# Patient Record
Sex: Female | Born: 1935 | Race: White | Hispanic: No | Smoking: Never smoker
Health system: Southern US, Community
[De-identification: ages and names within clinical notes are randomized; demographics above are authoritative.]

## PROBLEM LIST (undated history)

## (undated) ENCOUNTER — Emergency Department (HOSPITAL_COMMUNITY): Payer: Medicare Other

## (undated) DIAGNOSIS — E871 Hypo-osmolality and hyponatremia: Secondary | ICD-10-CM

## (undated) DIAGNOSIS — D649 Anemia, unspecified: Secondary | ICD-10-CM

## (undated) DIAGNOSIS — E039 Hypothyroidism, unspecified: Secondary | ICD-10-CM

## (undated) DIAGNOSIS — I10 Essential (primary) hypertension: Secondary | ICD-10-CM

## (undated) DIAGNOSIS — I480 Paroxysmal atrial fibrillation: Secondary | ICD-10-CM

## (undated) DIAGNOSIS — D696 Thrombocytopenia, unspecified: Secondary | ICD-10-CM

## (undated) DIAGNOSIS — I517 Cardiomegaly: Secondary | ICD-10-CM

## (undated) DIAGNOSIS — B029 Zoster without complications: Secondary | ICD-10-CM

## (undated) DIAGNOSIS — C801 Malignant (primary) neoplasm, unspecified: Secondary | ICD-10-CM

## (undated) HISTORY — DX: Zoster without complications: B02.9

## (undated) HISTORY — DX: Hypo-osmolality and hyponatremia: E87.1

## (undated) HISTORY — PX: JOINT REPLACEMENT: SHX530

## (undated) HISTORY — DX: Thrombocytopenia, unspecified: D69.6

## (undated) HISTORY — DX: Paroxysmal atrial fibrillation: I48.0

## (undated) HISTORY — PX: ABDOMINAL HYSTERECTOMY: SHX81

## (undated) HISTORY — DX: Hypothyroidism, unspecified: E03.9

## (undated) HISTORY — DX: Cardiomegaly: I51.7

## (undated) HISTORY — DX: Anemia, unspecified: D64.9

---

## 2000-04-12 ENCOUNTER — Inpatient Hospital Stay (HOSPITAL_COMMUNITY): Admission: RE | Admit: 2000-04-12 | Discharge: 2000-04-16 | Payer: Self-pay | Admitting: Orthopedic Surgery

## 2000-10-08 ENCOUNTER — Other Ambulatory Visit: Admission: RE | Admit: 2000-10-08 | Discharge: 2000-10-08 | Payer: Self-pay | Admitting: *Deleted

## 2004-01-22 ENCOUNTER — Ambulatory Visit (HOSPITAL_COMMUNITY): Admission: RE | Admit: 2004-01-22 | Discharge: 2004-01-22 | Payer: Self-pay | Admitting: Gastroenterology

## 2010-01-20 ENCOUNTER — Inpatient Hospital Stay (HOSPITAL_COMMUNITY): Admission: RE | Admit: 2010-01-20 | Discharge: 2010-01-24 | Payer: Self-pay | Admitting: Orthopedic Surgery

## 2010-12-30 LAB — BASIC METABOLIC PANEL
CO2: 27 mEq/L (ref 19–32)
Chloride: 94 mEq/L — ABNORMAL LOW (ref 96–112)
GFR calc non Af Amer: >60 mL/min (ref >60)
Glucose, Bld: 110 mg/dL — ABNORMAL HIGH (ref 70–99)
Potassium: 3.8 mEq/L (ref 3.5–5.1)
Sodium: 128 mEq/L — ABNORMAL LOW (ref 135–145)

## 2010-12-30 LAB — CBC
HCT: 33.5 % — ABNORMAL LOW (ref 36.0–46.0)
Hemoglobin: 11.6 g/dL — ABNORMAL LOW (ref 12.0–15.0)
MCV: 95.9 fL (ref 78.0–100.0)
RDW: 14.6 % (ref 11.5–15.5)

## 2010-12-31 LAB — BASIC METABOLIC PANEL
BUN: 9 mg/dL (ref 6–23)
CO2: 26 mEq/L (ref 19–32)
Calcium: 8 mg/dL — ABNORMAL LOW (ref 8.4–10.5)
Calcium: 8.5 mg/dL (ref 8.4–10.5)
Chloride: 87 mEq/L — ABNORMAL LOW (ref 96–112)
GFR calc Af Amer: >60 mL/min (ref >60)
GFR calc Af Amer: >60 mL/min (ref >60)
GFR calc non Af Amer: >60 mL/min (ref >60)
GFR calc non Af Amer: >60 mL/min (ref >60)
GFR calc non Af Amer: >60 mL/min (ref >60)
Potassium: 3.1 mEq/L — ABNORMAL LOW (ref 3.5–5.1)
Potassium: 3.8 mEq/L (ref 3.5–5.1)
Sodium: 124 mEq/L — ABNORMAL LOW (ref 135–145)
Sodium: 128 mEq/L — ABNORMAL LOW (ref 135–145)
Sodium: 132 mEq/L — ABNORMAL LOW (ref 135–145)

## 2010-12-31 LAB — URINALYSIS, ROUTINE W REFLEX MICROSCOPIC
Bilirubin Urine: NEGATIVE
Ketones, ur: NEGATIVE mg/dL
Nitrite: NEGATIVE
Protein, ur: NEGATIVE mg/dL
Urobilinogen, UA: 0.2 mg/dL (ref 0.0–1.0)

## 2010-12-31 LAB — COMPREHENSIVE METABOLIC PANEL
Albumin: 4.1 g/dL (ref 3.5–5.2)
BUN: 12 mg/dL (ref 6–23)
CO2: 25 mEq/L (ref 19–32)
Chloride: 98 mEq/L (ref 96–112)
Creatinine, Ser: 0.7 mg/dL (ref 0.4–1.2)
GFR calc non Af Amer: >60 mL/min (ref >60)
Total Bilirubin: 0.3 mg/dL (ref 0.3–1.2)

## 2010-12-31 LAB — CBC
HCT: 29.9 % — ABNORMAL LOW (ref 36.0–46.0)
HCT: 32.3 % — ABNORMAL LOW (ref 36.0–46.0)
HCT: 36.7 % (ref 36.0–46.0)
Hemoglobin: 10.1 g/dL — ABNORMAL LOW (ref 12.0–15.0)
Hemoglobin: 11.2 g/dL — ABNORMAL LOW (ref 12.0–15.0)
Hemoglobin: 8.3 g/dL — ABNORMAL LOW (ref 12.0–15.0)
MCHC: 34.3 g/dL (ref 30.0–36.0)
MCV: 94.7 fL (ref 78.0–100.0)
MCV: 98.6 fL (ref 78.0–100.0)
Platelets: 353 10*3/uL (ref 150–400)
Platelets: 435 10*3/uL — ABNORMAL HIGH (ref 150–400)
RBC: 2.48 MIL/uL — ABNORMAL LOW (ref 3.87–5.11)
RBC: 3.41 MIL/uL — ABNORMAL LOW (ref 3.87–5.11)
WBC: 10.7 10*3/uL — ABNORMAL HIGH (ref 4.0–10.5)
WBC: 13.6 10*3/uL — ABNORMAL HIGH (ref 4.0–10.5)
WBC: 15.2 10*3/uL — ABNORMAL HIGH (ref 4.0–10.5)
WBC: 16.3 10*3/uL — ABNORMAL HIGH (ref 4.0–10.5)

## 2010-12-31 LAB — TYPE AND SCREEN: Antibody Screen: NEGATIVE

## 2010-12-31 LAB — ABO/RH: ABO/RH(D): A POS

## 2010-12-31 LAB — PROTIME-INR
INR: 1.05 (ref 0.00–1.49)
INR: 1.12 (ref 0.00–1.49)
INR: 2.12 — ABNORMAL HIGH (ref 0.00–1.49)

## 2010-12-31 LAB — APTT: aPTT: 30 seconds (ref 24–37)

## 2011-02-12 ENCOUNTER — Inpatient Hospital Stay (HOSPITAL_COMMUNITY)
Admission: EM | Admit: 2011-02-12 | Discharge: 2011-02-17 | DRG: 372 | Disposition: A | Discharge status: Home / Self Care | Payer: Medicare Other | Attending: Internal Medicine | Admitting: Internal Medicine

## 2011-02-12 ENCOUNTER — Encounter (HOSPITAL_COMMUNITY): Payer: Self-pay | Admitting: Radiology

## 2011-02-12 ENCOUNTER — Emergency Department (HOSPITAL_COMMUNITY): Payer: Medicare Other

## 2011-02-12 ENCOUNTER — Inpatient Hospital Stay (HOSPITAL_COMMUNITY): Payer: Medicare Other

## 2011-02-12 DIAGNOSIS — E871 Hypo-osmolality and hyponatremia: Secondary | ICD-10-CM | POA: Diagnosis present

## 2011-02-12 DIAGNOSIS — E785 Hyperlipidemia, unspecified: Secondary | ICD-10-CM | POA: Diagnosis present

## 2011-02-12 DIAGNOSIS — E039 Hypothyroidism, unspecified: Secondary | ICD-10-CM | POA: Diagnosis present

## 2011-02-12 DIAGNOSIS — K651 Peritoneal abscess: Principal | ICD-10-CM | POA: Diagnosis present

## 2011-02-12 DIAGNOSIS — Z7982 Long term (current) use of aspirin: Secondary | ICD-10-CM

## 2011-02-12 DIAGNOSIS — Z79899 Other long term (current) drug therapy: Secondary | ICD-10-CM

## 2011-02-12 DIAGNOSIS — M810 Age-related osteoporosis without current pathological fracture: Secondary | ICD-10-CM | POA: Diagnosis present

## 2011-02-12 DIAGNOSIS — D509 Iron deficiency anemia, unspecified: Secondary | ICD-10-CM | POA: Diagnosis not present

## 2011-02-12 DIAGNOSIS — M1990 Unspecified osteoarthritis, unspecified site: Secondary | ICD-10-CM | POA: Diagnosis present

## 2011-02-12 DIAGNOSIS — Z96659 Presence of unspecified artificial knee joint: Secondary | ICD-10-CM

## 2011-02-12 DIAGNOSIS — Z8542 Personal history of malignant neoplasm of other parts of uterus: Secondary | ICD-10-CM

## 2011-02-12 DIAGNOSIS — I10 Essential (primary) hypertension: Secondary | ICD-10-CM | POA: Diagnosis present

## 2011-02-12 DIAGNOSIS — I4891 Unspecified atrial fibrillation: Secondary | ICD-10-CM | POA: Diagnosis present

## 2011-02-12 DIAGNOSIS — IMO0002 Reserved for concepts with insufficient information to code with codable children: Secondary | ICD-10-CM | POA: Diagnosis present

## 2011-02-12 DIAGNOSIS — E876 Hypokalemia: Secondary | ICD-10-CM | POA: Diagnosis present

## 2011-02-12 DIAGNOSIS — E869 Volume depletion, unspecified: Secondary | ICD-10-CM | POA: Diagnosis present

## 2011-02-12 HISTORY — DX: Malignant (primary) neoplasm, unspecified: C80.1

## 2011-02-12 HISTORY — DX: Essential (primary) hypertension: I10

## 2011-02-12 LAB — BASIC METABOLIC PANEL
BUN: 14 mg/dL (ref 6–23)
Chloride: 94 mEq/L — ABNORMAL LOW (ref 96–112)
GFR calc non Af Amer: >60 mL/min (ref >60)
Potassium: 3.1 mEq/L — ABNORMAL LOW (ref 3.5–5.1)
Sodium: 132 mEq/L — ABNORMAL LOW (ref 135–145)

## 2011-02-12 LAB — DIFFERENTIAL
Lymphocytes Relative: 27 % (ref 12–46)
Monocytes Absolute: 1.3 10*3/uL — ABNORMAL HIGH (ref 0.1–1.0)
Monocytes Relative: 8 % (ref 3–12)
Neutro Abs: 9.3 10*3/uL — ABNORMAL HIGH (ref 1.7–7.7)
Neutrophils Relative %: 62 % (ref 43–77)

## 2011-02-12 LAB — MAGNESIUM: Magnesium: 2.4 mg/dL (ref 1.5–2.5)

## 2011-02-12 LAB — COMPREHENSIVE METABOLIC PANEL
ALT: 32 U/L (ref 0–35)
AST: 33 U/L (ref 0–37)
Albumin: 3.3 g/dL — ABNORMAL LOW (ref 3.5–5.2)
CO2: 26 mEq/L (ref 19–32)
Calcium: 10 mg/dL (ref 8.4–10.5)
Chloride: 82 mEq/L — ABNORMAL LOW (ref 96–112)
Creatinine, Ser: 0.73 mg/dL (ref 0.4–1.2)
GFR calc Af Amer: >60 mL/min (ref >60)
GFR calc non Af Amer: >60 mL/min (ref >60)
Sodium: 129 mEq/L — ABNORMAL LOW (ref 135–145)

## 2011-02-12 LAB — CBC
HCT: 39.7 % (ref 36.0–46.0)
Hemoglobin: 14.1 g/dL (ref 12.0–15.0)
MCH: 32.8 pg (ref 26.0–34.0)
MCHC: 35.5 g/dL (ref 30.0–36.0)
RBC: 4.3 MIL/uL (ref 3.87–5.11)

## 2011-02-12 LAB — POCT CARDIAC MARKERS
Myoglobin, poc: 252 ng/mL (ref 12–200)
Troponin i, poc: <0.05 ng/mL (ref 0.00–0.09)

## 2011-02-12 LAB — TROPONIN I: Troponin I: 0.38 ng/mL (ref <0.30)

## 2011-02-12 LAB — CK TOTAL AND CKMB (NOT AT ARMC): CK, MB: 5.1 ng/mL — ABNORMAL HIGH (ref 0.3–4.0)

## 2011-02-12 LAB — TSH: TSH: 1.006 u[IU]/mL (ref 0.350–4.500)

## 2011-02-12 MED ORDER — IOHEXOL 300 MG/ML  SOLN
100.0000 mL | Freq: Once | INTRAMUSCULAR | Status: AC | PRN
  Administered 2011-02-12: 100 mL via INTRAVENOUS

## 2011-02-13 DIAGNOSIS — I517 Cardiomegaly: Secondary | ICD-10-CM

## 2011-02-13 LAB — CBC
HCT: 29.6 % — ABNORMAL LOW (ref 36.0–46.0)
MCH: 32.5 pg (ref 26.0–34.0)
MCHC: 34.5 g/dL (ref 30.0–36.0)
MCV: 94.3 fL (ref 78.0–100.0)
MCV: 94.5 fL (ref 78.0–100.0)
Platelets: 631 10*3/uL — ABNORMAL HIGH (ref 150–400)
Platelets: 694 10*3/uL — ABNORMAL HIGH (ref 150–400)
RBC: 3.27 MIL/uL — ABNORMAL LOW (ref 3.87–5.11)
RDW: 12.6 % (ref 11.5–15.5)
RDW: 12.4 % (ref 11.5–15.5)
WBC: 12.3 10*3/uL — ABNORMAL HIGH (ref 4.0–10.5)
WBC: 12.2 10*3/uL — ABNORMAL HIGH (ref 4.0–10.5)

## 2011-02-13 LAB — BASIC METABOLIC PANEL
BUN: 5 mg/dL — ABNORMAL LOW (ref 6–23)
Chloride: 97 mEq/L (ref 96–112)
Creatinine, Ser: <0.47 mg/dL (ref 0.4–1.2)
Potassium: 3.9 mEq/L (ref 3.5–5.1)

## 2011-02-13 LAB — COMPREHENSIVE METABOLIC PANEL
ALT: 19 U/L (ref 0–35)
Alkaline Phosphatase: 57 U/L (ref 39–117)
BUN: 7 mg/dL (ref 6–23)
CO2: 26 mEq/L (ref 19–32)
Chloride: 95 mEq/L — ABNORMAL LOW (ref 96–112)
Glucose, Bld: 112 mg/dL — ABNORMAL HIGH (ref 70–99)
Potassium: 2.8 mEq/L — ABNORMAL LOW (ref 3.5–5.1)
Sodium: 132 mEq/L — ABNORMAL LOW (ref 135–145)
Total Bilirubin: 0.2 mg/dL — ABNORMAL LOW (ref 0.3–1.2)
Total Protein: 5.5 g/dL — ABNORMAL LOW (ref 6.0–8.3)

## 2011-02-13 LAB — CARDIAC PANEL(CRET KIN+CKTOT+MB+TROPI)
Relative Index: INVALID (ref 0.0–2.5)
Troponin I: 0.34 ng/mL (ref <0.30)

## 2011-02-14 LAB — CBC
HCT: 34.4 % — ABNORMAL LOW (ref 36.0–46.0)
HCT: 32.5 % — ABNORMAL LOW (ref 36.0–46.0)
Hemoglobin: 11 g/dL — ABNORMAL LOW (ref 12.0–15.0)
MCV: 94.8 fL (ref 78.0–100.0)
Platelets: 717 10*3/uL — ABNORMAL HIGH (ref 150–400)
RBC: 3.65 MIL/uL — ABNORMAL LOW (ref 3.87–5.11)
RDW: 12.5 % (ref 11.5–15.5)
WBC: 18.9 10*3/uL — ABNORMAL HIGH (ref 4.0–10.5)
WBC: 14 10*3/uL — ABNORMAL HIGH (ref 4.0–10.5)

## 2011-02-14 LAB — BASIC METABOLIC PANEL
Chloride: 99 mEq/L (ref 96–112)
Potassium: 4 mEq/L (ref 3.5–5.1)
Sodium: 131 mEq/L — ABNORMAL LOW (ref 135–145)

## 2011-02-15 LAB — CBC
HCT: 33.2 % — ABNORMAL LOW (ref 36.0–46.0)
HCT: 32.3 % — ABNORMAL LOW (ref 36.0–46.0)
Hemoglobin: 11.5 g/dL — ABNORMAL LOW (ref 12.0–15.0)
MCH: 32.8 pg (ref 26.0–34.0)
MCH: 32.6 pg (ref 26.0–34.0)
MCHC: 34.4 g/dL (ref 30.0–36.0)
MCV: 94.6 fL (ref 78.0–100.0)
MCV: 94.7 fL (ref 78.0–100.0)
RBC: 3.51 MIL/uL — ABNORMAL LOW (ref 3.87–5.11)
RDW: 12.5 % (ref 11.5–15.5)
WBC: 13.6 10*3/uL — ABNORMAL HIGH (ref 4.0–10.5)

## 2011-02-16 LAB — HEMOCCULT GUIAC POC 1CARD (OFFICE): Fecal Occult Bld: NEGATIVE

## 2011-02-16 LAB — APTT: aPTT: 36 seconds (ref 24–37)

## 2011-02-17 ENCOUNTER — Inpatient Hospital Stay (HOSPITAL_COMMUNITY): Payer: Medicare Other

## 2011-02-17 LAB — CBC
Hemoglobin: 11.6 g/dL — ABNORMAL LOW (ref 12.0–15.0)
MCH: 32.2 pg (ref 26.0–34.0)
MCHC: 34.2 g/dL (ref 30.0–36.0)
Platelets: 547 10*3/uL — ABNORMAL HIGH (ref 150–400)
RDW: 12.6 % (ref 11.5–15.5)

## 2011-02-17 LAB — BASIC METABOLIC PANEL
Calcium: 8.8 mg/dL (ref 8.4–10.5)
Creatinine, Ser: <0.47 mg/dL (ref 0.4–1.2)
Sodium: 126 mEq/L — ABNORMAL LOW (ref 135–145)

## 2011-02-19 NOTE — Consult Note (Signed)
NAME:  Dominique [name]                 ACCOUNT NO.:  [id number]  MEDICAL RECORD NO.:  [id number]           PATIENT TYPE:  LOCATION:                                 FACILITY:  PHYSICIAN:  Wilmon Arms. Corliss Skains, M.D. DATE OF BIRTH:  September 14, 1933  DATE OF CONSULTATION:  02/12/2011 DATE OF DISCHARGE:                                CONSULTATION   REQUESTING PHYSICIAN:  Larina Earthly, MD  REASON FOR CONSULTATION:  Possible pelvic abscess.  HISTORY OF PRESENT ILLNESS:  [name] is a pleasant 77 year old female has had some lower abdominal discomfort and intermittent bouts of diarrhea and constipation over the past 2-3 weeks.  She has had no fever, no changes in her appetite.  She states there has been no blood in her bowel movements.  She denies any pneumaturia.  She denies any vaginal discharge.  She was set up for a CT scan as an outpatient and this apparently showed suggestion of a pelvic abscess, possibly related to diverticular disease.  She had a previous colonoscopy I believe in 2006 that showed no evidence of diverticular process.  Dr. Felipa Eth apparently discussed this with Dr. Michaell Cowing who helped arrange an outpatient percutaneous drainage.  However, the patient states on the way to the hospital for that procedure she developed acute onset of chest pain, jaw pain, and rapid heart rate with palpitations.  She was sent from radiology department to the emergency department and was found to be in atrial fibrillation with rapid ventricular response which was new onset.  She was seen in the ER and treated for that.  We were asked to consult on the patient based on her previous CT scan findings of a possible pelvic abscess.  However, upon review with the radiology team of the initial CT there was concern that this does not appear to be an inflammatory process or definitive abscess and therefore further imaging would be necessary.  PAST MEDICAL HISTORY: 1. Hyperlipidemia. 2. Osteoarthritis. 3.  Hypertension. 4. History of uterine cancer. 5. History of questionable thyroid disease.  SURGICAL HISTORY:  The patient has had a total abdominal hysterectomy and BSO for her previously mentioned uterine cancer.  She has had bilateral total knee arthroplasty as well in the past.  FAMILY HISTORY:  Noncontributory at present case.  SOCIAL HISTORY:  The patient is married, living at home.  She denies any alcohol, tobacco, or illicit drug use.  As listed, allergies include CODEINE, DARVOCET, PERCOCET, and DEMEROL.  REVIEW OF SYSTEMS:  Please see history of present illness for pertinent findings.  PHYSICAL EXAMINATION:  GENERAL:  A 77 year old female who does not appear in any acute distress at present time. CURRENT VITAL SIGNS:  Temperature of 97.8, heart rate anywhere from 77 and presenting heart rate of 180 and regular.  Blood pressure 118/59, respiratory rate of 18. ENT:  Unremarkable. NECK:  Supple without lymphadenopathy.  Trachea is midline.  No thyromegaly or masses. LUNGS:  Clear. HEART:  Irregular rate and rhythm but no murmurs, gallops, rubs. Carotids 2+ and brisk without bruit.  Peripheral pulses intact and symmetrical. ABDOMEN:  The patient has normal bowel sounds.  She is nondistended. She is tender in the suprapubic region but no mass effect is appreciated. RECTAL:  Deferred at present time.  The patient is alert and oriented.  DIAGNOSTICS:  CBC shows a white blood cell count of 15.1, hemoglobin of 14.1, hematocrit of 39.7, platelet count of 808.  Metabolic panel shows a sodium of 129, potassium of 2.2, chloride of 82, CO2 of 25, BUN of 15, creatinine of 0.7.  Liver enzymes within normal limits.  IMAGING:  Chest x-ray shows no acute disease process.  No obvious pneumoperitoneum is evident.  A CT scan with IV and rectal contrast has been ordered but is yet to be performed.  IMPRESSION: 1. New onset atrial fibrillation. 2. Chest pain. 3. Abdominal pain of  uncertain etiology. 4. Questionable inflammatory process in the pelvis pending CT. 5. History of uterine cancer.  PLAN:  We will agree to follow the CT scan report for the possibility of inflammatory or abscess process in the pelvis.  Agree with current IV antibiotics.  We would recommend bowel rest until definitive diagnosis is achieved.  We will happily follow along with you.     Brayton El, PA-C   ______________________________ Wilmon Arms. Corliss Skains, M.D.    KB/MEDQ  D:  02/13/2011  T:  02/13/2011  Job:  [id number]  Electronically Signed by Brayton El  on 02/16/2011 04:00:15 PM Electronically Signed by Manus Rudd M.D. on 02/19/2011 08:20:03 AM

## 2011-02-27 NOTE — Op Note (Signed)
[facility]  Patient:    Dominique [name], Dominique [name]                        MRN: [id number] Proc. Date: 04/12/00 Adm. Date:  [id number] Attending:  Ollen Gross V                           Operative Report  PREOPERATIVE DIAGNOSIS:  Osteoarthritis, left knee.  POSTOPERATIVE DIAGNOSIS:  Osteoarthritis, left knee.  PROCEDURE:  Left total knee arthroplasty.  SURGEON:  Ollen Gross, M.D.  ASSISTANT:  Druscilla Brownie. Shela Nevin, P.A.  ANESTHESIA:  General.  ESTIMATED BLOOD LOSS:  Minimal.  DRAINS:  Hemovac x 1.  TOURNIQUET TIME:  Fifty-three minutes at 350 mmHg.  COMPLICATIONS:  None.  CONDITION:  Stable, to recovery.  BRIEF CLINICAL NOTE:  Ms. Starkel is a 77 year old female with severe osteoarthritis of her left knee who has had symptoms refractory to nonoperative management.  She presents now for left total knee arthroplasty.  PROCEDURE IN DETAIL:  After the successful administration of general anesthetic, a tourniquet is placed high on the left thigh and left lower extremity prepped and draped in the usual sterile fashion.  Extremity is wrapped with an Esmarch, knee flexed and tourniquet inflated to 350 mmHg.  A standard midline incision is made and skin cut with a 10 blade through subcutaneous tissue to the level of the extensor mechanism.  A fresh blade is used to make a medial parapatellar arthrotomy.  Soft tissue over the proximal medial tibia is subperiosteally elevated to the joint line with a knife and into semimembranosus bursa with curved osteotome.  Soft tissue over the proximal lateral tibia is also elevated with attention being paid to avoiding the patellar tendon on the tibial tubercle.  Lateral patellofemoral ligament is then cut, patella everted and the knee flexed to 90 degrees.  ACL and PCL are then removed.  The drill is then used to create a starting hole in the distal femur for placement of the alignment rod.  A 5 degree left valgus  alignment rod is placed and referencing off the posterior condyle, rotation is marked and the block is pinned so as to remove 9 mm off the distal femur.  Distal femur resection is then made and the sizing block placed.  Size 3 is the most appropriate.  The rotation is marked and it corresponds with the epicondylar axis.  Size 3 cutting block is placed and the anterior and posterior cuts are made.  The tibia is then subluxated forward and the menisci removed.  Extramedullary tibial alignment guide is placed and referencing proximally at the medial third tibial tubercle and then distally along the second metatarsal axis, the block is subsequently pinned so as to remove 10 mm off the nondeficient lateral side.  The intercondylar block is then placed on the femur and intercondylar and chamfer cuts made.  Trial femur and trial tibia, both size 3s, are then placed with a 10-mm posterior-stabilized rotating platform insert.  She had excellent stability in full extension all the way down past 100 degrees of flexion.  There was excellent balance throughout.  At this point, the patella is everted, thickness is measured to be 22 and freehand resection taken down to 12 mm.  Lug holes are then drilled for the 38-mm template, trials placed and she easily tracks well.  The proximal tibia is then prepared with the modular drill and Carolin Guernsey  punch and the osteophytes removed from the posterior femur.  At this point, the cut bone surfaces are prepared with pulsatile lavage and cement mixed.  Once ready for implantation, the tibia, the femur and patellar are cemented into place.  A trial 10-mm insert is placed, the knee held with full extension and all extruded cement removed.  The patella tracks normally once the components are fully cemented into place.  The permanent 10-mm posterior stabilized rotating platform insert is placed into the size 3 tibial tray and once again, there is excellent balance and stability  throughout.  The wound is then copiously irrigated with antibiotic solution and the extensor mechanism closed over one limb of a Hemovac drain.  Tourniquet is released with a total time of 53 minutes.  Subcu is closed in two layers with interrupted 2-0 Vicryl, subcuticular closed with running 4-0 Monocryl.  Incision is clean and dry and Steri-Strips and bulky sterile dressing applied.  Patient is then placed into a knee immobilizer, awakened and transported to recovery in stable condition.DD:  04/12/00 TD:  04/12/00 Job: [id number]

## 2011-02-27 NOTE — Discharge Summary (Signed)
[facility]  Patient:    Dominique [name], Dominique [name]                        MRN: [id number] Adm. Date:  [id number] Disc. Date: [id number] Attending:  Loanne Drilling Dictator:   Marveen Reeks Dasnoit, P.A.                           Discharge Summary  ADMISSION DIAGNOSES: 1.  End-stage osteoarthritic degeneration of the left knee. 2.  Hypertension. 3.  History of uterine cancer. 4.  Postmenopausal.  DISCHARGE DIAGNOSES: 1.  End-stage osteoarthritic degenerative of the left knee 2.  Hypertension. 3.  History of uterine cancer. 4.  Postmenopausal. 5.  Postoperative blood loss anemia, requiring transfusion.  OPERATION PERFORMED:  Left total knee replacement.  CONSULTATIONS:  None.  HISTORY:  The patient is a 77 year old female who has been seen and evaluated by Dr. Ollen Gross for continued left knee pain.  She was referred via the care of Dr. Janey Genta for consideration of left knee arthroplasty.  She has been having approximately a six- to eight-year history of left knee pain.  She has undergone conservative measures with analgesics, medications by mouth, intra-articular injections, including ______ as well as cortisone injections. But the pain has been refractory to these measures.  The pain is increasing to the point where it has started to interfere with her activities of daily living.  X-rays in our office revealed end-stage osteoarthritic degeneration throughout all three compartments.  We discussed her treatment options with her at length.  It was decided that she would be best served undergoing a left total knee replacement.  The procedure risks, benefits, complications and postoperative course were discussed with the patient at length.  She was in agreement with this plan.  LAB DATA:  Preoperative labs revealed a hemoglobin of 13.9 with a hematocrit of 41.8, white blood cell count of 11.5 and 608,000 platelets.  Coag studies within normal limits.   Chemistry profile was entirely normal.  Urinalysis revealed a trace of ketones, but was otherwise normal.  Preoperative EKG revealed a normal sinus rhythm with a normal axis.  HOSPITAL COURSE:  The patient was admitted after undergoing left total knee replacement by Dr. Ollen Gross.  This procedure was done under general anesthesia.  Tourniquet time of 53 minutes, minimal blood loss. Postoperatively, the patient was placed on Ancef for three doses.  Pain was controlled with morphine by PCA.  She was started on two-minute per the pharmacy protocol for DVT prophylaxis, and her physical therapy was started at weight-bearing as tolerated.  First day post-op, hemoglobin was 10.1.  She was afebrile.  She was doing well overall.  Moderate pain which was well-controlled with p.o. and PCA meds.   On exam, her dressing was dry. Hemovac revealed minimal drainage; it was, therefore, removed.  Her calf was soft.  Abdomen was soft with good bowel sounds.  At this time, her ______ revealed a slightly low sodium; and we, therefore, changed her IV fluids to normal saline and added potassium supplement.  Her I&O balance was +2,000; and we, therefore, treated her with one dose of IV Lasix.  The following day, on April 14, 2000, the patient was feeling weak.  Hemoglobin was 8.6, from a pre-op level of 13.9.  Discussed transfusion with the patient.  This was okay.  This was done without complication.  The following day on April 15, 2000, hemoglobin responded nicely to 11.0 with hematocrit of 32.  She remained afebrile.  She was doing much better; less feelings of light-headedness.  Her physical therapy was progressing nicely.  The wound itself looked very good with minimal swelling.  Neurovascular motor functions were intact.  She was given a laxative of choice on that day.  Her IV was DCd.  The following day on April 16, 2000, she was without complaint.  She was afebrile with stable vital signs. The incision was  clean and dry; no evidence of hemarthrosis.  She did great with her physical therapy, ambulating 120 and then 110 feet with a standard walker.  She went up and down five steps with minimal supervision.  It was, therefore, felt that she was stable and ready for discharge to home.  MEDICATIONS:  The patient was provided with prescriptions for: 1.  Percocet 5 mg #40, one or two p.o. q.4-6h. p.r.n. pain. 2.  Robaxin 500 mg #30 one p.o. q.8h. p.r.n. muscle spasms. 3.  Coumadin per the pharmacy protocol. 4.  She will continue her other medications as prior to admission.  WOUND CARE:  She will change her dressing on a daily basis.  The staples remain in place until the patient is approximately two weeks post-op, at which time they can remove with steri-strips applied as needed.  ACTIVITY:  The patient will be seen by home health physical therapy.  She will also been seen by a home health nurse for a continued check of her protimes. INR will be maintained between 2 and 2.5.  ______ She will return to see Dr. Ollen Gross in our office in approximately 10 days time or sooner.  CONDITION ON DISCHARGE:  Stable and improving.  FINAL DISCHARGE DIAGNOSIS:  End-stage osteoarthritic degeneration of the left knee, requiring a left total knee replacement. DD:  04/16/00 TD:  04/16/00 Job: [id number]

## 2011-02-27 NOTE — H&P (Signed)
[facility]  Patient:    Dominique [name], Dominique [name]                          MRN: [id number] Adm. Date:  04/12/00 Attending:  Trudee Grip, M.D. Dictator:   Nettie Elm, M.D. CC:         Daleen Bo R. Felipa Eth, M.D. [facility],             [address] [place], [place] [address]             Trudee Grip, M.D.                         History and Physical  DATE OF OFFICE VISIT AND HISTORY OF PRESENT ILLNESS:  April 05, 2000  CHIEF COMPLAINT:  Left knee pain.  HISTORY OF PRESENT ILLNESS:  The patient is a 77 year old female who has been seen and evaluated by Dr. Trudee Grip for continued left knee pain.  She was kindly referred over via the care of Dr. Wilmer Floor, for consideration of total knee arthroplasty.  She has been having approximately 7-8 years of pain. She has undergone conservative measures with analgesics, medications by mouth, and also interarticular injections including Synvisc and also cortisone as injections, however, the pain has been refractory to conservative measures. The pain has been increasing to the point where it started to interfere with her daily activities.  She wishes to continue her lifestyle.  She presents now for consideration of total knee replacement and arthroplasty.  Risks and benefits have been discussed with the patient and she has elected to proceed with surgical intervention.  The patient has been seen preoperatively by Dr. Felipa Eth and has been cleared to undergo procedure.  ALLERGIES:  CODEINE causes blisters in her mouth.  CURRENT MEDICATIONS: 1. Zestoretic 20/25 mg 1/2 tablet p.o. q. day. 2. Calcium daily. 3. Magnesium daily. 4. Potassium supplements daily. 5. Phosphorous daily. 6. Vitamin B complex 7. Vitamin E.  PAST MEDICAL HISTORY: 1. Hypertension. 2. Uterine cancer. 3. Post menopausal.  PAST SURGICAL HISTORY: 1. Hysterectomy secondary to uterine CA. 2. Status post cardiac catheterization which  was found to be normal and she    has also had a breast ductogram.  SOCIAL HISTORY:  She is married, and has two sons.  Denies the use of tobacco products or alcohol products.  She lives in a 1-story home with 6 steps entering into the front and 4 steps entering into the back.  FAMILY HISTORY:  Her mother is deceased age 39, with a history of heart disease and MI.  Father deceased age 75 secondary to tuberculosis and a history of stone dust.  He was a stone cutter.  REVIEW OF SYSTEMS:  GENERAL:  The patient does have some hot flashes which she attributes to her postmenopausal condition.  No fevers, chills, or night sweats with the exception of some hot flashes. NEUROLOGIC:  No seizures, syncope or paralysis.  RESPIRATORY:  No shortness of breath, productive cough or hemoptysis.  CARDIOVASCULAR:  No chest pain, angina, orthopnea.  GI:  No nausea, vomiting, or diarrhea.  _________ blood or mucus in the stool.  GU: No dysuria, hematuria, discharge.  MUSCULOSKELETAL: Pertinent at the left knee, family history and present illness.  PHYSICAL EXAMINATION:  VITAL SIGNS:  Pulse 76, respirations 16, blood pressure 138/84.  GENERAL:  The patient is a 77 year old white female, well-nourished well-developed, currently in no acute  distress.  She is alert, oriented and cooperative at the time of exam.  HEENT:  Normocephalic and atraumatic.  Pupils round reactive.  EOMs are intact.  Oropharynx is clear.  She does have partial upper and lower dentures noted.  NECK:  Supple.  No carotid bruits are appreciated.  CHEST:  Clear to auscultation and percussion.  HEART:  Regular rate and rhythm, no murmurs.  S1, S2 noted.  ABDOMEN:  Soft, slightly round, bowel sounds present.  No rebound, no guarding.  RECTAL/BREAST/GENITALIA:  Not done, not pertinent to the present illness.  EXTREMITIES:  ______ to the left lower extremity.  She does have crepitus noted on passive range of motion and the knee  does appear stable on exam. Plus 2 dorsalis pedis pulses noted.  Motor function is intact.  She does have a flexion contracture noted with range of motion of about 20 degrees to about 105 degrees.  X-RAYS:  Demonstrate advanced tricompartmental degenerative changes of the left knee with varus deformity approximately 5 degrees.  IMPRESSION: 1. Osteoarthritis, left knee. 2. Hypertension. 3. Postmenopausal. 4. History of uterine cancer. 5. Status post hysterectomy secondary to uterine cancer.  PLAN:  The patient will be admitted to [facility] to undergo a left total knee replacement arthroplasty.  The patients medical physician is Dr. Felipa Eth.  Dr. Felipa Eth who has cleared the patient preoperatively for surgery will be notified of the room number, admission, and will be consulted if needed for any medical assistance with this patient throughout the hospital course. DD:  04/09/00 TD:  04/09/00 Job: [id number]

## 2011-02-27 NOTE — Op Note (Signed)
NAME:  Dominique [name], Dominique [name]                           ACCOUNT NO.:  [id number]   MEDICAL RECORD NO.:  [id number]                   PATIENT TYPE:  AMB   LOCATION:  ENDO                                 FACILITY:  [facility]   PHYSICIAN:  John C. Madilyn Fireman, M.D.                 DATE OF BIRTH:  [birth year]   DATE OF PROCEDURE:  01/22/2004  DATE OF DISCHARGE:                                 OPERATIVE REPORT   PROCEDURE:  Colonoscopy.   INDICATIONS FOR PROCEDURE:  Colon cancer screening.   PROCEDURE:  The patient was placed in the left lateral decubitus position  and placed on the pulse monitor with continuous low flow oxygen delivered by  nasal cannula.  She was sedated with 50 mcg IV fentanyl and 5 mg IV Versed.  An Olympus video colonoscope is inserted into the rectum and advanced to the  cecum, confirmed by transillumination at McBurney's point and visualization  of the ileocecal valve and appendiceal orifice.  Prep is excellent.  The  cecum, ascending, transverse, descending, and sigmoid colon all appeared  normal with no masses, polyps, diverticula, or other mucosal abnormalities.  The rectum likewise appeared normal, and retroflexed view of the anus  revealed no obvious internal hemorrhoids.  The scope was then withdrawn, and  the patient returned to the recovery room in stable condition.  She  tolerated the procedure well, and there were no immediate complications.   IMPRESSION:  Normal screening colonoscopy.   PLAN:  Next colon screening by sigmoidoscopy or Hemoccult in five years.                                               John C. Madilyn Fireman, M.D.    [facility]/MEDQ  D:  01/22/2004  T:  01/22/2004  Job:  [id number]   cc:   Larina Earthly, M.D.  [address]  [place]  [place] [address]  Fax: [phone]

## 2011-03-04 NOTE — H&P (Signed)
NAME:  Dominique [name], Dominique [name]                 ACCOUNT NO.:  [id number]  MEDICAL RECORD NO.:  [id number]           PATIENT TYPE:  I  LOCATION:  2915                         FACILITY:  [facility]  PHYSICIAN:  Larina Earthly, M.D.        DATE OF BIRTH:  1932/10/17  DATE OF ADMISSION:  02/12/2011 DATE OF DISCHARGE:                             HISTORY & PHYSICAL   CHIEF COMPLAINT:  Palpitations and abdominal pain.  HISTORY OF PRESENT ILLNESS:  This is a 77 year old Caucasian female with past medical history significant for hypertension, iron deficiency anemia, hyperlipidemia, and hypothyroidism who presented to our office on Feb 11, 2011, with a 2-week history of abdominal pain with intermittent diarrhea, weight loss with anorexia without fevers or chills and was able to continue to eat and drink by her report.  CT scan was obtained at [facility] which revealed a 4.5 x 3.5 cm right distal sigmoid abscess extending into the rectal junction with mild ileus, question of diverticular origin.  This was discussed with both General Surgery as well as Interventional Radiology who agreed to place a drain this morning with surgical consult pending for Feb 13, 2011, with Dr. Johna Sheriff.  The patient was started on Cipro and Flagyl and tolerating that in conjunction with a white blood cell count of 16.5.  On the way to the interventional radiology appointment this morning, the patient began having diffuse pain in the tissues and jaw along with significant palpitations and was diverted to the emergency room where she was found to be in a new onset atrial fibrillation with rapid ventricular response associated with a potassium of 2.2, sodium 129, and a white blood cell count of 15.1.  The patient was started on IV fluids along with calcium channel blocker drip.  She spontaneously converted to sinus rhythm, however, was seen by [facility] and started her on oral beta-blockers in conjunction with potassium  supplementation, repeat labs, cardiac enzymes, echocardiogram, and chest x-ray.  Interventional Radiology also saw the patient, reviewed the CT scan from [facility] and was unable to discern fully the abscess cavity and recommended a repeat CT scan with rectal contrast and IV contrast and then possible reevaluation for drain placement on Feb 13, 2011.  Currently, the patient is without palpitations or pain in the upper torso or jaw area, but continues to have abdominal discomfort and continues to have some mild diarrhea without blood and overall feels generally weak, but denies any nausea, vomiting, fevers, or chills and is currently hemodynamically stable.  REVIEW OF SYSTEMS:  Negative for fevers or chills.  Positive for anorexia and generalized weakness.  Negative for swallowing difficulties, reflux, shortness of breath, or chest pain except for the chest discomfort earlier this morning with her palpitations.  Positive for abdominal discomfort.  No nauseam but positive for diarrhea that has been intermittent without blood and some intervening constipation.  The patient states that her current symptomatology may have been predated 2 weeks ago by exposure to some suspicious stew that was obtained at an outside restaurant.  The patient's husband also subsequently had some issues with reflux, but  this resolved with conservative management. Negative for new neurological deficits, lower extremity edema, or new musculoskeletal issues.  PAST MEDICAL HISTORY:  Significant for hypertension, hyperlipidemia, hypothyroidism, and history of uterine cancer, is followed by Dr. Earlene Plater of Gynecology.  The patient has a negative Cardiolite dating back to January 2003.  Also has an established diagnosis of degenerative disk disease and degenerative joint disease, followed by Dr. Ranell Patrick and Dr. Lequita Halt.  Also has a history of seeing Neurosurgery with Dr. Newell Coral. Also has been diagnosed with  osteoporosis and her last colonoscopy was in 2005 with Dr. Madilyn Fireman of [facility] which was reportedly normal.  SURGICAL HISTORY:  Left knee replacement in 2001, right knee replacement in 2011, total abdominal hysterectomy and bilateral salpingo- oophorectomy secondary to uterine cancer, history of cardiac cath which was normal, and history of breast surgery.  FAMILY HISTORY:  Significant for father who died at the age of 48 of TB, mother who died at the age of 81 of MI, otherwise no family history for cancer, early coronary artery disease, or diabetes.  The patient does have one brother.  SOCIAL HISTORY:  The patient is married to Roe Coombs, has 2 sons, worked previously as a Interior and spatial designer.  Denies any tobacco or alcohol use.  CURRENT MEDICATIONS: 1. Multivitamin 1 p.o. daily. 2. Aspirin 81 mg daily. 3. Hydrochlorothiazide 25 mg daily. 4. Zocor 20 mg daily. 5. Synthroid 50 mcg daily. 6. Alendronate 70 mg each week. 7. Calcium 600 mg twice daily. 8. Glucosamine over the counter 1 p.o. daily. 9. Metoprolol succinate 50 mg extended release 1 p.o. daily. 10.She was started on Cipro and Flagyl within the last 24 hours.  ALLERGIES OR SENSITIVITIES: 1. CODEINE. 2. DARVOCET. 3. DEMEROL. 4. PHENERGAN. 5. ACE INHIBITORS. 6. EVISTA, questionable. 7. PERCOCET.  CURRENT LABORATORY DATA:  EKG reveals atrial fibrillation with rapid ventricular response and a rate of approximately 168, currently telemetry revealing sinus rhythm.  White blood cell count 15.1, hemoglobin 14.1, hematocrit 39.7%, and platelet count 808.  Cardiac enzymes x1 negative.  Sodium 129, potassium 2.2, chloride 82, carbon dioxide 26, glucose 151, BUN 15, creatinine 0.73, calcium 10.0, total protein 7.8, albumin 3.3, AST 33, ALT 32, alkaline phosphatase 90, and total bilirubin 0.4.  PHYSICAL EXAMINATION:  GENERAL:  We have a pleasant Caucasian female, lying flat in bed, answering all questions appropriately. VITAL SIGNS:   Heart rate 87 and sinus rhythm by telemetry, oxygen saturation 100%, respirations 22 and nonlabored, and blood pressure 111/63. HEENT:  Sclerae are anicteric.  Extraocular movements are intact. NECK:  Supple.  There is no cervical lymphadenopathy.  There are no oropharyngeal lesions. LUNGS:  Clear to auscultation bilaterally. CARDIOVASCULAR:  Regular rate and rhythm with no murmurs, rubs, or gallops appreciated.  There is no cervical or axillary lymphadenopathy. ABDOMEN:  Soft, but diffusely tender abdomen with no discernible rebound.  Bowel sounds appear present. EXTREMITIES:  No edema.  Pedal pulses are intact.  No evidence of cyanosis or clubbing. NEUROLOGICAL:  Grossly nonfocal.  ASSESSMENT/PLAN: 1. Rectal/sigmoid abscess.  We will recheck CT scan with rectal and IV     contrast and keep on clear fluids for now.  We will provide IV     antibiotics consisting of Unasyn, IV fluids and we will pursue     continuation of interventional radiology and surgical consults for     possible drainage placement and further management. 2. New-onset atrial fibrillation in the setting of volume depletion,     hypokalemia, and hyponatremia, now spontaneously resolved.  We will     continue beta-blockers ordered by Cardiology, recheck electrolytes    and echocardiogram, and serial cardiac enzymes are pending.  TSH is     pending.  Currently hemodynamically stable. 3. Hypertension, on beta-blocker, currently stable. 4. Hypokalemia, on repletion.  We will recheck and further repletion     per already ordered by Cardiology. 5. Hypothyroidism, currently on 50 mcg a day.  We will continue and     check TSH.  We will provide DVT prophylaxis consisting of SCDs for     now, however, may change to subcu Lovenox per Surgery's discretion     and plan of action.     Larina Earthly, M.D.     RA/MEDQ  D:  02/12/2011  T:  02/13/2011  Job:  [id number]  cc:   [facility] Interventional Radiology [facility]  Electronically Signed by Larina Earthly M.D. on 03/04/2011 06:01:53 PM

## 2011-03-04 NOTE — Consult Note (Signed)
NAME:  Dominique [name], Dominique [name]                 ACCOUNT NO.:  [id number]  MEDICAL RECORD NO.:  [id number]           PATIENT TYPE:  I  LOCATION:  2915                         FACILITY:  [facility]  PHYSICIAN:  Bevelyn Buckles. Salima Rumer, MDDATE OF BIRTH:  1932-12-25  DATE OF CONSULTATION:  02/12/2011 DATE OF DISCHARGE:                                CONSULTATION   The patient is new to Cardiology.  PRIMARY CARE PHYSICIAN:  Unknown.  ORTHOPEDIST:  Ollen Gross, MD  REASON FOR CONSULTATION:  New onset atrial fibrillation with rapid ventricular response.  HISTORY OF PRESENT ILLNESS:  Dominique [name] is a 78 year old Caucasian female with no cardiac history, but known history of hypertension, dyslipidemia, thyroid disease, and history of osteoarthritis for which she had total right knee arthroplasty in April 2011, noted to have postoperative hyponatremia down to 100.4 and hypokalemia at 3.2, who presents with new-onset atrial fibrillation in the setting of colonic abscesses and scheduled interventional radiology drainage day, subsequently found to have sodium of 129, potassium of 2.2 with constipation and diarrhea also noted over the last 2 weeks.  Dominique [name] was in her usual state of health until 2 weeks ago when she started experiencing bouts of diarrhea and constipation that had been going on and off ever since.  Unfortunately this did not abate and she was evaluated with subsequent imaging revealing a colonic abscess.  The patient was scheduled to have interventional radiology drain abscess this morning.  When she left her house, she was not feeling well and described shortness of breath, palpitations, tingling in her fingers. She continued to feel worse and worse with culmination bilateral jaw pain, and just generally feeling terrible, and when her husband described these symptoms to staff at hospital where she was awaiting her procedure, they instructed him to take her to the ED immediately.  Here in the  emergency department, the patient noted to be in atrial fibrillation with RVR into the 180s and for some reason was given adenosine.  Question perhaps if she was in supraventricular tachycardia at some point.  At any rate eventually given IV diltiazem and rate controlled with spontaneous conversion, now maintaining normal sinus rhythm.  EKG does show 1-3 mm ST depression in lateral leads.  No Q- waves.  No other significant ST changes.  Normal axis, likely left ventricular hypertrophy.  Followup tracing and normal sinus shows no significant ST-T wave changes at a rate of 88 bpm, question LVH.  Other than heart rate, vital signs have been within normal limits and stable except for mild hypertension with peak systolic at 122 over peak diastolic of 109.  Labs remarkable for white count of 15.1, platelets at 808, and electrolytes abnormality described above.  Point-of-care markers negative x1.  Chest x-ray pending.  Currently, the patient feels significantly better, although not completely back to her baseline, mostly just 50.  PAST MEDICAL HISTORY: 1. Hypertension. 2. Osteoarthritis.     a.     S/P total right knee arthroplasty April 2011 (complicated by      postoperative hyponatremia to sodium nadir of 124 and hypokalemia      to  potassium nadir of 3.2). 3. Thyroid disease. 4. History of shingles. 5. Dyslipidemia, question hyperlipidemia (on simvastatin). 6. History of uterine cancer.     a.     S/P TAH and BSO 20 years ago. 7. Impaired vision.  SOCIAL HISTORY:  The patient lives in [place] with her husband.  She is retired Sales executive.  No significant tobacco, EtOH, or illicit drug use.  No herbal meds.  Regular diet.  No regular exercise, but active in daily life and independent.  FAMILY HISTORY:  Noncontributory secondary to the patient's advanced age.  REVIEW OF SYSTEMS:  Please see HPI.  No orthopnea, no PND, no lower extremity edema, no nausea/vomiting,  fevers/chills.  The patient does have some abdominal pain over the last 2 weeks with her change of bowel habits.  All other systems were reviewed and were negative.  CODE STATUS:  Full.  ALLERGIES/INTOLERANCES: 1. CODEINE. 2. DARVOCET. 3. PERCOCET. 4. DEMEROL.  MEDICATIONS: 1. Lopressor 25 mg p.o. daily. 2. Hydrochlorothiazide 25 mg p.o. daily. 3. Simvastatin 20 mg p.o. at bedtime. 4. Levothroid 50 mcg p.o. daily. 5. Alendronate 70 mg weekly. 6. Cardizem drip in the emergency department.  In the emergency department, the patient given adenosine, potassium p.o. and IV, Cardizem drip initially.  PHYSICAL EXAMINATION:  VITAL SIGNS:  Temperature 97.9 degrees Fahrenheit with BP 123-142/82-109, pulse 88 (NSR) to 180s AFib, respiratory rate 16- 20, O2 saturation 100% on room air. GENERAL:  The patient is alert and oriented x3 in no apparent distress, able to speak easily in full sentences without respiratory distress including with head of bed at approximately 10 degrees in one pillow. HEENT:  Head is normocephalic, atraumatic.  Pupils equal, round, and reactive to light.  Extraocular muscles are intact.  Nares patent without discharge.  Oropharynx without erythema or exudate. NECK:  Supple without lymphadenopathy.  No thyromegaly.  Mild JVD with head of bed as above. HEART:  Rate regular with audible S1 and S2, 1/6 systolic murmur. Pulses are 2+ and equal in both upper and lower extremities bilaterally. LUNGS:  With crackles on the right base approximately half the way up. Rest of right side and left clear to auscultation. SKIN:  No rashes, lesions, or petechiae. ABDOMEN:  Soft, nontender, and nondistended.  Normal abdominal bowel sounds.  No rebound or guarding.  No hepatosplenomegaly. EXTREMITIES:  Without clubbing, cyanosis, or edema. MUSCULOSKELETAL:  Without joint deformity or effusions.  No spinal or CVA tenderness. NEURO:  Cranial nerves II through XII grossly intact.   Strength 5/5 in all extremities and axial groups.  Normal sensation throughout.  Normal cerebellar function (as assessed in the hospital bed only).  RADIOLOGY:  Chest x-ray pending.  EKG:  Original tracing shows AFib with RVR at 168 bpm with 1-3 mm ST depression in lateral leads with normal axis.  No evidence of significant Q-waves, likely LVH, QRS 74, and QTC of 458 and followup tracing shows normal sinus rhythm without significant ST-T wave changes and question of LVH.  LABORATORY DATA:  WBC is 15.1, HGB 14.1, HCT 39.7, PLT count is 808. Sodium 129, potassium 2.2, chloride 82, bicarb 26, BUN 15, creatinine 0.73, AST 32, ALT 33.  Point-care-markers negative x1.  ASSESSMENT/PLAN:  The patient initially seen by Lenard Galloway, PA-C and then seen and thoroughly assessed by attending cardiologist/CHF expert, Dr. Arvilla Meres.  Ms. Kiger is a 77 year old Caucasian female with no cardiac history, but known history of hypertension, hyper/dyslipidemia, thyroid disease, and known history of hyponatremia and hypokalemia  who presents with new onset PAF with RVR now spontaneously converted to normal sinus rhythm in the setting of colonic abscess (constipation/diarrhea) and resulting electrolyte imbalance.  The patient is now back in normal sinus rhythm, being admitted for management of colonic abscess.  We would continue beta blockade therapy (increase Lopressor 25 mg p.o. q.8 h.), check a 2-D echocardiogram, TSH, cycle cardiac enzymes, and would suspect elevation secondary to demand ischemia.  Once colonic issues are straightens out, we would initiate anticoagulation on Coumadin given a CHAD-VASC score of 4 prior to knowledge of LVEF and vascular disease.  Unless significant cardiac enzyme elevation we would proceed with drainage/colonic abscess procedures without inpatient workup and would complete an outpatient Myoview.  We will continue to follow closely.     Jarrett Ables,  PAC   ______________________________ Bevelyn Buckles. Gwendolen Hewlett, MD    MS/MEDQ  D:  02/12/2011  T:  02/13/2011  Job:  [id number]  Electronically Signed by Jarrett Ables PAC on 02/18/2011 01:43:35 PM Electronically Signed by Arvilla Meres MD on 03/04/2011 09:37:54 PM

## 2011-03-04 NOTE — Discharge Summary (Signed)
NAME:  Dominique [name], Dominique [name]                 ACCOUNT NO.:  [id number]  MEDICAL RECORD NO.:  [id number]           PATIENT TYPE:  I  LOCATION:  3712                         FACILITY:  [facility]  PHYSICIAN:  Larina Earthly, M.D.        DATE OF BIRTH:  08-06-1933  DATE OF ADMISSION:  02/12/2011 DATE OF DISCHARGE:  02/17/2011                              DISCHARGE SUMMARY   DISCHARGE DIAGNOSES: 1. Pelvic mass thought to be most consistent with abscess and/or     phlegmon, now resolved by radiology evaluation as mentioned below     with empiric antibiotics.  So, no need for biopsy or drainage.     Colonoscopy unremarkable.  The patient will be discharged on oral     antibiotics in afebrile state. 2. Atrial fibrillation with rapid ventricular response, now     spontaneously resolved with a calcium channel blocker.  The patient     is hemodynamically stable. 3. Positive cardiac enzymes thought to be secondary to rate-induced     ischemia with no evidence of acute coronary syndrome.     Echocardiogram unremarkable. 4. Hypokalemia, resolved. 5. Hyponatremia to be induced by significant IV fluids and diarrhea to     be rechecked on an outpatient basis. 6. Hypertension. 7. Hypothyroidism, clinically compensated and clinically euthyroid     with normal TSH. 8. Thrombocytopenia possibly related to pelvic mass with elevated     white blood cell count, to be followed up on an outpatient basis.  Please note that the patient did receive Pneumovax during this hospitalization.  DISCHARGE MEDICATIONS: 1. Augmentin 875 mg one p.o. b.i.d. with meals x7 days. 2. Alendronate 70 mg each week. 3. Hydrochlorothiazide 25 mg each day. 4. Levothyroxine 50 mcg each day. 5. Metoprolol tartrate 25 mg every morning. 6. Simvastatin 20 mg every morning.  The patient is to follow up with myself in my office in approximately 2 weeks, at which time, the patient will need reevaluation of CBC to include white blood cell count and  platelet count, reassessment of electrolytes to include a sodium and potassium, reassessment of blood pressure, reassessment of abdominal exam and consideration of reevaluation from Radiology perspective after discussion with Radiology. The patient has been instructed to consume a bland diet with of plenty of yogurt and/or align supplements if need be.  The patient was also instructed to call our office if the patient incurs any bloody stools, increasing abdominal pain, fevers, chills, nausea or vomiting, or uncontrolled diarrhea.  Radiology results; initial CT scan obtained at [facility] on Feb 11, 2011, revealed a 4.5 x 3.7-cm right distal sigmoid abscess extending into the rectal junction with mild ileus with question of diverticular origin.  Results of CT scan of the abdomen and pelvis with IV contrast as well as rectal contrast revealed large irregular soft tissue mass in the midline pelvis involving the rectum as well as adjacent ileal loop representing either rectal carcinoma or recurrent uterine carcinoma, recommended evaluation with colonoscopy. 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  The patient  then underwent colonoscopy by Dr. Leary Roca on Feb 14, 2011, which revealed internal- external small hemorrhoids, proximal distal sigmoid wall edema, spasm probably extrinsic, compression and narrowing with a few diverticula seen, but no mass to biopsy and/or any significant interval and internal infection where mid and proximal sigmoid diverticula otherwise within normal limits to the terminal ileum without other abnormalities seen except for that mentioned above, rare further recommendations for pelvic exam, possible GYN consult, surgical consult to decide whether she needs a laparotomy, laparoscope or just a needle biopsy CT directed.  The patient then underwent bowel prep and CT of the pelvis limited  without contrast was performed after significant bowel prep today on Feb 17, 2011 and this revealed interval resolution of mass-like phlegmonous pelvic process.  Biopsy was therefore deferred and the results were telephone to the surgeon, Dr. Luisa Hart.  There was still mild perirectal and presacral inflammatory/edematous changes that are slightly improved.  LABORATORY EVALUATION:  At the time of discharge, white blood cell count 14.4, hemoglobin 11.6, hematocrit 33.9%, platelet count 547.  Please note that at the time of admission, white blood cell count was 15.1, hemoglobin was 14.1, platelet count was 808.  At the time of discharge, sodium was 129, potassium 4.3, serum CO2 20, glucose 94, BUN 5, creatinine 0.47.  At the time of admission, sodium was 132, potassium 3.1 decreasing to a potassium of 2.8 in the setting of atrial fibrillation, BUN was 14, creatinine 0.5, serum CO2 30.  At the time of admission, alkaline phosphatase 90, total bilirubin 0.4, AST 33, ALT 32, albumin was 3.3 with a calcium of 8.5.  During hospitalization, PT/INR was 1.29 with a PT of 16.3.  TSH was also noted to be normal.  HISTORY OF PRESENT ILLNESS:  Please see the history and physical dictated by myself on Feb 12, 2011; however, this is a 77 year old Caucasian female with past medical history significant for hypertension, iron deficiency anemia, hyperlipidemia, hypothyroidism who presented to our office on Feb 11, 2011 with a 2-week history of abdominal pain with intermittent diarrhea, weight loss with anorexia without fevers or chills and was able to continue to drink by her report.  CT scan was obtained at [facility] with results as above, the results were discussed with both General Surgery as well as Interventional Radiology who agreed to place a drain the following morning with surgical consult pending the next day with Dr. Johna Sheriff.  The patient was initiated on Cipro and Flagyl in the interim in  conjunction with her white blood cell count 16.5.  on the way to Interventional Radiology the morning of appointment, the patient began having diffuse pain in the tissues and jaw along with significant palpitations, was diverted to the emergency room where she was found to be a new-onset atrial fibrillation with rapid ventricular response associated with a potassium of 2.2, sodium 129, and a white blood cell count of 15.1.  The patient started on IV fluids along with calcium channel blocker drip.  She is spontaneously converted to sinus rhythm; however, was seen by [facility] and started on oral beta-blockers in conjunction with potassium supplementation.  Repeat labs, cardiac enzymes, echocardiogram, and chest x-ray ordered.  The patient was subsequently admitted for further evaluation and management.  HOSPITAL COURSE:  The patient was seen and evaluated by Cardiology, General Surgery and later Interventional Radiology who performed the above-mentioned radiology procedures.  From a Cardiology perspective, the patient did bump her cardiac enzymes; however, this was thought to be  due to her high rate and not in acute coronary syndrome.  She did undergo an echocardiogram here in the hospital, which revealed normal LV function with an EF of 55-60%.  Systolic function was normal.  Left atrium was mildly dilated and there was no significant valve abnormalities.  The patient was maintained on IV fluids and beta-blocker and remained hemodynamically stable with good blood pressure control. The patient's blood pressure remained stable from a thyroid perspective, the patient's TSH was normal.  Her electrolytes were corrected and she underwent repeat CT scan with rectal contrast, which revealed the high possibility of malignancy.  General Surgery concurred with CT-guided biopsy after colonoscopy was unremarkable.  The patient did undergo significant bowel prep for the same and repeat CT  scan was obtained today on Feb 17, 2011.  This revealed resolution of pelvic mass.  The patient was feeling well, eating well, had no more further abdominal pain, was afebrile and had strong desires to go home.  Given the patient's clinical improvement, no further diarrhea, no fevers, and hemodynamic stability, the patient was thought to be appropriate for discharge on Feb 17, 2011, to be discharged on Augmentin with close follow up on an outpatient basis.  Please note that the final disposition with respect to further evaluation of this pelvic mass and/or infection will be determined on an outpatient basis.     Larina Earthly, M.D.     RA/MEDQ  D:  02/17/2011  T:  02/18/2011  Job:  [id number]  cc:   Bevelyn Buckles. Bensimhon, MD Everardo All. Madilyn Fireman, M.D. Thomas A. Cornett, M.D.  Electronically Signed by Larina Earthly M.D. on 03/04/2011 06:01:51 PM

## 2011-03-11 NOTE — Op Note (Signed)
NAME:  Dominique [name], Dominique [name]                 ACCOUNT NO.:  [id number]  MEDICAL RECORD NO.:  [id number]           PATIENT TYPE:  I  LOCATION:  2915                         FACILITY:  [facility]  PHYSICIAN:  Petra Kuba, M.D.    DATE OF BIRTH:  Jul 21, 1933  DATE OF PROCEDURE:  02/14/2011 DATE OF DISCHARGE:                              OPERATIVE REPORT   PROCEDURE:  Colonoscopy.  INDICATION:  Patient with abnormal CAT scan worrisome for either rectal or distal sigmoid mass or possibly even fistula.  Consent was signed after risks, benefits, methods, and options thoroughly discussed multiple times in the past and prior to sedation.  MEDICINES USED:  Fentanyl 2.5 mcg, Versed 6 mg.  PROCEDURE:  Rectal inspection is pertinent for external hemorrhoids small.  Digital exam was negative.  The video pediatric colonoscope was inserted and with some difficulty due to a tortuous, narrowed spastic distal sigmoid with abdominal pressure was able to advance through this area and then once through this area fairly easily advanced around the colon to the cecum.  No mass lesion was seen on insertion or any sign of significant infection or bleeding.  A rare left-sided diverticula was seen on insertion.  The cecum was identified by the appendiceal orifice and ileocecal valve.  To advance into the terminal ileum required rolling her on her back and then on her right side.  We advanced about 10-15 cm of the terminal ileum which was normal.  Photo documentation was obtained.  The scope was slowly withdrawn.  The prep was adequate. There was some liquid stool that required washing and suctioning the cecum, ascending, transverse, and descending.  The majority of the sigmoid was normal except for a rare mid and proximal sigmoid diverticula.  In the distal sigmoid proximal rectum, again was some mild erythema somewhat due to scope trauma, edema, spasm and possible extrinsic compression.  On slow withdrawal, no mass  lesion was seen or any obvious lesion in the biopsy.  Once back into the rectum, anorectal pull-through and retroflexion confirmed some small hemorrhoids although retroflexion was difficult due to her inability to hold air.  The scope was reinserted back through the proximal rectum and distal sigmoid again confirming above findings.  No mass lesion or area that seemed to be worth biopsying was seen.  Air was suctioned, scope removed.  The patient tolerated the procedure adequately.  There was no obvious immediate complication and no air or liquid was seen coming from her vagina or felt by the nurse coming from her vagina during this procedure.  ASSESSMENT: 1. Internal-external small hemorrhoids. 2. Proximal rectal distal sigmoid wall edema spasm probable extrinsic     compression and narrowing with a few diverticula seen but no mass     to biopsy or any significant internal infection. 3. Rare mid and proximal sigmoid diverticula. 4. Otherwise, within normal limits to the terminal ileum without other     abnormalities seen except for above.  PLAN:  She probably needs a pelvic exam, possible GYN consult, surgical consult to decide whether she needs laparotomy, laparoscope or just a needle biopsy CT directed.  Might also consider pelvic ultrasound or MRI to help delineate.  Please let us know if we can help any further.  We will follow at a distance on the computer and check on her to make sure no delayed complications.          ______________________________ Petra Kuba, M.D.     MEM/MEDQ  D:  02/14/2011  T:  02/14/2011  Job:  [id number]  cc:   Larina Earthly, M.D. Wilmon Arms. Tsuei, M.D.  Electronically Signed by Vida Rigger M.D. on 03/11/2011 01:28:54 PM

## 2011-04-27 NOTE — Consult Note (Signed)
NAME:  Dominique [name], Dominique [name]                 ACCOUNT NO.:  [id number]  MEDICAL RECORD NO.:  [id number]           PATIENT TYPE:  I  LOCATION:  2915                         FACILITY:  [facility]  PHYSICIAN:  Willis Modena, MD     DATE OF BIRTH:  Sep 08, 1933  DATE OF CONSULTATION:  02/13/2011 DATE OF DISCHARGE:                                CONSULTATION   REASON FOR CONSULTATION:  Abdominal pain, abnormal CT scan.  CHIEF COMPLAINT:  Abdominal pain.  HISTORY OF PRESENT ILLNESS:  Ms. Mette is a 77 year old female who, a couple of weeks ago, began having a change in her bowel habits (normal bowel habits to alternating diarrhea and constipation), associated with anorexia and about a 10-pound weight loss.  She had a CT scan done as an outpatient at [facility] that showed a sizable sigmoid abscess.  She was in process of having a drain placed by Interventional Radiology.  In the midst of this, she developed spontaneous atrial fibrillation with a troponin leak.  Her drain was subsequently cancelled and she was admitted to the hospital.  The patient's atrial fibrillation spontaneously converted to sinus rhythm.  She had a slight elevation in her troponin and has been seen by Cardiology who feels that it is safe to proceed with any other type of diagnostic evaluation.  A repeat CT scan was done with more contrast (IV contrast in addition to rectal contrast).  On the second scan, it was felt that this area in her rectosigmoid was a mass and not an abscess. Ms. Knupp had colonoscopy in 2005, that was seemingly normal including no evidence of diverticula.  She has no blood in her stool.  There is no known family history of colon cancer or colon polyps.  Past medical history, past surgical history, medications, allergies, family history, social history, review of systems, all from dictated note from Dr. Felipa Eth, dictated on Feb 12, 2011.  I have reviewed and I agreed.  PHYSICAL EXAMINATION:  VITAL SIGNS:   Blood pressure is 131/58, heart rate 59, respiratory rate 13. GENERAL:  Ms. Barba is in no acute distress. HEENT:  Normocephalic, atraumatic.  No oropharyngeal lesions.  Eyes, conjunctivae slightly pale.  Sclerae anicteric. LUNGS:  Clear. HEART:  Regular. ABDOMEN:  Mild distention.  Mild tympany.  Bowel sounds normoactive. She is not appreciably tender. EXTREMITIES:  No peripheral cyanosis, clubbing, or edema. NEUROLOGIC:  Diffusely weak but nonfocal without lateralizing signs. LYMPHATICS:  No palpable axillary, submandibular, or supraclavicular adenopathy.  LABORATORY STUDIES:  Hemoglobin on admission was 14, currently 10.2, white count 12.3, platelet count 631.  Sodium 132, potassium 2.8, chloride 95, bicarb 26, BUN 7, creatinine 0.5.  Total protein 5.5, albumin 2.3, total bilirubin 0.2, alk phos 57, AST 23, ALT 19.  Cardiac enzymes currently normalized.  RADIOLOGY:  As mentioned, CT scan of the abdomen and pelvis with oral and rectal contrast that shows a soft tissue mass involving the rectum.  IMPRESSION:  Ms. Haughey is a 77 year old female, presenting with change in bowel habits, abdominal pain, weight loss.  CT initially as an outpatient was thought to represent an abscess, but  with administration of rectal contrast in addition to IV contrast, it is felt by our radiologist here that she has a mass.  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  Primary colon cancer is favored.  PLAN: 1. We will proceed with colonoscopy tomorrow by my partner, Dr. Ewing Schlein. 2. I have discussed the case with both Dr. Felipa Eth and Dr. Gala Romney in     Cardiology.  It is felt that it is safe for her to tolerate     colonoscopy and that her elevated troponin was more function of     elevated heart rate.  She has currently been in sinus rhythm for     sometime now.  Thanks again for allowing me to participate in Ms. Ashford's care.     Willis Modena,  MD     WO/MEDQ  D:  02/13/2011  T:  02/13/2011  Job:  [id number]  Electronically Signed by Willis Modena  on 04/27/2011 01:14:30 PM

## 2011-07-24 ENCOUNTER — Other Ambulatory Visit: Payer: Self-pay | Admitting: Internal Medicine

## 2011-07-29 ENCOUNTER — Ambulatory Visit
Admission: RE | Admit: 2011-07-29 | Discharge: 2011-07-29 | Disposition: A | Discharge status: Home / Self Care | Payer: Medicare Other | Source: Ambulatory Visit | Attending: Internal Medicine | Admitting: Internal Medicine

## 2011-07-29 MED ORDER — IOHEXOL 300 MG/ML  SOLN
100.0000 mL | Freq: Once | INTRAMUSCULAR | Status: AC | PRN
  Administered 2011-07-29: 100 mL via INTRAVENOUS

## 2014-11-19 ENCOUNTER — Other Ambulatory Visit: Payer: Self-pay | Admitting: Internal Medicine

## 2014-11-19 DIAGNOSIS — N644 Mastodynia: Secondary | ICD-10-CM

## 2014-11-29 ENCOUNTER — Ambulatory Visit: Payer: Self-pay

## 2014-11-29 ENCOUNTER — Ambulatory Visit
Admission: RE | Admit: 2014-11-29 | Discharge: 2014-11-29 | Disposition: A | Discharge status: Home / Self Care | Payer: Medicare Other | Source: Ambulatory Visit | Attending: Internal Medicine | Admitting: Internal Medicine

## 2014-11-29 DIAGNOSIS — N644 Mastodynia: Secondary | ICD-10-CM

## 2015-05-20 ENCOUNTER — Encounter (HOSPITAL_COMMUNITY): Payer: Self-pay | Admitting: Emergency Medicine

## 2015-05-20 ENCOUNTER — Emergency Department (HOSPITAL_COMMUNITY)
Admission: EM | Admit: 2015-05-20 | Discharge: 2015-05-20 | Disposition: A | Discharge status: Home / Self Care | Payer: Medicare Other | Attending: Emergency Medicine | Admitting: Emergency Medicine

## 2015-05-20 DIAGNOSIS — Z859 Personal history of malignant neoplasm, unspecified: Secondary | ICD-10-CM | POA: Insufficient documentation

## 2015-05-20 DIAGNOSIS — M79602 Pain in left arm: Secondary | ICD-10-CM | POA: Insufficient documentation

## 2015-05-20 DIAGNOSIS — M546 Pain in thoracic spine: Secondary | ICD-10-CM | POA: Diagnosis not present

## 2015-05-20 DIAGNOSIS — M25512 Pain in left shoulder: Secondary | ICD-10-CM | POA: Insufficient documentation

## 2015-05-20 DIAGNOSIS — I4891 Unspecified atrial fibrillation: Secondary | ICD-10-CM | POA: Diagnosis not present

## 2015-05-20 DIAGNOSIS — I10 Essential (primary) hypertension: Secondary | ICD-10-CM | POA: Insufficient documentation

## 2015-05-20 DIAGNOSIS — Z79899 Other long term (current) drug therapy: Secondary | ICD-10-CM | POA: Insufficient documentation

## 2015-05-20 MED ORDER — KETOROLAC TROMETHAMINE 60 MG/2ML IM SOLN
30.0000 mg | Freq: Once | INTRAMUSCULAR | Status: AC
  Administered 2015-05-20: 30 mg via INTRAMUSCULAR
  Filled 2015-05-20: qty 2

## 2015-05-20 MED ORDER — HYDROCODONE-ACETAMINOPHEN 5-325 MG PO TABS: 1.0000 | ORAL_TABLET | ORAL | Status: DC | PRN

## 2015-05-20 NOTE — ED Notes (Signed)
Pt. Stated, I was moving and I guess i was moving too many boxes and Im having pain in my left arm, shoulder, and back.

## 2015-05-20 NOTE — ED Notes (Signed)
Pt states some relief of L shoulder/arm pain with medication. 6/10 pain at present.

## 2015-05-20 NOTE — ED Provider Notes (Signed)
CSN: [id number]     Arrival date & time 05/20/15  0700 History   First MD Initiated Contact with Patient 05/20/15 [phone]     Chief Complaint  Patient presents with  . Arm Pain  . Shoulder Pain  . Back Pain     (Consider location/radiation/quality/duration/timing/severity/associated sxs/prior Treatment) Patient is a 81 y.o. female presenting with arm pain, shoulder pain, and back pain. The history is provided by the patient and medical records.  Arm Pain Associated symptoms include arthralgias and myalgias.  Shoulder Pain Associated symptoms: back pain   Back Pain  This is a 81 y.o. F with hx of HTN, cancer, AFIB, presenting to the ED for left arm, shoulder, and upper back pain.  Patient states she is in the process of moving and has been moving boxes for the past 2 days.  She states the boxes were not overly heavy and she was not carrying them, but rather lifting them and placing them on a hand truck.  She denies numbness or weakness of left arm.  No chest pain or SOB.  Patient states she thinks she pulled a muscle.  She has taken aleve PTA with some relief.  VSS.  Past Medical History  Diagnosis Date  . Hypertension   . Cancer   . A-fib    History reviewed. No pertinent past surgical history. No family history on file. History  Substance Use Topics  . Smoking status: Never Smoker   . Smokeless tobacco: Not on file  . Alcohol Use: No   OB History    No data available     Review of Systems  Musculoskeletal: Positive for myalgias, back pain and arthralgias.  All other systems reviewed and are negative.     Allergies  Codeine; Darvocet; Demerol; Percocet; and Prednisone  Home Medications   Prior to Admission medications   Medication Sig Start Date End Date Taking? Authorizing Provider  alendronate (FOSAMAX) 70 MG tablet Take 70 mg by mouth once a week. Take with a full glass of water on an empty stomach.    Historical Provider, MD  hydrochlorothiazide (HYDRODIURIL) 25  MG tablet Take 25 mg by mouth daily.    Historical Provider, MD  levothyroxine (SYNTHROID, LEVOTHROID) 50 MCG tablet Take 50 mcg by mouth daily before breakfast.    Historical Provider, MD  metoprolol succinate (TOPROL-XL) 25 MG 24 hr tablet Take 25 mg by mouth daily.    Historical Provider, MD  simvastatin (ZOCOR) 20 MG tablet Take 20 mg by mouth daily.    Historical Provider, MD   BP 190/87 mmHg  Pulse 72  Temp(Src) 98.1 F (36.7 C) (Oral)  Resp 17  Ht 4\' 11"  (1.499 m)  Wt 132 lb (59.875 kg)  BMI 26.65 kg/m2  SpO2 97%   Physical Exam  Constitutional: She is oriented to person, place, and time. She appears well-developed and well-nourished. No distress.  HENT:  Head: Normocephalic and atraumatic.  Mouth/Throat: Oropharynx is clear and moist.  Eyes: Conjunctivae and EOM are normal. Pupils are equal, round, and reactive to light.  Neck: Normal range of motion. Neck supple.  Cardiovascular: Normal rate, regular rhythm and normal heart sounds.   Pulmonary/Chest: Effort normal and breath sounds normal. No respiratory distress. She has no wheezes.  Abdominal: Soft. Bowel sounds are normal. There is no tenderness. There is no guarding.  Musculoskeletal: Normal range of motion.  Reproducible tenderness of left upper arm, shoulder, and left shoulder blade; no bony deformities; no swelling or varicosities noted;  compartments soft, easily compressible; full ROM of left elbow and shoulder, some pain noted with this; strong radial pulse and cap refill; sensation intact  Neurological: She is alert and oriented to person, place, and time.  Skin: Skin is warm and dry. She is not diaphoretic.  Psychiatric: She has a normal mood and affect.  Nursing note and vitals reviewed.   ED Course  Procedures (including critical care time) Labs Review Labs Reviewed - No data to display  Imaging Review No results found.   EKG Interpretation   Date/Time:  Monday May 20 2015 07:28:58 EDT Ventricular  Rate:  79 PR Interval:  174 QRS Duration: 80 QT Interval:  378 QTC Calculation: 433 R Axis:   33 Text Interpretation:  Normal sinus rhythm Normal ECG Confirmed by Hazle Coca [id number]) on 05/20/2015 8:12:43 AM      MDM   Final diagnoses:  Left arm pain   81 year old female here with left arm shoulder pain after moving boxes for the past 2 days. She denies any chest pain or shortness of breath. No known cardiac history. Pain is reproducible with palpation of left upper arm and shoulder without noted deformity, swelling, or clinical signs of DVT. Her arm is neurovascularly intact. I do not suspect acute fracture or dislocation. Her pain is most likely musculoskeletal from repetitive muscle use. EKG sinus rhythm, doubt ACS, PE, dissection, or other acute cardiac event at this time.  Patient was treated here with IM Toradol with some improvement of her symptoms. She'll be discharged home with short supply pain medication. She was encouraged to follow-up with her PCP.  Discussed plan with patient, he/she acknowledged understanding and agreed with plan of care.  Return precautions given for new or worsening symptoms.  Case discussed with attending physician, Dr. Ralene Bathe, who evaluated patient and agrees with assessment and plan of care.  Larene Pickett, PA-C 05/20/15 [place], MD 05/20/15 [phone]

## 2015-05-20 NOTE — Discharge Instructions (Signed)
Take the prescribed medication as directed. Follow-up with your primary care physician. Return to the ED for new or worsening symptoms-- if you develop numbness or weakness of left arm, chest pain, shortness of breath, high fever, etc.

## 2015-12-03 ENCOUNTER — Other Ambulatory Visit: Payer: Self-pay

## 2015-12-03 DIAGNOSIS — Z1231 Encounter for screening mammogram for malignant neoplasm of breast: Secondary | ICD-10-CM

## 2015-12-24 ENCOUNTER — Ambulatory Visit: Payer: Medicare Other

## 2016-01-21 ENCOUNTER — Ambulatory Visit
Admission: RE | Admit: 2016-01-21 | Discharge: 2016-01-21 | Disposition: A | Discharge status: Home / Self Care | Payer: Medicare Other | Source: Ambulatory Visit

## 2016-01-21 DIAGNOSIS — Z1231 Encounter for screening mammogram for malignant neoplasm of breast: Secondary | ICD-10-CM

## 2016-09-02 ENCOUNTER — Ambulatory Visit (INDEPENDENT_AMBULATORY_CARE_PROVIDER_SITE_OTHER): Payer: Medicare Other | Admitting: Physician Assistant

## 2016-09-02 VITALS — BP 122/72 | HR 72 | Temp 98.3°F | Resp 17 | Ht 58.5 in | Wt 135.0 lb

## 2016-09-02 DIAGNOSIS — R220 Localized swelling, mass and lump, head: Secondary | ICD-10-CM | POA: Diagnosis not present

## 2016-09-02 DIAGNOSIS — Z862 Personal history of diseases of the blood and blood-forming organs and certain disorders involving the immune mechanism: Secondary | ICD-10-CM | POA: Insufficient documentation

## 2016-09-02 DIAGNOSIS — Z8619 Personal history of other infectious and parasitic diseases: Secondary | ICD-10-CM | POA: Insufficient documentation

## 2016-09-02 DIAGNOSIS — E039 Hypothyroidism, unspecified: Secondary | ICD-10-CM | POA: Insufficient documentation

## 2016-09-02 DIAGNOSIS — I10 Essential (primary) hypertension: Secondary | ICD-10-CM | POA: Insufficient documentation

## 2016-09-02 DIAGNOSIS — Z8542 Personal history of malignant neoplasm of other parts of uterus: Secondary | ICD-10-CM | POA: Insufficient documentation

## 2016-09-02 NOTE — Patient Instructions (Addendum)
1. Complete the prednisone prescribed by Dr. Dagmar Hait. 2. Stop the Neosporin. Stick with Vaseline. 3. Drink lots of water. 4. Notify the dentist's office of what's going on, as it may have been an exposure there that triggered the reaction, perhaps worsened by the anti-fungal cream from Dr. Danna Hefty office.    IF you received an x-ray today, you will receive an invoice from [facility]. Please contact [facility] at [phone] with questions or concerns regarding your invoice.   IF you received labwork today, you will receive an invoice from [employer]. Please contact [facility] at [phone] with questions or concerns regarding your invoice.   Our billing staff will not be able to assist you with questions regarding bills from these companies.  You will be contacted with the lab results as soon as they are available. The fastest way to get your results is to activate your My Chart account. Instructions are located on the last page of this paperwork. If you have not heard from [place] regarding the results in 2 weeks, please contact this office.

## 2016-09-02 NOTE — Progress Notes (Signed)
Patient ID: Dominique [name], female     DOB: [birth year], 81 y.o.    MRN: [id number]  PCP: Tivis Ringer, MD  Chief Complaint  Patient presents with  . redness around mouth of unknown origin    Subjective:   This patient is new to this practice and presents for evaluation of redness and swelling of the lips. She is accompanied by her son, who helps to provide historical details.  When she turned the heat on in her home a couple of weeks ago, she noted intermittent mild lip swelling. The vent blows directly on her bed. Her son partly covered it and that seemed to help.  Saw the dentist Monday last week (11/13), for a cleaning. She lost a cap, and so was also evaluated for dental extraction. She was advised to use ACT for dry mouth. The morning of 11/15 she developed significant lip swelling. She had previously used a rinse to build enamel that caused similar symptoms, but hasn't resumed that product.   She saw her PCP that day, who prescribed an antifungal cream. That seemed to cause a worsening of the symptoms and they called Dr. Danna Hefty office on 08/31/2016. He couldn't work her in, so called in Prednisone taper. She has several days of the taper remaining.  Since starting the prednisone taper, her symptoms seem to be improving. This morning he noted blood on her pillow, and notes her lips have been bleeding.  The lips are very dry and cracked. She isn't able to open her mouth wide. Two mornings this week she awoke with a white, mushy film on the lips. She scraped it off easily. That was not present this morning.  Unable to brush her teeth, so she's using Chlorhexidine Gluconate rinse. She is applying Vaseline and Neosporin to the area.  No fever, chills. No nausea/vomiting. No other new foods, medications, etc.  Review of Systems As above.    Prior to Admission medications   Medication Sig Start Date End Date Taking? Authorizing Provider  aspirin EC 81 MG tablet Take 81 mg  by mouth daily.   Yes Historical Provider, MD  hydrochlorothiazide (HYDRODIURIL) 25 MG tablet Take 25 mg by mouth daily.   Yes Historical Provider, MD  HYDROcodone-acetaminophen (NORCO/VICODIN) 5-325 MG per tablet Take 1 tablet by mouth every 4 (four) hours as needed. 05/20/15  Yes Larene Pickett, PA-C  ibuprofen (ADVIL,MOTRIN) 200 MG tablet Take 200 mg by mouth every 6 (six) hours as needed for mild pain.   Yes Historical Provider, MD  levothyroxine (SYNTHROID, LEVOTHROID) 50 MCG tablet Take 50 mcg by mouth daily before breakfast.   Yes Historical Provider, MD  metoprolol succinate (TOPROL-XL) 50 MG 24 hr tablet Take 50 mg by mouth daily. Take with or immediately following a meal.   Yes Historical Provider, MD  simvastatin (ZOCOR) 20 MG tablet Take 20 mg by mouth daily.   Yes Historical Provider, MD  predniSONE (DELTASONE) 20 MG tablet  08/31/16   Historical Provider, MD     Allergies  Allergen Reactions  . Codeine   . Darvocet [Propoxyphene N-Acetaminophen]   . Demerol   . Percocet [Oxycodone-Acetaminophen]   . Prednisone      Patient Active Problem List   Diagnosis Date Noted  . History of uterine cancer 09/02/2016  . Benign essential HTN 09/02/2016  . Hypothyroidism 09/02/2016  . History of shingles 09/02/2016  . History of iron deficiency anemia 09/02/2016  . History of thrombocytopenia 09/02/2016  . History of atrial  fibrillation 09/02/2016     No family history on file.   Social History   Social History  . Marital status: Widowed    Spouse name: 08/11/2014  . Number of children: N/A  . Years of education: N/A   Occupational History  . retired Theme park manager    Social History Main Topics  . Smoking status: Never Smoker  . Smokeless tobacco: Never Used  . Alcohol use No  . Drug use: No  . Sexual activity: No   Other Topics Concern  . Not on file   Social History Narrative   Lives alone         Objective:  Physical Exam  Constitutional: She is oriented  to person, place, and time. She appears well-developed and well-nourished. She is active and cooperative. No distress.  BP 122/72 (BP Location: Right Arm, Patient Position: Sitting, Cuff Size: Normal)   Pulse 72   Temp 98.3 F (36.8 C) (Oral)   Resp 17   Ht 4' 10.5" (1.486 m)   Wt 135 lb (61.2 kg)   SpO2 97%   BMI 27.73 kg/m   HENT:  Head: Normocephalic and atraumatic.  Right Ear: Hearing normal.  Left Ear: Hearing normal.  Mouth/Throat: Oropharynx is clear and moist and mucous membranes are normal.    The perioral skin is erythematous. It is not presently edematous. The lips are not swollen, but are very dry and cracked with evidence of recent bleeding. Within the mouth, the buccal surfaces are normal.  Eyes: Conjunctivae are normal. No scleral icterus.  Neck: Normal range of motion. Neck supple. No thyromegaly present.  Cardiovascular: Normal rate, regular rhythm and normal heart sounds.   Pulses:      Radial pulses are 2+ on the right side, and 2+ on the left side.  Pulmonary/Chest: Effort normal and breath sounds normal.  Lymphadenopathy:       Head (right side): No tonsillar, no preauricular, no posterior auricular and no occipital adenopathy present.       Head (left side): No tonsillar, no preauricular, no posterior auricular and no occipital adenopathy present.    She has no cervical adenopathy.       Right: No supraclavicular adenopathy present.       Left: No supraclavicular adenopathy present.  Neurological: She is alert and oriented to person, place, and time. No sensory deficit.  Skin: Skin is warm, dry and intact. No rash noted. No cyanosis or erythema. Nails show no clubbing.  Psychiatric: She has a normal mood and affect. Her speech is normal and behavior is normal.             Assessment & Plan:  1. Swelling of both lips Unclear etiology (possibly the gloves at the dental office? Possibly exacerbated by topical antifungal?). Appears to be improving.  Advised to stop the Neosporin, as some people develop skin sensitivity to a component in that product. Stick with Vaseline. Also use cool compresses. Increase oral hydration. RTC here or F/U with Dr. Dagmar Hait if symptoms worsen or persist.   Fara Chute, PA-C Physician Assistant-Certified [facility]

## 2017-09-29 ENCOUNTER — Emergency Department (HOSPITAL_COMMUNITY): Payer: Medicare Other

## 2017-09-29 ENCOUNTER — Encounter (HOSPITAL_COMMUNITY): Payer: Self-pay

## 2017-09-29 ENCOUNTER — Observation Stay (HOSPITAL_COMMUNITY)
Admission: EM | Admit: 2017-09-29 | Discharge: 2017-09-30 | Disposition: A | Discharge status: Home / Self Care | Payer: Medicare Other | Attending: Cardiology | Admitting: Cardiology

## 2017-09-29 ENCOUNTER — Other Ambulatory Visit (HOSPITAL_COMMUNITY): Payer: Medicare Other

## 2017-09-29 ENCOUNTER — Other Ambulatory Visit: Payer: Self-pay

## 2017-09-29 DIAGNOSIS — E785 Hyperlipidemia, unspecified: Secondary | ICD-10-CM | POA: Diagnosis not present

## 2017-09-29 DIAGNOSIS — Z79899 Other long term (current) drug therapy: Secondary | ICD-10-CM | POA: Diagnosis not present

## 2017-09-29 DIAGNOSIS — Z90710 Acquired absence of both cervix and uterus: Secondary | ICD-10-CM | POA: Diagnosis not present

## 2017-09-29 DIAGNOSIS — R079 Chest pain, unspecified: Secondary | ICD-10-CM | POA: Insufficient documentation

## 2017-09-29 DIAGNOSIS — Z7982 Long term (current) use of aspirin: Secondary | ICD-10-CM | POA: Insufficient documentation

## 2017-09-29 DIAGNOSIS — J90 Pleural effusion, not elsewhere classified: Secondary | ICD-10-CM | POA: Diagnosis not present

## 2017-09-29 DIAGNOSIS — Z885 Allergy status to narcotic agent status: Secondary | ICD-10-CM | POA: Insufficient documentation

## 2017-09-29 DIAGNOSIS — I480 Paroxysmal atrial fibrillation: Secondary | ICD-10-CM | POA: Diagnosis not present

## 2017-09-29 DIAGNOSIS — R002 Palpitations: Secondary | ICD-10-CM | POA: Insufficient documentation

## 2017-09-29 DIAGNOSIS — I10 Essential (primary) hypertension: Secondary | ICD-10-CM | POA: Diagnosis not present

## 2017-09-29 DIAGNOSIS — E039 Hypothyroidism, unspecified: Secondary | ICD-10-CM | POA: Diagnosis not present

## 2017-09-29 DIAGNOSIS — D72829 Elevated white blood cell count, unspecified: Secondary | ICD-10-CM | POA: Diagnosis not present

## 2017-09-29 DIAGNOSIS — E871 Hypo-osmolality and hyponatremia: Secondary | ICD-10-CM | POA: Diagnosis not present

## 2017-09-29 DIAGNOSIS — Z96653 Presence of artificial knee joint, bilateral: Secondary | ICD-10-CM | POA: Insufficient documentation

## 2017-09-29 DIAGNOSIS — J9811 Atelectasis: Secondary | ICD-10-CM | POA: Diagnosis not present

## 2017-09-29 DIAGNOSIS — D696 Thrombocytopenia, unspecified: Secondary | ICD-10-CM | POA: Diagnosis not present

## 2017-09-29 DIAGNOSIS — R011 Cardiac murmur, unspecified: Secondary | ICD-10-CM | POA: Diagnosis not present

## 2017-09-29 DIAGNOSIS — Z8619 Personal history of other infectious and parasitic diseases: Secondary | ICD-10-CM | POA: Insufficient documentation

## 2017-09-29 DIAGNOSIS — I071 Rheumatic tricuspid insufficiency: Secondary | ICD-10-CM | POA: Diagnosis not present

## 2017-09-29 DIAGNOSIS — Z8542 Personal history of malignant neoplasm of other parts of uterus: Secondary | ICD-10-CM | POA: Insufficient documentation

## 2017-09-29 DIAGNOSIS — R42 Dizziness and giddiness: Secondary | ICD-10-CM | POA: Insufficient documentation

## 2017-09-29 DIAGNOSIS — Z7901 Long term (current) use of anticoagulants: Secondary | ICD-10-CM | POA: Diagnosis not present

## 2017-09-29 DIAGNOSIS — Z888 Allergy status to other drugs, medicaments and biological substances status: Secondary | ICD-10-CM | POA: Insufficient documentation

## 2017-09-29 DIAGNOSIS — I4891 Unspecified atrial fibrillation: Secondary | ICD-10-CM | POA: Diagnosis present

## 2017-09-29 LAB — CBC
HCT: 37.5 % (ref 36.0–46.0)
Hemoglobin: 13 g/dL (ref 12.0–15.0)
MCH: 34.3 pg — ABNORMAL HIGH (ref 26.0–34.0)
MCHC: 34.7 g/dL (ref 30.0–36.0)
MCV: 98.9 fL (ref 78.0–100.0)
PLATELETS: 405 10*3/uL — AB (ref 150–400)
RBC: 3.79 MIL/uL — ABNORMAL LOW (ref 3.87–5.11)
RDW: 14.8 % (ref 11.5–15.5)
WBC: 17.4 10*3/uL — AB (ref 4.0–10.5)

## 2017-09-29 LAB — BASIC METABOLIC PANEL
Anion gap: 10 (ref 5–15)
BUN: 10 mg/dL (ref 6–20)
CALCIUM: 9.5 mg/dL (ref 8.9–10.3)
CO2: 23 mmol/L (ref 22–32)
CREATININE: 0.71 mg/dL (ref 0.44–1.00)
Chloride: 97 mmol/L — ABNORMAL LOW (ref 101–111)
GFR calc non Af Amer: >60 mL/min (ref >60)
Glucose, Bld: 202 mg/dL — ABNORMAL HIGH (ref 65–99)
Potassium: 3.8 mmol/L (ref 3.5–5.1)
Sodium: 130 mmol/L — ABNORMAL LOW (ref 135–145)

## 2017-09-29 LAB — URINALYSIS, ROUTINE W REFLEX MICROSCOPIC
Bilirubin Urine: NEGATIVE
Glucose, UA: NEGATIVE mg/dL
Hgb urine dipstick: NEGATIVE
KETONES UR: NEGATIVE mg/dL
Nitrite: NEGATIVE
PH: 7 (ref 5.0–8.0)
Protein, ur: NEGATIVE mg/dL
SQUAMOUS EPITHELIAL / LPF: NONE SEEN
Specific Gravity, Urine: 1.008 (ref 1.005–1.030)

## 2017-09-29 LAB — I-STAT TROPONIN, ED: TROPONIN I, POC: 0.03 ng/mL (ref 0.00–0.08)

## 2017-09-29 LAB — TSH: TSH: 2.64 u[IU]/mL (ref 0.350–4.500)

## 2017-09-29 LAB — MAGNESIUM: MAGNESIUM: 1.8 mg/dL (ref 1.7–2.4)

## 2017-09-29 MED ORDER — MIDAZOLAM HCL 2 MG/2ML IJ SOLN
2.0000 mg | Freq: Once | INTRAMUSCULAR | Status: AC
  Administered 2017-09-29: 2 mg via INTRAVENOUS
  Filled 2017-09-29: qty 2

## 2017-09-29 MED ORDER — APIXABAN 5 MG PO TABS
5.0000 mg | ORAL_TABLET | Freq: Two times a day (BID) | ORAL | Status: DC
  Administered 2017-09-29 – 2017-09-30 (×2): 5 mg via ORAL
  Filled 2017-09-29 (×2): qty 1

## 2017-09-29 MED ORDER — APIXABAN 5 MG PO TABS
5.0000 mg | ORAL_TABLET | Freq: Two times a day (BID) | ORAL | Status: DC
  Filled 2017-09-29: qty 1

## 2017-09-29 MED ORDER — AMIODARONE HCL IN DEXTROSE 360-4.14 MG/200ML-% IV SOLN
60.0000 mg/h | INTRAVENOUS | Status: AC
  Administered 2017-09-29: 60 mg/h via INTRAVENOUS
  Filled 2017-09-29 (×2): qty 200

## 2017-09-29 MED ORDER — AMIODARONE HCL IN DEXTROSE 360-4.14 MG/200ML-% IV SOLN
30.0000 mg/h | INTRAVENOUS | Status: DC
  Administered 2017-09-29 (×2): 30 mg/h via INTRAVENOUS
  Filled 2017-09-29 (×2): qty 200

## 2017-09-29 MED ORDER — DILTIAZEM HCL-DEXTROSE 100-5 MG/100ML-% IV SOLN (PREMIX)
5.0000 mg/h | INTRAVENOUS | Status: DC
  Administered 2017-09-29: 5 mg/h via INTRAVENOUS
  Filled 2017-09-29: qty 100

## 2017-09-29 MED ORDER — DILTIAZEM LOAD VIA INFUSION
10.0000 mg | Freq: Once | INTRAVENOUS | Status: AC
  Administered 2017-09-29: 10 mg via INTRAVENOUS
  Filled 2017-09-29: qty 10

## 2017-09-29 MED ORDER — AMIODARONE LOAD VIA INFUSION
150.0000 mg | Freq: Once | INTRAVENOUS | Status: AC
  Administered 2017-09-29: 150 mg via INTRAVENOUS
  Filled 2017-09-29: qty 83.34

## 2017-09-29 MED ORDER — SODIUM CHLORIDE 0.9 % IV SOLN
INTRAVENOUS | Status: DC
  Administered 2017-09-29 – 2017-09-30 (×5): via INTRAVENOUS

## 2017-09-29 NOTE — ED Notes (Addendum)
Attempted cardioversion at 120 joules. No change in rhythm.

## 2017-09-29 NOTE — ED Notes (Signed)
X-ray at bedside

## 2017-09-29 NOTE — ED Notes (Addendum)
Attempted cardioversion at 150 joules. No changes in rhythm

## 2017-09-29 NOTE — Consult Note (Signed)
Cardiology Consultation:   Patient ID: MICHA ERCK; [id number]; [birth year]   Admit date: 09/29/2017 Date of Consult: 09/29/2017  Primary Care Provider: [name], MD Primary Cardiologist: New- Dr Radford Pax   Patient Profile:   Dominique [name] is a 83 y.o. female with a hx of PAF in setting acute illness in 2012, HTN, dyslipidemia, hypothyroidism, distant uterine cancer, thrombocytopenia, osteoarthritis s/p RTKA 2011, hx of shingles, hx of hyponatremia and hypokaleima who is being seen today for the evaluation of new onset atrial fibrillation at the request of Dr. Vanita Panda.  History of Present Illness:   [name] presented to the [facility] ED this morning with complaints of lightheadedness and palpitations that began about 1 hour prior to presentation. She denies prior cardiac history or afib, however, upon chart review she was seen by Dr. Haroldine Laws in 02/2011 for episode of atrial fibrillation in the setting of colonic abscesses with constipation and diarrhea and hypokalemia with K+ 2.2. She was treated with cardizem and converted to SR. She was discharged on BB. She was not on a blood thinner.   Upon my exam she notes that she was doing fine this morning. After she ate breakfast and took her morning meds, at about 9:00, she suddenly felt shaky and her heart was pounding. She had mild chest pressure, weakness and lightheadedness. She denies shortness of breath. She went to lay down but the symptoms did not improve. This is the first occurrence of these symptoms. Prior to this she was living alone and caring for herself. She was doing housework and occasionally moving furniture with no exertional chest discomfort or dyspnea. She denies orthopnea, PND or edema. She is feeling better now with her heart rate now in the 130's down from the 160's.   2 attempts at electrical cardioversion were unsuccessful. Cardizem drip has been initiated as well as Eliquis.   She is noted to have leukocytosis  with WBCs 17.4. Potassium is normal. Sodium and chloride are mildly decreased. Kidney function is normal with SCr 0.71. TSH is normal at 2.640. First troponin is negative at 0.03. Hgb is normal at 13.0. CXR shows small area of right lower lobe atelectasis/ infiltrate and small right effusion. Negative for heart failure.  Past Medical History:  Diagnosis Date  . A-fib ([place])   . Anemia   . Cancer ([place])    uterine  . Hypertension   . Shingles   . Thrombocytopenia ([place])   . Thyroid disease     Past Surgical History:  Procedure Laterality Date  . ABDOMINAL HYSTERECTOMY    . JOINT REPLACEMENT Bilateral    knees     Home Medications:  Prior to Admission medications   Medication Sig Start Date End Date Taking? Authorizing Provider  aspirin EC 81 MG tablet Take 81 mg by mouth daily.   Yes [provider]  fexofenadine (ALLEGRA) 180 MG tablet Take 180 mg by mouth daily.   Yes [provider]  levothyroxine (SYNTHROID, LEVOTHROID) 50 MCG tablet Take 50 mcg by mouth daily before breakfast.   Yes [provider]  metoprolol tartrate (LOPRESSOR) 50 MG tablet Take 50 mg by mouth daily. 09/28/17  Yes [provider]  simvastatin (ZOCOR) 20 MG tablet Take 20 mg by mouth daily.   Yes [provider]  hydrochlorothiazide (HYDRODIURIL) 25 MG tablet Take 25 mg by mouth daily.    [provider]  HYDROcodone-acetaminophen (NORCO/VICODIN) 5-325 MG per tablet Take 1 tablet by mouth every 4 (four) hours as  needed. Patient not taking: Reported on 09/29/2017 05/20/15   Dominique Pickett, PA-C  ibuprofen (ADVIL,MOTRIN) 200 MG tablet Take 200 mg by mouth every 6 (six) hours as needed for mild pain.    [provider]    Inpatient Medications: Scheduled Meds: . amiodarone  150 mg Intravenous Once   Continuous Infusions: . sodium chloride 125 mL/hr at 09/29/17 1037  . amiodarone     Followed by  . amiodarone    . diltiazem (CARDIZEM) infusion  12.5 mg/hr (09/29/17 1201)   PRN Meds:   Allergies:    Allergies  Allergen Reactions  . Codeine   . Darvocet [Propoxyphene N-Acetaminophen]   . Demerol   . Percocet [Oxycodone-Acetaminophen]   . Prednisone     Uncertain this is true. Was documented when patient was admitted 02/12/2012, but not recalled by either the patient nor her son. Tolerating it easily 08/2016.     Social History:   Social History   Socioeconomic History  . Marital status: Widowed    Spouse name: 08/11/2014  . Number of children: Not on file  . Years of education: Not on file  . Highest education level: Not on file  Social Needs  . Financial resource strain: Not on file  . Food insecurity - worry: Not on file  . Food insecurity - inability: Not on file  . Transportation needs - medical: Not on file  . Transportation needs - non-medical: Not on file  Occupational History  . Occupation: retired Theme park manager  Tobacco Use  . Smoking status: Never Smoker  . Smokeless tobacco: Never Used  Substance and Sexual Activity  . Alcohol use: No  . Drug use: No  . Sexual activity: No  Other Topics Concern  . Not on file  Social History Narrative   Lives alone    Family History:    Family History  Problem Relation Age of Onset  . Tuberculosis Father   . Alcoholism Brother   . Hypertension Son      ROS:  Please see the history of present illness.  ROS  All other ROS reviewed and negative.     Physical Exam/Data:   Vitals:   09/29/17 1100 09/29/17 1101 09/29/17 1102 09/29/17 1106  BP: 117/81     Pulse:      Resp: 20 19 18  (!) 22  Temp:      TempSrc:      SpO2: 98% 96% 98% 95%  Weight:      Height:       No intake or output data in the 24 hours ending 09/29/17 1229 Filed Weights   09/29/17 0943  Weight: 135 lb (61.2 kg)   Body mass index is 27.27 kg/m.  General:  Well nourished, well developed, elderly female in no acute distress HEENT: normal Lymph: no adenopathy Neck: no  JVD Endocrine:  No thryomegaly Vascular: No carotid bruits; FA pulses 2+ bilaterally without bruits  Cardiac:  normal S1, S2; irregularly irregular rhythm; systolic murmur in left chest Lungs:  clear to auscultation bilaterally, no wheezing, few crackles in right base Abd: soft, nontender, no hepatomegaly  Ext: no edema Musculoskeletal:  No deformities, BUE and BLE strength normal and equal Skin: warm and dry  Neuro:  CNs 2-12 intact, no focal abnormalities noted Psych:  Normal affect   EKG:  The EKG was personally reviewed and demonstrates:  Atrial fibrillation with RVR and probable rate related repolarization abnormality Telemetry:  Telemetry was personally reviewed and demonstrates:  afib in  the 130's-140's  Relevant CV Studies:  Echo ordered  Laboratory Data:  Chemistry Recent Labs  Lab 09/29/17 0948  NA 130*  K 3.8  CL 97*  CO2 23  GLUCOSE 202*  BUN 10  CREATININE 0.71  CALCIUM 9.5  GFRNONAA >60  GFRAA >60  ANIONGAP 10    No results for input(s): PROT, ALBUMIN, AST, ALT, ALKPHOS, BILITOT in the last 168 hours. Hematology Recent Labs  Lab 09/29/17 0948  WBC 17.4*  RBC 3.79*  HGB 13.0  HCT 37.5  MCV 98.9  MCH 34.3*  MCHC 34.7  RDW 14.8  PLT 405*   Cardiac EnzymesNo results for input(s): TROPONINI in the last 168 hours.  Recent Labs  Lab 09/29/17 1003  TROPIPOC 0.03    BNPNo results for input(s): BNP, PROBNP in the last 168 hours.  DDimer No results for input(s): DDIMER in the last 168 hours.  Radiology/Studies:  Dg Chest Port 1 View  Result Date: 09/29/2017 CLINICAL DATA:  New onset atrial fibrillation with chest pain EXAM: PORTABLE CHEST 1 VIEW COMPARISON:  02/12/2011 FINDINGS: Heart size within normal limits. Negative for heart failure. Elevated right hemidiaphragm as noted previously. Mild right lower lobe airspace disease is new since the prior study. Small right pleural effusion also new. Negative for edema. Advanced degenerative change right  shoulder with rotator cuff disease. IMPRESSION: Elevated right hemidiaphragm unchanged. Small area of right lower lobe atelectasis/ infiltrate and small right effusion. Negative for heart failure. Electronically Signed   By: Franchot Gallo M.D.   On: 09/29/2017 10:31    Assessment and Plan:   1. New onset atrial fibrillation with RVR -Pt did have a brief episode of afib with RVR in the setting of acute illness in 2012, apparently none since.  -Symptoms began less than 1 hour prior to arrival at the ED, sudden onset after having breakfast. -Labs are unremarkable except for hyponatremia which is chronic for her. TSH normal. -She has leukocytosis and CXR shows small area of right lower lobe atelectasis/ infiltrate and small right effusion. She has no symptoms of infection.  -The atrial fib may be a response to an infectious process, but not clear yet. -2 attempts at electrical cardioversion unsuccessful. Now on cardizem with rates still in the 130's-140's. BP is stable. Will start amiodarone.  -CHA2DS2/VAS Stroke Risk Score is at least 4 (HTN, Age (2), female). Advise proceeding with DOAC as planned by ED MD. Eliquis 5 mg bid. -Will check echo tomorrow after better rate/rhythm control. I did note a murmur, possible mitral, on exam.   2. Hypertension -BP currently stable. Monitor with new med changes.  -Continue metoprolol, hold HCTZ for now.     For questions or updates, please contact [place] Please consult [email or web address] for contact info under Cardiology/STEMI.   Signed, [name], NP  09/29/2017 12:29 PM

## 2017-09-29 NOTE — ED Notes (Signed)
Bed: WA04 Expected date:  Expected time:  Means of arrival:  Comments: 

## 2017-09-29 NOTE — ED Notes (Signed)
ED TO INPATIENT HANDOFF REPORT  Name/Age/Gender Dominique [name] 83 y.o. female  Code Status Code Status History    This patient does not have a recorded code status. Please follow your organizational policy for patients in this situation.    Advance Directive Documentation     Most Recent Value  Type of Advance Directive  Healthcare Power of Attorney, Living will  Pre-existing out of facility DNR order (yellow form or pink MOST form)  No data  "MOST" Form in Place?  No data      Home/SNF/Other Home  Chief Complaint shakey,chest pain, heart racing  Level of Care/Admitting Diagnosis ED Disposition    ED Disposition Condition [facility] Area: [place] [100102]  Level of Care: Telemetry [5]  Admit to tele based on following criteria: Complex arrhythmia (Bradycardia/Tachycardia)  Diagnosis: A-fib [facility]) [[id number]]  Admitting Physician: Sueanne Margarita [phone]  Attending Physician: Sueanne Margarita [phone]  PT Class (Do Not Modify): Observation [104]  PT Acc Code (Do Not Modify): Observation [10022]       Medical History Past Medical History:  Diagnosis Date  . A-fib ([place])   . Anemia   . Cancer ([place])    uterine  . Hypertension   . Shingles   . Thrombocytopenia ([place])   . Thyroid disease     Allergies Allergies  Allergen Reactions  . Codeine   . Darvocet [Propoxyphene N-Acetaminophen]   . Demerol   . Percocet [Oxycodone-Acetaminophen]   . Prednisone     Uncertain this is true. Was documented when patient was admitted 02/12/2012, but not recalled by either the patient nor her son. Tolerating it easily 08/2016.     IV Location/Drains/Wounds Patient Lines/Drains/Airways Status   Active Line/Drains/Airways    Name:   Placement date:   Placement time:   Site:   Days:   Peripheral IV 09/29/17 Left Antecubital   09/29/17    0951    Antecubital   less than 1          Labs/Imaging Results for orders placed or performed during the  hospital encounter of 09/29/17 (from the past 48 hour(s))  Basic metabolic panel     Status: Abnormal   Collection Time: 09/29/17  9:48 AM  Result Value Ref Range   Sodium 130 (L) 135 - 145 mmol/L   Potassium 3.8 3.5 - 5.1 mmol/L   Chloride 97 (L) 101 - 111 mmol/L   CO2 23 22 - 32 mmol/L   Glucose, Bld 202 (H) 65 - 99 mg/dL   BUN 10 6 - 20 mg/dL   Creatinine, Ser 0.71 0.44 - 1.00 mg/dL   Calcium 9.5 8.9 - 10.3 mg/dL   GFR calc non Af Amer >60 >60 mL/min   GFR calc Af Amer >60 >60 mL/min    Comment: (NOTE) The eGFR has been calculated using the CKD EPI equation. This calculation has not been validated in all clinical situations. eGFR's persistently <60 mL/min signify possible Chronic Kidney Disease.    Anion gap 10 5 - 15  Magnesium     Status: None   Collection Time: 09/29/17  9:48 AM  Result Value Ref Range   Magnesium 1.8 1.7 - 2.4 mg/dL  CBC     Status: Abnormal   Collection Time: 09/29/17  9:48 AM  Result Value Ref Range   WBC 17.4 (H) 4.0 - 10.5 K/uL   RBC 3.79 (L) 3.87 - 5.11 MIL/uL   Hemoglobin 13.0 12.0 -  15.0 g/dL   HCT 37.5 36.0 - 46.0 %   MCV 98.9 78.0 - 100.0 fL   MCH 34.3 (H) 26.0 - 34.0 pg   MCHC 34.7 30.0 - 36.0 g/dL   RDW 14.8 11.5 - 15.5 %   Platelets 405 (H) 150 - 400 K/uL  TSH     Status: None   Collection Time: 09/29/17  9:48 AM  Result Value Ref Range   TSH 2.640 0.350 - 4.500 uIU/mL    Comment: Performed by a 3rd Generation assay with a functional sensitivity of <=0.01 uIU/mL.  I-stat troponin, ED     Status: None   Collection Time: 09/29/17 10:03 AM  Result Value Ref Range   Troponin i, poc 0.03 0.00 - 0.08 ng/mL   Comment 3            Comment: Due to the release kinetics of cTnI, a negative result within the first hours of the onset of symptoms does not rule out myocardial infarction with certainty. If myocardial infarction is still suspected, repeat the test at appropriate intervals.   Urinalysis, Routine w reflex microscopic     Status:  Abnormal   Collection Time: 09/29/17 12:52 PM  Result Value Ref Range   Color, Urine YELLOW YELLOW   APPearance CLEAR CLEAR   Specific Gravity, Urine 1.008 1.005 - 1.030   pH 7.0 5.0 - 8.0   Glucose, UA NEGATIVE NEGATIVE mg/dL   Hgb urine dipstick NEGATIVE NEGATIVE   Bilirubin Urine NEGATIVE NEGATIVE   Ketones, ur NEGATIVE NEGATIVE mg/dL   Protein, ur NEGATIVE NEGATIVE mg/dL   Nitrite NEGATIVE NEGATIVE   Leukocytes, UA TRACE (A) NEGATIVE   RBC / HPF 0-5 0 - 5 RBC/hpf   WBC, UA 6-30 0 - 5 WBC/hpf   Bacteria, UA RARE (A) NONE SEEN   Squamous Epithelial / LPF NONE SEEN NONE SEEN   Mucus PRESENT    Dg Chest Port 1 View  Result Date: 09/29/2017 CLINICAL DATA:  New onset atrial fibrillation with chest pain EXAM: PORTABLE CHEST 1 VIEW COMPARISON:  02/12/2011 FINDINGS: Heart size within normal limits. Negative for heart failure. Elevated right hemidiaphragm as noted previously. Mild right lower lobe airspace disease is new since the prior study. Small right pleural effusion also new. Negative for edema. Advanced degenerative change right shoulder with rotator cuff disease. IMPRESSION: Elevated right hemidiaphragm unchanged. Small area of right lower lobe atelectasis/ infiltrate and small right effusion. Negative for heart failure. Electronically Signed   By: Franchot Gallo M.D.   On: 09/29/2017 10:31    Pending Labs Unresulted Labs (From admission, onward)   None      Vitals/Pain Today's Vitals   09/29/17 1615 09/29/17 1630 09/29/17 1646 09/29/17 1940  BP:   (!) 145/75 (!) 149/63  Pulse:   72 65  Resp: _0 Temp:    98.3 F (36.8 C)  TempSrc:    Oral  SpO2: 95% 97% 100% 96%  Weight:      Height:      PainSc:        Isolation Precautions No active isolations  Medications Medications  0.9 %  sodium chloride infusion ( Intravenous New Bag/Given 09/29/17 1744)  diltiazem (CARDIZEM) 1 mg/mL load via infusion 10 mg (10 mg Intravenous Bolus from Bag 09/29/17 1100)     And  diltiazem (CARDIZEM) 100 mg in dextrose 5% 176m (1 mg/mL) infusion (0 mg/hr Intravenous Stopped 09/29/17 1737)  amiodarone (NEXTERONE) 1.8 mg/mL load via infusion 150  mg (150 mg Intravenous Bolus from Bag 09/29/17 1319)    Followed by  amiodarone (NEXTERONE PREMIX) 360-4.14 MG/200ML-% (1.8 mg/mL) IV infusion (0 mg/hr Intravenous Stopped 09/29/17 1915)    Followed by  amiodarone (NEXTERONE PREMIX) 360-4.14 MG/200ML-% (1.8 mg/mL) IV infusion (30 mg/hr Intravenous New Bag/Given 09/29/17 1918)  apixaban (ELIQUIS) tablet 5 mg (5 mg Oral Given 09/29/17 1645)  midazolam (VERSED) injection 2 mg (2 mg Intravenous Given 09/29/17 1036)    Mobility walks

## 2017-09-29 NOTE — ED Provider Notes (Signed)
[facility] DEPT Provider Note   CSN: [id number] Arrival date & time: 09/29/17  8101     History   Chief Complaint Chief Complaint  Patient presents with  . Chest Pain    HPI Dominique [name] is a 83 y.o. female.  HPI  Patient presents with lightheadedness, palpitations. Symptoms began less than 1 hour prior to ED arrival. Patient was eating breakfast, in her usual state of health, when she began feeling poorly. No pain, no syncope, no nausea, vomiting. Patient denies substantial medical problems, states that she is a healthy lady.  She denies a history of atrial fibrillation, or any other cardiac disease.  Chart review after initial evaluation, intervention notable for previously documented history of atrial fibrillation with rapid ventricular response.  She is here with her son who assists with the HPI. He is unaware of cardiac history as well. She does not take blood thinning medication.  She does take aspirin daily Since onset no clear alleviating or exacerbating factors, though she is marginally better at rest.   Past Medical History:  Diagnosis Date  . A-fib ([place])   . Anemia   . Cancer ([place])    uterine  . Hypertension   . Shingles   . Thrombocytopenia ([place])   . Thyroid disease     Patient Active Problem List   Diagnosis Date Noted  . History of uterine cancer 09/02/2016  . Benign essential HTN 09/02/2016  . Hypothyroidism 09/02/2016  . History of shingles 09/02/2016  . History of iron deficiency anemia 09/02/2016  . History of thrombocytopenia 09/02/2016  . History of atrial fibrillation 09/02/2016    Past Surgical History:  Procedure Laterality Date  . ABDOMINAL HYSTERECTOMY    . JOINT REPLACEMENT Bilateral    knees    OB History    No data available       Home Medications    Prior to Admission medications   Medication Sig Start Date End Date Taking? Authorizing Provider  aspirin EC 81 MG tablet Take 81 mg by  mouth daily.    [provider]  hydrochlorothiazide (HYDRODIURIL) 25 MG tablet Take 25 mg by mouth daily.    [provider]  HYDROcodone-acetaminophen (NORCO/VICODIN) 5-325 MG per tablet Take 1 tablet by mouth every 4 (four) hours as needed. 05/20/15   Larene Pickett, PA-C  ibuprofen (ADVIL,MOTRIN) 200 MG tablet Take 200 mg by mouth every 6 (six) hours as needed for mild pain.    [provider]  levothyroxine (SYNTHROID, LEVOTHROID) 50 MCG tablet Take 50 mcg by mouth daily before breakfast.    [provider]  metoprolol succinate (TOPROL-XL) 50 MG 24 hr tablet Take 50 mg by mouth daily. Take with or immediately following a meal.    [provider]  predniSONE (DELTASONE) 20 MG tablet  08/31/16   [provider]  simvastatin (ZOCOR) 20 MG tablet Take 20 mg by mouth daily.    [provider]    Family History History reviewed. No pertinent family history.  Social History Social History   Tobacco Use  . Smoking status: Never Smoker  . Smokeless tobacco: Never Used  Substance Use Topics  . Alcohol use: No  . Drug use: No     Allergies   Codeine; Darvocet [propoxyphene n-acetaminophen]; Demerol; Percocet [oxycodone-acetaminophen]; and Prednisone   Review of Systems Review of Systems  Constitutional:       Per HPI, otherwise negative  HENT:  Per HPI, otherwise negative  Respiratory:       Per HPI, otherwise negative  Cardiovascular:       Per HPI, otherwise negative  Gastrointestinal: Negative for vomiting.  Endocrine:       Negative aside from HPI  Genitourinary:       Neg aside from HPI   Musculoskeletal:       Per HPI, otherwise negative  Skin: Negative.   Neurological: Positive for weakness. Negative for syncope.     Physical Exam Updated Vital Signs BP (!) 140/97   Pulse (!) 185   Temp 98 F (36.7 C) (Oral)   Resp (!) 24   Ht 4\' 11"  (1.499 m)   Wt 61.2 kg (135 lb)   SpO2 97%   BMI 27.27  kg/m   Physical Exam  Constitutional: She is oriented to person, place, and time. She has a sickly appearance. No distress.  HENT:  Head: Normocephalic and atraumatic.  Eyes: Conjunctivae and EOM are normal.  Cardiovascular: An irregularly irregular rhythm present. Tachycardia present.  Pulmonary/Chest: Effort normal and breath sounds normal. No stridor. No respiratory distress.  Abdominal: She exhibits no distension.  Musculoskeletal: She exhibits no edema.  Neurological: She is alert and oriented to person, place, and time. She displays atrophy. She displays no tremor. No cranial nerve deficit. She exhibits normal muscle tone. She displays no seizure activity.  Skin: Skin is warm and dry.  Psychiatric: She is withdrawn. Cognition and memory are impaired. She exhibits abnormal remote memory.  Nursing note and vitals reviewed.    ED Treatments / Results  Labs (all labs ordered are listed, but only abnormal results are displayed) Labs Reviewed  BASIC METABOLIC PANEL - Abnormal; Notable for the following components:      Result Value   Sodium 130 (*)    Chloride 97 (*)    Glucose, Bld 202 (*)    All other components within normal limits  CBC - Abnormal; Notable for the following components:   WBC 17.4 (*)    RBC 3.79 (*)    MCH 34.3 (*)    Platelets 405 (*)    All other components within normal limits  MAGNESIUM  TSH  I-STAT TROPONIN, ED    EKG  EKG Interpretation  Date/Time:  Wednesday September 29 2017 09:33:31 EST Ventricular Rate:  169 PR Interval:    QRS Duration: 69 QT Interval:  262 QTC Calculation: 440 R Axis:   34 Text Interpretation:  Atrial fibrillation with rapid V-rate Repolarization abnormality, prob rate related Abnormal ekg Confirmed by Carmin Muskrat [phone]) on 09/29/2017 9:51:39 AM       Radiology Dg Chest Port 1 View  Result Date: 09/29/2017 CLINICAL DATA:  New onset atrial fibrillation with chest pain EXAM: PORTABLE CHEST 1 VIEW COMPARISON:   02/12/2011 FINDINGS: Heart size within normal limits. Negative for heart failure. Elevated right hemidiaphragm as noted previously. Mild right lower lobe airspace disease is new since the prior study. Small right pleural effusion also new. Negative for edema. Advanced degenerative change right shoulder with rotator cuff disease. IMPRESSION: Elevated right hemidiaphragm unchanged. Small area of right lower lobe atelectasis/ infiltrate and small right effusion. Negative for heart failure. Electronically Signed   By: Franchot Gallo M.D.   On: 09/29/2017 10:31    Procedures .Cardioversion Date/Time: 09/29/2017 10:50 AM Performed by: Carmin Muskrat, MD Authorized by: Carmin Muskrat, MD   Consent:    Consent obtained:  Verbal and emergent situation   Consent given by:  Patient   Risks discussed:  Induced arrhythmia, death and pain   Alternatives discussed:  Alternative treatment, rate-control medication, no treatment and delayed treatment Pre-procedure details:    Cardioversion basis:  Emergent   Rhythm:  Atrial fibrillation   Electrode placement:  Anterior-posterior Patient sedated: Yes. Refer to sedation procedure documentation for details of sedation.  Attempt one:    Cardioversion mode:  Synchronous   Waveform:  Biphasic   Shock (Joules):  120   Shock outcome:  No change in rhythm Attempt two:    Cardioversion mode:  Synchronous   Waveform:  Biphasic   Shock (Joules):  150   Shock outcome:  No change in rhythm Post-procedure details:    Patient status:  Awake   Patient tolerance of procedure:  Tolerated well, no immediate complications   (including critical care time)  Medications Ordered in ED Medications  0.9 %  sodium chloride infusion ( Intravenous New Bag/Given 09/29/17 1037)  apixaban (ELIQUIS) tablet 5 mg (not administered)  diltiazem (CARDIZEM) 1 mg/mL load via infusion 10 mg (not administered)    And  diltiazem (CARDIZEM) 100 mg in dextrose 5% 193mL (1 mg/mL)  infusion (not administered)  midazolam (VERSED) injection 2 mg (2 mg Intravenous Given 09/29/17 1036)     Initial Impression / Assessment and Plan / ED Course  I have reviewed the triage vital signs and the nursing notes.  Pertinent labs & imaging results that were available during my care of the patient were reviewed by me and considered in my medical decision making (see chart for details).   Update: Initial results notable for leukocytosis, but no substantial electrolyte abnormalities. I discussed risks and benefits of electrical cardioversion with the patient and her son. Both acknowledge these, and elected to proceed with the procedure.  10:51 AM Following 2 attempts at electrical cardioversion, with no change in rhythm, though with no adverse consequences, the patient will receive diltiazem, bolus, infusion.  Elderly female presents with weakness, palpitations in spite of atrial fibrillation with rapid ventricular response per Though she does not acknowledge a history of cardiac disease, on chart review does appear that she has had this previously. She takes beta-blocker at home, aspirin, but no other anticoagulant, no other rate control medication. Initial evaluation here reassuring, but the patient did not have successful reversion with shock. Patient required initiation of diltiazem bolus, drip for rate control.  Patient does have mild leukocytosis, has had this multiple prior times. Here she is afebrile, with no cough, no hypoxia, no tachypnea, there is low suspicion for pneumonia. X-ray does demonstrate possible effusion, this will require additional evaluation.  1:48 PM Following minimal improvement with Cardizem, patient had amiodarone, after evaluation by cardiology colleagues as well. Patient has had conversion to sinus rhythm, after addition of amiodarone to Cardizem. She states that she feels better.  On heart rate in the 90s. Patient still requires admission for  further evaluation, management of her refractory atrial fibrillation with rapid ventricular response.  Final Clinical Impressions(s) / ED Diagnoses  Atrial fibrillation with rapid ventricular response  CRITICAL CARE Performed by: Carmin Muskrat Total critical care time: 35 minutes Critical care time was exclusive of separately billable procedures and treating other patients. Critical care was necessary to treat or prevent imminent or life-threatening deterioration. Critical care was time spent personally by me on the following activities: development of treatment plan with patient and/or surrogate as well as nursing, discussions with consultants, evaluation of patient's response to treatment, examination of patient, obtaining history  from patient or surrogate, ordering and performing treatments and interventions, ordering and review of laboratory studies, ordering and review of radiographic studies, pulse oximetry and re-evaluation of patient's condition.    Carmin Muskrat, MD 09/29/17 [phone]

## 2017-09-29 NOTE — Progress Notes (Addendum)
ANTICOAGULATION CONSULT NOTE - Initial Consult  Pharmacy Consult for apixaban Indication: atrial fibrillation  Allergies  Allergen Reactions  . Codeine   . Darvocet [Propoxyphene N-Acetaminophen]   . Demerol   . Percocet [Oxycodone-Acetaminophen]   . Prednisone     Uncertain this is true. Was documented when patient was admitted 02/12/2012, but not recalled by either the patient nor her son. Tolerating it easily 08/2016.     Patient Measurements: Height: 4\' 11"  (149.9 cm) Weight: 135 lb (61.2 kg) IBW/kg (Calculated) : 43.2 Heparin Dosing Weight:   Vital Signs: Temp: 98 F (36.7 C) (12/19 0939) Temp Source: Oral (12/19 0939) BP: 149/107 (12/19 0939) Pulse Rate: 185 (12/19 0939)  Labs: No results for input(s): HGB, HCT, PLT, APTT, LABPROT, INR, HEPARINUNFRC, HEPRLOWMOCWT, CREATININE, CKTOTAL, CKMB, TROPONINI in the last 72 hours.  CrCl cannot be calculated (Patient's most recent lab result is older than the maximum 21 days allowed.).   Medical History: Past Medical History:  Diagnosis Date  . A-fib ([place])   . Anemia   . Cancer ([place])    uterine  . Hypertension   . Shingles   . Thrombocytopenia ([place])   . Thyroid disease     Assessment: 31 YOF present with chest pain and SOB with afib/RVR.  Patient cardioverted in ED.  Consult placed for pharmacy to dose apixaban.     CBC: Hgb and pltc WNL  Renal: Scr = 0.71  Goal of Therapy:  Dose per patient-specific parameters.  Meets age criteria (>80y) for dose reduction with borderline weight = 61.2 kg.  Since weight > 60kg, only meets 1 of the 3 criteria to reduce dose to 2.5mg  PO BID  Plan:   apixaban 5mg  PO BID - 1st dose now  This will be same dose for discharge  recommend hold ASA while on apixaban  Pharmacist to provide education and coupon for 30-day free supply of apixaban  Doreene Eland, PharmD, BCPS.   Pager: [phone] 09/29/2017 10:03 AM   Addendum:  Discussed with Dr. Vanita Panda, cardioversion  unsuccessful in ED so will not be discharged from ED on apixaban. CHASD2VASC score is 4  Plan:  apixaban 5mg  PO BID as only meets 1 of 3 criteria for lower dose.    Monitor for bleeding  Pharmacist education prior to discharge  Doreene Eland, PharmD, BCPS.   Pager: [phone] 09/29/2017 3:17 PM

## 2017-09-29 NOTE — ED Triage Notes (Addendum)
Patient c/o sudden onset of left chest pain and SOB since 0900 today. Patient denies diaphoresis or N/V.

## 2017-09-30 ENCOUNTER — Observation Stay (HOSPITAL_BASED_OUTPATIENT_CLINIC_OR_DEPARTMENT_OTHER): Payer: Medicare Other

## 2017-09-30 ENCOUNTER — Other Ambulatory Visit (HOSPITAL_COMMUNITY): Payer: Medicare Other

## 2017-09-30 DIAGNOSIS — I10 Essential (primary) hypertension: Secondary | ICD-10-CM | POA: Diagnosis not present

## 2017-09-30 DIAGNOSIS — I4891 Unspecified atrial fibrillation: Secondary | ICD-10-CM | POA: Diagnosis not present

## 2017-09-30 LAB — CBC
HEMATOCRIT: 35.3 % — AB (ref 36.0–46.0)
Hemoglobin: 11.9 g/dL — ABNORMAL LOW (ref 12.0–15.0)
MCH: 33.3 pg (ref 26.0–34.0)
MCHC: 33.7 g/dL (ref 30.0–36.0)
MCV: 98.9 fL (ref 78.0–100.0)
Platelets: 368 10*3/uL (ref 150–400)
RBC: 3.57 MIL/uL — ABNORMAL LOW (ref 3.87–5.11)
RDW: 14.8 % (ref 11.5–15.5)
WBC: 17 10*3/uL — ABNORMAL HIGH (ref 4.0–10.5)

## 2017-09-30 LAB — ECHOCARDIOGRAM COMPLETE
Height: 59 in
Weight: 2160 oz

## 2017-09-30 MED ORDER — APIXABAN 5 MG PO TABS: 5.0000 mg | ORAL_TABLET | Freq: Two times a day (BID) | ORAL | 0 refills | Status: DC

## 2017-09-30 MED ORDER — METOPROLOL TARTRATE 50 MG PO TABS: 50.0000 mg | ORAL_TABLET | Freq: Two times a day (BID) | ORAL | 6 refills | Status: DC

## 2017-09-30 MED ORDER — METOPROLOL TARTRATE 50 MG PO TABS
50.0000 mg | ORAL_TABLET | Freq: Two times a day (BID) | ORAL | Status: DC
  Administered 2017-09-30: 50 mg via ORAL
  Filled 2017-09-30: qty 1

## 2017-09-30 MED ORDER — APIXABAN 5 MG PO TABS: 5.0000 mg | ORAL_TABLET | Freq: Two times a day (BID) | ORAL | 6 refills | Status: DC

## 2017-09-30 NOTE — Discharge Instructions (Addendum)
*Make appointment with PCP tomorrow due to WBC count being elevated. May need repeat CXR.  Take the Eliquis twice a day, do not miss any.  This is to help prevent stroke.  Call the office or go to ER if you have recurrent problems.  Heart Healthy Diet   We stopped your asprin since we started the Eliquis.   Information on my medicine - ELIQUIS (apixaban)   Atrial Fibrillation Atrial fibrillation is a type of irregular or rapid heartbeat (arrhythmia). In atrial fibrillation, the heart quivers continuously in a chaotic pattern. This occurs when parts of the heart receive disorganized signals that make the heart unable to pump blood normally. This can increase the risk for stroke, heart failure, and other heart-related conditions. There are different types of atrial fibrillation, including:  Paroxysmal atrial fibrillation. This type starts suddenly, and it usually stops on its own shortly after it starts.  Persistent atrial fibrillation. This type often lasts longer than a week. It may stop on its own or with treatment.  Long-lasting persistent atrial fibrillation. This type lasts longer than 12 months.  Permanent atrial fibrillation. This type does not go away.  Talk with your health care provider to learn about the type of atrial fibrillation that you have. What are the causes? This condition is caused by some heart-related conditions or procedures, including:  A heart attack.  Coronary artery disease.  Heart failure.  Heart valve conditions.  High blood pressure.  Inflammation of the sac that surrounds the heart (pericarditis).  Heart surgery.  Certain heart rhythm disorders, such as Wolf-Parkinson-White syndrome.  Other causes include:  Pneumonia.  Obstructive sleep apnea.  Blockage of an artery in the lungs (pulmonary embolism, or PE).  Lung cancer.  Chronic lung disease.  Thyroid problems, especially if the thyroid is overactive  (hyperthyroidism).  Caffeine.  Excessive alcohol use or illegal drug use.  Use of some medicines, including certain decongestants and diet pills.  Sometimes, the cause cannot be found. What increases the risk? This condition is more likely to develop in:  People who are older in age.  People who smoke.  People who have diabetes mellitus.  People who are overweight (obese).  Athletes who exercise vigorously.  What are the signs or symptoms? Symptoms of this condition include:  A feeling that your heart is beating rapidly or irregularly.  A feeling of discomfort or pain in your chest.  Shortness of breath.  Sudden light-headedness or weakness.  Getting tired easily during exercise.  In some cases, there are no symptoms. How is this diagnosed? Your health care provider may be able to detect atrial fibrillation when taking your pulse. If detected, this condition may be diagnosed with:  An electrocardiogram (ECG).  A Holter monitor test that records your heartbeat patterns over a 24-hour period.  Transthoracic echocardiogram (TTE) to evaluate how blood flows through your heart.  Transesophageal echocardiogram (TEE) to view more detailed images of your heart.  A stress test.  Imaging tests, such as a CT scan or chest X-ray.  Blood tests.  How is this treated? The main goals of treatment are to prevent blood clots from forming and to keep your heart beating at a normal rate and rhythm. The type of treatment that you receive depends on many factors, such as your underlying medical conditions and how you feel when you are experiencing atrial fibrillation. This condition may be treated with:  Medicine to slow down the heart rate, bring the hearts rhythm back to normal,  or prevent clots from forming.  Electrical cardioversion. This is a procedure that resets your hearts rhythm by delivering a controlled, low-energy shock to the heart through your skin.  Different  types of ablation, such as catheter ablation, catheter ablation with pacemaker, or surgical ablation. These procedures destroy the heart tissues that send abnormal signals. When the pacemaker is used, it is placed under your skin to help your heart beat in a regular rhythm.  Follow these instructions at home:  Take over-the counter and prescription medicines only as told by your health care provider.  If your health care provider prescribed a blood-thinning medicine (anticoagulant), take it exactly as told. Taking too much blood-thinning medicine can cause bleeding. If you do not take enough blood-thinning medicine, you will not have the protection that you need against stroke and other problems.  Do not use tobacco products, including cigarettes, chewing tobacco, and e-cigarettes. If you need help quitting, ask your health care provider.  If you have obstructive sleep apnea, manage your condition as told by your health care provider.  Do not drink alcohol.  Do not drink beverages that contain caffeine, such as coffee, soda, and tea.  Maintain a healthy weight. Do not use diet pills unless your health care provider approves. Diet pills may make heart problems worse.  Follow diet instructions as told by your health care provider.  Exercise regularly as told by your health care provider.  Keep all follow-up visits as told by your health care provider. This is important. How is this prevented?  Avoid drinking beverages that contain caffeine or alcohol.  Avoid certain medicines, especially medicines that are used for breathing problems.  Avoid certain herbs and herbal medicines, such as those that contain ephedra or ginseng.  Do not use illegal drugs, such as cocaine and amphetamines.  Do not smoke.  Manage your high blood pressure. Contact a health care provider if:  You notice a change in the rate, rhythm, or strength of your heartbeat.  You are taking an anticoagulant and you  notice increased bruising.  You tire more easily when you exercise or exert yourself. Get help right away if:  You have chest pain, abdominal pain, sweating, or weakness.  You feel nauseous.  You notice blood in your vomit, bowel movement, or urine.  You have shortness of breath.  You suddenly have swollen feet and ankles.  You feel dizzy.  You have sudden weakness or numbness of the face, arm, or leg, especially on one side of the body.  You have trouble speaking, trouble understanding, or both (aphasia).  Your face or your eyelid droops on one side. These symptoms may represent a serious problem that is an emergency. Do not wait to see if the symptoms will go away. Get medical help right away. Call your local emergency services (911 in the [place] Do not drive yourself to the hospital. This information is not intended to replace advice given to you by your health care provider. Make sure you discuss any questions you have with your health care provider. Document Released: 09/28/2005 Document Revised: 02/05/2016 Document Reviewed: 01/23/2015 Elsevier Interactive Patient Education  [employer].   This medication education was reviewed with me or my healthcare representative as part of my discharge preparation.  The pharmacist that spoke with me during my hospital stay was:  WOFFORD, DREW A, RPH     Why was Eliquis prescribed for you? Eliquis was prescribed for you to reduce the risk of a blood clot  forming that can cause a stroke if you have a medical condition called atrial fibrillation (a type of irregular heartbeat).  What do You need to know about Eliquis ? Take your Eliquis TWICE DAILY - one tablet in the morning and one tablet in the evening with or without food. If you have difficulty swallowing the tablet whole please discuss with your pharmacist how to take the medication safely.  Take Eliquis exactly as prescribed by your doctor and DO NOT stop taking  Eliquis without talking to the doctor who prescribed the medication.  Stopping may increase your risk of developing a stroke.  Refill your prescription before you run out.  After discharge, you should have regular check-up appointments with your healthcare provider that is prescribing your Eliquis.  In the future your dose may need to be changed if your kidney function or weight changes by a significant amount or as you get older.  What do you do if you miss a dose? If you miss a dose, take it as soon as you remember on the same day and resume taking twice daily.  Do not take more than one dose of ELIQUIS at the same time to make up a missed dose.  Important Safety Information A possible side effect of Eliquis is bleeding. You should call your healthcare provider right away if you experience any of the following: ? Bleeding from an injury or your nose that does not stop. ? Unusual colored urine (red or dark brown) or unusual colored stools (red or black). ? Unusual bruising for unknown reasons. ? A serious fall or if you hit your head (even if there is no bleeding).  Some medicines may interact with Eliquis and might increase your risk of bleeding or clotting while on Eliquis. To help avoid this, consult your healthcare provider or pharmacist prior to using any new prescription or non-prescription medications, including herbals, vitamins, non-steroidal anti-inflammatory drugs (NSAIDs) and supplements.  This website has more information on Eliquis (apixaban): http://www.eliquis.com/eliquis/home

## 2017-09-30 NOTE — Discharge Summary (Signed)
Discharge Summary    Patient ID: Dominique [name],  MRN: [id number], DOB/AGE: 04/09/1933 83 y.o.  Admit date: 09/29/2017 Discharge date: 09/30/2017  Primary Care Provider: [name] Primary Cardiologist: Dr. Radford Pax   Discharge Diagnoses    Principal Problem:   Atrial fibrillation with RVR [facility]) Active Problems:   Benign essential HTN   A-fib (HCC)   Allergies Allergies  Allergen Reactions  . Codeine   . Darvocet [Propoxyphene N-Acetaminophen]   . Demerol   . Percocet [Oxycodone-Acetaminophen]   . Prednisone     Uncertain this is true. Was documented when patient was admitted 02/12/2012, but not recalled by either the patient nor her son. Tolerating it easily 08/2016.     Diagnostic Studies/Procedures    ECHO was stable and read and reviewed by Dr. Radford Pax.   _____________   History of Present Illness     83 y.o. female with a hx of PAF in setting acute illness in 2012, HTN, dyslipidemia, hypothyroidism, distant uterine cancer, thrombocytopenia, osteoarthritis s/p RTKA 2011, hx of shingles, hx of hyponatremia and hypokaliemia to [facility] 09/29/17 with lightheadedness and palpitations that began an hour prior to admit.  Her symptoms began suddenly after taking her meds.  She felt shaky and and felt her heart pounding.  She did have mild chest pressure.  In ER 2 attempts of DCCV were attempted without success.  She was then placed on diltiazem drip and Eliquis.  Her WBC was 17.4, other labs normal.  CHA2DS2/VAS is 4.  She was admitted for further eval.   Hospital Course     Consultants: none   Pt did convert to SR.  Today 09/30/17 pt was feeling much better in SR.  The dilt drip was stopped and metoprolol was resumed.  She will continue eliquis.  She will follow up in a fib clinic.  Echo stable and pt is stable for discharge. Dr. Radford Pax has reviewed.   _____________  Discharge Vitals Blood pressure (!) 153/63, pulse 71, temperature 98.2 F (36.8 C),  temperature source Oral, resp. rate 16, height 4\' 11"  (1.499 m), weight 135 lb (61.2 kg), SpO2 100 %.  Filed Weights   09/29/17 0943  Weight: 135 lb (61.2 kg)    Labs & Radiologic Studies    CBC Recent Labs    09/29/17 0948  WBC 17.4*  HGB 13.0  HCT 37.5  MCV 98.9  PLT 245*   Basic Metabolic Panel Recent Labs    09/29/17 0948  NA 130*  K 3.8  CL 97*  CO2 23  GLUCOSE 202*  BUN 10  CREATININE 0.71  CALCIUM 9.5  MG 1.8   Liver Function Tests No results for input(s): AST, ALT, ALKPHOS, BILITOT, PROT, ALBUMIN in the last 72 hours. No results for input(s): LIPASE, AMYLASE in the last 72 hours. Cardiac Enzymes No results for input(s): CKTOTAL, CKMB, CKMBINDEX, TROPONINI in the last 72 hours. BNP Invalid input(s): POCBNP D-Dimer No results for input(s): DDIMER in the last 72 hours. Hemoglobin A1C No results for input(s): HGBA1C in the last 72 hours. Fasting Lipid Panel No results for input(s): CHOL, HDL, LDLCALC, TRIG, CHOLHDL, LDLDIRECT in the last 72 hours. Thyroid Function Tests Recent Labs    09/29/17 0948  TSH 2.640   _____________  Dg Chest Port 1 View  Result Date: 09/29/2017 CLINICAL DATA:  New onset atrial fibrillation with chest pain EXAM: PORTABLE CHEST 1 VIEW COMPARISON:  02/12/2011 FINDINGS: Heart size within normal limits. Negative for heart failure. Elevated  right hemidiaphragm as noted previously. Mild right lower lobe airspace disease is new since the prior study. Small right pleural effusion also new. Negative for edema. Advanced degenerative change right shoulder with rotator cuff disease. IMPRESSION: Elevated right hemidiaphragm unchanged. Small area of right lower lobe atelectasis/ infiltrate and small right effusion. Negative for heart failure. Electronically Signed   By: Franchot Gallo M.D.   On: 09/29/2017 10:31   Disposition   Pt is being discharged home today in good condition.  Follow-up Plans & Appointments    Follow-up Information      Sherran Needs, NP Follow up on 10/15/2017.   Specialties:  Nurse Practitioner, Cardiology Why:  at 11:30 AM   this is at [facility],  call the phone number above for directions.   Contact information: [place] [address] [phone]          Discharge Instructions    Amb referral to AFIB Clinic   Complete by:  As directed     Take the Eliquis twice a day, do not miss any.  This is to help prevent stroke.  Call the office or go to ER if you have recurrent problems.  Heart Healthy Diet   We stopped your asprin since we started the Eliquis.    Discharge Medications   Allergies as of 09/30/2017      Reactions   Codeine    Darvocet [propoxyphene N-acetaminophen]    Demerol    Percocet [oxycodone-acetaminophen]    Prednisone    Uncertain this is true. Was documented when patient was admitted 02/12/2012, but not recalled by either the patient nor her son. Tolerating it easily 08/2016.       Medication List    STOP taking these medications   aspirin EC 81 MG tablet   hydrochlorothiazide 25 MG tablet Commonly known as:  HYDRODIURIL   HYDROcodone-acetaminophen 5-325 MG tablet Commonly known as:  NORCO/VICODIN   ibuprofen 200 MG tablet Commonly known as:  ADVIL,MOTRIN     TAKE these medications   apixaban 5 MG Tabs tablet Commonly known as:  ELIQUIS Take 1 tablet (5 mg total) by mouth 2 (two) times daily.   fexofenadine 180 MG tablet Commonly known as:  ALLEGRA Take 180 mg by mouth daily.   levothyroxine 50 MCG tablet Commonly known as:  SYNTHROID, LEVOTHROID Take 50 mcg by mouth daily before breakfast.   metoprolol tartrate 50 MG tablet Commonly known as:  LOPRESSOR Take 1 tablet (50 mg total) by mouth 2 (two) times daily. What changed:  when to take this   simvastatin 20 MG tablet Commonly known as:  ZOCOR Take 20 mg by mouth daily.          Outstanding Labs/Studies     Duration of Discharge Encounter   Greater than 30  minutes including physician time.  Signed, Cecilie Kicks NP 09/30/2017, 2:33 PM

## 2017-09-30 NOTE — Progress Notes (Signed)
2D echo showed normal LVF with moderately dilated LA.  Patient stable for discharge and followup in afib clinic.

## 2017-09-30 NOTE — H&P (Signed)
Attestation signed by Dominique Margarita, MD at 09/29/2017 2:57 PM (Updated)  Patient seen and independently examined with Dominique Perch, NP. We discussed all aspects of the encounter. I agree with the assessment and plan as stated above. Patient with remote history of PAF in the setting of an acute illness and also with a history of HTN and dyslipidemia who presented with acute onset of dizziness and palpitations as well as chest discomfort this am after getting up feeling fine.  She does occasionally drink ETOH and had some wine last night.  She was found to be in afib with RVR and DCCV was attempted x 2 in the ER without success.  She was started on IV Cardizem gtt but HR still in the 140's with SBP 100-149mmHg.  She denies any h/o CP prior to this or SOB, DOE, PND or orthopnea.    GEN: Well nourished, well developed in no acute distress HEENT: Normal NECK: No JVD; No carotid bruits LYMPHATICS: No lymphadenopathy CARDIAC:irregularly irregular and tachy, no murmurs, rubs, gallops RESPIRATORY:  Clear to auscultation without rales, wheezing or rhonchi  ABDOMEN: Soft, non-tender, non-distended MUSCULOSKELETAL:  No edema; No deformity  SKIN: Warm and dry NEUROLOGIC:  Alert and oriented x 3 PSYCHIATRIC:  Normal affect   Troponin 0.03.  EKG with afib with RVR and nonspecific ST abnormality likely rate dependent.  Cxray with no edema but did show smallarea of right lower lobe atelectasis/ infiltrate and small right effusion and WBC elevated to 17.4 so ? PNA which may have triggered her afib.  TSH is normal.  She has no history of bleeding but has a history of thrombocytopenia but Plt ct 405K.  Her CHASD2VASC score is 37 (age >89(2), female (1) and HTN (1)).  Will start Eliquis per pharmacy.   Continue Cardizem gtt but cannot titrate further due to soft BP so will start Amio gtt with 150mg  IV bolus.  Check 2D echo tomorrow once HR slows.  CP likely related to afib with RVR but will check trop.  If negative  trop and echo normal then consider outpt nuclear stress test.          Expand All Collapse All       [] Hide copied text  [] Hover for details    Cardiology Consultation:   Patient ID: Dominique [name]; [id number]; 1932-11-18   Admit date: 09/29/2017 Date of Consult: 09/29/2017  Primary Care Provider: [name], MD Primary Cardiologist: New- Dr Radford Pax   Patient Profile:   Dominique [name] is a 83 y.o. female with a hx of PAF in setting acute illness in 2012, HTN, dyslipidemia, hypothyroidism, distant uterine cancer, thrombocytopenia, osteoarthritis s/p RTKA 2011, hx of shingles, hx of hyponatremia and hypokaleima who is being seen today for the evaluation of new onset atrial fibrillation at the request of Dr. Vanita Panda.  History of Present Illness:   Dominique [name] presented to the [facility] ED this morning with complaints of lightheadedness and palpitations that began about 1 hour prior to presentation. She denies prior cardiac history or afib, however, upon chart review she was seen by Dr. Haroldine Laws in 02/2011 for episode of atrial fibrillation in the setting of colonic abscesses with constipation and diarrhea and hypokalemia with K+ 2.2. She was treated with cardizem and converted to SR. She was discharged on BB. She was not on a blood thinner.   Upon my exam she notes that she was doing fine this morning. After she ate breakfast and took her morning meds, at  about 9:00, she suddenly felt shaky and her heart was pounding. She had mild chest pressure, weakness and lightheadedness. She denies shortness of breath. She went to lay down but the symptoms did not improve. This is the first occurrence of these symptoms. Prior to this she was living alone and caring for herself. She was doing housework and occasionally moving furniture with no exertional chest discomfort or dyspnea. She denies orthopnea, PND or edema. She is feeling better now with her heart rate now in the 130's  down from the 160's.   2 attempts at electrical cardioversion were unsuccessful. Cardizem drip has been initiated as well as Eliquis.   She is noted to have leukocytosis with WBCs 17.4. Potassium is normal. Sodium and chloride are mildly decreased. Kidney function is normal with SCr 0.71. TSH is normal at 2.640. First troponin is negative at 0.03. Hgb is normal at 13.0. CXR shows small area of right lower lobe atelectasis/ infiltrate and small right effusion. Negative for heart failure.      Past Medical History:  Diagnosis Date  . A-fib ([place])   . Anemia   . Cancer ([place])    uterine  . Hypertension   . Shingles   . Thrombocytopenia ([place])   . Thyroid disease          Past Surgical History:  Procedure Laterality Date  . ABDOMINAL HYSTERECTOMY    . JOINT REPLACEMENT Bilateral    knees     Home Medications:         Prior to Admission medications   Medication Sig Start Date End Date Taking? Authorizing Provider  aspirin EC 81 MG tablet Take 81 mg by mouth daily.   Yes [provider]  fexofenadine (ALLEGRA) 180 MG tablet Take 180 mg by mouth daily.   Yes [provider]  levothyroxine (SYNTHROID, LEVOTHROID) 50 MCG tablet Take 50 mcg by mouth daily before breakfast.   Yes [provider]  metoprolol tartrate (LOPRESSOR) 50 MG tablet Take 50 mg by mouth daily. 09/28/17  Yes [provider]  simvastatin (ZOCOR) 20 MG tablet Take 20 mg by mouth daily.   Yes [provider]  hydrochlorothiazide (HYDRODIURIL) 25 MG tablet Take 25 mg by mouth daily.    [provider]  HYDROcodone-acetaminophen (NORCO/VICODIN) 5-325 MG per tablet Take 1 tablet by mouth every 4 (four) hours as needed. Patient not taking: Reported on 09/29/2017 05/20/15   Larene Pickett, PA-C  ibuprofen (ADVIL,MOTRIN) 200 MG tablet Take 200 mg by mouth every 6 (six) hours as needed for mild pain.    [provider]     Inpatient Medications: Scheduled Meds: . amiodarone  150 mg Intravenous Once   Continuous Infusions: . sodium chloride 125 mL/hr at 09/29/17 1037  . amiodarone     Followed by  . amiodarone    . diltiazem (CARDIZEM) infusion 12.5 mg/hr (09/29/17 1201)   PRN Meds:   Allergies:    Allergies  Allergen Reactions  . Codeine   . Darvocet [Propoxyphene N-Acetaminophen]   . Demerol   . Percocet [Oxycodone-Acetaminophen]   . Prednisone     Uncertain this is true. Was documented when patient was admitted 02/12/2012, but not recalled by either the patient nor her son. Tolerating it easily 08/2016.     Social History:   Social History        Socioeconomic History  . Marital status: Widowed    Spouse name: 08/11/2014  . Number of children: Not on file  .  Years of education: Not on file  . Highest education level: Not on file  Social Needs  . Financial resource strain: Not on file  . Food insecurity - worry: Not on file  . Food insecurity - inability: Not on file  . Transportation needs - medical: Not on file  . Transportation needs - non-medical: Not on file  Occupational History  . Occupation: retired [employer] manager  Tobacco Use  . Smoking status: Never Smoker  . Smokeless tobacco: Never Used  Substance and Sexual Activity  . Alcohol use: No  . Drug use: No  . Sexual activity: No  Other Topics Concern  . Not on file  Social History Narrative   Lives alone    Family History:         Family History  Problem Relation Age of Onset  . Tuberculosis Father   . Alcoholism Brother   . Hypertension Son      ROS:  Please see the history of present illness.  ROS  All other ROS reviewed and negative.     Physical Exam/Data:         Vitals:   09/29/17 1100 09/29/17 1101 09/29/17 1102 09/29/17 1106  BP: 117/81     Pulse:      Resp: 20 19 18  (!) 22  Temp:      TempSrc:      SpO2: 98% 96% 98% 95%  Weight:       Height:       No intake or output data in the 24 hours ending 09/29/17 1229    Filed Weights   09/29/17 0943  Weight: 135 lb (61.2 kg)   Body mass index is 27.27 kg/m.  General:  Well nourished, well developed, elderly female in no acute distress HEENT: normal Lymph: no adenopathy Neck: no JVD Endocrine:  No thryomegaly Vascular: No carotid bruits; FA pulses 2+ bilaterally without bruits  Cardiac:  normal S1, S2; irregularly irregular rhythm; systolic murmur in left chest Lungs:  clear to auscultation bilaterally, no wheezing, few crackles in right base Abd: soft, nontender, no hepatomegaly  Ext: no edema Musculoskeletal:  No deformities, BUE and BLE strength normal and equal Skin: warm and dry  Neuro:  CNs 2-12 intact, no focal abnormalities noted Psych:  Normal affect   EKG:  The EKG was personally reviewed and demonstrates:  Atrial fibrillation with RVR and probable rate related repolarization abnormality Telemetry:  Telemetry was personally reviewed and demonstrates:  afib in the 130's-140's  Relevant CV Studies:  Echo ordered  Laboratory Data:  Chemistry LastLabs     Recent Labs  Lab 09/29/17 0948  NA 130*  K 3.8  CL 97*  CO2 23  GLUCOSE 202*  BUN 10  CREATININE 0.71  CALCIUM 9.5  GFRNONAA >60  GFRAA >60  ANIONGAP 10      LastLabs  No results for input(s): PROT, ALBUMIN, AST, ALT, ALKPHOS, BILITOT in the last 168 hours.   Hematology LastLabs     Recent Labs  Lab 09/29/17 0948  WBC 17.4*  RBC 3.79*  HGB 13.0  HCT 37.5  MCV 98.9  MCH 34.3*  MCHC 34.7  RDW 14.8  PLT 405*     Cardiac Enzymes LastLabs  No results for input(s): TROPONINI in the last 168 hours.    LastLabs     Recent Labs  Lab 09/29/17 1003  TROPIPOC 0.03      BNP LastLabs  No results for input(s): BNP, PROBNP in the last 168 hours.  DDimer  Elie Confer  No results for input(s): DDIMER in the last 168 hours.    Radiology/Studies:   Dg Chest Port 1 View  Result Date: 09/29/2017 CLINICAL DATA:  New onset atrial fibrillation with chest pain EXAM: PORTABLE CHEST 1 VIEW COMPARISON:  02/12/2011 FINDINGS: Heart size within normal limits. Negative for heart failure. Elevated right hemidiaphragm as noted previously. Mild right lower lobe airspace disease is new since the prior study. Small right pleural effusion also new. Negative for edema. Advanced degenerative change right shoulder with rotator cuff disease. IMPRESSION: Elevated right hemidiaphragm unchanged. Small area of right lower lobe atelectasis/ infiltrate and small right effusion. Negative for heart failure. Electronically Signed   By: Franchot Gallo M.D.   On: 09/29/2017 10:31    Assessment and Plan:   1. New onset atrial fibrillation with RVR -Pt did have a brief episode of afib with RVR in the setting of acute illness in 2012, apparently none since.  -Symptoms began less than 1 hour prior to arrival at the ED, sudden onset after having breakfast. -Labs are unremarkable except for hyponatremia which is chronic for her. TSH normal. -She has leukocytosis and CXR shows small area of right lower lobe atelectasis/ infiltrate and small right effusion. She has no symptoms of infection.  -The atrial fib may be a response to an infectious process, but not clear yet. -2 attempts at electrical cardioversion unsuccessful. Now on cardizem with rates still in the 130's-140's. BP is stable. Will start amiodarone.  -CHA2DS2/VAS Stroke Risk Score is at least 4 (HTN, Age (2), female). Advise proceeding with DOAC as planned by ED MD. Eliquis 5 mg bid. -Will check echo tomorrow after better rate/rhythm control. I did note a murmur, possible mitral, on exam.   2. Hypertension -BP currently stable. Monitor with new med changes.       For questions or updates, please contact [place] Please consult [email or web address] for contact info under Cardiology/STEMI.   Signed, Dominique Perch, NP  09/29/2017 12:29 PM

## 2017-09-30 NOTE — Progress Notes (Signed)
  Echocardiogram 2D Echocardiogram has been performed.  Dominique [name] M 09/30/2017, 11:54 AM

## 2017-09-30 NOTE — Progress Notes (Signed)
Pt potentially to DC home on Eliquis. 30 day free trial card left on chart for pt and RN made aware. Marney Doctor RN,BSN,NCM [phone]

## 2017-09-30 NOTE — Progress Notes (Signed)
Progress Note  Patient Name: Dominique [name] Date of Encounter: 09/30/2017  Primary Cardiologist: Dr. Radford Pax  Subjective   Denies any further CP or SOB.  No palpitations.  Converted to NSR.  Trop neg at 0.03.  Inpatient Medications    Scheduled Meds: . apixaban  5 mg Oral BID   Continuous Infusions: . sodium chloride 125 mL/hr at 09/30/17 0535  . amiodarone 30 mg/hr (09/29/17 2343)  . diltiazem (CARDIZEM) infusion Stopped (09/29/17 1737)   PRN Meds:    Vital Signs    Vitals:   09/29/17 1940 09/29/17 2000 09/29/17 2052 09/30/17 0533  BP: (!) 149/63 (!) 150/62 (!) 143/84 (!) 153/63  Pulse: 65  67 71  Resp: 18 14 16 16   Temp: 98.3 F (36.8 C)  97.8 F (36.6 C) 98.2 F (36.8 C)  TempSrc: Oral  Oral Oral  SpO2: 96% 94% 99% 100%  Weight:      Height:        Intake/Output Summary (Last 24 hours) at 09/30/2017 1040 Last data filed at 09/30/2017 0600 Gross per 24 hour  Intake 3112.02 ml  Output 1100 ml  Net 2012.02 ml   Filed Weights   09/29/17 0943  Weight: 135 lb (61.2 kg)    Telemetry    NSR - Personally Reviewed  ECG    No new EKG to review - Personally Reviewed  Physical Exam   GEN: No acute distress.   Neck: No JVD Cardiac: RRR, no murmurs, rubs, or gallops.  Respiratory: Clear to auscultation bilaterally. GI: Soft, nontender, non-distended  MS: No edema; No deformity. Neuro:  Nonfocal  Psych: Normal affect   Labs    Chemistry Recent Labs  Lab 09/29/17 0948  NA 130*  K 3.8  CL 97*  CO2 23  GLUCOSE 202*  BUN 10  CREATININE 0.71  CALCIUM 9.5  GFRNONAA >60  GFRAA >60  ANIONGAP 10     Hematology Recent Labs  Lab 09/29/17 0948  WBC 17.4*  RBC 3.79*  HGB 13.0  HCT 37.5  MCV 98.9  MCH 34.3*  MCHC 34.7  RDW 14.8  PLT 405*    Cardiac EnzymesNo results for input(s): TROPONINI in the last 168 hours.  Recent Labs  Lab 09/29/17 1003  TROPIPOC 0.03     BNPNo results for input(s): BNP, PROBNP in the last 168 hours.    DDimer No results for input(s): DDIMER in the last 168 hours.   Radiology    Dg Chest Port 1 View  Result Date: 09/29/2017 CLINICAL DATA:  New onset atrial fibrillation with chest pain EXAM: PORTABLE CHEST 1 VIEW COMPARISON:  02/12/2011 FINDINGS: Heart size within normal limits. Negative for heart failure. Elevated right hemidiaphragm as noted previously. Mild right lower lobe airspace disease is new since the prior study. Small right pleural effusion also new. Negative for edema. Advanced degenerative change right shoulder with rotator cuff disease. IMPRESSION: Elevated right hemidiaphragm unchanged. Small area of right lower lobe atelectasis/ infiltrate and small right effusion. Negative for heart failure. Electronically Signed   By: Franchot Gallo M.D.   On: 09/29/2017 10:31    Cardiac Studies   none  Patient Profile     83 y.o. female with a hx of PAF in setting acute illness in 2012, HTN, dyslipidemia, hypothyroidism, distant uterine cancer, thrombocytopenia, osteoarthritis s/p RTKA 2011, hx of shingles, hx of hyponatremia and hypokaleima who is being seen  for the evaluation of new onset atrial fibrillation   Assessment & Plan  1. New onset atrial fibrillation with RVR -Pt did have a brief episode of afib with RVR in the setting of acute illness in 2012, apparently none since.  -Symptoms began less than 1 hour prior to arrival at the ED, sudden onset after having breakfast. -TSH normal. -She has leukocytosis and CXR shows small area of right lower lobe atelectasis/ infiltrate and small right effusion. She has no symptoms of infection.  -The atrial fib may be a response to an infectious process, but not clear yet. -2 attempts at electrical cardioversion unsuccessful in ER.  -CHA2DS2/VAS Stroke Risk Score is at least 4 (HTN, Age (2), female). Started on Eliquis 5 mg bid. -2D echo pending today -stop Cardizem gtt -restart metoprolol -continue Eliquis -if echo normal then  stable for discharge home on metoprolol and Eliquis and will have her followup in afib clinic.   2. Hypertension -BP borderline controlled.  - restart metoprolol - she was only on metoprolol tartrate 50mg  daily at home so will change to BID.      For questions or updates, please contact [place] Please consult [email or web address] for contact info under Cardiology/STEMI.      Signed, Fransico Him, MD  09/30/2017, 10:40 AM

## 2017-09-30 NOTE — Care Management Obs Status (Signed)
[facility] NOTIFICATION   Patient Details  Name: Dominique [name] MRN: [id number] Date of Birth: 02/02/1933   Medicare Observation Status Notification Given:  Yes    Lynnell Catalan, RN 09/30/2017, 12:52 PM

## 2017-10-15 ENCOUNTER — Encounter (HOSPITAL_COMMUNITY): Payer: Self-pay | Admitting: Nurse Practitioner

## 2017-10-15 ENCOUNTER — Telehealth (HOSPITAL_COMMUNITY): Payer: Self-pay | Admitting: *Deleted

## 2017-10-15 ENCOUNTER — Ambulatory Visit (HOSPITAL_COMMUNITY)
Admission: RE | Admit: 2017-10-15 | Discharge: 2017-10-15 | Disposition: A | Discharge status: Home / Self Care | Payer: Medicare Other | Source: Ambulatory Visit | Attending: Nurse Practitioner | Admitting: Nurse Practitioner

## 2017-10-15 VITALS — BP 138/72 | HR 52 | Ht 59.0 in | Wt 121.6 lb

## 2017-10-15 DIAGNOSIS — Z90710 Acquired absence of both cervix and uterus: Secondary | ICD-10-CM | POA: Insufficient documentation

## 2017-10-15 DIAGNOSIS — Z9889 Other specified postprocedural states: Secondary | ICD-10-CM | POA: Diagnosis not present

## 2017-10-15 DIAGNOSIS — I480 Paroxysmal atrial fibrillation: Secondary | ICD-10-CM | POA: Insufficient documentation

## 2017-10-15 DIAGNOSIS — I10 Essential (primary) hypertension: Secondary | ICD-10-CM | POA: Diagnosis not present

## 2017-10-15 DIAGNOSIS — Z79899 Other long term (current) drug therapy: Secondary | ICD-10-CM | POA: Diagnosis not present

## 2017-10-15 DIAGNOSIS — E079 Disorder of thyroid, unspecified: Secondary | ICD-10-CM | POA: Insufficient documentation

## 2017-10-15 NOTE — Progress Notes (Addendum)
Primary Care Physician: Dominique Solian, MD Referring Physician: [facility] f/u/Dr. [name] is a 84 y.o. female with a h/o h/o of paroxysmal afib in setting acute illness in 2012, HTN, dyslipidemia, hypothyroidism, distant uterine cancer, thrombocytopenia, osteoarthritis s/p RTKA 2011, hx of shingles, hx of hyponatremia and hypokaliemia to [facility] 09/29/17 with lightheadedness and palpitations that began an hour prior to admit.  Her symptoms began suddenly after taking her meds.  She felt shaky and and felt her heart pounding.  She did have mild chest pressure.  In ER 2 attempts of DCCV were attempted without success.  She was then placed on diltiazem drip and Eliquis. Her WBC was 17.4, other labs normal.  CHA2DS2/VAS is 4.  She was admitted for further eval. Pt did convert to SR. She felt much better in SR.  The dilt drip was stopped and metoprolol was resumed. She continued eliquis.   Echo stable and pt is stable for discharge   F/u in afib clinic 10/15/17 with her son- she is continuing in [place] and feels well. She is tolerating eliquis.   Today, she denies symptoms of palpitations, chest pain, shortness of breath, orthopnea, PND, lower extremity edema, dizziness, presyncope, syncope, or neurologic sequela. The patient is tolerating medications without difficulties and is otherwise without complaint today.   Past Medical History:  Diagnosis Date  . A-fib ([place])   . Anemia   . Cancer ([place])    uterine  . Hypertension   . Shingles   . Thrombocytopenia ([place])   . Thyroid disease    Past Surgical History:  Procedure Laterality Date  . ABDOMINAL HYSTERECTOMY    . JOINT REPLACEMENT Bilateral    knees    Current Outpatient Medications  Medication Sig Dispense Refill  . apixaban (ELIQUIS) 5 MG TABS tablet Take 1 tablet (5 mg total) by mouth 2 (two) times daily. 60 tablet 6  . fexofenadine (ALLEGRA) 180 MG tablet Take 180 mg by mouth daily.    Marland Kitchen levothyroxine (SYNTHROID,  LEVOTHROID) 50 MCG tablet Take 50 mcg by mouth daily before breakfast.    . metoprolol tartrate (LOPRESSOR) 50 MG tablet Take 1 tablet (50 mg total) by mouth 2 (two) times daily. 60 tablet 6  . simvastatin (ZOCOR) 20 MG tablet Take 20 mg by mouth daily.     No current facility-administered medications for this encounter.     Allergies  Allergen Reactions  . Codeine   . Darvocet [Propoxyphene N-Acetaminophen]   . Demerol   . Percocet [Oxycodone-Acetaminophen]   . Prednisone     Uncertain this is true. Was documented when patient was admitted 02/12/2012, but not recalled by either the patient nor her son. Tolerating it easily 08/2016.     Social History   Socioeconomic History  . Marital status: Widowed    Spouse name: 08/11/2014  . Number of children: Not on file  . Years of education: Not on file  . Highest education level: Not on file  Social Needs  . Financial resource strain: Not on file  . Food insecurity - worry: Not on file  . Food insecurity - inability: Not on file  . Transportation needs - medical: Not on file  . Transportation needs - non-medical: Not on file  Occupational History  . Occupation: retired Theme park manager  Tobacco Use  . Smoking status: Never Smoker  . Smokeless tobacco: Never Used  Substance and Sexual Activity  . Alcohol use: No  . Drug use: No  .  Sexual activity: No  Other Topics Concern  . Not on file  Social History Narrative   Lives alone    Family History  Problem Relation Age of Onset  . Tuberculosis Father   . Alcoholism Brother   . Hypertension Son     ROS- All systems are reviewed and negative except as per the HPI above  Physical Exam: Vitals:   10/15/17 1138  BP: 138/72  Pulse: (!) 52  Weight: 121 lb 9.6 oz (55.2 kg)  Height: 4\' 11"  (1.499 m)   Wt Readings from Last 3 Encounters:  10/15/17 121 lb 9.6 oz (55.2 kg)  09/29/17 135 lb (61.2 kg)  09/02/16 135 lb (61.2 kg)    Labs: Lab Results  Component Value Date   NA  130 (L) 09/29/2017   K 3.8 09/29/2017   CL 97 (L) 09/29/2017   CO2 23 09/29/2017   GLUCOSE 202 (H) 09/29/2017   BUN 10 09/29/2017   CREATININE 0.71 09/29/2017   CALCIUM 9.5 09/29/2017   MG 1.8 09/29/2017   Lab Results  Component Value Date   INR 1.29 02/16/2011   No results found for: CHOL, HDL, LDLCALC, TRIG   GEN- The patient is well appearing, alert and oriented x 3 today.   Head- normocephalic, atraumatic Eyes-  Sclera clear, conjunctiva pink Ears- hearing intact Oropharynx- clear Neck- supple, no JVP Lymph- no cervical lymphadenopathy Lungs- Clear to ausculation bilaterally, normal work of breathing Heart- Regular rate and rhythm, no murmurs, rubs or gallops, PMI not laterally displaced GI- soft, NT, ND, + BS Extremities- no clubbing, cyanosis, or edema MS- no significant deformity or atrophy Skin- no rash or lesion Psych- euthymic mood, full affect Neuro- strength and sensation are intact  EKG- Sinus brady at 52 bpm, pr int 186 ms, qrs int 66 ms, qtc 377 ms    Assessment and Plan: 1. Paroxysmal afib  Doing well in sinus brady HR in 50's but is not symptomatic Continue metoprolol at 50 mg bid Triggers for afib discussed  2. Chadsvasc score of at least 4 Bleeding precautions reviewed  I did not notice when pt was here  weight discrepancy with weight  at time of admission which was 135 lb, 09/29/17, and her weight today at 121 lb, her today weight is less than 60 mg and by guidelines, she should be on 2.5 mg bid of eliquis with age over 80.  The pt was called at home and asked to reweigh herself but scales are broken and she is planning to buy new ones this weekend. We will call her on Monday and if weight is truly less than 60 kg, will reduce eliquis to 2.5 mg bid.  F/u with Dr. Radford [name] in 6-8 weeks  Addendum- 1/8/129- Pt re weighed at home at her weight is 124 lbs or 56.36 kg, by guidelines, she should be on 2.5 mg bid and she is being notified to cut current  dose in half to 2.5 mg eliquis bid and new rx will be sent in for 2.5 mg bid when she is finished with current Rx.  Dominique [name], [facility] [address] [place], [place] [address] [phone]

## 2017-10-15 NOTE — Telephone Encounter (Addendum)
Pt supposed to call in Monday with weight from over the weekend.

## 2017-10-18 NOTE — Telephone Encounter (Signed)
I cld pt to verify her weight since she was to get a scale and get weighed over the weekend.  Pt was not able to do this and does not know anyone around her that has a scale to weight her.  She will have her son call our clinic to set a time to bring her in just for a weight check so that Roderic Palau, NP can appropriately dose her blood thinner.   Pt aware of the importance of being on the right dose.

## 2017-10-19 ENCOUNTER — Other Ambulatory Visit (HOSPITAL_COMMUNITY): Payer: Self-pay | Admitting: *Deleted

## 2017-10-19 MED ORDER — APIXABAN 2.5 MG PO TABS: 2.5000 mg | ORAL_TABLET | Freq: Two times a day (BID) | ORAL | 6 refills | Status: DC

## 2017-10-19 NOTE — Addendum Note (Signed)
Encounter addended by: Sherran Needs, NP on: 10/19/2017 11:30 AM  Actions taken: Sign clinical note

## 2017-11-18 ENCOUNTER — Encounter (HOSPITAL_COMMUNITY): Payer: Self-pay | Admitting: Emergency Medicine

## 2017-11-18 ENCOUNTER — Telehealth (HOSPITAL_COMMUNITY): Payer: Self-pay | Admitting: *Deleted

## 2017-11-18 ENCOUNTER — Emergency Department (HOSPITAL_COMMUNITY)
Admission: EM | Admit: 2017-11-18 | Discharge: 2017-11-18 | Disposition: A | Discharge status: Home / Self Care | Payer: Medicare Other | Attending: Emergency Medicine | Admitting: Emergency Medicine

## 2017-11-18 ENCOUNTER — Other Ambulatory Visit: Payer: Self-pay

## 2017-11-18 DIAGNOSIS — Z7901 Long term (current) use of anticoagulants: Secondary | ICD-10-CM | POA: Diagnosis not present

## 2017-11-18 DIAGNOSIS — I4891 Unspecified atrial fibrillation: Secondary | ICD-10-CM | POA: Insufficient documentation

## 2017-11-18 DIAGNOSIS — E039 Hypothyroidism, unspecified: Secondary | ICD-10-CM | POA: Insufficient documentation

## 2017-11-18 DIAGNOSIS — I10 Essential (primary) hypertension: Secondary | ICD-10-CM | POA: Diagnosis not present

## 2017-11-18 DIAGNOSIS — Z79899 Other long term (current) drug therapy: Secondary | ICD-10-CM | POA: Insufficient documentation

## 2017-11-18 DIAGNOSIS — R002 Palpitations: Secondary | ICD-10-CM | POA: Diagnosis present

## 2017-11-18 LAB — CBC
HEMATOCRIT: 37.8 % (ref 36.0–46.0)
HEMOGLOBIN: 12.7 g/dL (ref 12.0–15.0)
MCH: 33.7 pg (ref 26.0–34.0)
MCHC: 33.6 g/dL (ref 30.0–36.0)
MCV: 100.3 fL — AB (ref 78.0–100.0)
Platelets: 393 10*3/uL (ref 150–400)
RBC: 3.77 MIL/uL — ABNORMAL LOW (ref 3.87–5.11)
RDW: 14.3 % (ref 11.5–15.5)
WBC: 8.3 10*3/uL (ref 4.0–10.5)

## 2017-11-18 LAB — BASIC METABOLIC PANEL
Anion gap: 12 (ref 5–15)
BUN: 6 mg/dL (ref 6–20)
CHLORIDE: 101 mmol/L (ref 101–111)
CO2: 20 mmol/L — ABNORMAL LOW (ref 22–32)
Calcium: 9 mg/dL (ref 8.9–10.3)
Creatinine, Ser: 0.7 mg/dL (ref 0.44–1.00)
GFR calc Af Amer: >60 mL/min (ref >60)
Glucose, Bld: 117 mg/dL — ABNORMAL HIGH (ref 65–99)
POTASSIUM: 4 mmol/L (ref 3.5–5.1)
SODIUM: 133 mmol/L — AB (ref 135–145)

## 2017-11-18 LAB — I-STAT TROPONIN, ED: Troponin i, poc: 0.03 ng/mL (ref 0.00–0.08)

## 2017-11-18 MED ORDER — METOPROLOL TARTRATE 25 MG PO TABS
50.0000 mg | ORAL_TABLET | Freq: Once | ORAL | Status: AC
  Administered 2017-11-18: 50 mg via ORAL
  Filled 2017-11-18: qty 2

## 2017-11-18 MED ORDER — METOPROLOL TARTRATE 5 MG/5ML IV SOLN
5.0000 mg | Freq: Once | INTRAVENOUS | Status: AC
  Administered 2017-11-18: 5 mg via INTRAVENOUS
  Filled 2017-11-18: qty 5

## 2017-11-18 MED ORDER — AMIODARONE HCL 200 MG PO TABS: 200.0000 mg | ORAL_TABLET | Freq: Two times a day (BID) | ORAL | 0 refills | Status: DC

## 2017-11-18 MED ORDER — AMIODARONE HCL 200 MG PO TABS
200.0000 mg | ORAL_TABLET | Freq: Once | ORAL | Status: AC
  Administered 2017-11-18: 200 mg via ORAL
  Filled 2017-11-18: qty 1

## 2017-11-18 NOTE — ED Provider Notes (Signed)
[facility] EMERGENCY DEPARTMENT Provider Note   CSN: [id number] Arrival date & time: 11/18/17  0749     History   Chief Complaint Chief Complaint  Patient presents with  . Atrial Fibrillation    HPI Dominique [name] is a 84 y.o. female.  84 year old female with past medical history including atrial fibrillation on Eliquis, hypertension, hypothyroidism, thrombocytopenia, distant history of uterine cancer who presents with palpitations.  The patient states that she was awakened in the middle of the night by heart racing/palpitations sensation.  She denies any associated chest pain or shortness of breath.  She states she is compliant with medications and denies any recent illness including no fevers, vomiting, diarrhea, or cough/cold symptoms.  When EMS arrived to her home, they noted she was in A. fib with RVR with a variable rate in the 130s.  They gave her 10 of IV metoprolol in route.   The history is provided by the patient.    Past Medical History:  Diagnosis Date  . A-fib ([place])   . Anemia   . Cancer ([place])    uterine  . Hypertension   . Shingles   . Thrombocytopenia ([place])   . Thyroid disease     Patient Active Problem List   Diagnosis Date Noted  . A-fib ([place]) 09/29/2017  . Atrial fibrillation with RVR ([place])   . Essential hypertension   . Chest pain   . History of uterine cancer 09/02/2016  . Benign essential HTN 09/02/2016  . Hypothyroidism 09/02/2016  . History of shingles 09/02/2016  . History of iron deficiency anemia 09/02/2016  . History of thrombocytopenia 09/02/2016  . History of atrial fibrillation 09/02/2016    Past Surgical History:  Procedure Laterality Date  . ABDOMINAL HYSTERECTOMY    . JOINT REPLACEMENT Bilateral    knees    OB History    No data available       Home Medications    Prior to Admission medications   Medication Sig Start Date End Date Taking? Authorizing Provider  apixaban (ELIQUIS) 2.5 MG TABS tablet  Take 1 tablet (2.5 mg total) by mouth 2 (two) times daily. 10/19/17   Sherran Needs, NP  fexofenadine (ALLEGRA) 180 MG tablet Take 180 mg by mouth daily.    [provider]  levothyroxine (SYNTHROID, LEVOTHROID) 50 MCG tablet Take 50 mcg by mouth daily before breakfast.    [provider]  metoprolol tartrate (LOPRESSOR) 50 MG tablet Take 1 tablet (50 mg total) by mouth 2 (two) times daily. 09/30/17   Isaiah Serge, NP  simvastatin (ZOCOR) 20 MG tablet Take 20 mg by mouth daily.    [provider]    Family History Family History  Problem Relation Age of Onset  . Tuberculosis Father   . Alcoholism Brother   . Hypertension Son     Social History Social History   Tobacco Use  . Smoking status: Never Smoker  . Smokeless tobacco: Never Used  Substance Use Topics  . Alcohol use: No  . Drug use: No     Allergies   Codeine; Darvocet [propoxyphene n-acetaminophen]; Demerol; Percocet [oxycodone-acetaminophen]; and Prednisone   Review of Systems Review of Systems All other systems reviewed and are negative except that which was mentioned in HPI   Physical Exam Updated Vital Signs BP 129/81   Pulse 67   Temp 98.1 F (36.7 C) (Oral)   Resp 20   Ht 4\' 11"  (1.499 m)   Wt 48.5  kg (107 lb)   SpO2 95%   BMI 21.61 kg/m   Physical Exam  Constitutional: She is oriented to person, place, and time. She appears well-developed and well-nourished. No distress.  HENT:  Head: Normocephalic and atraumatic.  Moist mucous membranes  Eyes: Conjunctivae are normal.  Neck: Neck supple.  Cardiovascular: Normal heart sounds. An irregularly irregular rhythm present. Tachycardia present.  No murmur heard. Pulmonary/Chest: Effort normal and breath sounds normal.  Abdominal: Soft. Bowel sounds are normal. She exhibits no distension. There is no tenderness.  Musculoskeletal: She exhibits no edema.  Neurological: She is alert and oriented to person, place, and time.    Fluent speech  Skin: Skin is warm and dry.  Psychiatric: She has a normal mood and affect. Judgment normal.  Nursing note and vitals reviewed.    ED Treatments / Results  Labs (all labs ordered are listed, but only abnormal results are displayed) Labs Reviewed  BASIC METABOLIC PANEL - Abnormal; Notable for the following components:      Result Value   Sodium 133 (*)    CO2 20 (*)    Glucose, Bld 117 (*)    All other components within normal limits  CBC - Abnormal; Notable for the following components:   RBC 3.77 (*)    MCV 100.3 (*)    All other components within normal limits  I-STAT TROPONIN, ED    EKG  EKG Interpretation  Date/Time:  Thursday November 18 2017 07:53:51 EST Ventricular Rate:  143 PR Interval:    QRS Duration: 78 QT Interval:  311 QTC Calculation: 480 R Axis:   64 Text Interpretation:  Atrial fibrillation with rapid V-rate Probable LVH with secondary repol abnrm A fib with RVR new since previous tracing of sinus rhythm Confirmed by Theotis Burrow [phone]) on 11/18/2017 8:02:48 AM Also confirmed by Theotis Burrow [phone]), editor Lynder Parents [phone])  on 11/18/2017 8:22:25 AM       Radiology No results found.  Procedures Procedures (including critical care time)  Medications Ordered in ED Medications  amiodarone (PACERONE) tablet 200 mg (not administered)  metoprolol tartrate (LOPRESSOR) tablet 50 mg (50 mg Oral Given 11/18/17 0829)  metoprolol tartrate (LOPRESSOR) injection 5 mg (5 mg Intravenous Given 11/18/17 0831)  metoprolol tartrate (LOPRESSOR) injection 5 mg (5 mg Intravenous Given 11/18/17 1043)     Initial Impression / Assessment and Plan / ED Course  I have reviewed the triage vital signs and the nursing notes.  Pertinent labs  that were available during my care of the patient were reviewed by me and considered in my medical decision making (see chart for details).     Pt w/ known A fib on eliquis and metop at home p/w A fib w/ RVR.   Well-appearing and comfortable on exam.  EKG confirms A. fib with RVR.  No chest pain or shortness of breath.  Gave morning dose of oral metoprolol as well as IV metoprolol for more immediate rate control.  Lab work reassuring including normal troponin and potassium.  The patient's heart rate improved from 130s to 90-110s.  She continues to have no chest pain or shortness of breath.  Discussed her presentation with cardiologist on-call, Dr. Johnsie Cancel, appreciate his assistance. He reviewed her chart and recommended maintaining current dose of metop, adding Amiodarone 200mg  BID w/ f/u in A fib clinic.  I have discussed this plan with the patient and provided first dose in the ED.  She voiced understanding.  Extensively reviewed return precautions including chest  pain, breathing problems, near syncope or syncope.  Patient discharged in satisfactory condition. Final Clinical Impressions(s) / ED Diagnoses   Final diagnoses:  None    ED Discharge Orders    None       Khylei Wilms, Wenda Overland, MD 11/18/17 1101

## 2017-11-18 NOTE — ED Notes (Signed)
[place] applied to pt.

## 2017-11-18 NOTE — ED Triage Notes (Signed)
Per [facility] patient coming from home complaining of chest fluttering since last night. Denies any pain/SOB. Patient in afib rvr on ems arrival with a rate of 190. Ems administered 10 mg metoprolol. Patient hr 100-120 now. Reports afib hx. Lung sounds clear. Patient on eliquis. Alert and oriented x4

## 2017-11-18 NOTE — Telephone Encounter (Signed)
Pt seen in ED.  Started on Amiodarone and referred back to afib clinic.  LMOM for pt to rtn call to sched

## 2017-11-22 ENCOUNTER — Ambulatory Visit (HOSPITAL_COMMUNITY)
Admission: RE | Admit: 2017-11-22 | Discharge: 2017-11-22 | Disposition: A | Discharge status: Home / Self Care | Payer: Medicare Other | Source: Ambulatory Visit | Attending: Nurse Practitioner | Admitting: Nurse Practitioner

## 2017-11-22 ENCOUNTER — Encounter (HOSPITAL_COMMUNITY): Payer: Self-pay | Admitting: Nurse Practitioner

## 2017-11-22 VITALS — BP 158/96 | HR 88 | Ht 59.0 in | Wt 119.0 lb

## 2017-11-22 DIAGNOSIS — Z885 Allergy status to narcotic agent status: Secondary | ICD-10-CM | POA: Insufficient documentation

## 2017-11-22 DIAGNOSIS — E785 Hyperlipidemia, unspecified: Secondary | ICD-10-CM | POA: Insufficient documentation

## 2017-11-22 DIAGNOSIS — Z888 Allergy status to other drugs, medicaments and biological substances status: Secondary | ICD-10-CM | POA: Diagnosis not present

## 2017-11-22 DIAGNOSIS — I480 Paroxysmal atrial fibrillation: Secondary | ICD-10-CM | POA: Diagnosis present

## 2017-11-22 DIAGNOSIS — Z8542 Personal history of malignant neoplasm of other parts of uterus: Secondary | ICD-10-CM | POA: Insufficient documentation

## 2017-11-22 DIAGNOSIS — I10 Essential (primary) hypertension: Secondary | ICD-10-CM | POA: Diagnosis not present

## 2017-11-22 NOTE — Patient Instructions (Signed)
Decrease Amiodarone March 10th to 200mg  once a day

## 2017-11-22 NOTE — Progress Notes (Signed)
Primary Care Physician: [name], MD Referring Physician: [facility] f/u/Dr. Isabell Jarvis Gorum is a 84 y.o. female with a h/o h/o of paroxysmal afib in setting acute illness in 2012, HTN, dyslipidemia, hypothyroidism, distant uterine cancer, thrombocytopenia, osteoarthritis s/p RTKA 2011, hx of shingles, hx of hyponatremia and hypokaliemia to [facility] 09/29/17 with lightheadedness and palpitations that began an hour prior to admit.  Her symptoms began suddenly after taking her meds.  She felt shaky and and felt her heart pounding.  She did have mild chest pressure.  In ER 2 attempts of DCCV were attempted without success.  She was then placed on diltiazem drip and Eliquis. Her WBC was 17.4, other labs normal.  CHA2DS2/VAS is 4.  She was admitted for further eval. Pt did convert to SR. She felt much better in SR.  The dilt drip was stopped and metoprolol was resumed. She continued eliquis.   Echo stable and pt is stable for discharge   F/u in afib clinic 10/15/17 with her son- she is continuing in [place] and feels well. She is tolerating eliquis.  F/u in Afib clinic, 11/22/17, from an ER visit, 11/18/17, at which time she presented  with RVR of 130 bpm that started in the middle of the night. Dr. Johnsie Cancel was consulted and pt was started on amiodarone 200 mg bid, which she started 2/7. She is in the clinic with EKG showing SR at 88 bpm. She has bruising around her mouth but the dentist recently  had difficulty with the extraction of a tooth so her mouth is bruised. She is tolerating the amiodarone without shortness of breath or GI issues.  Today, she denies symptoms of palpitations, chest pain, shortness of breath, orthopnea, PND, lower extremity edema, dizziness, presyncope, syncope, or neurologic sequela. The patient is tolerating medications without difficulties and is otherwise without complaint today.   Past Medical History:  Diagnosis Date  . A-fib ([place])   . Anemia   . Cancer ([place])    uterine   . Hypertension   . Shingles   . Thrombocytopenia ([place])   . Thyroid disease    Past Surgical History:  Procedure Laterality Date  . ABDOMINAL HYSTERECTOMY    . JOINT REPLACEMENT Bilateral    knees    Current Outpatient Medications  Medication Sig Dispense Refill  . amiodarone (PACERONE) 200 MG tablet Take 1 tablet (200 mg total) by mouth 2 (two) times daily. 60 tablet 0  . apixaban (ELIQUIS) 2.5 MG TABS tablet Take 1 tablet (2.5 mg total) by mouth 2 (two) times daily. 60 tablet 6  . fexofenadine (ALLEGRA) 180 MG tablet Take 180 mg by mouth daily.    Marland Kitchen levothyroxine (SYNTHROID, LEVOTHROID) 50 MCG tablet Take 50 mcg by mouth daily before breakfast.    . metoprolol tartrate (LOPRESSOR) 50 MG tablet Take 1 tablet (50 mg total) by mouth 2 (two) times daily. 60 tablet 6  . simvastatin (ZOCOR) 20 MG tablet Take 20 mg by mouth daily.     No current facility-administered medications for this encounter.     Allergies  Allergen Reactions  . Codeine   . Darvocet [Propoxyphene N-Acetaminophen]   . Demerol   . Percocet [Oxycodone-Acetaminophen]   . Prednisone     Uncertain this is true. Was documented when patient was admitted 02/12/2012, but not recalled by either the patient nor her son. Tolerating it easily 08/2016.     Social History   Socioeconomic History  . Marital status: Widowed  Spouse name: 08/11/2014  . Number of children: Not on file  . Years of education: Not on file  . Highest education level: Not on file  Social Needs  . Financial resource strain: Not on file  . Food insecurity - worry: Not on file  . Food insecurity - inability: Not on file  . Transportation needs - medical: Not on file  . Transportation needs - non-medical: Not on file  Occupational History  . Occupation: retired Theme park manager  Tobacco Use  . Smoking status: Never Smoker  . Smokeless tobacco: Never Used  Substance and Sexual Activity  . Alcohol use: No  . Drug use: No  . Sexual activity: No   Other Topics Concern  . Not on file  Social History Narrative   Lives alone    Family History  Problem Relation Age of Onset  . Tuberculosis Father   . Alcoholism Brother   . Hypertension Son     ROS- All systems are reviewed and negative except as per the HPI above  Physical Exam: Vitals:   11/22/17 1141  BP: (!) 158/96  Pulse: 88  Weight: 119 lb (54 kg)  Height: 4\' 11"  (1.499 m)   Wt Readings from Last 3 Encounters:  11/22/17 119 lb (54 kg)  11/18/17 107 lb (48.5 kg)  10/15/17 121 lb 9.6 oz (55.2 kg)    Labs: Lab Results  Component Value Date   NA 133 (L) 11/18/2017   K 4.0 11/18/2017   CL 101 11/18/2017   CO2 20 (L) 11/18/2017   GLUCOSE 117 (H) 11/18/2017   BUN 6 11/18/2017   CREATININE 0.70 11/18/2017   CALCIUM 9.0 11/18/2017   MG 1.8 09/29/2017   Lab Results  Component Value Date   INR 1.29 02/16/2011   No results found for: CHOL, HDL, LDLCALC, TRIG   GEN- The patient is well appearing, alert and oriented x 3 today.   Head- normocephalic, atraumatic Eyes-  Sclera clear, conjunctiva pink Ears- hearing intact Oropharynx- clear Neck- supple, no JVP Lymph- no cervical lymphadenopathy Lungs- Clear to ausculation bilaterally, normal work of breathing Heart- Regular rate and rhythm, no murmurs, rubs or gallops, PMI not laterally displaced GI- soft, NT, ND, + BS Extremities- no clubbing, cyanosis, or edema MS- no significant deformity or atrophy Skin- no rash or lesion Psych- euthymic mood, full affect Neuro- strength and sensation are intact  EKG- Normal Sinus rhythm  at 88 bpm, pr int 176 ms, qrs int 76 ms, qtc 457 ms    Assessment and Plan: 1. Paroxysmal afib  Doing well on amiodarone 200 mg bid Staying in SR Continue at this dose until 3/10 and then reduce to 200 mg da day Continue metoprolol at 50 mg bid  2. Chadsvasc score of at least 4 Conitnue eliquis to 2.5 mg bid.  F/u with Eugenia Mcalpine, PA as scheduled 2/18 afib clinic as  needed   Geroge Baseman. [place], [facility] [address] [place], [place] [address] [phone]

## 2017-11-26 ENCOUNTER — Encounter: Payer: Self-pay | Admitting: Physician Assistant

## 2017-11-26 DIAGNOSIS — I480 Paroxysmal atrial fibrillation: Secondary | ICD-10-CM | POA: Insufficient documentation

## 2017-11-26 NOTE — Progress Notes (Signed)
Cardiology Office Note    Date:  11/29/2017  ID:  Dominique [name], DOB [birth year], MRN [id number] PCP:  [name], MD  Cardiologist:  Dr. Radford Pax   Chief Complaint: f/u atrial fib  History of Present Illness:  Dominique [name] is a 84 y.o. female with history of PAF (initial dx in 2012 during acute illness, began to recur 09/2017), HTN, dyslipidemia, hypothyroidism, distant uterine cancer, thrombocytopenia, osteoarthritis s/p RTKA 2011, shingles, hyponatremia, hypokalemia who presents for post-hospital follow-up.   She was recently admitted 09/2017 with symptomatic recurrent atrial fib. She underwent DCCVx2 in the ED without success so was admitted and placed on diltiazem drip with subsequent conversion to NSR. Labs were notable for leukocytosis of 17, hypontremia of 130 (chronic), hyperglycemia to 202, normal TSH. 2D echo 09/29/17 showed EF 55-60%, grade 2 DD, high ventricular filing pressure, mild TR, severe focal basal septal hypertrophy, normal PASP. She was seen back in the ED 2/7 with recurrent AF RVR. She was given 10mg  IV metoprolol en route. Labs at that time showed K 4, Na 133, glu 117, Cr 0.70, Hgb 12.7, normal WBC, negative troponin. The case was discussed with cardiology and she was started on amiodarone 200mg  BID. She converted during that hospitalization to SR and felt much better. Echo was stable and she was discharged. She followed up in the Afib clinic on 10/15/17 and 10/22/17 and was in NSR during those visits.   She presents for follow up today. She has been feeling well. She denies palpitations, shortness of breath, chest pain, orthopnea, lower extremity swelling, dizziness, and feelings of syncope. She denies bleeding problems, although she had some upper lip bruising associated with a recent dental procedure. This bruising is not out of proportion to the situation.  In discussion with the patient, there is some confusion about how to take eliquis. We reviewed the 2.5 mg BID  dosing schedule that she should be on moving forward (she may have been taking 5 mg daily).  EKG today with sinus bradycardia.   Past Medical History:  Diagnosis Date  . Anemia   . Cancer ([place])    uterine  . Hypertension   . Hyponatremia   . Hypothyroidism   . LVH (left ventricular hypertrophy)   . PAF (paroxysmal atrial fibrillation) ([place])    a. initial dx 2012 in setting of acute illness. b. began to recur 09/2017; started on amiodarone 11/2017.  Marland Kitchen Shingles   . Thrombocytopenia ([place])     Past Surgical History:  Procedure Laterality Date  . ABDOMINAL HYSTERECTOMY    . JOINT REPLACEMENT Bilateral    knees    Current Medications: Current Meds  Medication Sig  . amiodarone (PACERONE) 200 MG tablet Take 1 tablet (200 mg total) by mouth 2 (two) times daily.  Marland Kitchen apixaban (ELIQUIS) 2.5 MG TABS tablet Take 1 tablet (2.5 mg total) by mouth 2 (two) times daily.  . fexofenadine (ALLEGRA) 180 MG tablet Take 180 mg by mouth daily.  Marland Kitchen levothyroxine (SYNTHROID, LEVOTHROID) 50 MCG tablet Take 50 mcg by mouth daily before breakfast.  . metoprolol tartrate (LOPRESSOR) 50 MG tablet Take 1 tablet (50 mg total) by mouth 2 (two) times daily.  . simvastatin (ZOCOR) 20 MG tablet Take 20 mg by mouth daily.     Allergies:   Codeine; Darvocet [propoxyphene n-acetaminophen]; Demerol; Percocet [oxycodone-acetaminophen]; and Prednisone   Social History   Socioeconomic History  . Marital status: Widowed    Spouse name: 08/11/2014  . Number of children: None  .  Years of education: None  . Highest education level: None  Social Needs  . Financial resource strain: None  . Food insecurity - worry: None  . Food insecurity - inability: None  . Transportation needs - medical: None  . Transportation needs - non-medical: None  Occupational History  . Occupation: retired Theme [employer] manager  Tobacco Use  . Smoking status: Never Smoker  . Smokeless tobacco: Never Used  Substance and Sexual Activity  .  Alcohol use: No  . Drug use: No  . Sexual activity: No  Other Topics Concern  . None  Social History Narrative   Lives alone     Family History:  Family History  Problem Relation Age of Onset  . Tuberculosis Father   . Alcoholism Brother   . Hypertension Son      ROS:   Please see the history of present illness.  All other systems are reviewed and otherwise negative.    PHYSICAL EXAM:   VS:  BP 126/80   Pulse (!) 59   Ht 4\' 11"  (1.499 m)   Wt 121 lb (54.9 kg)   BMI 24.44 kg/m   BMI: Body mass index is 24.44 kg/m. GEN: Well nourished, well developed, in no acute distress  HEENT: normocephalic, atraumatic Neck: no JVD, carotid bruits, or masses Cardiac: regular rhythm, bradycardic rate; no murmurs, rubs, or gallops, no edema  Respiratory:  clear to auscultation bilaterally, normal work of breathing GI: soft, nontender, nondistended, + BS MS: no deformity or atrophy, tenderness to palpation on left extremity Skin: warm and dry, no rash. Ecchymosis on upper lip associated with recent dental procedure. Neuro:  Alert and Oriented x 3, Strength and sensation are intact, follows commands Psych: euthymic mood, full affect  Wt Readings from Last 3 Encounters:  11/29/17 121 lb (54.9 kg)  11/22/17 119 lb (54 kg)  11/18/17 107 lb (48.5 kg)      Studies/Labs Reviewed:   EKG:  EKG was ordered today and personally reviewed by me and demonstrates sinus bradycardia.  Recent Labs: 09/29/2017: Magnesium 1.8; TSH 2.640 11/18/2017: BUN 6; Creatinine, Ser 0.70; Hemoglobin 12.7; Platelets 393; Potassium 4.0; Sodium 133   Lipid Panel No results found for: CHOL, TRIG, HDL, CHOLHDL, VLDL, LDLCALC, LDLDIRECT  Additional studies/ records that were reviewed today include: Summarized above.  Echo 09/30/17: Study Conclusions - Left ventricle: The cavity size was normal. There was severe   focal basal hypertrophy of the septum. Systolic function was   normal. The estimated ejection  fraction was in the range of 55%   to 60%. Wall motion was normal; there were no regional wall   motion abnormalities. Features are consistent with a pseudonormal   left ventricular filling pattern, with concomitant abnormal   relaxation and increased filling pressure (grade 2 diastolic   dysfunction). Doppler parameters are consistent with high   ventricular filling pressure. - Aortic valve: Transvalvular velocity was within the normal range.   There was no stenosis. There was no regurgitation. - Mitral valve: Mildly calcified annulus. Transvalvular velocity   was within the normal range. There was no evidence for stenosis.   There was trivial regurgitation. - Left atrium: The atrium was moderately dilated. - Right ventricle: The cavity size was normal. Wall thickness was   normal. Systolic function was normal. - Tricuspid valve: There was mild regurgitation. - Pulmonary arteries: Systolic pressure was within the normal   range. PA peak pressure: 32 mm Hg (S).   ASSESSMENT & PLAN:    1. Paroxysmal  atrial fibrillation She continues on amiodarone, lopressor, and eliquis. EKG today with sinus bradycardia. She is not symptomatic with a heart rate in the 50s. I'm not inclined to make medication changes today. HR was in the 50s during prior AF clinic visit as well. Can consider reducing dose of lopressor if she becomes symptomatic with her bradycardia.  A weight discrepancy was discussed at Afib clinic visit. She was 121 lbs in clinic. Her weight at home was 124 lbs and her eliquis was reduced to 2.5 mg BID, as this was much lower than her weight recorded in the ER. Weight today is 121 lbs. The patient may have had some confusion about how to take eliquis - she may have been taking 5 mg daily. Instructed her to take 2.5 mg BID. Continue 2.5 mg eliquis BID. No problems with bleeding. She has some bruising on her upper lip from a recent dental procedure (this was not out of proportion to the  situation). This patients CHA2DS2-VASc Score and unadjusted Ischemic Stroke Rate (% per year) is equal to 4.8 % stroke rate/year from a score of 4 (HTN, female, age).  She was instructed to take amiodarone 200 mg BID until 12/19/17 and then reduce to 200 mg amiodarone daily. She will continue lopressor 50 mg BID.  Last TSH was checked 09/29/17 and was 2.640. Discussed checking TSH every 6 months with her PCP. She has not had recent LFTs, will check CMT today.  She will follow up in Afib clinic PRN. Follow up with Dr. Radford Pax in 3-4 months.   2.  Essential HTN with LVH Pressure is well-controlled. No medication changes.   3. Hypothyroidism TSH as above. PCP follows her synthroid; check TSH every 6 months with PCP.  4. Hyponatremia This seems to be a chronic problem for her. She is asymptomatic. Will check CMP today. Last Na was 133 on 11/18/17.   Disposition: F/u with Dr. Radford Pax in 3-4 weeks.   Medication Adjustments/Labs and Tests Ordered: Current medicines are reviewed at length with the patient today.  Concerns regarding medicines are outlined above. Medication changes, Labs and Tests ordered today are summarized above and listed in the Patient Instructions accessible in Encounters.   Signed, Dominique [name], [place]  11/29/2017 10:59 AM    [facility] [address], [place], [place]  [address] Phone: [phone]; Fax: [phone]

## 2017-11-29 ENCOUNTER — Ambulatory Visit: Payer: Medicare Other | Admitting: Physician Assistant

## 2017-11-29 ENCOUNTER — Encounter: Payer: Self-pay | Admitting: Physician Assistant

## 2017-11-29 VITALS — BP 126/80 | HR 59 | Ht 59.0 in | Wt 121.0 lb

## 2017-11-29 DIAGNOSIS — I10 Essential (primary) hypertension: Secondary | ICD-10-CM

## 2017-11-29 DIAGNOSIS — E871 Hypo-osmolality and hyponatremia: Secondary | ICD-10-CM | POA: Diagnosis not present

## 2017-11-29 DIAGNOSIS — Z79899 Other long term (current) drug therapy: Secondary | ICD-10-CM

## 2017-11-29 DIAGNOSIS — E039 Hypothyroidism, unspecified: Secondary | ICD-10-CM | POA: Diagnosis not present

## 2017-11-29 DIAGNOSIS — I517 Cardiomegaly: Secondary | ICD-10-CM | POA: Diagnosis not present

## 2017-11-29 DIAGNOSIS — I480 Paroxysmal atrial fibrillation: Secondary | ICD-10-CM

## 2017-11-29 LAB — COMPREHENSIVE METABOLIC PANEL
ALBUMIN: 3.9 g/dL (ref 3.5–4.7)
ALT: 19 IU/L (ref 0–32)
AST: 27 IU/L (ref 0–40)
Albumin/Globulin Ratio: 1.8 (ref 1.2–2.2)
Alkaline Phosphatase: 49 IU/L (ref 39–117)
BUN/Creatinine Ratio: 15 (ref 12–28)
BUN: 13 mg/dL (ref 8–27)
Bilirubin Total: 0.3 mg/dL (ref 0.0–1.2)
CALCIUM: 9.6 mg/dL (ref 8.7–10.3)
CO2: 23 mmol/L (ref 20–29)
CREATININE: 0.84 mg/dL (ref 0.57–1.00)
Chloride: 97 mmol/L (ref 96–106)
GFR calc Af Amer: 75 mL/min/{1.73_m2} (ref >59)
GFR, EST NON AFRICAN AMERICAN: 65 mL/min/{1.73_m2} (ref >59)
GLOBULIN, TOTAL: 2.2 g/dL (ref 1.5–4.5)
Glucose: 90 mg/dL (ref 65–99)
Potassium: 4.7 mmol/L (ref 3.5–5.2)
SODIUM: 135 mmol/L (ref 134–144)
Total Protein: 6.1 g/dL (ref 6.0–8.5)

## 2017-11-29 NOTE — Patient Instructions (Signed)
Medication Instructions:  Your physician recommends that you continue on your current medications as directed. Please refer to the Current Medication list given to you today.   Labwork: TODAY:  CMET  Testing/Procedures: None ordered  Follow-Up: Your physician recommends that you schedule a follow-up appointment in: 3-4 MONTHS WITH DR. Radford Pax   Any Other Special Instructions Will Be Listed Below (If Applicable).     If you need a refill on your cardiac medications before your next appointment, please call your pharmacy.

## 2017-11-30 NOTE — Progress Notes (Signed)
Pt has been made aware of normal result and verbalized understanding.  jw 11/30/17

## 2017-12-13 NOTE — Addendum Note (Signed)
Encounter addended by: Sherran Needs, NP on: 12/13/2017 5:00 PM  Actions taken: LOS modified

## 2017-12-25 ENCOUNTER — Other Ambulatory Visit: Payer: Self-pay | Admitting: Cardiology

## 2017-12-27 ENCOUNTER — Other Ambulatory Visit: Payer: Self-pay | Admitting: Cardiology

## 2017-12-27 MED ORDER — AMIODARONE HCL 200 MG PO TABS: 200.0000 mg | ORAL_TABLET | Freq: Two times a day (BID) | ORAL | 3 refills | Status: DC

## 2017-12-27 NOTE — Addendum Note (Signed)
Addended by: Derl Barrow on: 12/27/2017 03:17 PM   Modules accepted: Orders

## 2017-12-27 NOTE — Telephone Encounter (Signed)
Pt's medication was sent to pt's pharmacy as requested. Confirmation received.  °

## 2017-12-28 ENCOUNTER — Other Ambulatory Visit (HOSPITAL_COMMUNITY): Payer: Self-pay | Admitting: *Deleted

## 2017-12-28 MED ORDER — APIXABAN 2.5 MG PO TABS: 2.5000 mg | ORAL_TABLET | Freq: Two times a day (BID) | ORAL | 6 refills | Status: DC

## 2018-01-18 ENCOUNTER — Encounter: Payer: Self-pay | Admitting: Cardiology

## 2018-02-10 ENCOUNTER — Encounter (HOSPITAL_COMMUNITY): Payer: Self-pay | Admitting: Emergency Medicine

## 2018-02-10 ENCOUNTER — Emergency Department (HOSPITAL_COMMUNITY): Payer: Medicare Other

## 2018-02-10 ENCOUNTER — Other Ambulatory Visit: Payer: Self-pay

## 2018-02-10 ENCOUNTER — Emergency Department (HOSPITAL_COMMUNITY)
Admission: EM | Admit: 2018-02-10 | Discharge: 2018-02-10 | Disposition: A | Discharge status: Home / Self Care | Payer: Medicare Other | Attending: Emergency Medicine | Admitting: Emergency Medicine

## 2018-02-10 DIAGNOSIS — Z8542 Personal history of malignant neoplasm of other parts of uterus: Secondary | ICD-10-CM | POA: Insufficient documentation

## 2018-02-10 DIAGNOSIS — Z79899 Other long term (current) drug therapy: Secondary | ICD-10-CM | POA: Diagnosis not present

## 2018-02-10 DIAGNOSIS — M79652 Pain in left thigh: Secondary | ICD-10-CM | POA: Diagnosis not present

## 2018-02-10 DIAGNOSIS — R531 Weakness: Secondary | ICD-10-CM | POA: Insufficient documentation

## 2018-02-10 DIAGNOSIS — W0110XA Fall on same level from slipping, tripping and stumbling with subsequent striking against unspecified object, initial encounter: Secondary | ICD-10-CM | POA: Insufficient documentation

## 2018-02-10 DIAGNOSIS — Y9301 Activity, walking, marching and hiking: Secondary | ICD-10-CM | POA: Diagnosis not present

## 2018-02-10 DIAGNOSIS — I10 Essential (primary) hypertension: Secondary | ICD-10-CM | POA: Insufficient documentation

## 2018-02-10 DIAGNOSIS — M79651 Pain in right thigh: Secondary | ICD-10-CM | POA: Diagnosis not present

## 2018-02-10 DIAGNOSIS — E039 Hypothyroidism, unspecified: Secondary | ICD-10-CM | POA: Diagnosis not present

## 2018-02-10 DIAGNOSIS — M545 Low back pain, unspecified: Secondary | ICD-10-CM

## 2018-02-10 DIAGNOSIS — Y999 Unspecified external cause status: Secondary | ICD-10-CM | POA: Diagnosis not present

## 2018-02-10 DIAGNOSIS — Z7901 Long term (current) use of anticoagulants: Secondary | ICD-10-CM | POA: Diagnosis not present

## 2018-02-10 DIAGNOSIS — Y92009 Unspecified place in unspecified non-institutional (private) residence as the place of occurrence of the external cause: Secondary | ICD-10-CM | POA: Insufficient documentation

## 2018-02-10 DIAGNOSIS — W19XXXA Unspecified fall, initial encounter: Secondary | ICD-10-CM

## 2018-02-10 DIAGNOSIS — M791 Myalgia, unspecified site: Secondary | ICD-10-CM

## 2018-02-10 DIAGNOSIS — Z96653 Presence of artificial knee joint, bilateral: Secondary | ICD-10-CM | POA: Insufficient documentation

## 2018-02-10 NOTE — Discharge Instructions (Addendum)
Continue making at home medications as prescribed.  Take tylenol as needed for pain.  Follow up with your primary care doctor for further evaluation next week.  Return to the ER if you develop confusion, vision loss, slurred speech, numbness, loss of bowel or bladder control, or any new or concerning symptoms.

## 2018-02-10 NOTE — ED Provider Notes (Signed)
[facility] DEPT Provider Note   CSN: [id number] Arrival date & time: 02/10/18  0846     History   Chief Complaint Chief Complaint  Patient presents with  . Fall    HPI Dominique [name] is a 84 y.o. female presenting for evaluation after a fall.  Patient states that around 330 this morning, she was in the bathroom wearing socks when she slipped on the tiled floor.  She denies feeling dizzy, chest pain, short of breath prior to the fall.  She states she fell on her buttocks.  She denies hitting her head or loss of consciousness.  She denies feeling altered since.  She currently reports pain of her low back, buttock, and thighs.  She has not taken anything for pain.  She states she is not currently on her Eliquis (is supposed to be on it for a fib).  She states she has ambulated since the fall, and reports pain with change of position.  She denies upper extremity, upper back, neck, chest, or abdominal pain.  She denies vision changes, slurred speech, numbness, weakness, loss of bowel or bladder control.  HPI  Past Medical History:  Diagnosis Date  . Anemia   . Cancer ([place])    uterine  . Hypertension   . Hyponatremia   . Hypothyroidism   . LVH (left ventricular hypertrophy)   . PAF (paroxysmal atrial fibrillation) ([place])    a. initial dx 2012 in setting of acute illness. b. began to recur 09/2017; started on amiodarone 11/2017.  Marland Kitchen Shingles   . Thrombocytopenia [facility])     Patient Active Problem List   Diagnosis Date Noted  . PAF (paroxysmal atrial fibrillation) ([place]) 11/26/2017  . Atrial fibrillation with RVR ([place])   . Essential hypertension   . History of uterine cancer 09/02/2016  . Benign essential HTN 09/02/2016  . Hypothyroidism 09/02/2016  . History of shingles 09/02/2016  . History of iron deficiency anemia 09/02/2016  . History of thrombocytopenia 09/02/2016    Past Surgical History:  Procedure Laterality Date  . ABDOMINAL HYSTERECTOMY     . JOINT REPLACEMENT Bilateral    knees     OB History   None      Home Medications    Prior to Admission medications   Medication Sig Start Date End Date Taking? Authorizing Provider  amiodarone (PACERONE) 200 MG tablet Take 1 tablet (200 mg total) by mouth 2 (two) times daily. 12/27/17  Yes Turner, Eber Hong, MD  apixaban (ELIQUIS) 2.5 MG TABS tablet Take 1 tablet (2.5 mg total) by mouth 2 (two) times daily. 12/28/17  Yes Sherran Needs, NP  augmented betamethasone dipropionate (DIPROLENE-AF) 0.05 % ointment Apply 1 application topically 2 (two) times daily. 12/28/17  Yes [provider]  clotrimazole (MYCELEX) 10 MG troche 1 lozenge daily. 01/31/18  Yes [provider]  fluconazole (DIFLUCAN) 200 MG tablet Take 1 tablet by mouth once a week. 01/31/18  Yes [provider]  levothyroxine (SYNTHROID, LEVOTHROID) 50 MCG tablet Take 50 mcg by mouth daily before breakfast.   Yes [provider]  metoprolol tartrate (LOPRESSOR) 50 MG tablet Take 1 tablet (50 mg total) by mouth 2 (two) times daily. Patient taking differently: Take 50 mg by mouth daily.  09/30/17  Yes Isaiah Serge, NP  mupirocin ointment (BACTROBAN) 2 % Apply 1 application topically 2 (two) times daily. 12/25/17  Yes [provider]  simvastatin (ZOCOR) 20 MG tablet Take 20 mg by mouth daily.  Yes [provider]    Family History Family History  Problem Relation Age of Onset  . Tuberculosis Father   . Alcoholism Brother   . Hypertension Son     Social History Social History   Tobacco Use  . Smoking status: Never Smoker  . Smokeless tobacco: Never Used  Substance Use Topics  . Alcohol use: No  . Drug use: No     Allergies   Codeine; Darvocet [propoxyphene n-acetaminophen]; Demerol; Percocet [oxycodone-acetaminophen]; and Prednisone   Review of Systems Review of Systems  Eyes: Negative for visual disturbance.  Respiratory: Negative for cough and  shortness of breath.   Cardiovascular: Negative for chest pain.  Gastrointestinal: Negative for abdominal pain, nausea and vomiting.  Genitourinary:       No loss of bowel or bladder control  Musculoskeletal: Positive for arthralgias, back pain and myalgias.  Skin: Negative for wound.  Neurological: Negative for dizziness, numbness and headaches.  Hematological: Does not bruise/bleed easily.  Psychiatric/Behavioral: Negative for confusion.     Physical Exam Updated Vital Signs BP (!) 156/76   Pulse 60   Temp 98.4 F (36.9 C) (Oral)   Resp 18   SpO2 100%   Physical Exam  Constitutional: She is oriented to person, place, and time. She appears well-developed and well-nourished. No distress.  Sitting comfortably in bed in no apparent distress.  HENT:  Head: Normocephalic and atraumatic.  Right Ear: Tympanic membrane, external ear and ear canal normal. No hemotympanum.  Left Ear: Tympanic membrane, external ear and ear canal normal. No hemotympanum.  Nose: Nose normal. No nasal septal hematoma.  Mouth/Throat: Uvula is midline, oropharynx is clear and moist and mucous membranes are normal.  No obvious head injury.  No laceration, hematoma, or deformity noted.  Eyes: Pupils are equal, round, and reactive to light. Conjunctivae and EOM are normal.  Neck: Normal range of motion. Neck supple.  Cardiovascular: Normal rate, regular rhythm and intact distal pulses.  Pulmonary/Chest: Effort normal and breath sounds normal. No respiratory distress. She has no wheezes. She exhibits no tenderness.  No TTP of chest wall  Abdominal: Soft. She exhibits no distension and no mass. There is no tenderness. There is no guarding.  No TTP of abd. No rigidity, guarding, or distention  Musculoskeletal: Normal range of motion. She exhibits tenderness. She exhibits no edema or deformity.  Radial and pedal pulses equal bilaterally.  Soft compartments.  No obvious deformity.  Tenderness to palpation of the  pelvis, worse on the right side.  Tenderness palpation of left knee.  No tenderness to palpation of neck or upper back.  Mild tenderness to palpation of mid thoracic and lumbar back.  Patient able to sit up in bed without signs of pain or difficulty.  No saddle paresthesia.  Grip strength intact bilaterally.  Straight leg raise intact bilaterally.  Neurological: She is alert and oriented to person, place, and time. No sensory deficit.  Skin: Skin is warm and dry.  Psychiatric: She has a normal mood and affect.  Nursing note and vitals reviewed.    ED Treatments / Results  Labs (all labs ordered are listed, but only abnormal results are displayed) Labs Reviewed - No data to display  EKG None  Radiology Dg Thoracic Spine 2 View  Result Date: 02/10/2018 CLINICAL DATA:  Back pain after fall last night. EXAM: THORACIC SPINE 2 VIEWS COMPARISON:  Radiographs of Feb 12, 2011. FINDINGS: No fracture or spondylolisthesis is noted. Multilevel degenerative disc disease is noted in  the midthoracic spine. Mild dextroscoliosis of thoracic spine is noted. IMPRESSION: Multilevel degenerative disc disease. No acute abnormality seen in the thoracic spine. Electronically Signed   By: Marijo Conception, M.D.   On: 02/10/2018 10:17   Dg Lumbar Spine Complete  Result Date: 02/10/2018 CLINICAL DATA:  Low back pain after fall last night. EXAM: LUMBAR SPINE - COMPLETE 4+ VIEW COMPARISON:  CT scan of July 29, 2011. FINDINGS: Mild levoscoliosis of lumbar spine is noted. Diffuse osteopenia is noted. Mild grade 1 anterolisthesis of L4-5 is noted secondary to posterior facet joint hypertrophy. Severe degenerative disc disease is noted at L2-3. Atherosclerosis of abdominal aorta is noted. IMPRESSION: Degenerative changes as described above. No acute abnormality seen in the lumbar spine. Aortic Atherosclerosis (ICD10-I70.0). Electronically Signed   By: Marijo Conception, M.D.   On: 02/10/2018 10:15   Ct Head Wo  Contrast  Result Date: 02/10/2018 CLINICAL DATA:  84 year old female with unwitnessed fall at home. Ataxia. Weakness and numbness. EXAM: CT HEAD WITHOUT CONTRAST TECHNIQUE: Contiguous axial images were obtained from the base of the skull through the vertex without intravenous contrast. COMPARISON:  None. FINDINGS: Brain: Cerebral volume is within normal limits for age. Mild dystrophic bilateral basal ganglia calcifications. Largely normal for age gray-white matter differentiation throughout the brain; minimal white matter hypodensity. No midline shift, ventriculomegaly, mass effect, evidence of mass lesion, intracranial hemorrhage or evidence of cortically based acute infarction. No cortical encephalomalacia identified. Vascular: Calcified atherosclerosis at the skull base. No suspicious intracranial vascular hyperdensity. Skull: Osteopenia.  No acute osseous abnormality identified. Sinuses/Orbits: Clear. Other: No acute orbit or scalp soft tissue findings. IMPRESSION: No acute intracranial abnormality. Unremarkable for age non contrast CT appearance of the brain. Electronically Signed   By: Genevie Ann M.D.   On: 02/10/2018 11:24   Dg Knee Complete 4 Views Left  Result Date: 02/10/2018 CLINICAL DATA:  Left knee pain after fall. EXAM: LEFT KNEE - COMPLETE 4+ VIEW COMPARISON:  None. FINDINGS: Status post left total knee arthroplasty. The femoral and tibial components appear to be well situated. Vascular calcifications are noted. No fracture or dislocation is noted. IMPRESSION: Status post left total knee arthroplasty. No acute abnormality seen in the left knee. Electronically Signed   By: Marijo Conception, M.D.   On: 02/10/2018 10:13   Dg Hips Bilat W Or Wo Pelvis 3-4 Views  Result Date: 02/10/2018 CLINICAL DATA:  Bilateral hip pain after fall. EXAM: DG HIP (WITH OR WITHOUT PELVIS) 3-4V BILAT COMPARISON:  None. FINDINGS: There is no evidence of hip fracture or dislocation. There is no evidence of arthropathy or  other focal bone abnormality. IMPRESSION: Normal bilateral hips. Electronically Signed   By: Marijo Conception, M.D.   On: 02/10/2018 10:11    Procedures Procedures (including critical care time)  Medications Ordered in ED Medications - No data to display   Initial Impression / Assessment and Plan / ED Course  I have reviewed the triage vital signs and the nursing notes.  Pertinent labs & imaging results that were available during my care of the patient were reviewed by me and considered in my medical decision making (see chart for details).     Patient presenting for evaluation of low back and buttock pain after a fall.  Physical exam reassuring, no obvious neurologic deficits.  Patient has ambulated since the fall without difficulty.  No obvious deformity.  Will obtain x-ray of thoracic and lumbar spine, hips, and left knee.  CT head ordered  due to patient's age and possible blood thinner use.  X-rays viewed and interpreted by me, they are reassuring without fracture dislocation.  CT head without bleed or other acute finding.  Case discussed with attending, Dr. Kathrynn Humble evaluated the patient.  Discussed findings with patient.  Discussed treatment with Tylenol.  Follow-up with PCP early next week for reevaluation.  At this time, patient appears safe for discharge.  Return precautions given.  Patient states he understands and agrees to plan.   Final Clinical Impressions(s) / ED Diagnoses   Final diagnoses:  Acute midline low back pain without sciatica  Fall, initial encounter  Muscle pain    ED Discharge Orders    None       Franchot Heidelberg, PA-C 02/10/18 [place], [place], MD 02/11/18 [phone]

## 2018-02-10 NOTE — ED Notes (Signed)
Patient transported to CT 

## 2018-02-10 NOTE — ED Triage Notes (Signed)
Per EMS, pt from home. Unwitnessed, mechanical fall, no deformity, c/o sacral pain, is ambulatory, no LOC.

## 2018-02-10 NOTE — ED Notes (Signed)
Bed: WA04 Expected date:  Expected time:  Means of arrival:  Comments: EMS-fall 

## 2018-03-18 ENCOUNTER — Ambulatory Visit: Payer: Medicare Other | Admitting: Cardiology

## 2018-04-15 ENCOUNTER — Encounter: Payer: Self-pay | Admitting: Cardiology

## 2018-04-26 ENCOUNTER — Other Ambulatory Visit: Payer: Self-pay | Admitting: Internal Medicine

## 2018-04-26 DIAGNOSIS — J8410 Pulmonary fibrosis, unspecified: Secondary | ICD-10-CM

## 2018-05-06 ENCOUNTER — Inpatient Hospital Stay
Admission: RE | Admit: 2018-05-06 | Discharge: 2018-05-06 | Disposition: A | Discharge status: Home / Self Care | Payer: Medicare Other | Source: Ambulatory Visit | Attending: Internal Medicine | Admitting: Internal Medicine

## 2018-06-17 ENCOUNTER — Ambulatory Visit
Admission: RE | Admit: 2018-06-17 | Discharge: 2018-06-17 | Disposition: A | Discharge status: Home / Self Care | Payer: Medicare Other | Source: Ambulatory Visit | Attending: Internal Medicine | Admitting: Internal Medicine

## 2018-06-17 DIAGNOSIS — J8410 Pulmonary fibrosis, unspecified: Secondary | ICD-10-CM

## 2018-06-22 ENCOUNTER — Ambulatory Visit (INDEPENDENT_AMBULATORY_CARE_PROVIDER_SITE_OTHER): Payer: Medicare Other | Admitting: Physician Assistant

## 2018-06-22 ENCOUNTER — Encounter: Payer: Self-pay | Admitting: Physician Assistant

## 2018-06-22 VITALS — BP 116/60 | HR 49 | Ht 60.0 in | Wt 111.0 lb

## 2018-06-22 DIAGNOSIS — E039 Hypothyroidism, unspecified: Secondary | ICD-10-CM

## 2018-06-22 DIAGNOSIS — I10 Essential (primary) hypertension: Secondary | ICD-10-CM | POA: Diagnosis not present

## 2018-06-22 DIAGNOSIS — I480 Paroxysmal atrial fibrillation: Secondary | ICD-10-CM

## 2018-06-22 DIAGNOSIS — T462X5A Adverse effect of other antidysrhythmic drugs, initial encounter: Secondary | ICD-10-CM

## 2018-06-22 DIAGNOSIS — J984 Other disorders of lung: Secondary | ICD-10-CM | POA: Diagnosis not present

## 2018-06-22 NOTE — Patient Instructions (Addendum)
Medication Instructions:  Your physician has recommended you make the following change in your medication:   1. STOP: amiodarone  2. TAKE: eliquis 2.5 mg TWICE A DAY  3. TAKE: metoprolol tartrate (lopressor) 50 mg TWICE A DAY  Labwork: None ordered  Testing/Procedures: None ordered  Follow-Up: Your physician recommends that you follow-up with Doristine Devoid, NP in the Afib Clinic on 07/13/18 at 2:00 PM    Any Other Special Instructions Will Be Listed Below (If Applicable).     If you need a refill on your cardiac medications before your next appointment, please call your pharmacy.

## 2018-06-22 NOTE — Progress Notes (Signed)
Cardiology Office Note    Date:  06/22/2018   ID:  Para March, DOB 04/23/1933, MRN [id number]  PCP:  [name], MD  Cardiologist: Fransico Him, MD EPS:  Afib clinic  Chief Complaint  Patient presents with  . Follow-up    History of Present Illness:  Dominique [name] is a 84 y.o. female with history of PAF on amiodarone and Eliquis 2.5 mg twice daily because of age and weight.  Chads vas score equals 4.  Patient also has hypertension, hypothyroidism and chronic hyponatremia although normal at 138 on labs scanned 04/15/2018.  04/2018 CT scan of the chest shows nonspecific peritracheal adenopathy they are questioning amiodarone toxicity.  Patient comes in today accompanied by her son.  She lives alone but is unaware what medication she is taking.  She is only taking the Eliquis once a day.  They are not sure whether or not she is taking the metoprolol.  She denies shortness of breath or palpitations.   Past Medical History:  Diagnosis Date  . Anemia   . Cancer ([place])    uterine  . Hypertension   . Hyponatremia   . Hypothyroidism   . LVH (left ventricular hypertrophy)   . PAF (paroxysmal atrial fibrillation) ([place])    a. initial dx 2012 in setting of acute illness. b. began to recur 09/2017; started on amiodarone 11/2017.  Marland Kitchen Shingles   . Thrombocytopenia ([place])     Past Surgical History:  Procedure Laterality Date  . ABDOMINAL HYSTERECTOMY    . JOINT REPLACEMENT Bilateral    knees    Current Medications: Current Meds  Medication Sig  . apixaban (ELIQUIS) 2.5 MG TABS tablet Take 1 tablet (2.5 mg total) by mouth 2 (two) times daily.  Marland Kitchen augmented betamethasone dipropionate (DIPROLENE-AF) 0.05 % ointment Apply 1 application topically 2 (two) times daily.  Marland Kitchen levothyroxine (SYNTHROID, LEVOTHROID) 50 MCG tablet Take 50 mcg by mouth daily before breakfast.  . metoprolol tartrate (LOPRESSOR) 50 MG tablet Take 1 tablet (50 mg total) by mouth 2 (two) times daily. (Patient  taking differently: Take 50 mg by mouth daily. )  . mupirocin ointment (BACTROBAN) 2 % Apply 1 application topically 2 (two) times daily.  . simvastatin (ZOCOR) 20 MG tablet Take 20 mg by mouth daily.  . [DISCONTINUED] amiodarone (PACERONE) 200 MG tablet Take 200 mg by mouth daily.     Allergies:   Codeine; Darvocet [propoxyphene n-acetaminophen]; Demerol; Percocet [oxycodone-acetaminophen]; and Prednisone   Social History   Socioeconomic History  . Marital status: Widowed    Spouse name: 08/11/2014  . Number of children: Not on file  . Years of education: Not on file  . Highest education level: Not on file  Occupational History  . Occupation: retired Theme park manager  Social Needs  . Financial resource strain: Not on file  . Food insecurity:    Worry: Not on file    Inability: Not on file  . Transportation needs:    Medical: Not on file    Non-medical: Not on file  Tobacco Use  . Smoking status: Never Smoker  . Smokeless tobacco: Never Used  Substance and Sexual Activity  . Alcohol use: No  . Drug use: No  . Sexual activity: Never  Lifestyle  . Physical activity:    Days per week: Not on file    Minutes per session: Not on file  . Stress: Not on file  Relationships  . Social connections:    Talks on phone: Not on  file    Gets together: Not on file    Attends religious service: Not on file    Active member of club or organization: Not on file    Attends meetings of clubs or organizations: Not on file    Relationship status: Not on file  Other Topics Concern  . Not on file  Social History Narrative   Lives alone     Family History:  The patient's family history includes Alcoholism in her brother; Hypertension in her son; Tuberculosis in her father.   ROS:   Please see the history of present illness.    Review of Systems  Constitution: Positive for malaise/fatigue.  Respiratory: Positive for snoring.    All other systems reviewed and are negative.   PHYSICAL  EXAM:   VS:  BP 116/60 (BP Location: Left Arm, Patient Position: Sitting, Cuff Size: Normal)   Pulse (!) 49   Ht 5' (1.524 m)   Wt 111 lb (50.3 kg)   SpO2 96% Comment: at rest  BMI 21.68 kg/m   Physical Exam  GEN: N, elderly, in no acute distress  Neck: no JVD, carotid bruits, or masses Cardiac:RRR; no murmurs, rubs, or gallops  Respiratory:  clear to auscultation bilaterally, normal work of breathing GI: soft, nontender, nondistended, + BS Ext: without cyanosis, clubbing, or edema, Good distal pulses bilaterally Neuro:  Alert and Oriented x 3 Psych: euthymic mood, full affect  Wt Readings from Last 3 Encounters:  06/22/18 111 lb (50.3 kg)  11/29/17 121 lb (54.9 kg)  11/22/17 119 lb (54 kg)      Studies/Labs Reviewed:   EKG:  EKG is not ordered today.    Recent Labs: 09/29/2017: Magnesium 1.8; TSH 2.640 11/18/2017: Hemoglobin 12.7; Platelets 393 11/29/2017: ALT 19; BUN 13; Creatinine, Ser 0.84; Potassium 4.7; Sodium 135   Lipid Panel No results found for: CHOL, TRIG, HDL, CHOLHDL, VLDL, LDLCALC, LDLDIRECT  Additional studies/ records that were reviewed today include:  CT scan 9/6/2019IMPRESSION: 1. Basilar predominant fibrotic interstitial lung disease with moderate honeycombing, typical of usual interstitial pneumonia (UIP). 2. Superimposed numerous poorly marginated subsolid pulmonary nodules throughout both lungs with ground-glass halos, largest 1.2 cm in the left upper lobe. Differential is broad and includes atypical infection (particularly if the patient is immunocompromised), vasculitis or metastatic disease. Short-term chest CT follow-up advised. Bronchoscopy evaluation may be obtained as clinically warranted. 3. Nonspecific mild right paratracheal adenopathy, which can also be reassessed on follow-up chest CT. 4. Patchy sclerosis in the manubrium with suggestion of subtle healing nondisplaced manubrial fracture. Correlate for any history of recent injury. 5.  Diffuse liver parenchymal hyperdensity, correlate for history of amiodarone therapy. 6. Mild cardiomegaly. Left main and 3 vessel coronary atherosclerosis.     ASSESSMENT:    1. PAF (paroxysmal atrial fibrillation) ([place])   2. Amiodarone pulmonary toxicity   3. Essential hypertension   4. Hypothyroidism, unspecified type      PLAN:  In order of problems listed above:  PAF on amiodarone and Eliquis 2.5 mg twice daily based on weight and age.  CHA2DS2-VASc equals 4-patient only taking Eliquis once a day.  Reiterated the importance of taking this drug twice a day with food.  She said she will.  Now amiodarone toxic based on CT scan.  Will stop amiodarone and set her up in the A. fib clinic to discuss alternative therapy maintaining normal sinus rhythm.  He did not tolerate atrial fibrillation well.  Amiodarone toxicity as discussed above.  Please refer to  CT scan  Essential hypertension was on metoprolol 50 mg twice a day.  Son will verify that she is still taking this when he checks her meds at home.  Hypothyroidism managed by PCP    Medication Adjustments/Labs and Tests Ordered: Current medicines are reviewed at length with the patient today.  Concerns regarding medicines are outlined above.  Medication changes, Labs and Tests ordered today are listed in the Patient Instructions below. Patient Instructions  Medication Instructions:  Your physician has recommended you make the following change in your medication:   1. STOP: amiodarone  2. TAKE: eliquis 2.5 mg TWICE A DAY  3. TAKE: metoprolol tartrate (lopressor) 50 mg TWICE A DAY  Labwork: None ordered  Testing/Procedures: None ordered  Follow-Up: Your physician recommends that you follow-up with Doristine Devoid, NP in the Afib Clinic on 07/13/18 at 2:00 PM    Any Other Special Instructions Will Be Listed Below (If Applicable).     If you need a refill on your cardiac medications before your next appointment, please  call your pharmacy.      Sumner Boast, PA-C  06/22/2018 2:02 PM    [facility] [address], [place], [place]  [address] Phone: [phone]; Fax: [phone]

## 2018-07-06 ENCOUNTER — Institutional Professional Consult (permissible substitution): Payer: Medicare Other | Admitting: Emergency Medicine

## 2018-07-13 ENCOUNTER — Ambulatory Visit (HOSPITAL_COMMUNITY)
Admission: RE | Admit: 2018-07-13 | Discharge: 2018-07-13 | Disposition: A | Discharge status: Home / Self Care | Payer: Medicare Other | Source: Ambulatory Visit | Attending: Nurse Practitioner | Admitting: Nurse Practitioner

## 2018-07-13 VITALS — BP 162/80 | HR 48 | Ht 60.0 in | Wt 111.0 lb

## 2018-07-13 DIAGNOSIS — Z8249 Family history of ischemic heart disease and other diseases of the circulatory system: Secondary | ICD-10-CM | POA: Insufficient documentation

## 2018-07-13 DIAGNOSIS — Z90710 Acquired absence of both cervix and uterus: Secondary | ICD-10-CM | POA: Diagnosis not present

## 2018-07-13 DIAGNOSIS — Z885 Allergy status to narcotic agent status: Secondary | ICD-10-CM | POA: Insufficient documentation

## 2018-07-13 DIAGNOSIS — Z96653 Presence of artificial knee joint, bilateral: Secondary | ICD-10-CM | POA: Insufficient documentation

## 2018-07-13 DIAGNOSIS — E039 Hypothyroidism, unspecified: Secondary | ICD-10-CM | POA: Insufficient documentation

## 2018-07-13 DIAGNOSIS — T462X5A Adverse effect of other antidysrhythmic drugs, initial encounter: Secondary | ICD-10-CM | POA: Diagnosis not present

## 2018-07-13 DIAGNOSIS — I480 Paroxysmal atrial fibrillation: Secondary | ICD-10-CM | POA: Diagnosis not present

## 2018-07-13 DIAGNOSIS — Z8542 Personal history of malignant neoplasm of other parts of uterus: Secondary | ICD-10-CM | POA: Diagnosis not present

## 2018-07-13 DIAGNOSIS — Z79890 Hormone replacement therapy (postmenopausal): Secondary | ICD-10-CM | POA: Diagnosis not present

## 2018-07-13 DIAGNOSIS — J984 Other disorders of lung: Secondary | ICD-10-CM | POA: Diagnosis not present

## 2018-07-13 DIAGNOSIS — I10 Essential (primary) hypertension: Secondary | ICD-10-CM | POA: Diagnosis not present

## 2018-07-13 DIAGNOSIS — Z7901 Long term (current) use of anticoagulants: Secondary | ICD-10-CM | POA: Diagnosis not present

## 2018-07-13 DIAGNOSIS — Z79899 Other long term (current) drug therapy: Secondary | ICD-10-CM | POA: Insufficient documentation

## 2018-07-13 NOTE — Addendum Note (Signed)
Encounter addended by: Sherran Needs, NP on: 07/13/2018 3:07 PM  Actions taken: Sign clinical note, Visit diagnoses modified

## 2018-07-13 NOTE — Progress Notes (Addendum)
Primary Care Physician: [name], MD Referring Physician:   Aireana [name] [name] is a 84 y.o. female with a h/o paroxysmal atrial fibrillation that has been maintaining SR on amiodarone. It was recently stopped 9/11 2/2 to recent CT showing presence of interstitial  lung disease/amiodaorne toxicity. Amiodarone . She continues in SR but is here to discuss options to stay in SR, now off amiodarone.  Today, she denies symptoms of palpitations, chest pain, shortness of breath, orthopnea, PND, lower extremity edema, dizziness, presyncope, syncope, or neurologic sequela. The patient is tolerating medications without difficulties and is otherwise without complaint today.   Past Medical History:  Diagnosis Date  . Anemia   . Cancer ([place])    uterine  . Hypertension   . Hyponatremia   . Hypothyroidism   . LVH (left ventricular hypertrophy)   . PAF (paroxysmal atrial fibrillation) ([place])    a. initial dx 2012 in setting of acute illness. b. began to recur 09/2017; started on amiodarone 11/2017.  Marland Kitchen Shingles   . Thrombocytopenia ([place])    Past Surgical History:  Procedure Laterality Date  . ABDOMINAL HYSTERECTOMY    . JOINT REPLACEMENT Bilateral    knees    Current Outpatient Medications  Medication Sig Dispense Refill  . apixaban (ELIQUIS) 2.5 MG TABS tablet Take 1 tablet (2.5 mg total) by mouth 2 (two) times daily. 60 tablet 6  . augmented betamethasone dipropionate (DIPROLENE-AF) 0.05 % ointment Apply 1 application topically 2 (two) times daily.    . clotrimazole (MYCELEX) 10 MG troche Take 10 mg by mouth 5 (five) times daily.    Marland Kitchen levothyroxine (SYNTHROID, LEVOTHROID) 50 MCG tablet Take 50 mcg by mouth daily before breakfast.    . metoprolol tartrate (LOPRESSOR) 50 MG tablet Take 1 tablet (50 mg total) by mouth 2 (two) times daily. (Patient taking differently: Take 50 mg by mouth daily. ) 60 tablet 6  . simvastatin (ZOCOR) 20 MG tablet Take 20 mg by mouth daily.    . mupirocin ointment  (BACTROBAN) 2 % Apply 1 application topically 2 (two) times daily.     No current facility-administered medications for this encounter.     Allergies  Allergen Reactions  . Codeine   . Darvocet [Propoxyphene N-Acetaminophen]   . Demerol   . Percocet [Oxycodone-Acetaminophen]   . Prednisone     Uncertain this is true. Was documented when patient was admitted 02/12/2012, but not recalled by either the patient nor her son. Tolerating it easily 08/2016.     Social History   Socioeconomic History  . Marital status: Widowed    Spouse name: 08/11/2014  . Number of children: Not on file  . Years of education: Not on file  . Highest education level: Not on file  Occupational History  . Occupation: retired Theme park manager  Social Needs  . Financial resource strain: Not on file  . Food insecurity:    Worry: Not on file    Inability: Not on file  . Transportation needs:    Medical: Not on file    Non-medical: Not on file  Tobacco Use  . Smoking status: Never Smoker  . Smokeless tobacco: Never Used  Substance and Sexual Activity  . Alcohol use: No  . Drug use: No  . Sexual activity: Never  Lifestyle  . Physical activity:    Days per week: Not on file    Minutes per session: Not on file  . Stress: Not on file  Relationships  . Social connections:  Talks on phone: Not on file    Gets together: Not on file    Attends religious service: Not on file    Active member of club or organization: Not on file    Attends meetings of clubs or organizations: Not on file    Relationship status: Not on file  . Intimate partner violence:    Fear of current or ex partner: Not on file    Emotionally abused: Not on file    Physically abused: Not on file    Forced sexual activity: Not on file  Other Topics Concern  . Not on file  Social History Narrative   Lives alone    Family History  Problem Relation Age of Onset  . Tuberculosis Father   . Alcoholism Brother   . Hypertension Son      ROS- All systems are reviewed and negative except as per the HPI above  Physical Exam: Vitals:   07/13/18 1415  BP: (!) 162/80  Pulse: (!) 48  Weight: 50.3 kg  Height: 5' (1.524 m)   Wt Readings from Last 3 Encounters:  07/13/18 50.3 kg  06/22/18 50.3 kg  11/29/17 54.9 kg    Labs: Lab Results  Component Value Date   NA 135 11/29/2017   K 4.7 11/29/2017   CL 97 11/29/2017   CO2 23 11/29/2017   GLUCOSE 90 11/29/2017   BUN 13 11/29/2017   CREATININE 0.84 11/29/2017   CALCIUM 9.6 11/29/2017   MG 1.8 09/29/2017   Lab Results  Component Value Date   INR 1.29 02/16/2011   No results found for: CHOL, HDL, LDLCALC, TRIG   GEN- The patient is well appearing, alert and oriented x 3 today.   Head- normocephalic, atraumatic Eyes-  Sclera clear, conjunctiva pink Ears- hearing intact Oropharynx- clear Neck- supple, no JVP Lymph- no cervical lymphadenopathy Lungs- few crackles heard in the bases, normal work of breathing Heart- Regular rate and rhythm, no murmurs, rubs or gallops, PMI not laterally displaced GI- soft, NT, ND, + BS Extremities- no clubbing, cyanosis, or edema MS- no significant deformity or atrophy Skin- no rash or lesion Psych- euthymic mood, full affect Neuro- strength and sensation are intact  EKG- Sinus brady at 48 bpm, pr int 210 ms, qrs int 84 ms, qtc 471ms     Assessment and Plan: 1. afib In SR today Amiodarone stopped 9/11 for concerns of amiodarone toxicity by abnormal CT She will see pulmonology tomorrow for f/u Unfortunately, cannot start another antiarrythmic at this point until amiodarone washes out may take 2-3 months Will get an amiodarone level today Will bring back in one month and will draw another level then and continue to track until amio is sufficiency washed out For now no changes in therapy  2. Chadsvasc score of 3 Continue eliquis 2.5 mg bid   F/u in one month, sooner if afib returns  Butch Penny C. Advit Trethewey, [facility] [address] [place], [place] [address] [phone]

## 2018-07-14 ENCOUNTER — Ambulatory Visit (INDEPENDENT_AMBULATORY_CARE_PROVIDER_SITE_OTHER): Payer: Medicare Other | Admitting: Emergency Medicine

## 2018-07-14 ENCOUNTER — Encounter: Payer: Self-pay | Admitting: Emergency Medicine

## 2018-07-14 VITALS — BP 126/74 | HR 50 | Ht <= 58 in | Wt 109.4 lb

## 2018-07-14 DIAGNOSIS — R911 Solitary pulmonary nodule: Secondary | ICD-10-CM | POA: Diagnosis not present

## 2018-07-14 DIAGNOSIS — R9389 Abnormal findings on diagnostic imaging of other specified body structures: Secondary | ICD-10-CM

## 2018-07-14 LAB — AMIODARONE LEVEL
Amiodarone Lvl: 1.3 ug/mL (ref 1.0–2.5)
N-DESETHYL-AMIODARONE: 1 ug/mL (ref 1.0–2.5)

## 2018-07-14 NOTE — Progress Notes (Signed)
Subjective:    Patient ID: Dominique [name], female    DOB: 04-03-1933, 84 y.o.   MRN: [id number]  HPI Dominique [name] is a an 84 year old never smoker with a history of paroxysmal atrial fibrillation on anticoagulation and formally on amiodarone for approximately 9 months, stopped 2 weeks ago.  Also with a history of uterine cancer (30 yrs ago), hypertension. She had a CXR at her annual physical w Dr Dagmar Hait a few months ago, prompted a CT chest 06/17/18 which I reviewed.  This shows some base predominant fibrotic interstitial change with some adjacent associated groundglass.  There is scattered groundglass versus air-trapping throughout both lungs.  Also noted are numerous poorly marginated sub-solid pulmonary nodules with groundglass halos.  Etiology unclear.  There is also some nonspecific right paratracheal adenopathy  She has felt well, no new sx. She has some exertional dyspnea, no cough currently, no hemoptysis.    Review of Systems  Constitutional: Negative for fever and unexpected weight change.  HENT: Positive for dental problem. Negative for congestion, ear pain, nosebleeds, postnasal drip, rhinorrhea, sinus pressure, sneezing, sore throat and trouble swallowing.   Eyes: Negative for redness and itching.  Respiratory: Positive for shortness of breath. Negative for cough, chest tightness and wheezing.   Cardiovascular: Positive for palpitations. Negative for leg swelling.  Gastrointestinal: Negative for nausea and vomiting.  Genitourinary: Negative for dysuria.  Musculoskeletal: Negative for joint swelling.  Skin: Negative for rash.  Neurological: Negative for headaches.  Hematological: Does not bruise/bleed easily.  Psychiatric/Behavioral: Negative for dysphoric mood. The patient is not nervous/anxious.    Past Medical History:  Diagnosis Date  . Anemia   . Cancer ([place])    uterine  . Hypertension   . Hyponatremia   . Hypothyroidism   . LVH (left ventricular hypertrophy)   . PAF  (paroxysmal atrial fibrillation) ([place])    a. initial dx 2012 in setting of acute illness. b. began to recur 09/2017; started on amiodarone 11/2017.  Marland Kitchen Shingles   . Thrombocytopenia ([place])      Family History  Problem Relation Age of Onset  . Tuberculosis Father   . Alcoholism Brother   . Hypertension Son      Social History   Socioeconomic History  . Marital status: Widowed    Spouse name: 08/11/2014  . Number of children: Not on file  . Years of education: Not on file  . Highest education level: Not on file  Occupational History  . 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 Financial resource strain: Not on file  . Food insecurity:    Worry: Not on file    Inability: Not on file  . Transportation needs:    Medical: Not on file    Non-medical: Not on file  Tobacco Use  . Smoking status: Never Smoker  . Smokeless tobacco: Never Used  Substance and Sexual Activity  . Alcohol use: No  . Drug use: No  . Sexual activity: Never  Lifestyle  . Physical activity:    Days per week: Not on file    Minutes per session: Not on file  . Stress: Not on file  Relationships  . Social connections:    Talks on phone: Not on file    Gets together: Not on file    Attends religious service: Not on file    Active member of club or organization: Not on file    Attends meetings of clubs or  organizations: Not on file    Relationship status: Not on file  . Intimate partner violence:    Fear of current or ex partner: Not on file    Emotionally abused: Not on file    Physically abused: Not on file    Forced sexual activity: Not on file  Other Topics Concern  . Not on file  Social History Narrative   Lives alone     Allergies  Allergen Reactions  . Codeine   . Darvocet [Propoxyphene N-Acetaminophen]   . Demerol   . Percocet [Oxycodone-Acetaminophen]   . Prednisone     Uncertain this is true. Was documented when patient was admitted 02/12/2012, but  not recalled by either the patient nor her son. Tolerating it easily 08/2016.      Outpatient Medications Prior to Visit  Medication Sig Dispense Refill  . acetaminophen (TYLENOL) 500 MG tablet Take 500 mg by mouth every 6 (six) hours as needed.    Marland Kitchen apixaban (ELIQUIS) 2.5 MG TABS tablet Take 1 tablet (2.5 mg total) by mouth 2 (two) times daily. 60 tablet 6  . clotrimazole (MYCELEX) 10 MG troche Take 10 mg by mouth 5 (five) times daily.    Marland Kitchen levothyroxine (SYNTHROID, LEVOTHROID) 50 MCG tablet Take 50 mcg by mouth daily before breakfast.    . metoprolol tartrate (LOPRESSOR) 50 MG tablet Take 1 tablet (50 mg total) by mouth 2 (two) times daily. (Patient taking differently: Take 50 mg by mouth daily. ) 60 tablet 6  . simvastatin (ZOCOR) 20 MG tablet Take 20 mg by mouth daily.    Marland Kitchen augmented betamethasone dipropionate (DIPROLENE-AF) 0.05 % ointment Apply 1 application topically 2 (two) times daily.    . mupirocin ointment (BACTROBAN) 2 % Apply 1 application topically 2 (two) times daily.     No facility-administered medications prior to visit.         Objective:   Physical Exam Vitals:   07/14/18 1153  BP: 126/74  Pulse: (!) 50  SpO2: 98%  Weight: 109 lb 6.4 oz (49.6 kg)  Height: 4\' 9"  (1.448 m)   Gen: Pleasant, well-nourished, in no distress,  normal affect  ENT: No lesions,  mouth clear,  oropharynx clear, no postnasal drip  Neck: No JVD, no stridor  Lungs: No use of accessory muscles, no wheeze, R>L basilar insp crackles.   Cardiovascular: RRR, heart sounds normal, no murmur or gallops, no peripheral edema  Musculoskeletal: No deformities, no cyanosis or clubbing  Neuro: alert, non focal  Skin: Warm, no lesions or rash     Assessment & Plan:  Abnormal CT of the chest Patient has an abnormal CT scan of the chest with multiple focal abnormalities.  She has some scattered very mild groundglass versus air trapping.  She also has some more focal loosely formed nodular  infiltrates bilaterally.  Finally she has some basilar interstitial disease that looks like evolving honeycomb and a UIP pattern.  Differential diagnosis is broad, etiology is unclear.  Consider autoimmune disease (no history of such), occult indolent infection such as Mycobacterium avium, consider malignancy.  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  She stopped the amiodarone about 2 weeks ago.  I would like to repeat her CT scan of the chest in mid November.  Based on that result we will decide whether further work-up is merited.  If the infiltrates are not resolving then I think she likely needs bronchoscopy, possibly PET scan.    Baltazar Apo, MD,  PhD 07/14/2018, 2:57 PM [facility] [phone] or if no answer [phone]

## 2018-07-14 NOTE — Assessment & Plan Note (Signed)
Patient has an abnormal CT scan of the chest with multiple focal abnormalities.  She has some scattered very mild groundglass versus air trapping.  She also has some more focal loosely formed nodular infiltrates bilaterally.  Finally she has some basilar interstitial disease that looks like evolving honeycomb and a UIP pattern.  Differential diagnosis is broad, etiology is unclear.  Consider autoimmune disease (no history of such), occult indolent infection such as Mycobacterium avium, consider malignancy.  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  She stopped the amiodarone about 2 weeks ago.  I would like to repeat her CT scan of the chest in mid November.  Based on that result we will decide whether further work-up is merited.  If the infiltrates are not resolving then I think she likely needs bronchoscopy, possibly PET scan.

## 2018-07-14 NOTE — Patient Instructions (Signed)
We will repeat your CT scan of the chest in mid November to follow your pulmonary nodules and interstitial lung changes. Agree with staying off amiodarone. Follow with Dr Lamonte Sakai in November after the CT scan to review the results together.

## 2018-08-19 ENCOUNTER — Ambulatory Visit: Payer: Self-pay

## 2018-08-19 NOTE — Telephone Encounter (Signed)
Patient's son called in with c/o "fall." He says "she fell 2 days ago and her left knee is a little swollen, not as bad a yesterday she says, and she has a blister on it the size of a half dollar that is draining pinkish fluid. She's on Eliquis. It is bruised, but the bruise is not as bad as it was when the fall happened. She can walk on it and she says it's not that painful." I advised to go to the UC and gave [facility] address, he verbalized understanding.   Reason for Disposition . Large swelling or bruise (> 2 inches or 5 cm)  Answer Assessment - Initial Assessment Questions 1. MECHANISM: "How did the injury happen?" (e.g., twisting injury, direct blow)      Fall  2. ONSET: "When did the injury happen?" (Minutes or hours ago)      2 days ago 3. LOCATION: "Where is the injury located?"      Left knee 4. APPEARANCE of INJURY: "What does the injury look like?"      Bruised, blister 5. SEVERITY: "Can you put weight on that leg?" "Can you walk?"      Yes 6. SIZE: For cuts, bruises, or swelling, ask: "How large is it?" (e.g., inches or centimeters;  entire joint)      Swelling, not that much difference between both knees 7. PAIN: "Is there pain?" If so, ask: "How bad is the pain?"    (e.g., Scale 1-10; or mild, moderate, severe)     Mild 8. TETANUS: For any breaks in the skin, ask: "When was the last tetanus booster?"     Unknown 9. OTHER SYMPTOMS: "Do you have any other symptoms?"  (e.g., "pop" when knee injured, swelling, locking, buckling)      Swelling 10. PREGNANCY: "Is there any chance you are pregnant?" "When was your last menstrual period?"       No  Protocols used: KNEE INJURY-A-AH

## 2018-08-22 ENCOUNTER — Ambulatory Visit (HOSPITAL_COMMUNITY)
Admission: RE | Admit: 2018-08-22 | Discharge: 2018-08-22 | Disposition: A | Discharge status: Home / Self Care | Payer: Medicare Other | Source: Ambulatory Visit | Attending: Nurse Practitioner | Admitting: Nurse Practitioner

## 2018-08-22 ENCOUNTER — Encounter (HOSPITAL_COMMUNITY): Payer: Self-pay | Admitting: Nurse Practitioner

## 2018-08-22 VITALS — BP 164/84 | HR 51 | Ht <= 58 in | Wt 113.0 lb

## 2018-08-22 DIAGNOSIS — Z8542 Personal history of malignant neoplasm of other parts of uterus: Secondary | ICD-10-CM | POA: Insufficient documentation

## 2018-08-22 DIAGNOSIS — Z888 Allergy status to other drugs, medicaments and biological substances status: Secondary | ICD-10-CM | POA: Diagnosis not present

## 2018-08-22 DIAGNOSIS — Z79899 Other long term (current) drug therapy: Secondary | ICD-10-CM | POA: Diagnosis not present

## 2018-08-22 DIAGNOSIS — Z885 Allergy status to narcotic agent status: Secondary | ICD-10-CM | POA: Diagnosis not present

## 2018-08-22 DIAGNOSIS — I480 Paroxysmal atrial fibrillation: Secondary | ICD-10-CM | POA: Diagnosis present

## 2018-08-22 DIAGNOSIS — E039 Hypothyroidism, unspecified: Secondary | ICD-10-CM | POA: Diagnosis not present

## 2018-08-22 DIAGNOSIS — Z7901 Long term (current) use of anticoagulants: Secondary | ICD-10-CM | POA: Insufficient documentation

## 2018-08-22 DIAGNOSIS — I10 Essential (primary) hypertension: Secondary | ICD-10-CM | POA: Insufficient documentation

## 2018-08-22 DIAGNOSIS — Z79890 Hormone replacement therapy (postmenopausal): Secondary | ICD-10-CM | POA: Insufficient documentation

## 2018-08-22 NOTE — Progress Notes (Signed)
Primary Care Physician: Dominique Solian, MD Referring Physician:Dr. Era Parr [name] is a 84 y.o. female with a h/o paroxysmal atrial fibrillation that has been maintaining SR on amiodarone. It was recently stopped 9/11 2/2 to recent CT showing presence of interstitial  lung disease/amiodaorne toxicity. She continues in SR but is here to discuss options to stay in SR, now off amiodarone.  F/u afib clinic 11/11. She is still washing out amiodarone. She is enjoying SR. No awareness of any afib. Is  pending a CT of chest next week for f/u lung disease.   Today, she denies symptoms of palpitations, chest pain, shortness of breath, orthopnea, PND, lower extremity edema, dizziness, presyncope, syncope, or neurologic sequela. The patient is tolerating medications without difficulties and is otherwise without complaint today.   Past Medical History:  Diagnosis Date  . Anemia   . Cancer ([place])    uterine  . Hypertension   . Hyponatremia   . Hypothyroidism   . LVH (left ventricular hypertrophy)   . PAF (paroxysmal atrial fibrillation) ([place])    a. initial dx 2012 in setting of acute illness. b. began to recur 09/2017; started on amiodarone 11/2017.  Marland Kitchen Shingles   . Thrombocytopenia ([place])    Past Surgical History:  Procedure Laterality Date  . ABDOMINAL HYSTERECTOMY    . JOINT REPLACEMENT Bilateral    knees    Current Outpatient Medications  Medication Sig Dispense Refill  . acetaminophen (TYLENOL) 500 MG tablet Take 500 mg by mouth every 6 (six) hours as needed.    Marland Kitchen apixaban (ELIQUIS) 2.5 MG TABS tablet Take 1 tablet (2.5 mg total) by mouth 2 (two) times daily. 60 tablet 6  . levothyroxine (SYNTHROID, LEVOTHROID) 50 MCG tablet Take 50 mcg by mouth daily before breakfast.    . metoprolol tartrate (LOPRESSOR) 50 MG tablet Take 50 mg by mouth daily.    . simvastatin (ZOCOR) 20 MG tablet Take 20 mg by mouth daily.     No current facility-administered medications for this encounter.       Allergies  Allergen Reactions  . Codeine   . Darvocet [Propoxyphene N-Acetaminophen]   . Demerol   . Percocet [Oxycodone-Acetaminophen]   . Prednisone     Uncertain this is true. Was documented when patient was admitted 02/12/2012, but not recalled by either the patient nor her son. Tolerating it easily 08/2016.     Social History   Socioeconomic History  . Marital status: Widowed    Spouse name: 08/11/2014  . Number of children: Not on file  . Years of education: Not on file  . Highest education level: Not on file  Occupational History  . 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 Financial resource strain: Not on file  . Food insecurity:    Worry: Not on file    Inability: Not on file  . Transportation needs:    Medical: Not on file    Non-medical: Not on file  Tobacco Use  . Smoking status: Never Smoker  . Smokeless tobacco: Never Used  Substance and Sexual Activity  . Alcohol use: No  . Drug use: No  . Sexual activity: Never  Lifestyle  . Physical activity:    Days per week: Not on file    Minutes per session: Not on file  . Stress: Not on file  Relationships  . Social connections:    Talks on phone: Not on file  Gets together: Not on file    Attends religious service: Not on file    Active member of club or organization: Not on file    Attends meetings of clubs or organizations: Not on file    Relationship status: Not on file  . Intimate partner violence:    Fear of current or ex partner: Not on file    Emotionally abused: Not on file    Physically abused: Not on file    Forced sexual activity: Not on file  Other Topics Concern  . Not on file  Social History Narrative   Lives alone    Family History  Problem Relation Age of Onset  . Tuberculosis Father   . Alcoholism Brother   . Hypertension Son     ROS- All systems are reviewed and negative except as per the HPI above  Physical Exam: Vitals:    08/22/18 1142  BP: (!) 164/84  Pulse: (!) 51  Weight: 51.3 kg  Height: 4\' 9"  (1.448 m)   Wt Readings from Last 3 Encounters:  08/22/18 51.3 kg  07/14/18 49.6 kg  07/13/18 50.3 kg    Labs: Lab Results  Component Value Date   NA 135 11/29/2017   K 4.7 11/29/2017   CL 97 11/29/2017   CO2 23 11/29/2017   GLUCOSE 90 11/29/2017   BUN 13 11/29/2017   CREATININE 0.84 11/29/2017   CALCIUM 9.6 11/29/2017   MG 1.8 09/29/2017   Lab Results  Component Value Date   INR 1.29 02/16/2011   No results found for: CHOL, HDL, LDLCALC, TRIG   GEN- The patient is well appearing, alert and oriented x 3 today.   Head- normocephalic, atraumatic Eyes-  Sclera clear, conjunctiva pink Ears- hearing intact Oropharynx- clear Neck- supple, no JVP Lymph- no cervical lymphadenopathy Lungs- few crackles heard in the bases, normal work of breathing Heart- Regular rate and rhythm, no murmurs, rubs or gallops, PMI not laterally displaced GI- soft, NT, ND, + BS Extremities- no clubbing, cyanosis, or edema MS- no significant deformity or atrophy Skin- no rash or lesion Psych- euthymic mood, full affect Neuro- strength and sensation are intact  EKG- Sinus brady at 51 bpm, pr int 205 ms, qrs int 82 ms, qtc 459ms     Assessment and Plan: 1. afib In SR today Amiodarone stopped 9/11 for concerns of amiodarone toxicity by abnormal CT She has CT of chest pending 11/18 Unfortunately, cannot start another antiarrythmic at this point until amiodarone washes out may take 2-3 months Will get an amiodarone level today Will bring back in one month and will draw another level then and continue to track until amio is sufficiency washed out For now no changes in therapy  2. Chadsvasc score of 3 Continue eliquis 2.5 mg bid   F/u in one month, sooner if afib returns  Dominique [name], [facility] [address] [place], [place] [address] [phone]

## 2018-08-24 LAB — AMIODARONE LEVEL
Amiodarone Lvl: 0.9 ug/mL — ABNORMAL LOW (ref 1.0–2.5)
N-Desethyl-Amiodarone: 0.8 ug/mL — ABNORMAL LOW (ref 1.0–2.5)

## 2018-08-29 ENCOUNTER — Inpatient Hospital Stay: Admission: RE | Admit: 2018-08-29 | Payer: Medicare Other | Source: Ambulatory Visit

## 2018-09-06 ENCOUNTER — Ambulatory Visit (INDEPENDENT_AMBULATORY_CARE_PROVIDER_SITE_OTHER)
Admission: RE | Admit: 2018-09-06 | Discharge: 2018-09-06 | Disposition: A | Discharge status: Home / Self Care | Payer: Medicare Other | Source: Ambulatory Visit | Attending: Emergency Medicine | Admitting: Emergency Medicine

## 2018-09-06 DIAGNOSIS — R911 Solitary pulmonary nodule: Secondary | ICD-10-CM | POA: Diagnosis not present

## 2018-09-12 ENCOUNTER — Encounter: Payer: Self-pay | Admitting: Emergency Medicine

## 2018-09-12 ENCOUNTER — Ambulatory Visit: Payer: Medicare Other | Admitting: Emergency Medicine

## 2018-09-12 DIAGNOSIS — R9389 Abnormal findings on diagnostic imaging of other specified body structures: Secondary | ICD-10-CM | POA: Diagnosis not present

## 2018-09-12 DIAGNOSIS — J849 Interstitial pulmonary disease, unspecified: Secondary | ICD-10-CM

## 2018-09-12 NOTE — Progress Notes (Signed)
Subjective:    Patient ID: Dominique [name], female    DOB: 10-03-1933, 84 y.o.   MRN: [id number]  HPI Mrs. Dobbins is a an 84 year old never smoker with a history of paroxysmal atrial fibrillation on anticoagulation and formally on amiodarone for approximately 9 months, stopped 2 weeks ago.  Also with a history of uterine cancer (30 yrs ago), hypertension. She had a CXR at her annual physical w Dr Dagmar Hait a few months ago, prompted a CT chest 06/17/18 which I reviewed.  This shows some base predominant fibrotic interstitial change with some adjacent associated groundglass.  There is scattered groundglass versus air-trapping throughout both lungs.  Also noted are numerous poorly marginated sub-solid pulmonary nodules with groundglass halos.  Etiology unclear.  There is also some nonspecific right paratracheal adenopathy  She has felt well, no new sx. She has some exertional dyspnea, no cough currently, no hemoptysis.   ROV 09/12/18 --follow-up visit for 84 year old woman who was previously on amiodarone for paroxysmal atrial fibrillation, hypertension, remote uterine cancer.  I met her after she was found to have an abnormal CT scan of the chest with some scattered groundglass and nodular opacity superimposed on some peripheral interstitial disease that appear to be more chronic.  As above her amiodarone was stopped in late September.  We repeated a CT scan of her chest on 09/06/2018 which I have reviewed.  This shows resolution of the bilateral mixed sub-solid and solid nodular opacities and groundglass infiltrate.  There continues to be some septal thickening and reticulation with associated honeycomb change with a basilar and peripheral distribution, UIP pattern.   Review of Systems  Constitutional: Negative for fever and unexpected weight change.  HENT: Positive for dental problem. Negative for congestion, ear pain, nosebleeds, postnasal drip, rhinorrhea, sinus pressure, sneezing, sore throat and trouble  swallowing.   Eyes: Negative for redness and itching.  Respiratory: Positive for shortness of breath. Negative for cough, chest tightness and wheezing.   Cardiovascular: Positive for palpitations. Negative for leg swelling.  Gastrointestinal: Negative for nausea and vomiting.  Genitourinary: Negative for dysuria.  Musculoskeletal: Negative for joint swelling.  Skin: Negative for rash.  Neurological: Negative for headaches.  Hematological: Does not bruise/bleed easily.  Psychiatric/Behavioral: Negative for dysphoric mood. The patient is not nervous/anxious.    Past Medical History:  Diagnosis Date  . Anemia   . Cancer ([place])    uterine  . Hypertension   . Hyponatremia   . Hypothyroidism   . LVH (left ventricular hypertrophy)   . PAF (paroxysmal atrial fibrillation) ([place])    a. initial dx 2012 in setting of acute illness. b. began to recur 09/2017; started on amiodarone 11/2017.  Marland Kitchen Shingles   . Thrombocytopenia ( [place])      Family History  Problem Relation Age of Onset  . Tuberculosis Father   . Alcoholism Brother   . Hypertension Son      Social History   Socioeconomic History  . Marital status: Widowed    Spouse name: 08/11/2014  . Number of children: Not on file  . Years of education: Not on file  . Highest education level: Not on file  Occupational History  . 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 Financial resource strain: Not on file  . Food insecurity:    Worry: Not on file    Inability: Not on file  . Transportation needs:    Medical: Not on file    Non-medical:  Not on file  Tobacco Use  . Smoking status: Never Smoker  . Smokeless tobacco: Never Used  Substance and Sexual Activity  . Alcohol use: No  . Drug use: No  . Sexual activity: Never  Lifestyle  . Physical activity:    Days per week: Not on file    Minutes per session: Not on file  . Stress: Not on file  Relationships  . Social connections:    Talks on  phone: Not on file    Gets together: Not on file    Attends religious service: Not on file    Active member of club or organization: Not on file    Attends meetings of clubs or organizations: Not on file    Relationship status: Not on file  . Intimate partner violence:    Fear of current or ex partner: Not on file    Emotionally abused: Not on file    Physically abused: Not on file    Forced sexual activity: Not on file  Other Topics Concern  . Not on file  Social History Narrative   Lives alone     Allergies  Allergen Reactions  . Codeine   . Darvocet [Propoxyphene N-Acetaminophen]   . Demerol   . Percocet [Oxycodone-Acetaminophen]   . Prednisone     Uncertain this is true. Was documented when patient was admitted 02/12/2012, but not recalled by either the patient nor her son. Tolerating it easily 08/2016.      Outpatient Medications Prior to Visit  Medication Sig Dispense Refill  . acetaminophen (TYLENOL) 500 MG tablet Take 500 mg by mouth every 6 (six) hours as needed.    Marland Kitchen apixaban (ELIQUIS) 2.5 MG TABS tablet Take 1 tablet (2.5 mg total) by mouth 2 (two) times daily. 60 tablet 6  . levothyroxine (SYNTHROID, LEVOTHROID) 50 MCG tablet Take 50 mcg by mouth daily before breakfast.    . metoprolol tartrate (LOPRESSOR) 50 MG tablet Take 50 mg by mouth daily.    . simvastatin (ZOCOR) 20 MG tablet Take 20 mg by mouth daily.     No facility-administered medications prior to visit.         Objective:   Physical Exam Vitals:   09/12/18 1143  BP: 110/66  Pulse: 60  SpO2: 99%  Weight: 112 lb (50.8 kg)  Height: _0  (1.499 m)   Gen: Pleasant, well-nourished, in no distress,  normal affect  ENT: No lesions,  mouth clear,  oropharynx clear, no postnasal drip  Neck: No JVD, no stridor  Lungs: No use of accessory muscles, no wheeze, R>L basilar insp crackles.   Cardiovascular: RRR, heart sounds normal, no murmur or gallops, no peripheral edema  Musculoskeletal: No  deformities, no cyanosis or clubbing  Neuro: alert, non focal  Skin: Warm, no lesions or rash     Assessment & Plan:  Abnormal CT of the chest I suspect that her acute findings were due to amiodarone inflammatory toxicity.  These have resolved and now she is left with what looks to be stable but significant interstitial lung disease with a UIP pattern, peripheral basilar distribution.  Again I suspect that this reflects the leftovers from amiodarone, but need to evaluate for other causes.  She does not have a history of chronic aspiration.  I think she needs an autoimmune panel.  If any of these labs are positive then we will pursue.  She also needs a repeat CT scan of the chest in 6 months to ensure interval  stability.  I will have her follow-up with Dr. Chase Caller in the [facility] clinic to review.  Your CT scan of the chest shows that much of your inflammatory change that we were following has resolved.  There is still some evidence of interstitial lung disease, scarring, that needs to be followed to make sure it remains stable. Agree with staying off amiodarone, never restarting. We will perform some screening lab work to evaluate for inflammatory processes that can lead to lung scarring. We will repeat your CT scan of the chest in 6 months to look for interval stability in your interstitial lung disease. We will arrange for you to follow-up with Dr. Chase Caller in our office in 6 months to compare the CT scans and to talk about any other necessary evaluation.  Baltazar Apo, MD, PhD 09/12/2018, 12:06 PM [facility] [phone] or if no answer [phone]

## 2018-09-12 NOTE — Assessment & Plan Note (Addendum)
I suspect that her acute findings were due to amiodarone inflammatory toxicity.  These have resolved and now she is left with what looks to be stable but significant interstitial lung disease with a UIP pattern, peripheral basilar distribution.  Again I suspect that this reflects the leftovers from amiodarone, but need to evaluate for other causes.  She does not have a history of chronic aspiration.  I think she needs an autoimmune panel.  If any of these labs are positive then we will pursue.  She also needs a repeat CT scan of the chest in 6 months to ensure interval stability.  I will have her follow-up with Dr. Chase Caller in the IL[facility] clinic to review.  Your CT scan of the chest shows that much of your inflammatory change that we were following has resolved.  There is still some evidence of interstitial lung disease, scarring, that needs to be followed to make sure it remains stable. Agree with staying off amiodarone, never restarting. We will perform some screening lab work to evaluate for inflammatory processes that can lead to lung scarring. We will repeat your CT scan of the chest in 6 months to look for interval stability in your interstitial lung disease. We will arrange for you to follow-up with Dr. Chase Caller in our office in 6 months to compare the CT scans and to talk about any other necessary evaluation.

## 2018-09-12 NOTE — Patient Instructions (Signed)
Your CT scan of the chest shows that much of your inflammatory change that we were following has resolved.  There is still some evidence of interstitial lung disease, scarring, that needs to be followed to make sure it remains stable. Agree with staying off amiodarone, never restarting. We will perform some screening lab work to evaluate for inflammatory processes that can lead to lung scarring. We will repeat your CT scan of the chest in 6 months to look for interval stability in your interstitial lung disease. We will arrange for you to follow-up with Dr. Chase Caller in our office in 6 months to compare the CT scans and to talk about any other necessary evaluation.

## 2018-09-13 LAB — ANTI-JO 1 ANTIBODY, IGG

## 2018-09-15 LAB — ANTI-SMITH ANTIBODY: ENA SM AB SER-ACNC: NEGATIVE AI

## 2018-09-15 LAB — RNP ANTIBODY: Ribonucleic Protein(ENA) Antibody, IgG: <1 AI

## 2018-09-15 LAB — SJOGREN'S SYNDROME ANTIBODS(SSA + SSB)
SSA (RO) (ENA) ANTIBODY, IGG: NEGATIVE AI
SSB (La) (ENA) Antibody, IgG: <1 AI

## 2018-09-15 LAB — ANTI-NUCLEAR AB-TITER (ANA TITER)

## 2018-09-15 LAB — CYCLIC CITRUL PEPTIDE ANTIBODY, IGG: Cyclic Citrullin Peptide Ab: <16 UNITS

## 2018-09-15 LAB — ANTI-SCLERODERMA ANTIBODY: Scleroderma (Scl-70) (ENA) Antibody, IgG: <1 AI

## 2018-09-15 LAB — RHEUMATOID FACTOR: Rhuematoid fact SerPl-aCnc: <14 IU/mL (ref <14)

## 2018-09-15 LAB — ALDOLASE: ALDOLASE: 3.8 U/L (ref <=8.1)

## 2018-09-15 LAB — ANA: Anti Nuclear Antibody(ANA): POSITIVE — AB

## 2018-09-15 LAB — ANTI-DNA ANTIBODY, DOUBLE-STRANDED

## 2018-09-19 ENCOUNTER — Ambulatory Visit (HOSPITAL_COMMUNITY)
Admission: RE | Admit: 2018-09-19 | Discharge: 2018-09-19 | Disposition: A | Discharge status: Home / Self Care | Payer: Medicare Other | Source: Ambulatory Visit | Attending: Nurse Practitioner | Admitting: Nurse Practitioner

## 2018-09-19 ENCOUNTER — Encounter (HOSPITAL_COMMUNITY): Payer: Self-pay | Admitting: Nurse Practitioner

## 2018-09-19 VITALS — BP 162/80 | HR 54 | Ht 59.0 in | Wt 115.0 lb

## 2018-09-19 DIAGNOSIS — Z8249 Family history of ischemic heart disease and other diseases of the circulatory system: Secondary | ICD-10-CM | POA: Insufficient documentation

## 2018-09-19 DIAGNOSIS — I480 Paroxysmal atrial fibrillation: Secondary | ICD-10-CM

## 2018-09-19 DIAGNOSIS — Z885 Allergy status to narcotic agent status: Secondary | ICD-10-CM | POA: Insufficient documentation

## 2018-09-19 DIAGNOSIS — I10 Essential (primary) hypertension: Secondary | ICD-10-CM | POA: Diagnosis not present

## 2018-09-19 DIAGNOSIS — Z79890 Hormone replacement therapy (postmenopausal): Secondary | ICD-10-CM | POA: Diagnosis not present

## 2018-09-19 DIAGNOSIS — Z79899 Other long term (current) drug therapy: Secondary | ICD-10-CM | POA: Diagnosis not present

## 2018-09-19 DIAGNOSIS — Z7901 Long term (current) use of anticoagulants: Secondary | ICD-10-CM | POA: Diagnosis not present

## 2018-09-19 DIAGNOSIS — Z888 Allergy status to other drugs, medicaments and biological substances status: Secondary | ICD-10-CM | POA: Insufficient documentation

## 2018-09-19 NOTE — Progress Notes (Signed)
Primary Care Physician: [name], MD Referring Physician:Dr. Charliegh Vasudevan Dominique [name] is a 84 y.o. female with a h/o paroxysmal atrial fibrillation that has been maintaining SR on amiodarone. It was recently stopped 9/11 2/2 to recent CT showing presence of interstitial  lung disease/amiodaorne toxicity. She continues in SR but is here to discuss options to stay in SR, now off amiodarone.  F/u afib clinic 11/11. She is still washing out amiodarone. She is enjoying SR. No awareness of any afib. Is  pending a CT of chest next week for f/u lung disease.   F/u in afib clinic 12/9. She remains in SR off amiodarone since 9/11. She continues in SR. Her son reports that the pulmonologist believes the insterstitial changes in the lungs looked improved by second CT. Mention of multivessel CAD. Pt currently denies any exertional symptoms but will forward the report and my note to Dr. Radford Pax.  Today, she denies symptoms of palpitations, chest pain, shortness of breath, orthopnea, PND, lower extremity edema, dizziness, presyncope, syncope, or neurologic sequela. The patient is tolerating medications without difficulties and is otherwise without complaint today.   Past Medical History:  Diagnosis Date  . Anemia   . Cancer ([place])    uterine  . Hypertension   . Hyponatremia   . Hypothyroidism   . LVH (left ventricular hypertrophy)   . PAF (paroxysmal atrial fibrillation) ([place])    a. initial dx 2012 in setting of acute illness. b. began to recur 09/2017; started on amiodarone 11/2017.  Marland Kitchen Shingles   . Thrombocytopenia ([place])    Past Surgical History:  Procedure Laterality Date  . ABDOMINAL HYSTERECTOMY    . JOINT REPLACEMENT Bilateral    knees    Current Outpatient Medications  Medication Sig Dispense Refill  . acetaminophen (TYLENOL) 500 MG tablet Take 500 mg by mouth every 6 (six) hours as needed.    Marland Kitchen apixaban (ELIQUIS) 2.5 MG TABS tablet Take 1 tablet (2.5 mg total) by mouth 2 (two)  times daily. 60 tablet 6  . levothyroxine (SYNTHROID, LEVOTHROID) 50 MCG tablet Take 50 mcg by mouth daily before breakfast.    . metoprolol tartrate (LOPRESSOR) 50 MG tablet Take 50 mg by mouth daily.    . simvastatin (ZOCOR) 20 MG tablet Take 20 mg by mouth daily.     No current facility-administered medications for this encounter.     Allergies  Allergen Reactions  . Codeine   . Darvocet [Propoxyphene N-Acetaminophen]   . Demerol   . Percocet [Oxycodone-Acetaminophen]   . Prednisone     Uncertain this is true. Was documented when patient was admitted 02/12/2012, but not recalled by either the patient nor her son. Tolerating it easily 08/2016.     Social History   Socioeconomic History  . Marital status: Widowed    Spouse name: 08/11/2014  . Number of children: Not on file  . Years of education: Not on file  . Highest education level: Not on file  Occupational History  . 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 Financial resource strain: Not on file  . Food insecurity:    Worry: Not on file    Inability: Not on file  . Transportation needs:    Medical: Not on file    Non-medical: Not on file  Tobacco Use  . Smoking status: Never Smoker  . Smokeless tobacco: Never Used  Substance and Sexual Activity  . Alcohol use: No  .  Drug use: No  . Sexual activity: Never  Lifestyle  . Physical activity:    Days per week: Not on file    Minutes per session: Not on file  . Stress: Not on file  Relationships  . Social connections:    Talks on phone: Not on file    Gets together: Not on file    Attends religious service: Not on file    Active member of club or organization: Not on file    Attends meetings of clubs or organizations: Not on file    Relationship status: Not on file  . Intimate partner violence:    Fear of current or ex partner: Not on file    Emotionally abused: Not on file    Physically abused: Not on file    Forced  sexual activity: Not on file  Other Topics Concern  . Not on file  Social History Narrative   Lives alone    Family History  Problem Relation Age of Onset  . Tuberculosis Father   . Alcoholism Brother   . Hypertension Son     ROS- All systems are reviewed and negative except as per the HPI above  Physical Exam: Vitals:   09/19/18 1142  BP: (!) 162/80  Pulse: (!) 54  Weight: 52.2 kg  Height: 4\' 11"  (1.499 m)   Wt Readings from Last 3 Encounters:  09/19/18 52.2 kg  09/12/18 50.8 kg  08/22/18 51.3 kg    Labs: Lab Results  Component Value Date   NA 135 11/29/2017   K 4.7 11/29/2017   CL 97 11/29/2017   CO2 23 11/29/2017   GLUCOSE 90 11/29/2017   BUN 13 11/29/2017   CREATININE 0.84 11/29/2017   CALCIUM 9.6 11/29/2017   MG 1.8 09/29/2017   Lab Results  Component Value Date   INR 1.29 02/16/2011   No results found for: CHOL, HDL, LDLCALC, TRIG   GEN- The patient is well appearing, alert and oriented x 3 today.   Head- normocephalic, atraumatic Eyes-  Sclera clear, conjunctiva pink Ears- hearing intact Oropharynx- clear Neck- supple, no JVP Lymph- no cervical lymphadenopathy Lungs- few crackles heard in the bases, normal work of breathing Heart- Regular rate and rhythm, no murmurs, rubs or gallops, PMI not laterally displaced GI- soft, NT, ND, + BS Extremities- no clubbing, cyanosis, or edema MS- no significant deformity or atrophy Skin- no rash or lesion Psych- euthymic mood, full affect Neuro- strength and sensation are intact  EKG- Sinus brady at 54 bpm, pr int 210 ms, qrs int 76 ms, qtc 474ms     Assessment and Plan: 1. Paroxysmal  afib In SR since amiodarone stopped Amiodarone stopped 9/11 for concerns of amiodarone toxicity by abnormal CT She had CT of chest repeated, additionally shows presence of multivessel diease, will forward to Dr. Radford Pax to see if any f/u is needed, so states that pulmonologist thought some improvement in  ILD Unfortunately, cannot start another antiarrythmic at this point until amiodarone washes out may take several  months Will get a repeat amiodarone level today Will bring back in one month at that point should be at 4 months off amiodarone  and may be able to start Tikosyn  if she should wish to purse  2. Chadsvasc score of 3 Continue eliquis 2.5 mg bid   F/u in one month, sooner if afib returns  Butch Penny C. Sundeep Destin, [facility] [address] [place], [place] [address] [phone]

## 2018-09-20 ENCOUNTER — Telehealth: Payer: Self-pay

## 2018-09-20 DIAGNOSIS — I2584 Coronary atherosclerosis due to calcified coronary lesion: Principal | ICD-10-CM

## 2018-09-20 DIAGNOSIS — R9389 Abnormal findings on diagnostic imaging of other specified body structures: Secondary | ICD-10-CM

## 2018-09-20 DIAGNOSIS — I2510 Atherosclerotic heart disease of native coronary artery without angina pectoris: Secondary | ICD-10-CM

## 2018-09-20 LAB — AMIODARONE LEVEL
Amiodarone Lvl: 0.5 ug/mL — ABNORMAL LOW (ref 1.0–2.5)
N-DESETHYL-AMIODARONE: 0.6 ug/mL — AB (ref 1.0–2.5)

## 2018-09-20 NOTE — Telephone Encounter (Signed)
Spoke with the patient, she expressed understanding about the [place]. Discussed instructions and will send letter to her home. The patient had no further questions.

## 2018-09-20 NOTE — Telephone Encounter (Signed)
-----   Message from Sueanne Margarita, MD sent at 09/19/2018  3:02 PM EST ----- Suezanne Jacquet  Patient had 3 vessel coronary calcifications on Chest CT.  Please set her up for Lexiscan myoview to rule out ischemia.   Traci ----- Message ----- From: Sherran Needs, NP Sent: 09/19/2018   1:11 PM EST To: Sueanne Margarita, MD

## 2018-09-23 ENCOUNTER — Encounter: Payer: Self-pay | Admitting: Cardiology

## 2018-10-06 ENCOUNTER — Telehealth: Payer: Self-pay

## 2018-10-06 NOTE — Telephone Encounter (Signed)
New message    Just an FYI. We have made several attempts to contact this patient including sending a letter to schedule or reschedule their echocardiogram. We will be removing the patient from the echo WQ.   Thank you 

## 2018-10-21 ENCOUNTER — Ambulatory Visit (HOSPITAL_COMMUNITY): Payer: Medicare Other | Admitting: Nurse Practitioner

## 2018-10-24 ENCOUNTER — Encounter (HOSPITAL_COMMUNITY): Payer: Self-pay | Admitting: Nurse Practitioner

## 2018-10-24 ENCOUNTER — Ambulatory Visit (HOSPITAL_COMMUNITY)
Admission: RE | Admit: 2018-10-24 | Discharge: 2018-10-24 | Disposition: A | Discharge status: Home / Self Care | Payer: Medicare Other | Source: Ambulatory Visit | Attending: Nurse Practitioner | Admitting: Nurse Practitioner

## 2018-10-24 VITALS — BP 122/70 | HR 62 | Ht 59.0 in | Wt 112.0 lb

## 2018-10-24 DIAGNOSIS — I517 Cardiomegaly: Secondary | ICD-10-CM | POA: Insufficient documentation

## 2018-10-24 DIAGNOSIS — E871 Hypo-osmolality and hyponatremia: Secondary | ICD-10-CM | POA: Insufficient documentation

## 2018-10-24 DIAGNOSIS — I480 Paroxysmal atrial fibrillation: Secondary | ICD-10-CM | POA: Insufficient documentation

## 2018-10-24 DIAGNOSIS — E039 Hypothyroidism, unspecified: Secondary | ICD-10-CM | POA: Insufficient documentation

## 2018-10-24 DIAGNOSIS — I10 Essential (primary) hypertension: Secondary | ICD-10-CM | POA: Diagnosis not present

## 2018-10-24 DIAGNOSIS — Z885 Allergy status to narcotic agent status: Secondary | ICD-10-CM | POA: Diagnosis not present

## 2018-10-24 DIAGNOSIS — J849 Interstitial pulmonary disease, unspecified: Secondary | ICD-10-CM | POA: Diagnosis not present

## 2018-10-24 DIAGNOSIS — Z79899 Other long term (current) drug therapy: Secondary | ICD-10-CM | POA: Insufficient documentation

## 2018-10-24 DIAGNOSIS — Z7901 Long term (current) use of anticoagulants: Secondary | ICD-10-CM | POA: Insufficient documentation

## 2018-10-24 DIAGNOSIS — Z79890 Hormone replacement therapy (postmenopausal): Secondary | ICD-10-CM | POA: Diagnosis not present

## 2018-10-24 DIAGNOSIS — Z888 Allergy status to other drugs, medicaments and biological substances status: Secondary | ICD-10-CM | POA: Diagnosis not present

## 2018-10-24 NOTE — Progress Notes (Signed)
Primary Care Physician: [name], MD Referring Physician:Dr. Anayeli Arel [name] is a 85 y.o. female with a h/o paroxysmal atrial fibrillation that has been maintaining SR on amiodarone. It was recently stopped 9/11 2/2 to recent CT showing presence of interstitial  lung disease/amiodaorne toxicity. She continues in SR but is here to discuss options to stay in SR, now off amiodarone.  F/u afib clinic 11/11. She is still washing out amiodarone. She is enjoying SR. No awareness of any afib. Is  pending a CT of chest next week for f/u lung disease.   F/u in afib clinic 12/9. She remains in SR off amiodarone since 9/11. She continues in SR. Her son reports that the pulmonologist believes the insterstitial changes in the lungs looked improved by second CT. Mention of multivessel CAD. Pt currently denies any exertional symptoms but will forward the report and my note to Dr. Radford Pax.  F/u in afib clinic 10/24/2018. She remains in SR, she now has been off amiodarone greater than 3 months(now at  4 months so would be able to proceed for tikosyn ) . Several phone calls in chart to get pt scheduled for echo/lexi Myoview which pt has failed to return phone calls to get set up. The son says that it would be best to contact him about such things as he is her transportation.  Today, she denies symptoms of palpitations, chest pain, shortness of breath, orthopnea, PND, lower extremity edema, dizziness, presyncope, syncope, or neurologic sequela. The patient is tolerating medications without difficulties and is otherwise without complaint today.   Past Medical History:  Diagnosis Date  . Anemia   . Cancer ([place])    uterine  . Hypertension   . Hyponatremia   . Hypothyroidism   . LVH (left ventricular hypertrophy)   . PAF (paroxysmal atrial fibrillation) ([place])    a. initial dx 2012 in setting of acute illness. b. began to recur 09/2017; started on amiodarone 11/2017.  Marland Kitchen Shingles   . Thrombocytopenia  ([place])    Past Surgical History:  Procedure Laterality Date  . ABDOMINAL HYSTERECTOMY    . JOINT REPLACEMENT Bilateral    knees    Current Outpatient Medications  Medication Sig Dispense Refill  . acetaminophen (TYLENOL) 500 MG tablet Take 500 mg by mouth every 6 (six) hours as needed.    Marland Kitchen apixaban (ELIQUIS) 2.5 MG TABS tablet Take 1 tablet (2.5 mg total) by mouth 2 (two) times daily. 60 tablet 6  . levothyroxine (SYNTHROID, LEVOTHROID) 50 MCG tablet Take 50 mcg by mouth daily before breakfast.    . metoprolol tartrate (LOPRESSOR) 50 MG tablet Take 50 mg by mouth daily.    . simvastatin (ZOCOR) 20 MG tablet Take 20 mg by mouth daily.     No current facility-administered medications for this encounter.     Allergies  Allergen Reactions  . Codeine   . Darvocet [Propoxyphene N-Acetaminophen]   . Demerol   . Percocet [Oxycodone-Acetaminophen]   . Prednisone     Uncertain this is true. Was documented when patient was admitted 02/12/2012, but not recalled by either the patient nor her son. Tolerating it easily 08/2016.     Social History   Socioeconomic History  . Marital status: Widowed    Spouse name: 08/11/2014  . Number of children: Not on file  . Years of education: Not on file  . Highest education level: Not on file  Occupational History  . Occupation: retired Theme park manager    Comment:  worked x 45 yrs  Social Needs  . Financial resource strain: Not on file  . Food insecurity:    Worry: Not on file    Inability: Not on file  . Transportation needs:    Medical: Not on file    Non-medical: Not on file  Tobacco Use  . Smoking status: Never Smoker  . Smokeless tobacco: Never Used  Substance and Sexual Activity  . Alcohol use: No  . Drug use: No  . Sexual activity: Never  Lifestyle  . Physical activity:    Days per week: Not on file    Minutes per session: Not on file  . Stress: Not on file  Relationships  . Social connections:    Talks on phone: Not on file     Gets together: Not on file    Attends religious service: Not on file    Active member of club or organization: Not on file    Attends meetings of clubs or organizations: Not on file    Relationship status: Not on file  . Intimate partner violence:    Fear of current or ex partner: Not on file    Emotionally abused: Not on file    Physically abused: Not on file    Forced sexual activity: Not on file  Other Topics Concern  . Not on file  Social History Narrative   Lives alone    Family History  Problem Relation Age of Onset  . Tuberculosis Father   . Alcoholism Brother   . Hypertension Son     ROS- All systems are reviewed and negative except as per the HPI above  Physical Exam: Vitals:   10/24/18 1154  BP: 122/70  Pulse: 62  Weight: 50.8 kg  Height: 4\' 11"  (1.499 m)   Wt Readings from Last 3 Encounters:  10/24/18 50.8 kg  09/19/18 52.2 kg  09/12/18 50.8 kg    Labs: Lab Results  Component Value Date   NA 135 11/29/2017   K 4.7 11/29/2017   CL 97 11/29/2017   CO2 23 11/29/2017   GLUCOSE 90 11/29/2017   BUN 13 11/29/2017   CREATININE 0.84 11/29/2017   CALCIUM 9.6 11/29/2017   MG 1.8 09/29/2017   Lab Results  Component Value Date   INR 1.29 02/16/2011   No results found for: CHOL, HDL, LDLCALC, TRIG   GEN- The patient is well appearing, alert and oriented x 3 today.   Head- normocephalic, atraumatic Eyes-  Sclera clear, conjunctiva pink Ears- hearing intact Oropharynx- clear Neck- supple, no JVP Lymph- no cervical lymphadenopathy Lungs- few crackles heard in the bases, normal work of breathing Heart- slow rate and rhythm, no murmurs, rubs or gallops, PMI not laterally displaced GI- soft, NT, ND, + BS Extremities- no clubbing, cyanosis, or edema MS- no significant deformity or atrophy Skin- no rash or lesion Psych- euthymic mood, full affect Neuro- strength and sensation are intact  EKG- Sinus brady at 62 bpm, pr int 198 ms, qrs int 80 ms, qtc 460  ms     Assessment and Plan: 1. Paroxysmal  afib In SR since amiodarone stopped Amiodarone stopped 9/11 for concerns of amiodarone toxicity by abnormal CT She had CT of chest repeated, additionally shows presence of multivessel diease, Dr.Turner would like pt to have lexi myoview, has not been scheduled Son will see that echo and lexi are scheduled  Discussed Tikosyn in detail today Will await the results of the tests to further discuss admission for [facility]  2. Chadsvasc score of 3 Continue eliquis 2.5 mg bid  F/u in one month, sooner if afib returns  Butch Penny C. Christion Leonhard, [facility] [address] [place], [place] [address] [phone]

## 2018-11-07 ENCOUNTER — Telehealth (HOSPITAL_COMMUNITY): Payer: Self-pay | Admitting: *Deleted

## 2018-11-07 NOTE — Telephone Encounter (Signed)
Pt son calling to find out if the patients lexiscan is going to be scheduled. Schedulers had been trying to contact the patient and not the pts son

## 2018-11-09 ENCOUNTER — Other Ambulatory Visit (HOSPITAL_COMMUNITY): Payer: Self-pay | Admitting: Nurse Practitioner

## 2018-11-16 ENCOUNTER — Telehealth (HOSPITAL_COMMUNITY): Payer: Self-pay | Admitting: *Deleted

## 2018-11-16 NOTE — Telephone Encounter (Signed)
Patient's son. Per dpr, given detailed instructions per Myocardial Perfusion Study Information Sheet for the test on 11/18/18. Patient notified to arrive 15 minutes early and that it is imperative to arrive on time for appointment to keep from having the test rescheduled.  If you need to cancel or reschedule your appointment, please call the office within 24 hours of your appointment. . Patient verbalized understanding. Kirstie Peri

## 2018-11-18 ENCOUNTER — Encounter (HOSPITAL_COMMUNITY): Payer: Medicare Other

## 2018-12-16 IMAGING — CT CT HEAD W/O CM
3 series · 15 of 47 positions shown, 18 images · non-contrast
Comparison: None.

CLINICAL DATA: 84-year-old female with unwitnessed fall at home.
Ataxia. Weakness and numbness.

EXAM:
CT HEAD WITHOUT CONTRAST
TECHNIQUE: Contiguous axial images were obtained from the base of the skull
through the vertex without intravenous contrast.

[Series 2: head wo · axial · 0.47mm/px · z∈[-86,+39]mm · 9 of 31 slices shown, 12 images]
[im 3/31  brain]
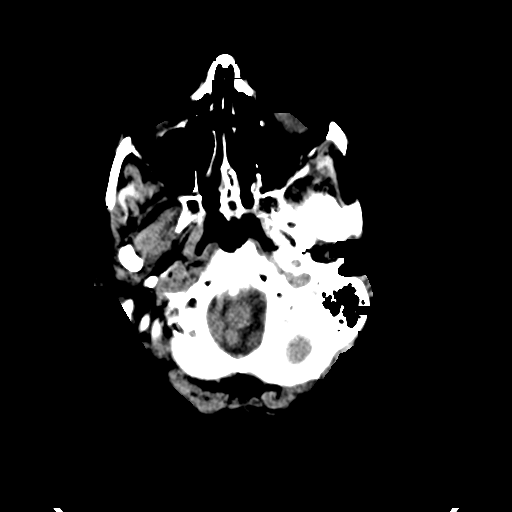
[im 3/31  bone]
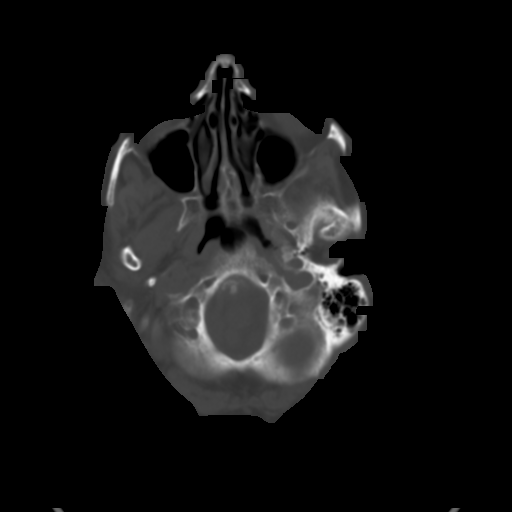
[im 6/31  brain]
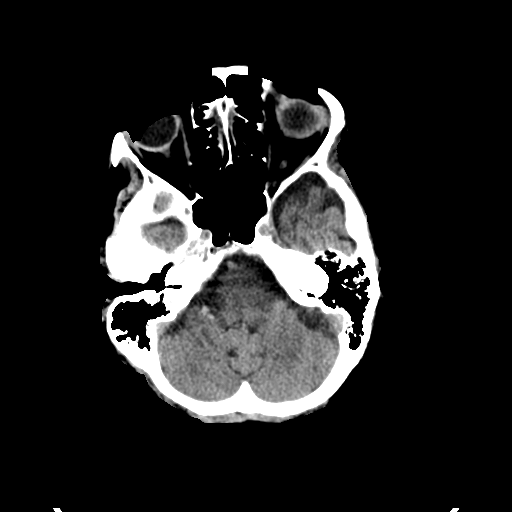
[im 9/31  brain]
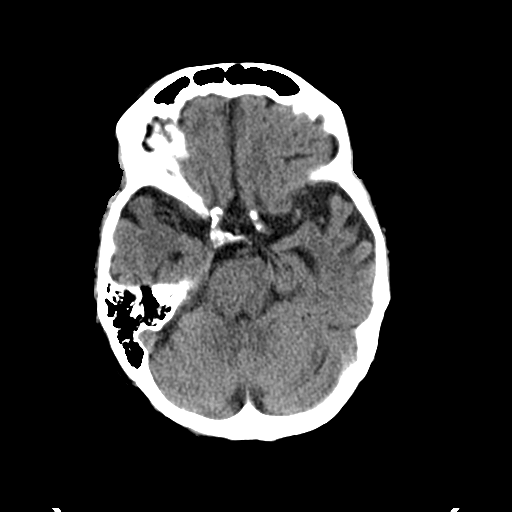
[im 12/31  brain]
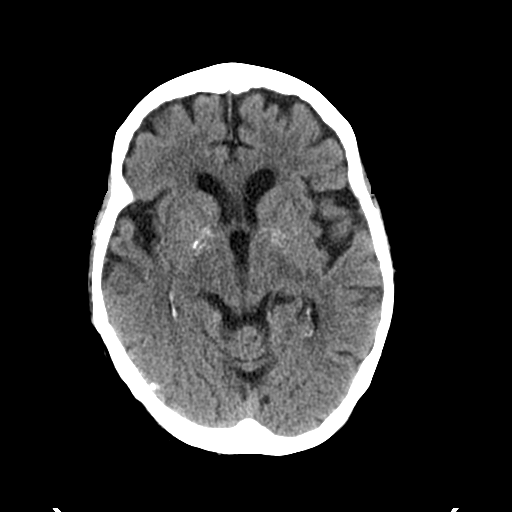
[im 16/31  brain]
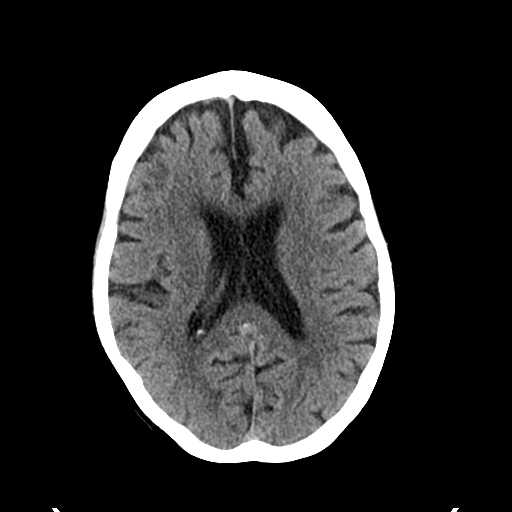
[im 16/31  bone]
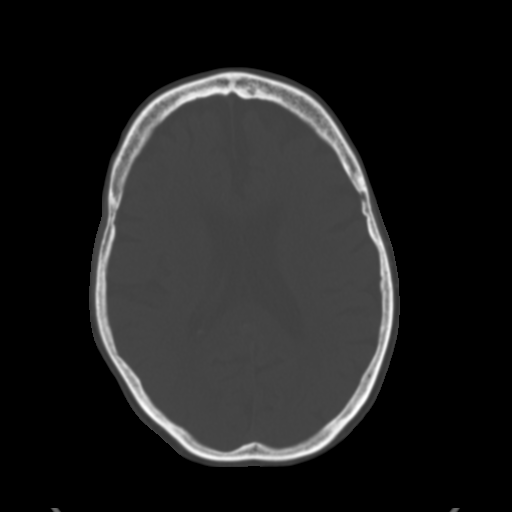
[im 19/31  brain]
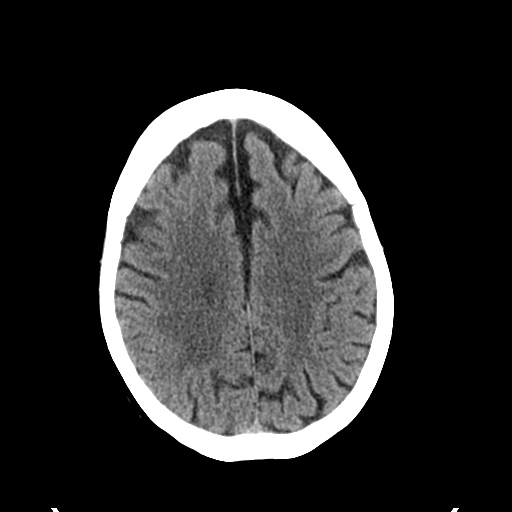
[im 22/31  brain]
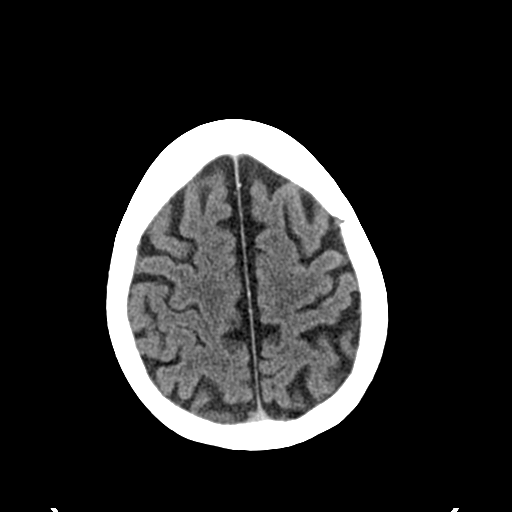
[im 25/31  brain]
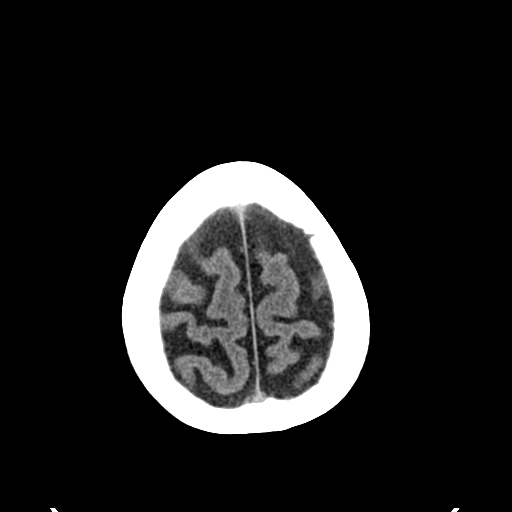
[im 28/31  brain]
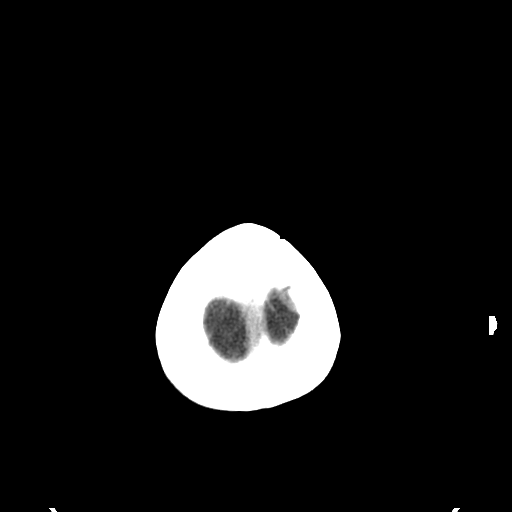
[im 28/31  bone]
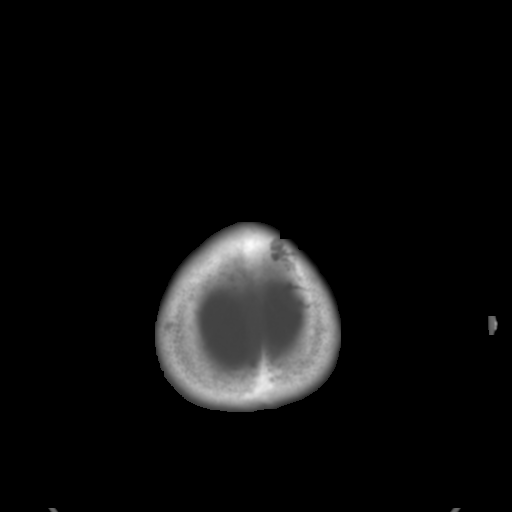

[Series 5: coronal soft tissue · coronal · 0.31mm/px · 3 of 67 slices shown]
[im 23/67  brain]
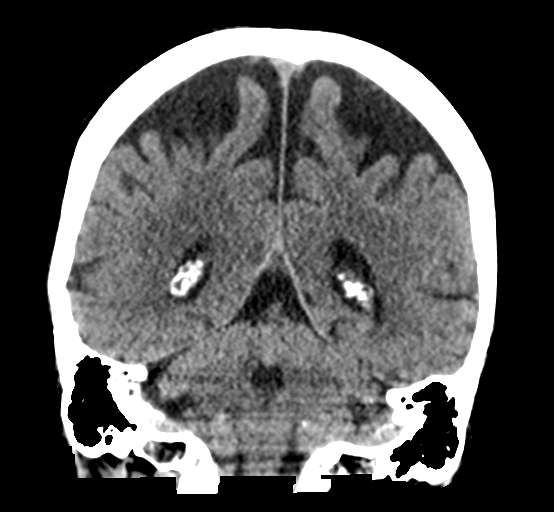
[im 30/67  brain]
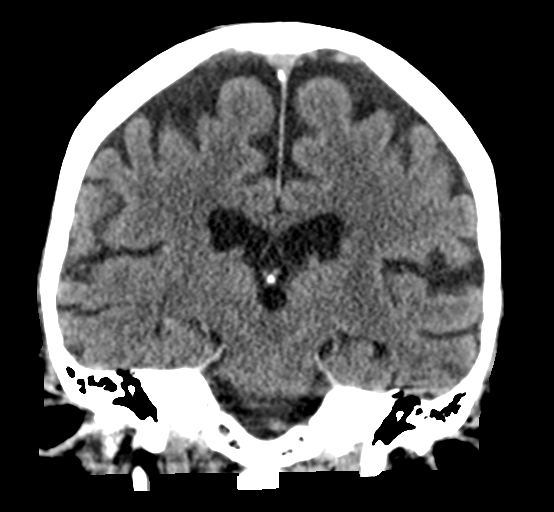
[im 37/67  brain]
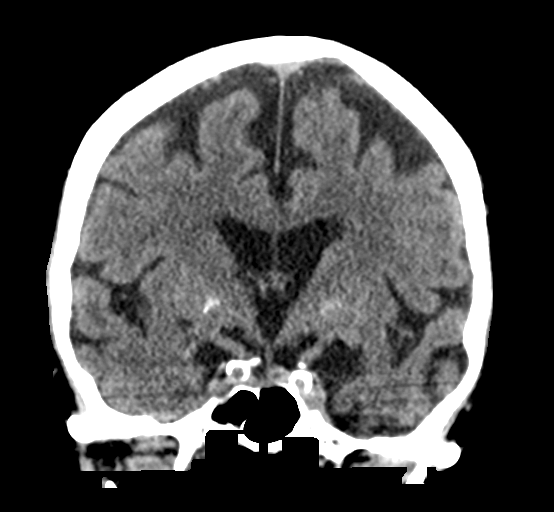

[Series 6: sagittal soft tissue · sagittal · 0.30mm/px · 3 of 56 slices shown]
[im 19/56  brain]
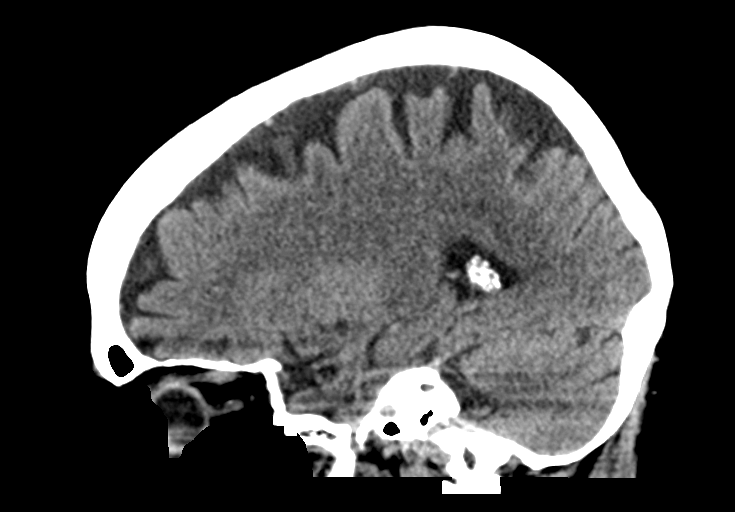
[im 28/56  brain]
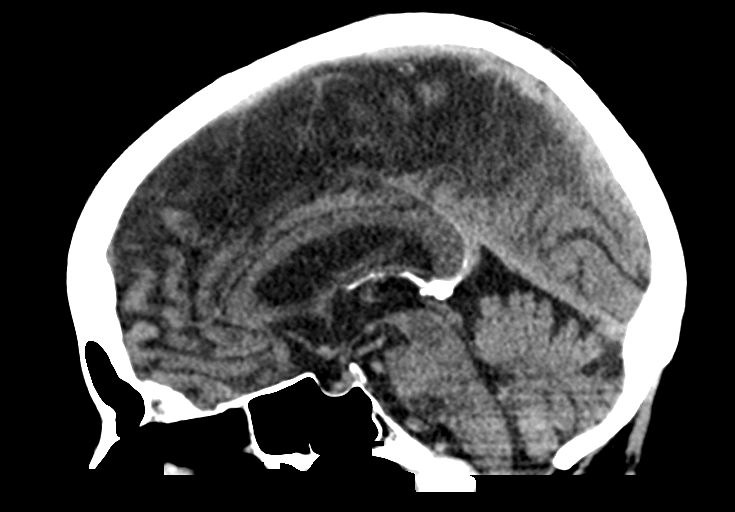
[im 37/56  brain]
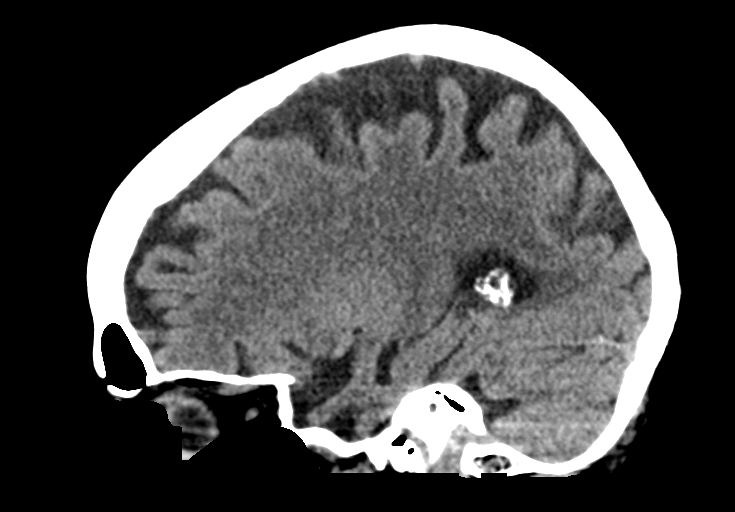

[15 of 47 positions shown; findings below may reference images not displayed]

FINDINGS: Brain: Cerebral volume is within normal limits for age. Mild
dystrophic bilateral basal ganglia calcifications. Largely normal
for age gray-white matter differentiation throughout the brain;
minimal white matter hypodensity. No midline shift,
ventriculomegaly, mass effect, evidence of mass lesion, intracranial
hemorrhage or evidence of cortically based acute infarction. No
cortical encephalomalacia identified.

Vascular: Calcified atherosclerosis at the skull base. No suspicious
intracranial vascular hyperdensity.

Skull: Osteopenia.  No acute osseous abnormality identified.

Sinuses/Orbits: Clear.

Other: No acute orbit or scalp soft tissue findings.
IMPRESSION: No acute intracranial abnormality. Unremarkable for age non contrast
CT appearance of the brain.

## 2018-12-16 IMAGING — CR DG HIP (WITH OR WITHOUT PELVIS) 3-4V BILAT
5 series · 5 of 5 positions shown · non-contrast
Comparison: None.

CLINICAL DATA: Bilateral hip pain after fall.

EXAM:
DG HIP (WITH OR WITHOUT PELVIS) 3-4V BILAT

[t pelvis ap]
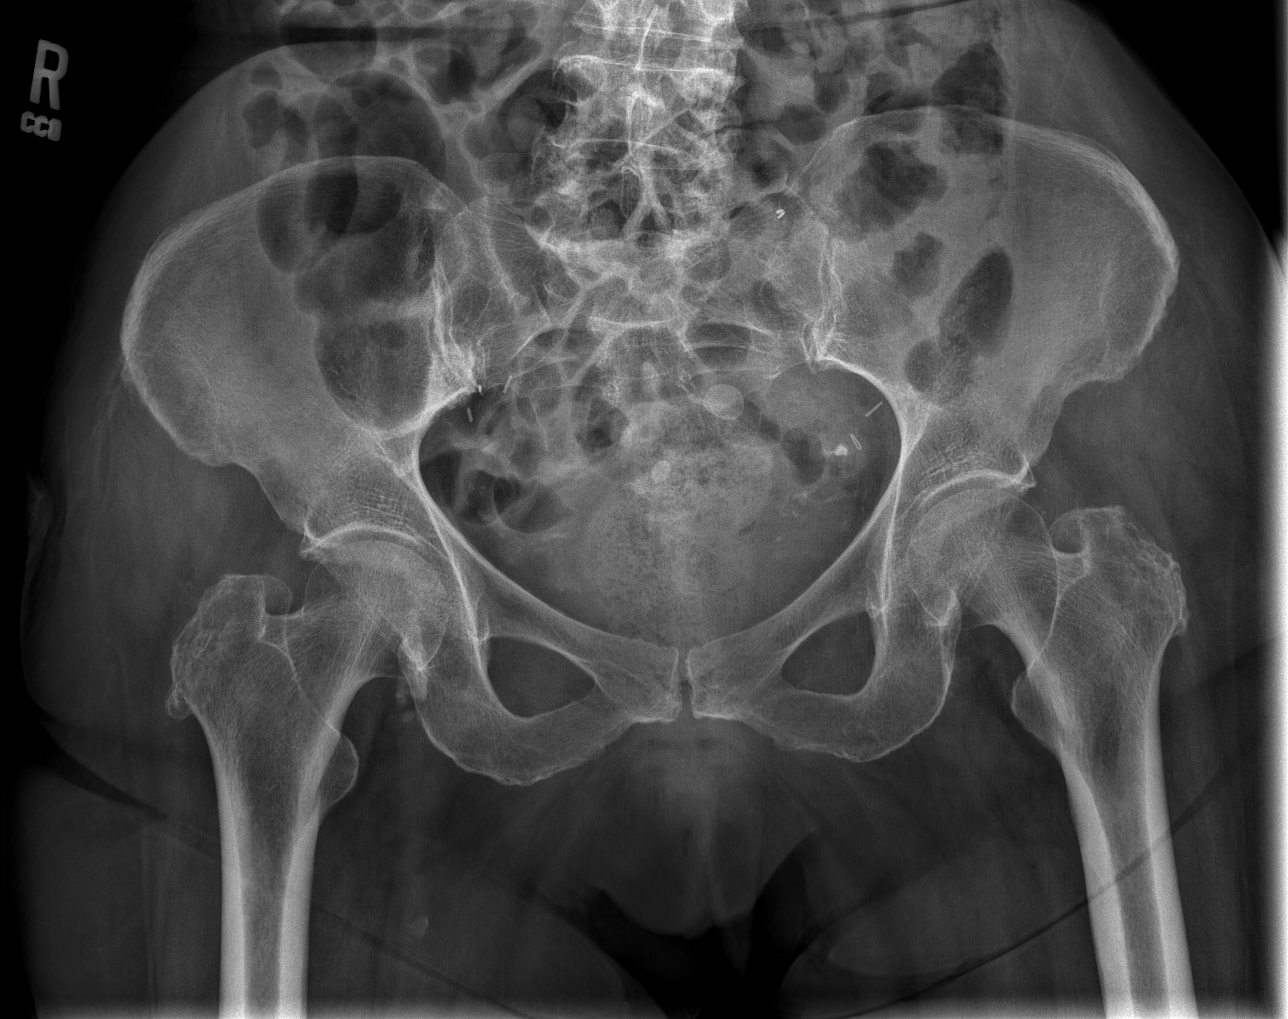

[t hip ap left]
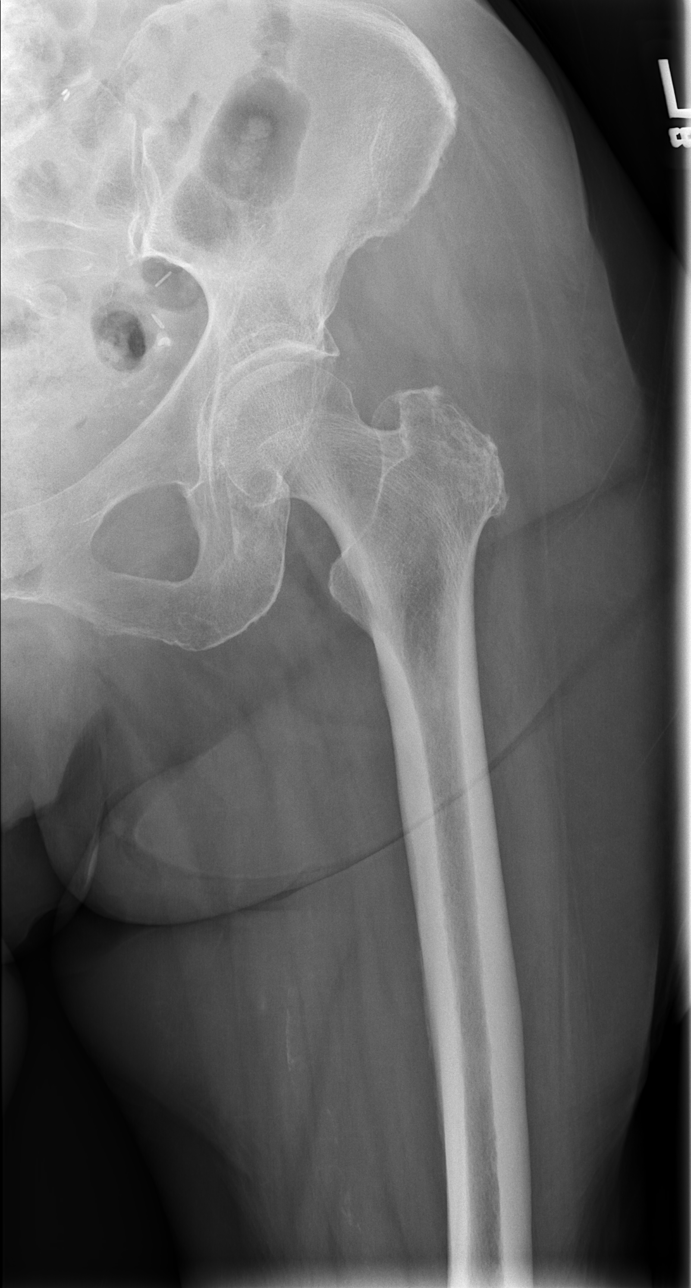

[t hip ap right]
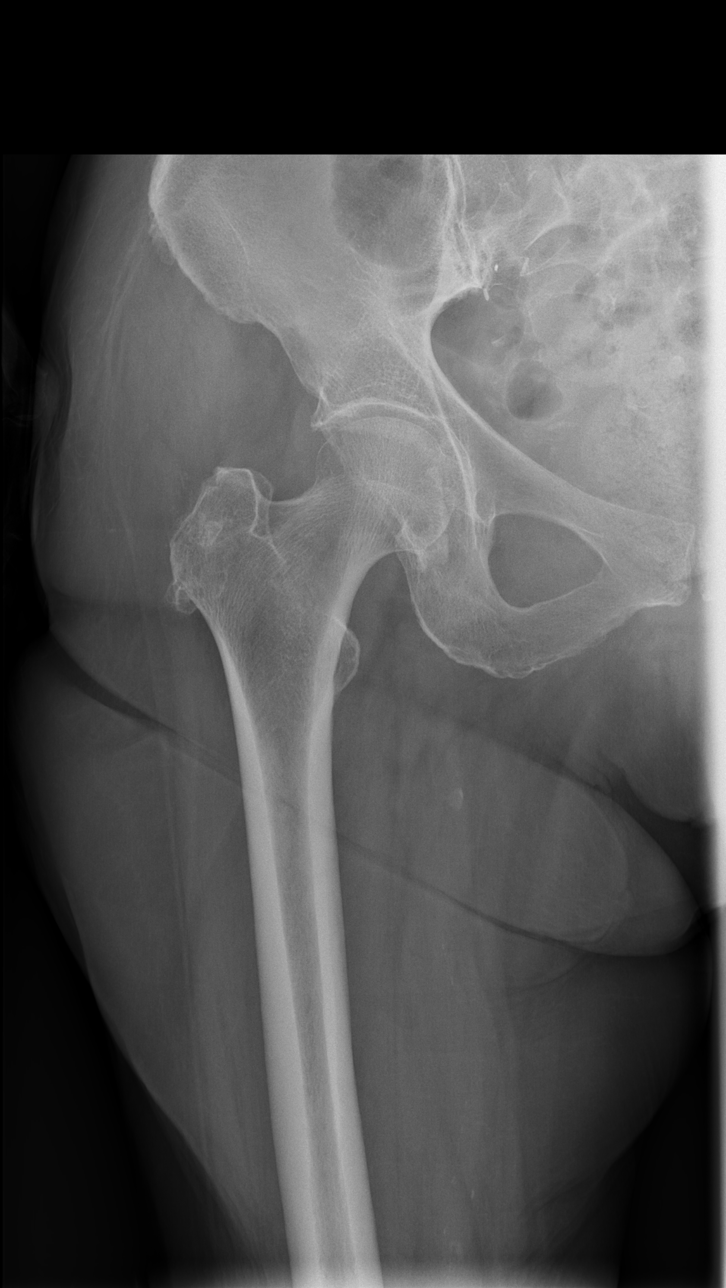

[t hip frog leg right]
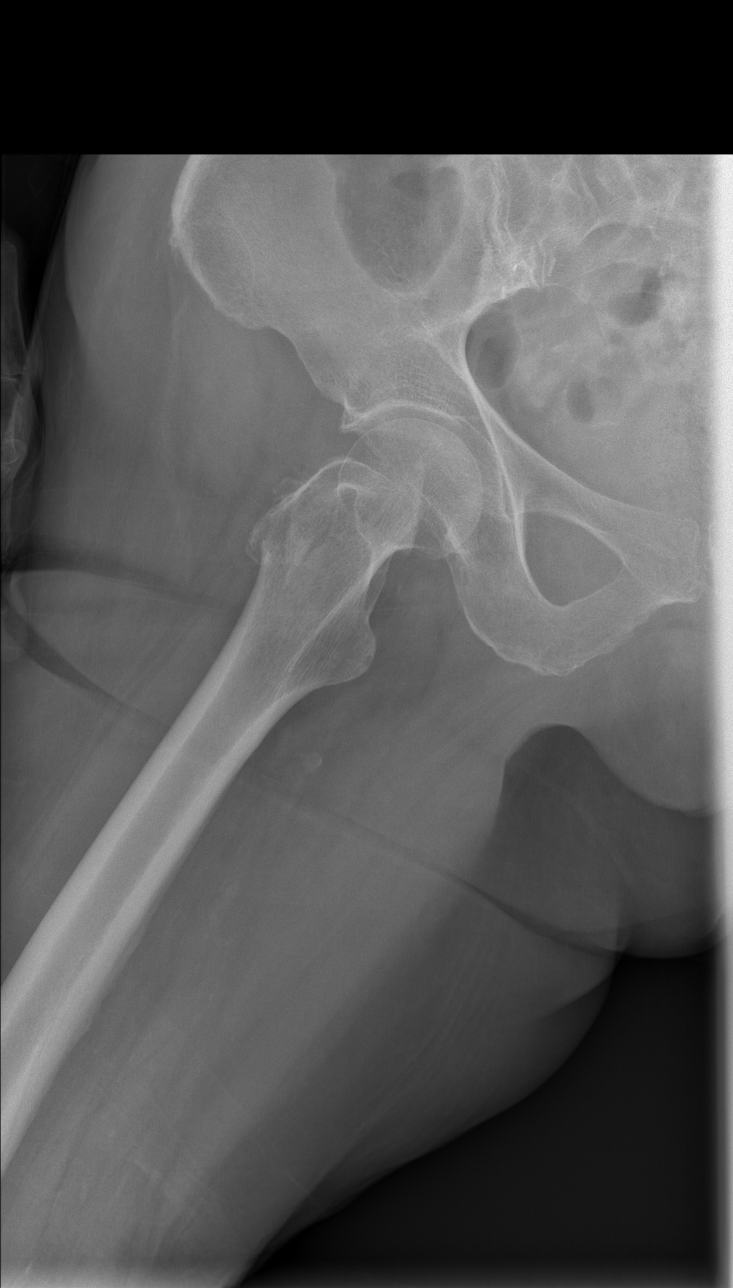

[t hip frog leg left]
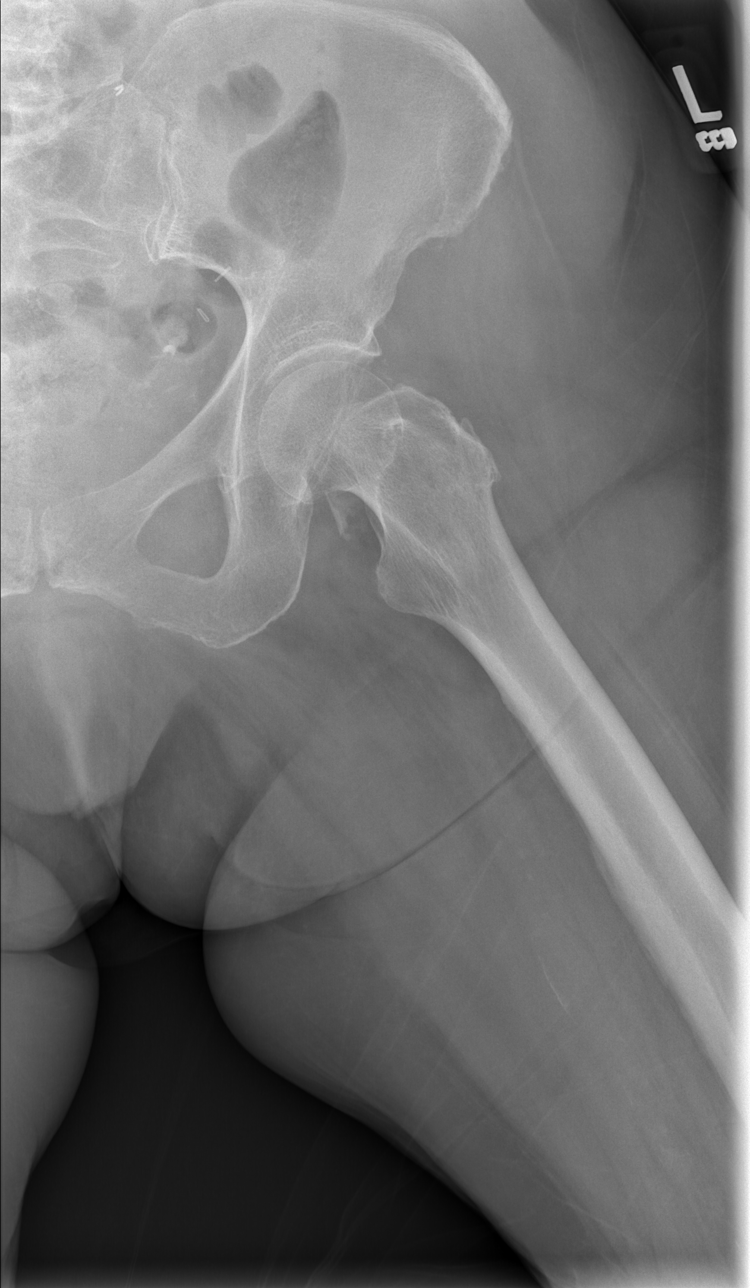

[5 of 5 positions shown; findings below may reference images not displayed]

FINDINGS: There is no evidence of hip fracture or dislocation. There is no
evidence of arthropathy or other focal bone abnormality.
IMPRESSION: Normal bilateral hips.

## 2019-01-25 ENCOUNTER — Inpatient Hospital Stay (HOSPITAL_COMMUNITY)
Admission: EM | Admit: 2019-01-25 | Discharge: 2019-01-30 | DRG: 689 | Disposition: A | Discharge status: Skilled Nursing Facility | Payer: Medicare Other | Attending: Internal Medicine | Admitting: Internal Medicine

## 2019-01-25 ENCOUNTER — Emergency Department (HOSPITAL_COMMUNITY): Payer: Medicare Other

## 2019-01-25 DIAGNOSIS — G9340 Encephalopathy, unspecified: Secondary | ICD-10-CM | POA: Diagnosis present

## 2019-01-25 DIAGNOSIS — B9620 Unspecified Escherichia coli [E. coli] as the cause of diseases classified elsewhere: Secondary | ICD-10-CM | POA: Diagnosis present

## 2019-01-25 DIAGNOSIS — I10 Essential (primary) hypertension: Secondary | ICD-10-CM | POA: Diagnosis present

## 2019-01-25 DIAGNOSIS — S40011A Contusion of right shoulder, initial encounter: Secondary | ICD-10-CM | POA: Diagnosis present

## 2019-01-25 DIAGNOSIS — F0390 Unspecified dementia without behavioral disturbance: Secondary | ICD-10-CM | POA: Diagnosis present

## 2019-01-25 DIAGNOSIS — E039 Hypothyroidism, unspecified: Secondary | ICD-10-CM | POA: Diagnosis present

## 2019-01-25 DIAGNOSIS — Z96653 Presence of artificial knee joint, bilateral: Secondary | ICD-10-CM | POA: Diagnosis present

## 2019-01-25 DIAGNOSIS — N3001 Acute cystitis with hematuria: Secondary | ICD-10-CM

## 2019-01-25 DIAGNOSIS — E876 Hypokalemia: Secondary | ICD-10-CM | POA: Clinically undetermined

## 2019-01-25 DIAGNOSIS — G9341 Metabolic encephalopathy: Secondary | ICD-10-CM | POA: Diagnosis present

## 2019-01-25 DIAGNOSIS — E785 Hyperlipidemia, unspecified: Secondary | ICD-10-CM | POA: Diagnosis present

## 2019-01-25 DIAGNOSIS — I480 Paroxysmal atrial fibrillation: Secondary | ICD-10-CM | POA: Diagnosis present

## 2019-01-25 DIAGNOSIS — Z90710 Acquired absence of both cervix and uterus: Secondary | ICD-10-CM

## 2019-01-25 DIAGNOSIS — R29898 Other symptoms and signs involving the musculoskeletal system: Secondary | ICD-10-CM | POA: Diagnosis present

## 2019-01-25 DIAGNOSIS — N390 Urinary tract infection, site not specified: Secondary | ICD-10-CM | POA: Diagnosis not present

## 2019-01-25 DIAGNOSIS — E860 Dehydration: Secondary | ICD-10-CM | POA: Diagnosis present

## 2019-01-25 DIAGNOSIS — Y92009 Unspecified place in unspecified non-institutional (private) residence as the place of occurrence of the external cause: Secondary | ICD-10-CM

## 2019-01-25 DIAGNOSIS — Z7901 Long term (current) use of anticoagulants: Secondary | ICD-10-CM

## 2019-01-25 DIAGNOSIS — Z8542 Personal history of malignant neoplasm of other parts of uterus: Secondary | ICD-10-CM

## 2019-01-25 DIAGNOSIS — E871 Hypo-osmolality and hyponatremia: Secondary | ICD-10-CM | POA: Diagnosis present

## 2019-01-25 DIAGNOSIS — L89159 Pressure ulcer of sacral region, unspecified stage: Secondary | ICD-10-CM | POA: Diagnosis present

## 2019-01-25 DIAGNOSIS — S0012XA Contusion of left eyelid and periocular area, initial encounter: Secondary | ICD-10-CM | POA: Diagnosis present

## 2019-01-25 DIAGNOSIS — Z79890 Hormone replacement therapy (postmenopausal): Secondary | ICD-10-CM

## 2019-01-25 DIAGNOSIS — R0902 Hypoxemia: Secondary | ICD-10-CM

## 2019-01-25 DIAGNOSIS — Z9181 History of falling: Secondary | ICD-10-CM

## 2019-01-25 DIAGNOSIS — R296 Repeated falls: Secondary | ICD-10-CM | POA: Diagnosis present

## 2019-01-25 DIAGNOSIS — R4182 Altered mental status, unspecified: Secondary | ICD-10-CM | POA: Diagnosis not present

## 2019-01-25 DIAGNOSIS — S31000A Unspecified open wound of lower back and pelvis without penetration into retroperitoneum, initial encounter: Secondary | ICD-10-CM

## 2019-01-25 DIAGNOSIS — Z79899 Other long term (current) drug therapy: Secondary | ICD-10-CM

## 2019-01-25 DIAGNOSIS — W19XXXA Unspecified fall, initial encounter: Secondary | ICD-10-CM | POA: Diagnosis present

## 2019-01-25 DIAGNOSIS — L8990 Pressure ulcer of unspecified site, unspecified stage: Secondary | ICD-10-CM

## 2019-01-25 DIAGNOSIS — N179 Acute kidney failure, unspecified: Secondary | ICD-10-CM | POA: Diagnosis present

## 2019-01-25 DIAGNOSIS — R410 Disorientation, unspecified: Secondary | ICD-10-CM

## 2019-01-25 LAB — BASIC METABOLIC PANEL
Anion gap: 14 (ref 5–15)
BUN: 33 mg/dL — ABNORMAL HIGH (ref 8–23)
CO2: 24 mmol/L (ref 22–32)
Calcium: 9.4 mg/dL (ref 8.9–10.3)
Chloride: 94 mmol/L — ABNORMAL LOW (ref 98–111)
Creatinine, Ser: 1.1 mg/dL — ABNORMAL HIGH (ref 0.44–1.00)
GFR calc Af Amer: 54 mL/min — ABNORMAL LOW (ref >60)
GFR calc non Af Amer: 46 mL/min — ABNORMAL LOW (ref >60)
Glucose, Bld: 115 mg/dL — ABNORMAL HIGH (ref 70–99)
Potassium: 3.4 mmol/L — ABNORMAL LOW (ref 3.5–5.1)
Sodium: 132 mmol/L — ABNORMAL LOW (ref 135–145)

## 2019-01-25 LAB — CBC
HCT: 34 % — ABNORMAL LOW (ref 36.0–46.0)
Hemoglobin: 11.3 g/dL — ABNORMAL LOW (ref 12.0–15.0)
MCH: 31.9 pg (ref 26.0–34.0)
MCHC: 33.2 g/dL (ref 30.0–36.0)
MCV: 96 fL (ref 80.0–100.0)
Platelets: 587 10*3/uL — ABNORMAL HIGH (ref 150–400)
RBC: 3.54 MIL/uL — ABNORMAL LOW (ref 3.87–5.11)
RDW: 13.6 % (ref 11.5–15.5)
WBC: 22.3 10*3/uL — ABNORMAL HIGH (ref 4.0–10.5)
nRBC: 0 % (ref 0.0–0.2)

## 2019-01-25 MED ORDER — SODIUM CHLORIDE 0.9% FLUSH
3.0000 mL | Freq: Once | INTRAVENOUS | Status: AC
  Administered 2019-01-26: 3 mL via INTRAVENOUS

## 2019-01-25 NOTE — ED Provider Notes (Signed)
[facility] EMERGENCY DEPARTMENT Provider Note   CSN: [id number] Arrival date & time: 01/25/19  2001    History   Chief Complaint Chief Complaint  Patient presents with  . Fall    HPI Dominique [name] is a 85 y.o. female with a history of uterine cancer, HTN, atrial fibrillation on Eliquis, and hypothyroidism who presents to the emergency department with a chief complaint of fall.  The patient is oriented to self, year.  She is able to state the name of the president.  She is not oriented to place or month.  Spoke with the patient's son, [name], who reports that he attempted to call his mother at 8 earlier today.  He reports that he called numerous times and after not receiving an answer he went over to her house at approximately 1800 and found her laying on the floor next to her bed.  He is unsure of when she fell because when he asked her she initially said she fell at 8 AM, but since that she fell at 8 PM the night before, and when asked a third time she said that she fell 4 hours ago.  When I asked the patient in the ER, she reports that she fell 15 minutes ago is unable to provide any details about the fall.  She has a bruise around her left eye, to the palm of her left hand, on her left knee and lower leg.  There is also a wound on her sacrum that appears chronic.  She endorses generalized weakness and generalized pain throughout her entire body.  The patient reports to EMS that she has had 3 falls in the last few days.  The patient's son reports that her most recent fall before today was over 1 month ago.  He reports he also has a history of chronic pain in 1 of her shoulders and to 1 of her ankles.  He cannot recall the side of the pain.  He reports that she was previously going to PT for the symptoms and they seem to be improving, but she stopped going.  He reports that he purchased a cane for her several months ago, but she has not been using it at home.  The patient  reports that she lives at home with her husband.  However, when I asked the patient's son he reports that the patient's husband passed away several years ago.  He reports that the patient became very confused just after Christmas and called him crying and stating that she was unable to find her husband.  He reports that he took her to his PCP and she was started on donezepil as there was suspected to be some underlying dementia, but she has not further been worked up for these complaints.  He reports that she has had worsening confusion after previous falls.  He reports that the number of falls that she has had has significantly increased over the last year.  Reports that paramedics were able to assist the patient with standing at her home prior to arrival.  He reports that she performs all activities of daily living independently, but he lives locally and [place] and checks on her often.  Level 5 caveat secondary to altered mental status.     The history is provided by the patient. No language interpreter was used.    Past Medical History:  Diagnosis Date  . Anemia   . Cancer ([place])    uterine  . Hypertension   .  Hyponatremia   . Hypothyroidism   . LVH (left ventricular hypertrophy)   . PAF (paroxysmal atrial fibrillation) ([place])    a. initial dx 2012 in setting of acute illness. b. began to recur 09/2017; started on amiodarone 11/2017.  Marland Kitchen Shingles   . Thrombocytopenia [facility])     Patient Active Problem List   Diagnosis Date Noted  . Acute encephalopathy 01/26/2019  . Falls 01/26/2019  . Abnormal CT of the chest 07/14/2018  . 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 PAF (paroxysmal atrial fibrillation) ([place]) 11/26/2017  . Atrial fibrillation with RVR ([place])   . Essential hypertension   . History of uterine cancer 09/02/2016  . Benign essential HTN 09/02/2016  . Hypothyroidism 09/02/2016  . History of shingles 09/02/2016  . History of iron deficiency anemia 09/02/2016  .  History of thrombocytopenia 09/02/2016    Past Surgical History:  Procedure Laterality Date  . ABDOMINAL HYSTERECTOMY    . JOINT REPLACEMENT Bilateral    knees     OB History   No obstetric history on file.      Home Medications    Prior to Admission medications   Medication Sig Start Date End Date Taking? Authorizing Provider  acetaminophen (TYLENOL) 500 MG tablet Take 500 mg by mouth every 6 (six) hours as needed for mild pain or headache.     [provider]  ELIQUIS 2.5 MG TABS tablet TAKE 1 TABLET BY MOUTH TWICE DAILY. 11/09/18   Sherran Needs, NP  levothyroxine (SYNTHROID, LEVOTHROID) 50 MCG tablet Take 50 mcg by mouth daily before breakfast.    [provider]  metoprolol tartrate (LOPRESSOR) 50 MG tablet Take 50 mg by mouth daily.    [provider]  simvastatin (ZOCOR) 20 MG tablet Take 20 mg by mouth daily.    [provider]    Family History Family History  Problem Relation Age of Onset  . Tuberculosis Father   . Alcoholism Brother   . Hypertension Son     Social History Social History   Tobacco Use  . Smoking status: Never Smoker  . Smokeless tobacco: Never Used  Substance Use Topics  . Alcohol use: No  . Drug use: No     Allergies   Codeine; Darvocet [propoxyphene n-acetaminophen]; Demerol; Percocet [oxycodone-acetaminophen]; and Prednisone   Review of Systems Review of Systems  Unable to perform ROS: Dementia  Musculoskeletal: Positive for arthralgias and myalgias.  Skin: Positive for color change and wound.  Hematological: Bruises/bleeds easily.     Physical Exam Updated Vital Signs BP (!) 207/89 (BP Location: Right Arm)   Pulse 70   Temp 98.2 F (36.8 C)   Resp 16   SpO2 98%   Physical Exam Vitals signs and nursing note reviewed.  Constitutional:      General: She is not in acute distress.    Appearance: She is not ill-appearing, toxic-appearing or diaphoretic.     Comments: Cachectic  elderly female  HENT:     Head: Normocephalic.     Right Ear: Ear canal and external ear normal.     Left Ear: Tympanic membrane, ear canal and external ear normal.     Nose: Nose normal.     Mouth/Throat:     Mouth: Mucous membranes are dry.     Comments: Lips are dry and appear very cracked. Eyes:     General: No scleral icterus.       Right eye: No discharge.  Left eye: No discharge.     Extraocular Movements: Extraocular movements intact.     Conjunctiva/sclera: Conjunctivae normal.     Pupils: Pupils are equal, round, and reactive to light.  Neck:     Musculoskeletal: Normal range of motion and neck supple. No neck rigidity or muscular tenderness.     Vascular: No carotid bruit.  Cardiovascular:     Rate and Rhythm: Normal rate and regular rhythm.     Heart sounds: No murmur. No friction rub. No gallop.      Comments: No focal tenderness to the chest wall.  No crepitus or step-offs to the bilateral ribs.  Radial and DP pulses are 2+ and symmetric. Pulmonary:     Effort: Pulmonary effort is normal. No respiratory distress.     Breath sounds: Normal breath sounds. No stridor. No wheezing, rhonchi or rales.  Chest:     Chest wall: No tenderness.  Abdominal:     General: There is no distension.     Palpations: Abdomen is soft. There is no mass.     Tenderness: There is no abdominal tenderness. There is no right CVA tenderness, left CVA tenderness, guarding or rebound.     Hernia: No hernia is present.     Comments: Abdomen is soft, nondistended, nontender.  Musculoskeletal:     Right lower leg: No edema.     Left lower leg: No edema.     Comments: She is diffusely tender to palpation to the bilateral legs, arms, neck, and back as well as the bilateral hips.  No crepitus or step-offs throughout.  No focal pinpoint tenderness.  Full active and passive range of motion of all joints of the bilateral upper and lower extremities.  Lymphadenopathy:     Cervical: No cervical  adenopathy.  Skin:    General: Skin is warm.     Capillary Refill: Capillary refill takes more than 3 seconds.     Findings: No rash.     Comments: Area of ecchymosis noted to the palmar surface of the left hand.  There is a large area of ecchymosis noted over the lateral aspect of the left knee and extending down the lateral aspect of the left lower leg.  There is a chronic appearing ecchymotic sacral wound with surrounding stage II changes that appear acute.  Ecchymosis noted around the left eye without significant edema, erythema, or warmth.  Neurological:     Mental Status: She is alert.     Comments: Oriented to name, year, and president.  She thinks the month is June.  She is not able to state the facility, city, or state where she is currently located.  She speaks in complete, fluent sentences, but is not always certain of the answer to the question.  GCS 15.  Moves all 4 extremities.  5 of 5 strength against resistance of the bilateral upper and lower extremities.  Follows simple commands.  There is a mildly edematous area noted over the left hip that appears to be a developing area of ecchymosis.  Psychiatric:        Behavior: Behavior normal.   Left knee and lower leg   Left palm    Left eye ecchymosis   Sacral Wound     ED Treatments / Results  Labs (all labs ordered are listed, but only abnormal results are displayed) Labs Reviewed  BASIC METABOLIC PANEL - Abnormal; Notable for the following components:      Result Value   Sodium 132 (*)  Potassium 3.4 (*)    Chloride 94 (*)    Glucose, Bld 115 (*)    BUN 33 (*)    Creatinine, Ser 1.10 (*)    GFR calc non Af Amer 46 (*)    GFR calc Af Amer 54 (*)    All other components within normal limits  CBC - Abnormal; Notable for the following components:   WBC 22.3 (*)    RBC 3.54 (*)    Hemoglobin 11.3 (*)    HCT 34.0 (*)    Platelets 587 (*)    All other components within normal limits  URINALYSIS, ROUTINE W  REFLEX MICROSCOPIC - Abnormal; Notable for the following components:   APPearance HAZY (*)    Hgb urine dipstick SMALL (*)    Ketones, ur 5 (*)    Protein, ur 30 (*)    Nitrite POSITIVE (*)    Leukocytes,Ua TRACE (*)    Bacteria, UA MANY (*)    All other components within normal limits  BASIC METABOLIC PANEL - Abnormal; Notable for the following components:   Sodium 134 (*)    Chloride 97 (*)    Glucose, Bld 103 (*)    BUN 34 (*)    Creatinine, Ser 1.14 (*)    GFR calc non Af Amer 44 (*)    GFR calc Af Amer 51 (*)    All other components within normal limits  CBC - Abnormal; Notable for the following components:   WBC 20.0 (*)    RBC 3.27 (*)    Hemoglobin 10.4 (*)    HCT 31.3 (*)    Platelets 532 (*)    All other components within normal limits  URINE CULTURE  CK  TSH  CBC WITH DIFFERENTIAL/PLATELET  CBG MONITORING, ED    EKG EKG Interpretation  Date/Time:  Wednesday January 25 2019 21:02:37 EDT Ventricular Rate:  115 PR Interval:  154 QRS Duration: 66 QT Interval:  304 QTC Calculation: 420 R Axis:   45 Text Interpretation:  Sinus tachycardia Nonspecific ST and T wave abnormality Abnormal ECG Left ventricular hypertrophy When compared with ECG of 10/24/2018, Nonspecific ST and T wave abnormality is now present Confirmed by Delora Fuel ([id number]) on 01/26/2019 1:14:45 AM   Radiology Dg Chest 2 View  Result Date: 01/26/2019 CLINICAL DATA:  85 y/o  F; fall prior to admission. Chest pain. EXAM: CHEST - 2 VIEW COMPARISON:  09/06/2018 CT chest. FINDINGS: Normal cardiac silhouette given projection and technique. Aortic atherosclerosis with calcification. Peripheral basilar pulmonary fibrosis. No consolidation. No pleural effusion or pneumothorax. Bones are demineralized. No acute osseous abnormality is evident. Degenerative changes of the right shoulder joint with superior migration of humerus. IMPRESSION: No acute process identified. Electronically Signed   By: Kristine Garbe M.D.   On: 01/26/2019 00:12   Ct Head Wo Contrast  Result Date: 01/25/2019 CLINICAL DATA:  85 year old female with fall EXAM: CT HEAD WITHOUT CONTRAST CT MAXILLOFACIAL WITHOUT CONTRAST CT CERVICAL SPINE WITHOUT CONTRAST TECHNIQUE: Multidetector CT imaging of the head, cervical spine, and maxillofacial structures were performed using the standard protocol without intravenous contrast. Multiplanar CT image reconstructions of the cervical spine and maxillofacial structures were also generated. COMPARISON:  None. FINDINGS: CT HEAD FINDINGS Brain: No acute intracranial hemorrhage. No midline shift or mass effect. Gray-white differentiation maintained. Unremarkable appearance of the ventricular system. Volume loss. Senescent calcifications of the basal ganglia Vascular: Intracranial calcifications Skull: No acute fracture.  No aggressive bone lesion identified. Sinuses/Orbits: Unremarkable appearance of the  orbits. Mastoid air cells clear. No middle ear effusion. No significant sinus disease. Other: None CT MAXILLOFACIAL FINDINGS Osseous: No mandibular fracture. TMJ aligned bilaterally. No maxillary fracture. Multiple absent teeth with dental amalgams. Dental caries. No ethmoid fracture. No zygomatic fracture. Unremarkable nasal bones. Rightward nasal spur without fracture. Orbits: Unremarkable orbits. Sinuses: Unremarkable sinuses. Soft tissue defect of the anterior nasal septum. Soft tissues: Focal soft tissue swelling lateral to the left orbit on the zygomatic bone. No radiopaque foreign body. CT CERVICAL SPINE FINDINGS Alignment: Craniocervical junction aligned. Anatomic alignment of the cervical elements. No subluxation. Skull base and vertebrae: No acute fracture at the skullbase. Vertebral body heights relatively maintained. No acute fracture identified. Soft tissues and spinal canal: Dense calcifications of the carotid arteries. No lymphadenopathy. No bony canal narrowing or canal hematoma.  Disc levels: Degenerative disc disease present throughout the cervical spine, worst at the C3-C6 levels. Varying degrees bilateral neural foraminal narrowing secondary to uncovertebral joint disease and facet disease. Upper chest: Scarring/atelectasis at the lung apices. Other: None IMPRESSION: Head CT: No acute intracranial abnormality. Maxillofacial CT: No acute fracture. Soft tissue defect of the anterior nasal septum, typically seen with longstanding use vaso constrictive substances. Cervical CT: No acute fracture or malalignment of the cervical spine. Multilevel degenerative changes. Advanced calcifications of the bilateral carotid arteries. Electronically Signed   By: Corrie Mckusick D.O.   On: 01/25/2019 21:31   Ct Cervical Spine Wo Contrast  Result Date: 01/25/2019 CLINICAL DATA:  85 year old female with fall EXAM: CT HEAD WITHOUT CONTRAST CT MAXILLOFACIAL WITHOUT CONTRAST CT CERVICAL SPINE WITHOUT CONTRAST TECHNIQUE: Multidetector CT imaging of the head, cervical spine, and maxillofacial structures were performed using the standard protocol without intravenous contrast. Multiplanar CT image reconstructions of the cervical spine and maxillofacial structures were also generated. COMPARISON:  None. FINDINGS: CT HEAD FINDINGS Brain: No acute intracranial hemorrhage. No midline shift or mass effect. Gray-white differentiation maintained. Unremarkable appearance of the ventricular system. Volume loss. Senescent calcifications of the basal ganglia Vascular: Intracranial calcifications Skull: No acute fracture.  No aggressive bone lesion identified. Sinuses/Orbits: Unremarkable appearance of the orbits. Mastoid air cells clear. No middle ear effusion. No significant sinus disease. Other: None CT MAXILLOFACIAL FINDINGS Osseous: No mandibular fracture. TMJ aligned bilaterally. No maxillary fracture. Multiple absent teeth with dental amalgams. Dental caries. No ethmoid fracture. No zygomatic fracture. Unremarkable  nasal bones. Rightward nasal spur without fracture. Orbits: Unremarkable orbits. Sinuses: Unremarkable sinuses. Soft tissue defect of the anterior nasal septum. Soft tissues: Focal soft tissue swelling lateral to the left orbit on the zygomatic bone. No radiopaque foreign body. CT CERVICAL SPINE FINDINGS Alignment: Craniocervical junction aligned. Anatomic alignment of the cervical elements. No subluxation. Skull base and vertebrae: No acute fracture at the skullbase. Vertebral body heights relatively maintained. No acute fracture identified. Soft tissues and spinal canal: Dense calcifications of the carotid arteries. No lymphadenopathy. No bony canal narrowing or canal hematoma. Disc levels: Degenerative disc disease present throughout the cervical spine, worst at the C3-C6 levels. Varying degrees bilateral neural foraminal narrowing secondary to uncovertebral joint disease and facet disease. Upper chest: Scarring/atelectasis at the lung apices. Other: None IMPRESSION: Head CT: No acute intracranial abnormality. Maxillofacial CT: No acute fracture. Soft tissue defect of the anterior nasal septum, typically seen with longstanding use vaso constrictive substances. Cervical CT: No acute fracture or malalignment of the cervical spine. Multilevel degenerative changes. Advanced calcifications of the bilateral carotid arteries. Electronically Signed   By: Corrie Mckusick D.O.   On: 01/25/2019 21:31  Mr Brain Wo Contrast  Result Date: 01/26/2019 CLINICAL DATA:  Encephalopathy. Altered mental status. Patient found on floor yesterday afternoon. EXAM: MRI HEAD WITHOUT CONTRAST TECHNIQUE: Multiplanar, multiecho pulse sequences of the brain and surrounding structures were obtained without intravenous contrast. COMPARISON:  CT head without contrast 01/25/2019 FINDINGS: Brain: Mild atrophy and white matter changes are within normal limits for age. No acute infarct, hemorrhage, or mass lesion is present. The ventricles are of  proportionate to the degree of atrophy. No significant extraaxial fluid collection is present. The internal auditory canals are within normal limits. The brainstem and cerebellum are within normal limits. Vascular: Flow is present in the major intracranial arteries. Skull and upper cervical spine: Degenerative changes are noted at C1-2. Additional degenerative changes are present in the upper cervical spine. Craniocervical junction is otherwise normal. Midline structures are within normal limits. Sinuses/Orbits: The paranasal sinuses and mastoid air cells are clear. A 10 mm hematoma is present lateral to the left orbit. Bilateral lens replacements are noted. Globes and orbits are otherwise unremarkable. Other: IMPRESSION: 1. Normal MRI appearance of the brain for age. No acute or focal lesion to explain encephalopathy. 2. Small left periorbital hematoma without underlying fracture or globe injury. 3. Degenerative changes of the cervical spine. Electronically Signed   By: San Morelle M.D.   On: 01/26/2019 04:19   Dg Knee Complete 4 Views Left  Result Date: 01/26/2019 CLINICAL DATA:  85 y/o F; fall at home. Hematoma to left hip and left knee. EXAM: LEFT KNEE - COMPLETE 4+ VIEW COMPARISON:  02/10/2018 left lower extremity radiograph FINDINGS: Bones are demineralized. Total knee arthroplasty. No periprosthetic lucency or fracture identified. No malalignment. Vascular calcifications noted. No joint effusion. IMPRESSION: Total knee arthroplasty. No acute fracture or dislocation identified. Electronically Signed   By: Kristine Garbe M.D.   On: 01/26/2019 00:17   Dg Hips Bilat W Or Wo Pelvis 5 Views  Result Date: 01/26/2019 CLINICAL DATA:  85 y/o F; fall at home. Hematoma to the left hip and left knee. EXAM: DG HIP (WITH OR WITHOUT PELVIS) 5+V BILAT COMPARISON:  02/10/2018 hip and pelvis radiographs. FINDINGS: There is no evidence of hip fracture or dislocation. Bones are demineralized. There is  no evidence of arthropathy or other focal bone abnormality. IMPRESSION: No acute fracture or dislocation identified. If suspicion for fracture persists, consider hip MRI or hip CT if MRI is contraindicated/unavailable for further evaluation. Electronically Signed   By: Kristine Garbe M.D.   On: 01/26/2019 00:16   Ct Maxillofacial Wo Cm  Result Date: 01/25/2019 CLINICAL DATA:  85 year old female with fall EXAM: CT HEAD WITHOUT CONTRAST CT MAXILLOFACIAL WITHOUT CONTRAST CT CERVICAL SPINE WITHOUT CONTRAST TECHNIQUE: Multidetector CT imaging of the head, cervical spine, and maxillofacial structures were performed using the standard protocol without intravenous contrast. Multiplanar CT image reconstructions of the cervical spine and maxillofacial structures were also generated. COMPARISON:  None. FINDINGS: CT HEAD FINDINGS Brain: No acute intracranial hemorrhage. No midline shift or mass effect. Gray-white differentiation maintained. Unremarkable appearance of the ventricular system. Volume loss. Senescent calcifications of the basal ganglia Vascular: Intracranial calcifications Skull: No acute fracture.  No aggressive bone lesion identified. Sinuses/Orbits: Unremarkable appearance of the orbits. Mastoid air cells clear. No middle ear effusion. No significant sinus disease. Other: None CT MAXILLOFACIAL FINDINGS Osseous: No mandibular fracture. TMJ aligned bilaterally. No maxillary fracture. Multiple absent teeth with dental amalgams. Dental caries. No ethmoid fracture. No zygomatic fracture. Unremarkable nasal bones. Rightward nasal spur without fracture. Orbits: Unremarkable  orbits. Sinuses: Unremarkable sinuses. Soft tissue defect of the anterior nasal septum. Soft tissues: Focal soft tissue swelling lateral to the left orbit on the zygomatic bone. No radiopaque foreign body. CT CERVICAL SPINE FINDINGS Alignment: Craniocervical junction aligned. Anatomic alignment of the cervical elements. No subluxation.  Skull base and vertebrae: No acute fracture at the skullbase. Vertebral body heights relatively maintained. No acute fracture identified. Soft tissues and spinal canal: Dense calcifications of the carotid arteries. No lymphadenopathy. No bony canal narrowing or canal hematoma. Disc levels: Degenerative disc disease present throughout the cervical spine, worst at the C3-C6 levels. Varying degrees bilateral neural foraminal narrowing secondary to uncovertebral joint disease and facet disease. Upper chest: Scarring/atelectasis at the lung apices. Other: None IMPRESSION: Head CT: No acute intracranial abnormality. Maxillofacial CT: No acute fracture. Soft tissue defect of the anterior nasal septum, typically seen with longstanding use vaso constrictive substances. Cervical CT: No acute fracture or malalignment of the cervical spine. Multilevel degenerative changes. Advanced calcifications of the bilateral carotid arteries. Electronically Signed   By: Corrie Mckusick D.O.   On: 01/25/2019 21:31    Procedures Procedures (including critical care time)  Medications Ordered in ED Medications  levothyroxine (SYNTHROID, LEVOTHROID) tablet 50 mcg (has no administration in time range)  simvastatin (ZOCOR) tablet 20 mg (has no administration in time range)  apixaban (ELIQUIS) tablet 2.5 mg (has no administration in time range)  metoprolol tartrate (LOPRESSOR) tablet 50 mg (50 mg Oral Given 01/26/19 0208)  cefTRIAXone (ROCEPHIN) 1 g in sodium chloride 0.9 % 100 mL IVPB (has no administration in time range)  cefTRIAXone (ROCEPHIN) 1 g in sodium chloride 0.9 % 100 mL IVPB (has no administration in time range)  0.9 %  sodium chloride infusion (has no administration in time range)  acetaminophen (TYLENOL) tablet 650 mg (has no administration in time range)    Or  acetaminophen (TYLENOL) suppository 650 mg (has no administration in time range)  ondansetron (ZOFRAN) tablet 4 mg (has no administration in time range)    Or   ondansetron (ZOFRAN) injection 4 mg (has no administration in time range)  hydrALAZINE (APRESOLINE) injection 10 mg (has no administration in time range)  sodium chloride flush (NS) 0.9 % injection 3 mL (3 mLs Intravenous Given 01/26/19 0030)  sodium chloride 0.9 % bolus 1,000 mL (0 mLs Intravenous Stopped 01/26/19 0301)     Initial Impression / Assessment and Plan / ED Course  I have reviewed the triage vital signs and the nursing notes.  Pertinent labs & imaging results that were available during my care of the patient were reviewed by me and considered in my medical decision making (see chart for details).  Clinical Course as of Jan 26 440  Wed Jan 25, 2019  2306 Left a HIPAA compliant voicemail on the patient's son's cell phone.    [MM]    Clinical Course User Index [MM] Khadeem Rockett A, PA-C       85 year old female with a history of uterine cancer, HTN, atrial fibrillation on Eliquis, and hypothyroidism presenting by EMS from home after a fall.  The patient lives alone and independently.  The patient's son, [name], reports worsening, frequent falls over the last year.  He reports that when he went to her home today at 1800 after calling her frequently for an hour and a half without an answer that she was found on the ground in her bedroom and the floor next to her bed.  He reports she has a history of  chronic ankle and shoulder pain was previously undergoing PT, which she has since ceased.  He reports concern for underlying dementia and the patient was started on donepezil at the end of 2019.  On arrival, she is tachycardic in the 110s with BP 180s/100s.  No hypoxia and she is afebrile with rectal temp checked.  On exam, the patient has several large areas of ecchymosis.  She appears clinically dehydrated.  The patient was seen and independently evaluated by Dr. Zenia Resides, attending physician.  The patient is confused, but this appears to be insidious and that chronic, based on the  conversation with the patient's son says she has been on Aricept for several months for suspected dementia.   CT head, cervical spine, and maxillofacial are negative for acute injury, ICH, or CVA.  Images of the chest, hip, left knee are negative for fracture.  She has a mild hyponatremia that I suspect is secondary to dehydration since the patient clinically appears hypervolemic.  Leukocytosis of 22.3, significantly higher than baseline.  No recent corticosteroids.  Globin is stable from previous.  UA is concerning for infection.  Urine culture sent.  The patient was given a dose of Rocephin in the ER.  She had positive orthostatic vital signs and was given IV fluids.   Per chart review, I saw the patient recently had a positive ANA.  With diffuse joint pain, I question if the patient has an underlying polymyositis versus dermatomyositis.  Given the patient's current clinical status and dehydration with a fall, I think that she would benefit from inpatient admission for further fluid resuscitation and PT/OT assessment since patient lives independently.  Patient's son, [name], has requested an update tomorrow morning.   Final Clinical Impressions(s) / ED Diagnoses   Final diagnoses:  Fall, initial encounter  Wound of sacral region, initial encounter  Acute cystitis with hematuria  Confusion and disorientation    ED Discharge Orders    None       Joanne Gavel, PA-C 01/26/19 0442    Lacretia Leigh, MD 01/26/19 2247

## 2019-01-25 NOTE — ED Triage Notes (Addendum)
Pt arrives via [facility] from home, per EMS pt had a fall in the kitchen 4 hours ago. Pt denies LOC, but thinks she slipped on water while getting up and hit head on kitchen counter. Per EMS pt on thinners. Pt c/o right and left arm pain 9/10 and generalized weakness. Grip strengths equal. Pt hypertensive in triage. Pt has bruising to left eye. Pt in c-collar. Per EMS pt has had 3 falls in past couple days. Pt oriented x4.

## 2019-01-25 NOTE — ED Provider Notes (Signed)
Medical screening examination/treatment/procedure(s) were conducted as a shared visit with non-physician practitioner(s) and myself.  I personally evaluated the patient during the encounter.  None 85 year old female here after unwitnessed fall.  Patient states she has fallen about 3 times.  Has hematoma to her left knee.  Also bruising to left side of her face.  Labs or x-rays are pending at this time anticipate admission   Lacretia Leigh, MD 01/25/19 2328

## 2019-01-26 ENCOUNTER — Encounter (HOSPITAL_COMMUNITY): Payer: Self-pay | Admitting: *Deleted

## 2019-01-26 ENCOUNTER — Inpatient Hospital Stay (HOSPITAL_COMMUNITY): Payer: Medicare Other

## 2019-01-26 ENCOUNTER — Other Ambulatory Visit: Payer: Self-pay

## 2019-01-26 DIAGNOSIS — G9340 Encephalopathy, unspecified: Secondary | ICD-10-CM | POA: Diagnosis not present

## 2019-01-26 DIAGNOSIS — W19XXXD Unspecified fall, subsequent encounter: Secondary | ICD-10-CM | POA: Diagnosis not present

## 2019-01-26 DIAGNOSIS — I10 Essential (primary) hypertension: Secondary | ICD-10-CM

## 2019-01-26 DIAGNOSIS — L8990 Pressure ulcer of unspecified site, unspecified stage: Secondary | ICD-10-CM

## 2019-01-26 DIAGNOSIS — Z79890 Hormone replacement therapy (postmenopausal): Secondary | ICD-10-CM | POA: Diagnosis not present

## 2019-01-26 DIAGNOSIS — R4182 Altered mental status, unspecified: Secondary | ICD-10-CM | POA: Diagnosis present

## 2019-01-26 DIAGNOSIS — R296 Repeated falls: Secondary | ICD-10-CM | POA: Diagnosis present

## 2019-01-26 DIAGNOSIS — E039 Hypothyroidism, unspecified: Secondary | ICD-10-CM

## 2019-01-26 DIAGNOSIS — S0012XA Contusion of left eyelid and periocular area, initial encounter: Secondary | ICD-10-CM | POA: Diagnosis present

## 2019-01-26 DIAGNOSIS — F0390 Unspecified dementia without behavioral disturbance: Secondary | ICD-10-CM | POA: Diagnosis present

## 2019-01-26 DIAGNOSIS — Z7901 Long term (current) use of anticoagulants: Secondary | ICD-10-CM | POA: Diagnosis not present

## 2019-01-26 DIAGNOSIS — S31000A Unspecified open wound of lower back and pelvis without penetration into retroperitoneum, initial encounter: Secondary | ICD-10-CM

## 2019-01-26 DIAGNOSIS — B9620 Unspecified Escherichia coli [E. coli] as the cause of diseases classified elsewhere: Secondary | ICD-10-CM | POA: Diagnosis present

## 2019-01-26 DIAGNOSIS — Z96653 Presence of artificial knee joint, bilateral: Secondary | ICD-10-CM | POA: Diagnosis present

## 2019-01-26 DIAGNOSIS — E876 Hypokalemia: Secondary | ICD-10-CM | POA: Diagnosis present

## 2019-01-26 DIAGNOSIS — E860 Dehydration: Secondary | ICD-10-CM | POA: Diagnosis present

## 2019-01-26 DIAGNOSIS — Z79899 Other long term (current) drug therapy: Secondary | ICD-10-CM | POA: Diagnosis not present

## 2019-01-26 DIAGNOSIS — L89159 Pressure ulcer of sacral region, unspecified stage: Secondary | ICD-10-CM | POA: Diagnosis present

## 2019-01-26 DIAGNOSIS — W19XXXA Unspecified fall, initial encounter: Secondary | ICD-10-CM | POA: Diagnosis present

## 2019-01-26 DIAGNOSIS — E871 Hypo-osmolality and hyponatremia: Secondary | ICD-10-CM | POA: Diagnosis present

## 2019-01-26 DIAGNOSIS — E785 Hyperlipidemia, unspecified: Secondary | ICD-10-CM | POA: Diagnosis present

## 2019-01-26 DIAGNOSIS — Z9181 History of falling: Secondary | ICD-10-CM | POA: Diagnosis not present

## 2019-01-26 DIAGNOSIS — I480 Paroxysmal atrial fibrillation: Secondary | ICD-10-CM | POA: Diagnosis not present

## 2019-01-26 DIAGNOSIS — Z8542 Personal history of malignant neoplasm of other parts of uterus: Secondary | ICD-10-CM | POA: Diagnosis not present

## 2019-01-26 DIAGNOSIS — N3001 Acute cystitis with hematuria: Secondary | ICD-10-CM

## 2019-01-26 DIAGNOSIS — G9341 Metabolic encephalopathy: Secondary | ICD-10-CM | POA: Diagnosis present

## 2019-01-26 DIAGNOSIS — N179 Acute kidney failure, unspecified: Secondary | ICD-10-CM | POA: Diagnosis present

## 2019-01-26 DIAGNOSIS — Y92009 Unspecified place in unspecified non-institutional (private) residence as the place of occurrence of the external cause: Secondary | ICD-10-CM | POA: Diagnosis not present

## 2019-01-26 DIAGNOSIS — Z90710 Acquired absence of both cervix and uterus: Secondary | ICD-10-CM | POA: Diagnosis not present

## 2019-01-26 DIAGNOSIS — S40011A Contusion of right shoulder, initial encounter: Secondary | ICD-10-CM | POA: Diagnosis present

## 2019-01-26 DIAGNOSIS — N390 Urinary tract infection, site not specified: Secondary | ICD-10-CM | POA: Diagnosis present

## 2019-01-26 LAB — BASIC METABOLIC PANEL
Anion gap: 14 (ref 5–15)
BUN: 34 mg/dL — ABNORMAL HIGH (ref 8–23)
CO2: 23 mmol/L (ref 22–32)
Calcium: 9 mg/dL (ref 8.9–10.3)
Chloride: 97 mmol/L — ABNORMAL LOW (ref 98–111)
Creatinine, Ser: 1.14 mg/dL — ABNORMAL HIGH (ref 0.44–1.00)
GFR calc Af Amer: 51 mL/min — ABNORMAL LOW (ref >60)
GFR calc non Af Amer: 44 mL/min — ABNORMAL LOW (ref >60)
Glucose, Bld: 103 mg/dL — ABNORMAL HIGH (ref 70–99)
Potassium: 3.5 mmol/L (ref 3.5–5.1)
Sodium: 134 mmol/L — ABNORMAL LOW (ref 135–145)

## 2019-01-26 LAB — CBC
HCT: 31.3 % — ABNORMAL LOW (ref 36.0–46.0)
Hemoglobin: 10.4 g/dL — ABNORMAL LOW (ref 12.0–15.0)
MCH: 31.8 pg (ref 26.0–34.0)
MCHC: 33.2 g/dL (ref 30.0–36.0)
MCV: 95.7 fL (ref 80.0–100.0)
Platelets: 532 10*3/uL — ABNORMAL HIGH (ref 150–400)
RBC: 3.27 MIL/uL — ABNORMAL LOW (ref 3.87–5.11)
RDW: 13.7 % (ref 11.5–15.5)
WBC: 20 10*3/uL — ABNORMAL HIGH (ref 4.0–10.5)
nRBC: 0 % (ref 0.0–0.2)

## 2019-01-26 LAB — URINALYSIS, ROUTINE W REFLEX MICROSCOPIC
Bilirubin Urine: NEGATIVE
Glucose, UA: NEGATIVE mg/dL
Ketones, ur: 5 mg/dL — AB
Nitrite: POSITIVE — AB
Protein, ur: 30 mg/dL — AB
Specific Gravity, Urine: 1.017 (ref 1.005–1.030)
pH: 5 (ref 5.0–8.0)

## 2019-01-26 LAB — CK: Total CK: 179 U/L (ref 38–234)

## 2019-01-26 LAB — VITAMIN B12: Vitamin B-12: 1418 pg/mL — ABNORMAL HIGH (ref 180–914)

## 2019-01-26 LAB — TSH: TSH: 1.897 u[IU]/mL (ref 0.350–4.500)

## 2019-01-26 MED ORDER — ACETAMINOPHEN 325 MG PO TABS
650.0000 mg | ORAL_TABLET | Freq: Four times a day (QID) | ORAL | Status: DC | PRN
  Administered 2019-01-28 – 2019-01-29 (×3): 650 mg via ORAL
  Filled 2019-01-26 (×3): qty 2

## 2019-01-26 MED ORDER — ASPIRIN 325 MG PO TABS
325.0000 mg | ORAL_TABLET | Freq: Every day | ORAL | Status: DC
  Administered 2019-01-26 – 2019-01-30 (×5): 325 mg via ORAL
  Filled 2019-01-26 (×5): qty 1

## 2019-01-26 MED ORDER — METOPROLOL TARTRATE 25 MG PO TABS: 50.0000 mg | ORAL_TABLET | Freq: Every day | ORAL | Status: DC

## 2019-01-26 MED ORDER — SODIUM CHLORIDE 0.9 % IV SOLN
1.0000 g | INTRAVENOUS | Status: DC
  Administered 2019-01-27 – 2019-01-28 (×2): 1 g via INTRAVENOUS
  Filled 2019-01-26 (×3): qty 10

## 2019-01-26 MED ORDER — ONDANSETRON HCL 4 MG PO TABS: 4.0000 mg | ORAL_TABLET | Freq: Four times a day (QID) | ORAL | Status: DC | PRN

## 2019-01-26 MED ORDER — HYDRALAZINE HCL 20 MG/ML IJ SOLN
10.0000 mg | Freq: Once | INTRAMUSCULAR | Status: AC
  Administered 2019-01-26: 10 mg via INTRAVENOUS
  Filled 2019-01-26: qty 1

## 2019-01-26 MED ORDER — METOPROLOL TARTRATE 50 MG PO TABS
50.0000 mg | ORAL_TABLET | Freq: Every day | ORAL | Status: DC
  Administered 2019-01-26 – 2019-01-30 (×5): 50 mg via ORAL
  Filled 2019-01-26 (×4): qty 1
  Filled 2019-01-26: qty 2
  Filled 2019-01-26: qty 1

## 2019-01-26 MED ORDER — APIXABAN 2.5 MG PO TABS
2.5000 mg | ORAL_TABLET | Freq: Two times a day (BID) | ORAL | Status: DC
  Filled 2019-01-26 (×2): qty 1

## 2019-01-26 MED ORDER — SIMVASTATIN 20 MG PO TABS
20.0000 mg | ORAL_TABLET | Freq: Every day | ORAL | Status: DC
  Administered 2019-01-26 – 2019-01-30 (×5): 20 mg via ORAL
  Filled 2019-01-26 (×5): qty 1

## 2019-01-26 MED ORDER — SODIUM CHLORIDE 0.9 % IV SOLN
1.0000 g | Freq: Once | INTRAVENOUS | Status: AC
  Administered 2019-01-26: 1 g via INTRAVENOUS
  Filled 2019-01-26: qty 10

## 2019-01-26 MED ORDER — SODIUM CHLORIDE 0.9 % IV SOLN
INTRAVENOUS | Status: DC
  Administered 2019-01-26 – 2019-01-27 (×3): via INTRAVENOUS

## 2019-01-26 MED ORDER — ACETAMINOPHEN 650 MG RE SUPP: 650.0000 mg | Freq: Four times a day (QID) | RECTAL | Status: DC | PRN

## 2019-01-26 MED ORDER — LEVOTHYROXINE SODIUM 50 MCG PO TABS
50.0000 ug | ORAL_TABLET | Freq: Every day | ORAL | Status: DC
  Administered 2019-01-26 – 2019-01-30 (×4): 50 ug via ORAL
  Filled 2019-01-26 (×5): qty 1

## 2019-01-26 MED ORDER — SODIUM CHLORIDE 0.9 % IV BOLUS
1000.0000 mL | Freq: Once | INTRAVENOUS | Status: AC
  Administered 2019-01-26: 02:00:00 1000 mL via INTRAVENOUS

## 2019-01-26 MED ORDER — ENOXAPARIN SODIUM 30 MG/0.3ML ~~LOC~~ SOLN
30.0000 mg | SUBCUTANEOUS | Status: DC
  Administered 2019-01-26 – 2019-01-29 (×4): 30 mg via SUBCUTANEOUS
  Filled 2019-01-26 (×4): qty 0.3

## 2019-01-26 MED ORDER — ONDANSETRON HCL 4 MG/2ML IJ SOLN: 4.0000 mg | Freq: Four times a day (QID) | INTRAMUSCULAR | Status: DC | PRN

## 2019-01-26 MED ORDER — ACETAMINOPHEN 500 MG PO TABS: 500.0000 mg | ORAL_TABLET | Freq: Four times a day (QID) | ORAL | Status: DC | PRN

## 2019-01-26 NOTE — ED Notes (Signed)
ED TO INPATIENT HANDOFF REPORT  ED Nurse Name and Phone #:  Patty [phone]  S Name/Age/Gender Dominique [name] 85 y.o. female Room/Bed: 021C/021C  Code Status   Code Status: Full Code  Home/SNF/Other Home Patient oriented to: self Is this baseline? Family states the pt has progressively become confused over the past couple of months. At times she is lucid and AOx4, and at other times she is only alert to self.  Triage Complete: Triage complete  Chief Complaint fall; side pain  Triage Note Pt arrives via [facility] from home, per EMS pt had a fall in the kitchen 4 hours ago. Pt denies LOC, but thinks she slipped on water while getting up and hit head on kitchen counter. Per EMS pt on thinners. Pt c/o right and left arm pain 9/10 and generalized weakness. Grip strengths equal. Pt hypertensive in triage. Pt has bruising to left eye. Pt in c-collar. Per EMS pt has had 3 falls in past couple days. Pt oriented x4.   Allergies Allergies  Allergen Reactions  . Codeine   . Darvocet [Propoxyphene N-Acetaminophen]   . Demerol   . Percocet [Oxycodone-Acetaminophen]   . Prednisone     Uncertain this is true. Was documented when patient was admitted 02/12/2012, but not recalled by either the patient nor her son. Tolerating it easily 08/2016.     Level of Care/Admitting Diagnosis ED Disposition    ED Disposition Condition [facility] Area: [place] [100100]  Level of Care: Telemetry Medical [104]  Diagnosis: Acute encephalopathy [976734]  Admitting Physician: Doreatha Massed  Attending Physician: Etta Quill [phone]  Estimated length of stay: past midnight tomorrow  Certification:: I certify this patient will need inpatient services for at least 2 midnights  Possible Covid Disease Patient Isolation: N/A  PT Class (Do Not Modify): Inpatient [101]  PT Acc Code (Do Not Modify): Private [1]       B Medical/Surgery History Past Medical History:   Diagnosis Date  . Anemia   . Cancer ([place])    uterine  . Hypertension   . Hyponatremia   . Hypothyroidism   . LVH (left ventricular hypertrophy)   . PAF (paroxysmal atrial fibrillation) ([place])    a. initial dx 2012 in setting of acute illness. b. began to recur 09/2017; started on amiodarone 11/2017.  Marland Kitchen Shingles   . Thrombocytopenia ([place])    Past Surgical History:  Procedure Laterality Date  . ABDOMINAL HYSTERECTOMY    . JOINT REPLACEMENT Bilateral    knees     A IV Location/Drains/Wounds Patient Lines/Drains/Airways Status   Active Line/Drains/Airways    Name:   Placement date:   Placement time:   Site:   Days:   Peripheral IV 01/26/19 Right;Anterior Antecubital   01/26/19    0029    Antecubital   less than 1   External Urinary Catheter   01/26/19    0002    -   less than 1          Intake/Output Last 24 hours No intake or output data in the 24 hours ending 01/26/19 0227  Labs/Imaging Results for orders placed or performed during the hospital encounter of 01/25/19 (from the past 48 hour(s))  Urinalysis, Routine w reflex microscopic     Status: Abnormal   Collection Time: 01/25/19 12:26 AM  Result Value Ref Range   Color, Urine YELLOW YELLOW   APPearance HAZY (A) CLEAR   Specific Gravity, Urine  1.017 1.005 - 1.030   pH 5.0 5.0 - 8.0   Glucose, UA NEGATIVE NEGATIVE mg/dL   Hgb urine dipstick SMALL (A) NEGATIVE   Bilirubin Urine NEGATIVE NEGATIVE   Ketones, ur 5 (A) NEGATIVE mg/dL   Protein, ur 30 (A) NEGATIVE mg/dL   Nitrite POSITIVE (A) NEGATIVE   Leukocytes,Ua TRACE (A) NEGATIVE   RBC / HPF 0-5 0 - 5 RBC/hpf   WBC, UA 11-20 0 - 5 WBC/hpf   Bacteria, UA MANY (A) NONE SEEN   Squamous Epithelial / LPF 0-5 0 - 5    Comment: Performed at [facility], [place] [address], [place], [place] [address]  Basic metabolic panel     Status: Abnormal   Collection Time: 01/25/19  8:30 PM  Result Value Ref Range   Sodium 132 (L) 135 - 145 mmol/L   Potassium 3.4 (L) 3.5 -  5.1 mmol/L   Chloride 94 (L) 98 - 111 mmol/L   CO2 24 22 - 32 mmol/L   Glucose, Bld 115 (H) 70 - 99 mg/dL   BUN 33 (H) 8 - 23 mg/dL   Creatinine, Ser 1.10 (H) 0.44 - 1.00 mg/dL   Calcium 9.4 8.9 - 10.3 mg/dL   GFR calc non Af Amer 46 (L) >60 mL/min   GFR calc Af Amer 54 (L) >60 mL/min   Anion gap 14 5 - 15    Comment: Performed at [facility] [address], [place], [place] [address]  CBC     Status: Abnormal   Collection Time: 01/25/19  8:30 PM  Result Value Ref Range   WBC 22.3 (H) 4.0 - 10.5 K/uL   RBC 3.54 (L) 3.87 - 5.11 MIL/uL   Hemoglobin 11.3 (L) 12.0 - 15.0 g/dL   HCT 34.0 (L) 36.0 - 46.0 %   MCV 96.0 80.0 - 100.0 fL   MCH 31.9 26.0 - 34.0 pg   MCHC 33.2 30.0 - 36.0 g/dL   RDW 13.6 11.5 - 15.5 %   Platelets 587 (H) 150 - 400 K/uL   nRBC 0.0 0.0 - 0.2 %    Comment: Performed at [facility] [address], [place], [place] [address]   Dg Chest 2 View  Result Date: 01/26/2019 CLINICAL DATA:  85 y/o  F; fall prior to admission. Chest pain. EXAM: CHEST - 2 VIEW COMPARISON:  09/06/2018 CT chest. FINDINGS: Normal cardiac silhouette given projection and technique. Aortic atherosclerosis with calcification. Peripheral basilar pulmonary fibrosis. No consolidation. No pleural effusion or pneumothorax. Bones are demineralized. No acute osseous abnormality is evident. Degenerative changes of the right shoulder joint with superior migration of humerus. IMPRESSION: No acute process identified. Electronically Signed   By: Kristine Garbe M.D.   On: 01/26/2019 00:12   Ct Head Wo Contrast  Result Date: 01/25/2019 CLINICAL DATA:  85 year old female with fall EXAM: CT HEAD WITHOUT CONTRAST CT MAXILLOFACIAL WITHOUT CONTRAST CT CERVICAL SPINE WITHOUT CONTRAST TECHNIQUE: Multidetector CT imaging of the head, cervical spine, and maxillofacial structures were performed using the standard protocol without intravenous contrast. Multiplanar CT image reconstructions of the  cervical spine and maxillofacial structures were also generated. COMPARISON:  None. FINDINGS: CT HEAD FINDINGS Brain: No acute intracranial hemorrhage. No midline shift or mass effect. Gray-white differentiation maintained. Unremarkable appearance of the ventricular system. Volume loss. Senescent calcifications of the basal ganglia Vascular: Intracranial calcifications Skull: No acute fracture.  No aggressive bone lesion identified. Sinuses/Orbits: Unremarkable appearance of the orbits. Mastoid air cells clear.  No middle ear effusion. No significant sinus disease. Other: None CT MAXILLOFACIAL FINDINGS Osseous: No mandibular fracture. TMJ aligned bilaterally. No maxillary fracture. Multiple absent teeth with dental amalgams. Dental caries. No ethmoid fracture. No zygomatic fracture. Unremarkable nasal bones. Rightward nasal spur without fracture. Orbits: Unremarkable orbits. Sinuses: Unremarkable sinuses. Soft tissue defect of the anterior nasal septum. Soft tissues: Focal soft tissue swelling lateral to the left orbit on the zygomatic bone. No radiopaque foreign body. CT CERVICAL SPINE FINDINGS Alignment: Craniocervical junction aligned. Anatomic alignment of the cervical elements. No subluxation. Skull base and vertebrae: No acute fracture at the skullbase. Vertebral body heights relatively maintained. No acute fracture identified. Soft tissues and spinal canal: Dense calcifications of the carotid arteries. No lymphadenopathy. No bony canal narrowing or canal hematoma. Disc levels: Degenerative disc disease present throughout the cervical spine, worst at the C3-C6 levels. Varying degrees bilateral neural foraminal narrowing secondary to uncovertebral joint disease and facet disease. Upper chest: Scarring/atelectasis at the lung apices. Other: None IMPRESSION: Head CT: No acute intracranial abnormality. Maxillofacial CT: No acute fracture. Soft tissue defect of the anterior nasal septum, typically seen with  longstanding use vaso constrictive substances. Cervical CT: No acute fracture or malalignment of the cervical spine. Multilevel degenerative changes. Advanced calcifications of the bilateral carotid arteries. Electronically Signed   By: Corrie Mckusick D.O.   On: 01/25/2019 21:31   Ct Cervical Spine Wo Contrast  Result Date: 01/25/2019 CLINICAL DATA:  85 year old female with fall EXAM: CT HEAD WITHOUT CONTRAST CT MAXILLOFACIAL WITHOUT CONTRAST CT CERVICAL SPINE WITHOUT CONTRAST TECHNIQUE: Multidetector CT imaging of the head, cervical spine, and maxillofacial structures were performed using the standard protocol without intravenous contrast. Multiplanar CT image reconstructions of the cervical spine and maxillofacial structures were also generated. COMPARISON:  None. FINDINGS: CT HEAD FINDINGS Brain: No acute intracranial hemorrhage. No midline shift or mass effect. Gray-white differentiation maintained. Unremarkable appearance of the ventricular system. Volume loss. Senescent calcifications of the basal ganglia Vascular: Intracranial calcifications Skull: No acute fracture.  No aggressive bone lesion identified. Sinuses/Orbits: Unremarkable appearance of the orbits. Mastoid air cells clear. No middle ear effusion. No significant sinus disease. Other: None CT MAXILLOFACIAL FINDINGS Osseous: No mandibular fracture. TMJ aligned bilaterally. No maxillary fracture. Multiple absent teeth with dental amalgams. Dental caries. No ethmoid fracture. No zygomatic fracture. Unremarkable nasal bones. Rightward nasal spur without fracture. Orbits: Unremarkable orbits. Sinuses: Unremarkable sinuses. Soft tissue defect of the anterior nasal septum. Soft tissues: Focal soft tissue swelling lateral to the left orbit on the zygomatic bone. No radiopaque foreign body. CT CERVICAL SPINE FINDINGS Alignment: Craniocervical junction aligned. Anatomic alignment of the cervical elements. No subluxation. Skull base and vertebrae: No acute  fracture at the skullbase. Vertebral body heights relatively maintained. No acute fracture identified. Soft tissues and spinal canal: Dense calcifications of the carotid arteries. No lymphadenopathy. No bony canal narrowing or canal hematoma. Disc levels: Degenerative disc disease present throughout the cervical spine, worst at the C3-C6 levels. Varying degrees bilateral neural foraminal narrowing secondary to uncovertebral joint disease and facet disease. Upper chest: Scarring/atelectasis at the lung apices. Other: None IMPRESSION: Head CT: No acute intracranial abnormality. Maxillofacial CT: No acute fracture. Soft tissue defect of the anterior nasal septum, typically seen with longstanding use vaso constrictive substances. Cervical CT: No acute fracture or malalignment of the cervical spine. Multilevel degenerative changes. Advanced calcifications of the bilateral carotid arteries. Electronically Signed   By: Corrie Mckusick D.O.   On: 01/25/2019 21:31   Dg Knee Complete  4 Views Left  Result Date: 01/26/2019 CLINICAL DATA:  85 y/o F; fall at home. Hematoma to left hip and left knee. EXAM: LEFT KNEE - COMPLETE 4+ VIEW COMPARISON:  02/10/2018 left lower extremity radiograph FINDINGS: Bones are demineralized. Total knee arthroplasty. No periprosthetic lucency or fracture identified. No malalignment. Vascular calcifications noted. No joint effusion. IMPRESSION: Total knee arthroplasty. No acute fracture or dislocation identified. Electronically Signed   By: Kristine Garbe M.D.   On: 01/26/2019 00:17   Dg Hips Bilat W Or Wo Pelvis 5 Views  Result Date: 01/26/2019 CLINICAL DATA:  85 y/o F; fall at home. Hematoma to the left hip and left knee. EXAM: DG HIP (WITH OR WITHOUT PELVIS) 5+V BILAT COMPARISON:  02/10/2018 hip and pelvis radiographs. FINDINGS: There is no evidence of hip fracture or dislocation. Bones are demineralized. There is no evidence of arthropathy or other focal bone abnormality.  IMPRESSION: No acute fracture or dislocation identified. If suspicion for fracture persists, consider hip MRI or hip CT if MRI is contraindicated/unavailable for further evaluation. Electronically Signed   By: Kristine Garbe M.D.   On: 01/26/2019 00:16   Ct Maxillofacial Wo Cm  Result Date: 01/25/2019 CLINICAL DATA:  85 year old female with fall EXAM: CT HEAD WITHOUT CONTRAST CT MAXILLOFACIAL WITHOUT CONTRAST CT CERVICAL SPINE WITHOUT CONTRAST TECHNIQUE: Multidetector CT imaging of the head, cervical spine, and maxillofacial structures were performed using the standard protocol without intravenous contrast. Multiplanar CT image reconstructions of the cervical spine and maxillofacial structures were also generated. COMPARISON:  None. FINDINGS: CT HEAD FINDINGS Brain: No acute intracranial hemorrhage. No midline shift or mass effect. Gray-white differentiation maintained. Unremarkable appearance of the ventricular system. Volume loss. Senescent calcifications of the basal ganglia Vascular: Intracranial calcifications Skull: No acute fracture.  No aggressive bone lesion identified. Sinuses/Orbits: Unremarkable appearance of the orbits. Mastoid air cells clear. No middle ear effusion. No significant sinus disease. Other: None CT MAXILLOFACIAL FINDINGS Osseous: No mandibular fracture. TMJ aligned bilaterally. No maxillary fracture. Multiple absent teeth with dental amalgams. Dental caries. No ethmoid fracture. No zygomatic fracture. Unremarkable nasal bones. Rightward nasal spur without fracture. Orbits: Unremarkable orbits. Sinuses: Unremarkable sinuses. Soft tissue defect of the anterior nasal septum. Soft tissues: Focal soft tissue swelling lateral to the left orbit on the zygomatic bone. No radiopaque foreign body. CT CERVICAL SPINE FINDINGS Alignment: Craniocervical junction aligned. Anatomic alignment of the cervical elements. No subluxation. Skull base and vertebrae: No acute fracture at the  skullbase. Vertebral body heights relatively maintained. No acute fracture identified. Soft tissues and spinal canal: Dense calcifications of the carotid arteries. No lymphadenopathy. No bony canal narrowing or canal hematoma. Disc levels: Degenerative disc disease present throughout the cervical spine, worst at the C3-C6 levels. Varying degrees bilateral neural foraminal narrowing secondary to uncovertebral joint disease and facet disease. Upper chest: Scarring/atelectasis at the lung apices. Other: None IMPRESSION: Head CT: No acute intracranial abnormality. Maxillofacial CT: No acute fracture. Soft tissue defect of the anterior nasal septum, typically seen with longstanding use vaso constrictive substances. Cervical CT: No acute fracture or malalignment of the cervical spine. Multilevel degenerative changes. Advanced calcifications of the bilateral carotid arteries. Electronically Signed   By: Corrie Mckusick D.O.   On: 01/25/2019 21:31    Pending Labs Unresulted Labs (From admission, onward)    Start     Ordered   01/26/19 8466  Basic metabolic panel  Tomorrow morning,   R     01/26/19 0205   01/26/19 0500  CBC  Tomorrow  morning,   R     01/26/19 0205   01/26/19 0205  TSH  Once,   R     01/26/19 0204   01/26/19 0042  Urine culture  Add-on,   STAT     01/26/19 0042   01/25/19 2301  CK  Add-on,   STAT     01/25/19 2318   01/25/19 2030  CBC with Differential/Platelet  Once,   R     01/25/19 2030          Vitals/Pain Today's Vitals   01/26/19 0030 01/26/19 0100 01/26/19 0208 01/26/19 0223  BP:  (!) 187/81    Pulse:  100 (!) 104 (!) 102  Resp:  (!) 21  18  Temp:      TempSrc:      SpO2:  97%  100%  PainSc: 9        Isolation Precautions No active isolations  Medications Medications  levothyroxine (SYNTHROID, LEVOTHROID) tablet 50 mcg (has no administration in time range)  simvastatin (ZOCOR) tablet 20 mg (has no administration in time range)  apixaban (ELIQUIS) tablet 2.5 mg  (has no administration in time range)  metoprolol tartrate (LOPRESSOR) tablet 50 mg (50 mg Oral Given 01/26/19 0208)  cefTRIAXone (ROCEPHIN) 1 g in sodium chloride 0.9 % 100 mL IVPB (has no administration in time range)  cefTRIAXone (ROCEPHIN) 1 g in sodium chloride 0.9 % 100 mL IVPB (has no administration in time range)  0.9 %  sodium chloride infusion (has no administration in time range)  acetaminophen (TYLENOL) tablet 650 mg (has no administration in time range)    Or  acetaminophen (TYLENOL) suppository 650 mg (has no administration in time range)  ondansetron (ZOFRAN) tablet 4 mg (has no administration in time range)    Or  ondansetron (ZOFRAN) injection 4 mg (has no administration in time range)  sodium chloride flush (NS) 0.9 % injection 3 mL (3 mLs Intravenous Given 01/26/19 0030)  sodium chloride 0.9 % bolus 1,000 mL (1,000 mLs Intravenous New Bag/Given 01/26/19 0145)    Mobility walks     Focused Assessments    R Recommendations: See Admitting Provider Note  Report given to:   Additional Notes:  Pt is a poor historian. She reports that she does not walk with the assistance of a cane or walker but it is unknown if she is supposed to. She stood with assistance and was unsteady. She is a high fall risk.

## 2019-01-26 NOTE — Progress Notes (Signed)
I have seen and assessed patient and agree with Dr. Juleen China assessment and plan.  Patient is a 85 year old female history of hypertension, paroxysmal A. fib on Eliquis, hypothyroidism brought in the ED after EMS was called by patient's son and patient found on the floor next to her bed.  Patient not quite sure what happened.  Urinalysis done concerning for UTI.  Patient does endorse some dysuria.  Per H&P is noted that patient has had increasing falls since Christmas.  Patient with slight improvement with encephalopathy.  MRI brain was negative.  TSH within normal limits.  Continue empiric antibiotics of IV Rocephin.  Will discontinue patient's anticoagulation with Eliquis as patient with recurrent falls and concern for possible dementia likely a poor candidate for anticoagulation at this time.  Will place on aspirin 325 mg daily.  No charge.

## 2019-01-26 NOTE — TOC Initial Note (Signed)
Transition of Care [facility]) - Initial/Assessment Note    Patient Details  Name: Dominique [name] MRN: [id number] Date of Birth: December 01, 1932  Transition of Care [facility]) CM/SW Contact:    Dominique Collet, RN Phone Number: 01/26/2019, 2:10 PM  Clinical Narrative:                Dominique [name] to son Dominique [name] who states that he is not comfortable with his mon being home alone. He states though it is not his decision to make on his own, and he needs to discuss SNF with his siblings. He states that he has been in contact with CSW who has been helpful explaining visitation restrictions and steps that SNF are taking to ensure safety of residents. CM/ CSW will continue to follow.    Expected Discharge Plan: Skilled Nursing Facility Barriers to Discharge: Continued Medical Work up   Patient Goals and CMS Choice        Expected Discharge Plan and Services Expected Discharge Plan: [place]                                  Prior Living Arrangements/Services                       Activities of Daily Living   ADL Screening (condition at time of admission) Patient's cognitive ability adequate to safely complete daily activities?: No Is the patient deaf or have difficulty hearing?: No Does the patient have difficulty seeing, even when wearing glasses/contacts?: No Does the patient have difficulty concentrating, remembering, or making decisions?: Yes Patient able to express need for assistance with ADLs?: Yes Does the patient have difficulty dressing or bathing?: Yes Independently performs ADLs?: No Communication: Independent Dressing (OT): Needs assistance Is this a change from baseline?: Change from baseline, expected to last <3days Grooming: Needs assistance Is this a change from baseline?: Change from baseline, expected to last <3 days Feeding: Independent Bathing: Needs assistance Is this a change from baseline?: Change from baseline, expected to last <3 days Toileting:  Needs assistance Is this a change from baseline?: Change from baseline, expected to last <3 days In/Out Bed: Needs assistance Is this a change from baseline?: Change from baseline, expected to last <3 days Does the patient have difficulty walking or climbing stairs?: Yes Weakness of Legs: Both Weakness of Arms/Hands: None  Permission Sought/Granted                  Emotional Assessment              Admission diagnosis:  fall; side pain Patient Active Problem List   Diagnosis Date Noted  . Acute encephalopathy 01/26/2019  . Falls 01/26/2019  . Pressure injury of skin 01/26/2019  . Abnormal CT of the chest 07/14/2018  . 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 PAF (paroxysmal atrial fibrillation) ([place]) 11/26/2017  . Atrial fibrillation with RVR ([place])   . Essential hypertension   . History of uterine cancer 09/02/2016  . Benign essential HTN 09/02/2016  . Hypothyroidism 09/02/2016  . History of shingles 09/02/2016  . History of iron deficiency anemia 09/02/2016  . History of thrombocytopenia 09/02/2016   PCP:  Dominique Solian, MD Pharmacy:   [facility] [address] [place] - [address] [place] [address] Phone: [phone] Fax: [phone]     Social Determinants of Health (SDOH) Interventions    Readmission Risk  Interventions No flowsheet data found.

## 2019-01-26 NOTE — TOC Progression Note (Addendum)
Transition of Care [facility]) - Progression Note    Patient Details  Name: Dominique [name] MRN: [id number] Date of Birth: 03-13-1933  Transition of Care [facility]) CM/SW Contact  Dominique Salina Mila Homer, LCSW Phone Number: 01/26/2019, 2:46 PM  Clinical Narrative: CSW talked with son regarding discharge recommendation from   PT/OT of ST rehab.Son again indicated that he is concerned about his mom going to a skilled nursing facility due to COVID-19 and this was discussed. CSW  Explained assured son that patient's discharge disposition is up to the family and the alternative option given - patient discharging home. [name] was advised of how he could access the nursing facility list for [facility]. [name] advised CSW that he will talk with his family, that included his brother who lives in [place], [place] and his significant other.   Expected Discharge Plan: Skilled Nursing Facility Barriers to Discharge: Continued Medical Work up  Expected Discharge Plan and Services Expected Discharge Plan: [place] versus home with HH/family support.                                 Social Determinants of Health (SDOH) Interventions  Not discussed.  Readmission Risk Interventions No flowsheet data found.

## 2019-01-26 NOTE — Discharge Instructions (Signed)

## 2019-01-26 NOTE — ED Notes (Signed)
Patient transported to MRI and then to 5M17.

## 2019-01-26 NOTE — ED Provider Notes (Signed)
Patient's son called for update.  He was provided her room number and info regarding her admit thus far.   Carmin Muskrat, MD 01/26/19 [phone]

## 2019-01-26 NOTE — ED Notes (Signed)
Transported to MRI , transport will take patient to her room post;MRI

## 2019-01-26 NOTE — Evaluation (Signed)
Occupational Therapy Evaluation Patient Details Name: Dominique [name] MRN: [id number] DOB: 28-Feb-1933 Today's Date: 01/26/2019    History of Present Illness 85 y.o. female with medical history significant of HTN, PAF on eliquis, hypothyroidism. Pt admitted with a fall, UTI, sacral wound, confusion.    Clinical Impression   Pt admitted with fall and confusion. Pt currently with functional limitations due to the deficits listed below (see OT Problem List).  Pt will benefit from skilled OT to increase their safety and independence with ADL and functional mobility for ADL to facilitate discharge to venue listed below. Pt lives with son but does report son travels.  Pt would need 24/7 A at home if DC home.  At baseline pt states she drives to store and stays alone at night at times     Follow Up Recommendations  Home health OT;SNF;Supervision/Assistance - 24 hour(depending on son's ability to A)    Equipment Recommendations  None recommended by OT    Recommendations for Other Services       Precautions / Restrictions Precautions Precautions: Fall Precaution Comments: fall just PTA, per chart multiple falls Restrictions Weight Bearing Restrictions: No      Mobility Bed Mobility Overal bed mobility: Needs Assistance Bed Mobility: Rolling;Sidelying to Sit Rolling: Min assist Sidelying to sit: Min assist          Transfers Overall transfer level: Needs assistance Equipment used: 1 person hand held assist Transfers: Sit to/from [facility] Sit to Stand: Min assist Stand pivot transfers: Min assist       General transfer comment: min assist to rise and steady: SPT x 2 (bed to 3 in 1, then to recliner), VCs for hand placement, mild unsteadiness    Balance Overall balance assessment: Needs assistance Sitting-balance support: Feet supported Sitting balance-Leahy Scale: Good     Standing balance support: Single extremity supported Standing balance-Leahy  Scale: Poor Standing balance comment: relies on single UE support in standing                           ADL either performed or assessed with clinical judgement   ADL Overall ADL's : Needs assistance/impaired Eating/Feeding: Set up;Sitting   Grooming: Set up;Sitting   Upper Body Bathing: Set up;Minimal assistance   Lower Body Bathing: Moderate assistance;Sit to/from stand;Cueing for sequencing;Cueing for safety   Upper Body Dressing : Minimal assistance;Sitting   Lower Body Dressing: Moderate assistance;Sit to/from stand;Cueing for sequencing;Cueing for safety   Toilet Transfer: Minimal assistance;+2 for safety/equipment;+2 for physical assistance;Stand-pivot;BSC   Toileting- Clothing Manipulation and Hygiene: Minimal assistance;Sit to/from stand;Cueing for sequencing;Cueing for safety       Functional mobility during ADLs: Cueing for sequencing;Cueing for safety General ADL Comments: Pt states son will A as needed     Vision Baseline Vision/History: Wears glasses(glasses not in room. )              Pertinent Vitals/Pain Pain Assessment: No/denies pain     Hand Dominance     Extremity/Trunk Assessment Upper Extremity Assessment Upper Extremity Assessment: Generalized weakness   Lower Extremity Assessment Lower Extremity Assessment: Generalized weakness(B knee extension -4/5)   Cervical / Trunk Assessment Cervical / Trunk Assessment: Normal   Communication Communication Communication: No difficulties   Cognition Arousal/Alertness: Awake/alert Behavior During Therapy: WFL for tasks assessed/performed Overall Cognitive Status: No family/caregiver present to determine baseline cognitive functioning  General Comments: per chart pt has memory deficits, pt oriented to self, not to month, knows she's in hospital but thinks she's at [facility]  expects to be discharged to:: Skilled nursing facility Living Arrangements: Children(pt stated she lives with son, will need to confirm this) Available Help at Discharge: Family;Available PRN/intermittently   Home Access: Stairs to enter Entrance Stairs-Number of Steps: 3   Home Layout: One level               Home Equipment: Walker - 2 wheels;Cane - single point;Grab bars - tub/shower          Prior Functioning/Environment Level of Independence: Independent                 OT Problem List: Decreased strength;Decreased activity tolerance;Decreased knowledge of use of DME or AE;Decreased safety awareness      OT Treatment/Interventions: Self-care/ADL training;Patient/family education;Therapeutic activities;DME and/or AE instruction    OT Goals(Current goals can be found in the care plan section) Acute Rehab OT Goals Patient Stated Goal: none stated OT Goal Formulation: With patient Time For Goal Achievement: 02/02/19  OT Frequency: Min 2X/week   Barriers to D/C:            Co-evaluation   Reason for Co-Treatment: Complexity of the patient's impairments (multi-system involvement);For patient/therapist safety;To address functional/ADL transfers PT goals addressed during session: Mobility/safety with mobility;Balance        AM-PAC OT "6 Clicks" Daily Activity     Outcome Measure Help from another person eating meals?: A Little Help from another person taking care of personal grooming?: A Little Help from another person toileting, which includes using toliet, bedpan, or urinal?: A Little Help from another person bathing (including washing, rinsing, drying)?: A Little Help from another person to put on and taking off regular upper body clothing?: A Little Help from another person to put on and taking off regular lower body clothing?: A Little 6 Click Score: 18   End of Session Nurse Communication: Mobility status  Activity Tolerance: Patient tolerated  treatment well Patient left: in chair;with call bell/phone within reach;with chair alarm set  OT Visit Diagnosis: Unsteadiness on feet (R26.81);Muscle weakness (generalized) (M62.81);Repeated falls (R29.6);History of falling (Z91.81)                Time: 0347-4259 OT Time Calculation (min): 34 min Charges:  OT General Charges $OT Visit: 1 Visit OT Evaluation $OT Eval Moderate Complexity: 1 Mod  Kari Baars, [place] [phone] Office- [phone], Edwena Felty D 01/26/2019, 12:15 PM

## 2019-01-26 NOTE — H&P (Signed)
History and Physical    Dominique [name] LPF:[id number] DOB: 03/25/1933 DOA: 01/25/2019  PCP: [name], MD  Patient coming from: Home  I have personally briefly reviewed patient's old medical records in [place]  Chief Complaint: AMS  HPI: Dominique [name] is a 85 y.o. female with medical history significant of HTN, PAF on eliquis, hypothyroidism.  Patient brought in to ED after EMS called by patients son.  Son reports that he attempted to call his mother at 86 earlier today.  He reports that he called numerous times and after not receiving an answer he went over to her house at approximately 1800 and found her laying on the floor next to her bed.  Unsure of what time patient actually initially fell.  Patient is quite confused in triage, also unclear how many falls over past few days she has had.  Turns out she has had episodic confusion since Christmas per son.  Today for example patient reports that she lives at home with her husband.  However, when I asked the patient's son he reports that the patient's husband passed away several years ago.  Son reports reports that the patient became very confused just after Christmas and called him crying and stating that she was unable to find her husband.  He reports that he took her to his PCP and she was started on donezepil as there was suspected to be some underlying dementia, but she has not further been worked up for these complaints.  He reports that she has had worsening confusion after previous falls.  He reports that the number of falls that she has had has significantly increased over the last year.   ED Course: Tm 99.1, HR 110s, no fracture findings on numerous imaging studies.  CT head neg.  WBC 22k, UA suspicious for possible UTI.   Review of Systems: As per HPI otherwise 10 point review of systems negative.   Past Medical History:  Diagnosis Date  . Anemia   . Cancer ([place])    uterine  . Hypertension   .  Hyponatremia   . Hypothyroidism   . LVH (left ventricular hypertrophy)   . PAF (paroxysmal atrial fibrillation) ([place])    a. initial dx 2012 in setting of acute illness. b. began to recur 09/2017; started on amiodarone 11/2017.  Marland Kitchen Shingles   . Thrombocytopenia ([place])     Past Surgical History:  Procedure Laterality Date  . ABDOMINAL HYSTERECTOMY    . JOINT REPLACEMENT Bilateral    knees     reports that she has never smoked. She has never used smokeless tobacco. She reports that she does not drink alcohol or use drugs.  Allergies  Allergen Reactions  . Codeine   . Darvocet [Propoxyphene N-Acetaminophen]   . Demerol   . Percocet [Oxycodone-Acetaminophen]   . Prednisone     Uncertain this is true. Was documented when patient was admitted 02/12/2012, but not recalled by either the patient nor her son. Tolerating it easily 08/2016.     Family History  Problem Relation Age of Onset  . Tuberculosis Father   . Alcoholism Brother   . Hypertension Son      Prior to Admission medications   Medication Sig Start Date End Date Taking? Authorizing Provider  acetaminophen (TYLENOL) 500 MG tablet Take 500 mg by mouth every 6 (six) hours as needed for mild pain or headache.     [provider]  ELIQUIS 2.5 MG TABS tablet TAKE 1 TABLET  BY MOUTH TWICE DAILY. 11/09/18   Sherran Needs, NP  levothyroxine (SYNTHROID, LEVOTHROID) 50 MCG tablet Take 50 mcg by mouth daily before breakfast.    [provider]  metoprolol tartrate (LOPRESSOR) 50 MG tablet Take 50 mg by mouth daily.    [provider]  simvastatin (ZOCOR) 20 MG tablet Take 20 mg by mouth daily.    [provider]    Physical Exam: Vitals:   01/26/19 0011 01/26/19 0100 01/26/19 0208 01/26/19 0223  BP:  (!) 187/81    Pulse:  100 (!) 104 (!) 102  Resp:  (!) 21  18  Temp: 99.1 F (37.3 C)     TempSrc: Rectal     SpO2:  97%  100%    Constitutional: NAD, calm, comfortable Eyes: PERRL, lids and  conjunctivae normal ENMT: Mucous membranes are moist. Posterior pharynx clear of any exudate or lesions.Normal dentition.  Neck: normal, supple, no masses, no thyromegaly Respiratory: clear to auscultation bilaterally, no wheezing, no crackles. Normal respiratory effort. No accessory muscle use.  Cardiovascular: Regular rate and rhythm, no murmurs / rubs / gallops. No extremity edema. 2+ pedal pulses. No carotid bruits.  Abdomen: no tenderness, no masses palpated. No hepatosplenomegaly. Bowel sounds positive.  Musculoskeletal: no clubbing / cyanosis. No joint deformity upper and lower extremities. Good ROM, no contractures. Normal muscle tone.  Skin: Left knee and lower leg   Left palm    Left eye ecchymosis   Sacral Wound  Neurologic: CN 2-12 grossly intact. Sensation intact, DTR normal. Strength 5/5 in all 4.  Psychiatric: Normal judgment and insight. Confused, oriented to self, year, able to name president.  Not oriented to place, month, recent fall history. Normal mood.    Labs on Admission: I have personally reviewed following labs and imaging studies  CBC: Recent Labs  Lab 01/25/19 2030  WBC 22.3*  HGB 11.3*  HCT 34.0*  MCV 96.0  PLT 161*   Basic Metabolic Panel: Recent Labs  Lab 01/25/19 2030  NA 132*  K 3.4*  CL 94*  CO2 24  GLUCOSE 115*  BUN 33*  CREATININE 1.10*  CALCIUM 9.4   GFR: CrCl cannot be calculated (Unknown ideal weight.). Liver Function Tests: No results for input(s): AST, ALT, ALKPHOS, BILITOT, PROT, ALBUMIN in the last 168 hours. No results for input(s): LIPASE, AMYLASE in the last 168 hours. No results for input(s): AMMONIA in the last 168 hours. Coagulation Profile: No results for input(s): INR, PROTIME in the last 168 hours. Cardiac Enzymes: No results for input(s): CKTOTAL, CKMB, CKMBINDEX, TROPONINI in the last 168 hours. BNP (last 3 results) No results for input(s): PROBNP in the last 8760 hours. HbA1C: No results for  input(s): HGBA1C in the last 72 hours. CBG: No results for input(s): GLUCAP in the last 168 hours. Lipid Profile: No results for input(s): CHOL, HDL, LDLCALC, TRIG, CHOLHDL, LDLDIRECT in the last 72 hours. Thyroid Function Tests: No results for input(s): TSH, T4TOTAL, FREET4, T3FREE, THYROIDAB in the last 72 hours. Anemia Panel: No results for input(s): VITAMINB12, FOLATE, FERRITIN, TIBC, IRON, RETICCTPCT in the last 72 hours. Urine analysis:    Component Value Date/Time   COLORURINE YELLOW 01/25/2019 0026   APPEARANCEUR HAZY (A) 01/25/2019 0026   LABSPEC 1.017 01/25/2019 0026   PHURINE 5.0 01/25/2019 0026   GLUCOSEU NEGATIVE 01/25/2019 0026   HGBUR SMALL (A) 01/25/2019 0026   BILIRUBINUR NEGATIVE 01/25/2019 0026   KETONESUR 5 (A) 01/25/2019 0026   PROTEINUR 30 (A) 01/25/2019 0026  UROBILINOGEN 0.2 01/14/2010 1406   NITRITE POSITIVE (A) 01/25/2019 0026   LEUKOCYTESUR TRACE (A) 01/25/2019 0026    Radiological Exams on Admission: Dg Chest 2 View  Result Date: 01/26/2019 CLINICAL DATA:  85 y/o  F; fall prior to admission. Chest pain. EXAM: CHEST - 2 VIEW COMPARISON:  09/06/2018 CT chest. FINDINGS: Normal cardiac silhouette given projection and technique. Aortic atherosclerosis with calcification. Peripheral basilar pulmonary fibrosis. No consolidation. No pleural effusion or pneumothorax. Bones are demineralized. No acute osseous abnormality is evident. Degenerative changes of the right shoulder joint with superior migration of humerus. IMPRESSION: No acute process identified. Electronically Signed   By: Kristine Garbe M.D.   On: 01/26/2019 00:12   Ct Head Wo Contrast  Result Date: 01/25/2019 CLINICAL DATA:  85 year old female with fall EXAM: CT HEAD WITHOUT CONTRAST CT MAXILLOFACIAL WITHOUT CONTRAST CT CERVICAL SPINE WITHOUT CONTRAST TECHNIQUE: Multidetector CT imaging of the head, cervical spine, and maxillofacial structures were performed using the standard protocol  without intravenous contrast. Multiplanar CT image reconstructions of the cervical spine and maxillofacial structures were also generated. COMPARISON:  None. FINDINGS: CT HEAD FINDINGS Brain: No acute intracranial hemorrhage. No midline shift or mass effect. Gray-white differentiation maintained. Unremarkable appearance of the ventricular system. Volume loss. Senescent calcifications of the basal ganglia Vascular: Intracranial calcifications Skull: No acute fracture.  No aggressive bone lesion identified. Sinuses/Orbits: Unremarkable appearance of the orbits. Mastoid air cells clear. No middle ear effusion. No significant sinus disease. Other: None CT MAXILLOFACIAL FINDINGS Osseous: No mandibular fracture. TMJ aligned bilaterally. No maxillary fracture. Multiple absent teeth with dental amalgams. Dental caries. No ethmoid fracture. No zygomatic fracture. Unremarkable nasal bones. Rightward nasal spur without fracture. Orbits: Unremarkable orbits. Sinuses: Unremarkable sinuses. Soft tissue defect of the anterior nasal septum. Soft tissues: Focal soft tissue swelling lateral to the left orbit on the zygomatic bone. No radiopaque foreign body. CT CERVICAL SPINE FINDINGS Alignment: Craniocervical junction aligned. Anatomic alignment of the cervical elements. No subluxation. Skull base and vertebrae: No acute fracture at the skullbase. Vertebral body heights relatively maintained. No acute fracture identified. Soft tissues and spinal canal: Dense calcifications of the carotid arteries. No lymphadenopathy. No bony canal narrowing or canal hematoma. Disc levels: Degenerative disc disease present throughout the cervical spine, worst at the C3-C6 levels. Varying degrees bilateral neural foraminal narrowing secondary to uncovertebral joint disease and facet disease. Upper chest: Scarring/atelectasis at the lung apices. Other: None IMPRESSION: Head CT: No acute intracranial abnormality. Maxillofacial CT: No acute fracture.  Soft tissue defect of the anterior nasal septum, typically seen with longstanding use vaso constrictive substances. Cervical CT: No acute fracture or malalignment of the cervical spine. Multilevel degenerative changes. Advanced calcifications of the bilateral carotid arteries. Electronically Signed   By: Corrie Mckusick D.O.   On: 01/25/2019 21:31   Ct Cervical Spine Wo Contrast  Result Date: 01/25/2019 CLINICAL DATA:  85 year old female with fall EXAM: CT HEAD WITHOUT CONTRAST CT MAXILLOFACIAL WITHOUT CONTRAST CT CERVICAL SPINE WITHOUT CONTRAST TECHNIQUE: Multidetector CT imaging of the head, cervical spine, and maxillofacial structures were performed using the standard protocol without intravenous contrast. Multiplanar CT image reconstructions of the cervical spine and maxillofacial structures were also generated. COMPARISON:  None. FINDINGS: CT HEAD FINDINGS Brain: No acute intracranial hemorrhage. No midline shift or mass effect. Gray-white differentiation maintained. Unremarkable appearance of the ventricular system. Volume loss. Senescent calcifications of the basal ganglia Vascular: Intracranial calcifications Skull: No acute fracture.  No aggressive bone lesion identified. Sinuses/Orbits: Unremarkable appearance of the orbits.  Mastoid air cells clear. No middle ear effusion. No significant sinus disease. Other: None CT MAXILLOFACIAL FINDINGS Osseous: No mandibular fracture. TMJ aligned bilaterally. No maxillary fracture. Multiple absent teeth with dental amalgams. Dental caries. No ethmoid fracture. No zygomatic fracture. Unremarkable nasal bones. Rightward nasal spur without fracture. Orbits: Unremarkable orbits. Sinuses: Unremarkable sinuses. Soft tissue defect of the anterior nasal septum. Soft tissues: Focal soft tissue swelling lateral to the left orbit on the zygomatic bone. No radiopaque foreign body. CT CERVICAL SPINE FINDINGS Alignment: Craniocervical junction aligned. Anatomic alignment of the  cervical elements. No subluxation. Skull base and vertebrae: No acute fracture at the skullbase. Vertebral body heights relatively maintained. No acute fracture identified. Soft tissues and spinal canal: Dense calcifications of the carotid arteries. No lymphadenopathy. No bony canal narrowing or canal hematoma. Disc levels: Degenerative disc disease present throughout the cervical spine, worst at the C3-C6 levels. Varying degrees bilateral neural foraminal narrowing secondary to uncovertebral joint disease and facet disease. Upper chest: Scarring/atelectasis at the lung apices. Other: None IMPRESSION: Head CT: No acute intracranial abnormality. Maxillofacial CT: No acute fracture. Soft tissue defect of the anterior nasal septum, typically seen with longstanding use vaso constrictive substances. Cervical CT: No acute fracture or malalignment of the cervical spine. Multilevel degenerative changes. Advanced calcifications of the bilateral carotid arteries. Electronically Signed   By: Corrie Mckusick D.O.   On: 01/25/2019 21:31   Dg Knee Complete 4 Views Left  Result Date: 01/26/2019 CLINICAL DATA:  85 y/o F; fall at home. Hematoma to left hip and left knee. EXAM: LEFT KNEE - COMPLETE 4+ VIEW COMPARISON:  02/10/2018 left lower extremity radiograph FINDINGS: Bones are demineralized. Total knee arthroplasty. No periprosthetic lucency or fracture identified. No malalignment. Vascular calcifications noted. No joint effusion. IMPRESSION: Total knee arthroplasty. No acute fracture or dislocation identified. Electronically Signed   By: Kristine Garbe M.D.   On: 01/26/2019 00:17   Dg Hips Bilat W Or Wo Pelvis 5 Views  Result Date: 01/26/2019 CLINICAL DATA:  85 y/o F; fall at home. Hematoma to the left hip and left knee. EXAM: DG HIP (WITH OR WITHOUT PELVIS) 5+V BILAT COMPARISON:  02/10/2018 hip and pelvis radiographs. FINDINGS: There is no evidence of hip fracture or dislocation. Bones are demineralized. There  is no evidence of arthropathy or other focal bone abnormality. IMPRESSION: No acute fracture or dislocation identified. If suspicion for fracture persists, consider hip MRI or hip CT if MRI is contraindicated/unavailable for further evaluation. Electronically Signed   By: Kristine Garbe M.D.   On: 01/26/2019 00:16   Ct Maxillofacial Wo Cm  Result Date: 01/25/2019 CLINICAL DATA:  85 year old female with fall EXAM: CT HEAD WITHOUT CONTRAST CT MAXILLOFACIAL WITHOUT CONTRAST CT CERVICAL SPINE WITHOUT CONTRAST TECHNIQUE: Multidetector CT imaging of the head, cervical spine, and maxillofacial structures were performed using the standard protocol without intravenous contrast. Multiplanar CT image reconstructions of the cervical spine and maxillofacial structures were also generated. COMPARISON:  None. FINDINGS: CT HEAD FINDINGS Brain: No acute intracranial hemorrhage. No midline shift or mass effect. Gray-white differentiation maintained. Unremarkable appearance of the ventricular system. Volume loss. Senescent calcifications of the basal ganglia Vascular: Intracranial calcifications Skull: No acute fracture.  No aggressive bone lesion identified. Sinuses/Orbits: Unremarkable appearance of the orbits. Mastoid air cells clear. No middle ear effusion. No significant sinus disease. Other: None CT MAXILLOFACIAL FINDINGS Osseous: No mandibular fracture. TMJ aligned bilaterally. No maxillary fracture. Multiple absent teeth with dental amalgams. Dental caries. No ethmoid fracture. No zygomatic fracture. Unremarkable  nasal bones. Rightward nasal spur without fracture. Orbits: Unremarkable orbits. Sinuses: Unremarkable sinuses. Soft tissue defect of the anterior nasal septum. Soft tissues: Focal soft tissue swelling lateral to the left orbit on the zygomatic bone. No radiopaque foreign body. CT CERVICAL SPINE FINDINGS Alignment: Craniocervical junction aligned. Anatomic alignment of the cervical elements. No  subluxation. Skull base and vertebrae: No acute fracture at the skullbase. Vertebral body heights relatively maintained. No acute fracture identified. Soft tissues and spinal canal: Dense calcifications of the carotid arteries. No lymphadenopathy. No bony canal narrowing or canal hematoma. Disc levels: Degenerative disc disease present throughout the cervical spine, worst at the C3-C6 levels. Varying degrees bilateral neural foraminal narrowing secondary to uncovertebral joint disease and facet disease. Upper chest: Scarring/atelectasis at the lung apices. Other: None IMPRESSION: Head CT: No acute intracranial abnormality. Maxillofacial CT: No acute fracture. Soft tissue defect of the anterior nasal septum, typically seen with longstanding use vaso constrictive substances. Cervical CT: No acute fracture or malalignment of the cervical spine. Multilevel degenerative changes. Advanced calcifications of the bilateral carotid arteries. Electronically Signed   By: Corrie Mckusick D.O.   On: 01/25/2019 21:31    EKG: Independently reviewed.  Assessment/Plan Principal Problem:   Acute encephalopathy Active Problems:   Hypothyroidism   Essential hypertension   PAF (paroxysmal atrial fibrillation) ([place])   Falls    1. Acute encephalopathy - likely with underlying chronic dementia component vs just all severe chronic dementia 1. MRI brain to r/o stroke given h/o PAF on eliquis 2. Treat UTI 3. Check TSH 2. UTI - 1. Rocephin 2. UCx pending 3. Repeat CBC in AM 3. Hypothyroidism - 1. Cont synthroid 2. Check TSH 4. HTN - 1. Resume home metoprolol, first dose now 5. PAF - 1. Cont metoprolol 6. Recurrent falls - 1. PT/OT eval and treat 2. CW and SW consults 7. Sacral decubitus - 1. Wound care consult  DVT prophylaxis: Lovenox Code Status: Full Family Communication: No family in room Disposition Plan: TBD Consults called: None Admission status: Admit to inpatient  Severity of Illness: The  appropriate patient status for this patient is INPATIENT. Inpatient status is judged to be reasonable and necessary in order to provide the required intensity of service to ensure the patient's safety. The patient's presenting symptoms, physical exam findings, and initial radiographic and laboratory data in the context of their chronic comorbidities is felt to place them at high risk for further clinical deterioration. Furthermore, it is not anticipated that the patient will be medically stable for discharge from the hospital within 2 midnights of admission. The following factors support the patient status of inpatient.   " The patient's presenting symptoms include falls, confusion, AMS, in ability to ambulate safely at home. " The worrisome physical exam findings include hematomas, generalized bruising. " The initial radiographic and laboratory data are worrisome because of mild AKI, UTI, WBC 22k, HR 110s. " The chronic co-morbidities include PAF, HTN.   * I certify that at the point of admission it is my clinical judgment that the patient will require inpatient hospital care spanning beyond 2 midnights from the point of admission due to high intensity of service, high risk for further deterioration and high frequency of surveillance required.*    Jhair Witherington M. DO [facility]  How to contact the [facility] Attending or Consulting provider [place] or covering provider during after hours [place], for this patient?  1. Check the care team in [facility] and look for a) attending/consulting [facility]  provider listed and b) the [facility] team listed 2. Log into [email or web address]  Amion Physician Scheduling and messaging for groups and whole hospitals  On call and physician scheduling software for group practices, residents, hospitalists and other medical providers for call, clinic, rotation and shift schedules. On[employer] is a hospital-wide system for scheduling doctors and paging doctors on call. EasyPlot is for  scientific plotting and data analysis.  [email or web address]  and use [place]'s universal password to access. If you do not have the password, please contact the hospital operator.  3. Locate the [facility] provider you are looking for under [facility] and page to a number that you can be directly reached. 4. If you still have difficulty reaching the provider, please page the [facility] (Director on Call) for the Hospitalists listed on amion for assistance.  01/26/2019, 2:25 AM

## 2019-01-26 NOTE — Evaluation (Addendum)
Physical Therapy Evaluation Patient Details Name: Dominique [name] MRN: [id number] DOB: [birth year] Today's Date: 01/26/2019   History of Present Illness  85 y.o. female with medical history significant of HTN, PAF on eliquis, hypothyroidism. Pt admitted with a fall, UTI, sacral wound, confusion.   Clinical Impression  Pt admitted with above diagnosis. Pt currently with functional limitations due to the deficits listed below (see PT Problem List). Min assist for bed mobility and for transfers. Did not attempt ambulation 2* pt fatigue with transfers. Due to h/o falls and confusion, 24* supervision and hands on assist for mobility recommended. Due to pt's confusion, it is unclear if she has assistance available at home. She stated she lives with her son who travels frequently for work.  Pt will benefit from skilled PT to increase their independence and safety with mobility to allow discharge to the venue listed below.       Follow Up Recommendations SNF; vs HHPT (depending on family's ability to provide 24* supervision) Supervision/Assistance - 24 hour;Supervision for mobility/OOB    Equipment Recommendations  Rolling walker with 5" wheels;3in1 (PT)    Recommendations for Other Services       Precautions / Restrictions Precautions Precautions: Fall Precaution Comments: fall just PTA, per chart multiple falls Restrictions Weight Bearing Restrictions: No      Mobility  Bed Mobility Overal bed mobility: Needs Assistance Bed Mobility: Rolling;Sidelying to Sit Rolling: Min assist Sidelying to sit: Min assist          Transfers Overall transfer level: Needs assistance Equipment used: 1 person hand held assist Transfers: Sit to/from [facility] Sit to Stand: Min assist Stand pivot transfers: Min assist       General transfer comment: min assist to rise and steady: SPT x 2 (bed to 3 in 1, then to recliner), VCs for hand placement, mild  unsteadiness  Ambulation/Gait             General Gait Details: deferred 2* fatigue with SPT  Stairs            Wheelchair Mobility    Modified Rankin (Stroke Patients Only)       Balance Overall balance assessment: Needs assistance Sitting-balance support: Feet supported Sitting balance-Leahy Scale: Good     Standing balance support: Single extremity supported Standing balance-Leahy Scale: Poor Standing balance comment: relies on single UE support in standing                             Pertinent Vitals/Pain Pain Assessment: No/denies pain    Home Living Family/patient expects to be discharged to:: Skilled nursing facility Living Arrangements: Children(pt stated she lives with son, will need to confirm this) Available Help at Discharge: Family;Available PRN/intermittently   Home Access: Stairs to enter   Entrance Stairs-Number of Steps: 3 Home Layout: One level Home Equipment: Walker - 2 wheels;Cane - single point;Grab bars - tub/shower      Prior Function Level of Independence: Independent               Hand Dominance        Extremity/Trunk Assessment   Upper Extremity Assessment Upper Extremity Assessment: Generalized weakness    Lower Extremity Assessment Lower Extremity Assessment: Generalized weakness(B knee extension -4/5)    Cervical / Trunk Assessment Cervical / Trunk Assessment: Normal  Communication   Communication: No difficulties  Cognition Arousal/Alertness: Awake/alert Behavior During Therapy: WFL for tasks assessed/performed Overall Cognitive Status: No  family/caregiver present to determine baseline cognitive functioning                                 General Comments: per chart pt has memory deficits, pt oriented to self, not to month, knows she's in hospital but thinks she's at [place]     Assessment/Plan    PT Assessment Patient needs  continued PT services  PT Problem List Decreased activity tolerance;Decreased strength;Decreased balance;Decreased mobility;Decreased cognition       PT Treatment Interventions Gait training;Therapeutic exercise;Therapeutic activities;Functional mobility training;Patient/family education    PT Goals (Current goals can be found in the Care Plan section)  Acute Rehab PT Goals Patient Stated Goal: none stated PT Goal Formulation: Patient unable to participate in goal setting Time For Goal Achievement: 02/09/19 Potential to Achieve Goals: Fair    Frequency Min 2X/week   Barriers to discharge        Co-evaluation PT/OT/SLP Co-Evaluation/Treatment: Yes Reason for Co-Treatment: Complexity of the patient's impairments (multi-system involvement);For patient/therapist safety;To address functional/ADL transfers PT goals addressed during session: Mobility/safety with mobility;Balance         AM-PAC PT "6 Clicks" Mobility  Outcome Measure Help needed turning from your back to your side while in a flat bed without using bedrails?: A Little Help needed moving from lying on your back to sitting on the side of a flat bed without using bedrails?: A Little Help needed moving to and from a bed to a chair (including a wheelchair)?: A Little Help needed standing up from a chair using your arms (e.g., wheelchair or bedside chair)?: A Little Help needed to walk in hospital room?: A Lot Help needed climbing 3-5 steps with a railing? : Total 6 Click Score: 15    End of Session Equipment Utilized During Treatment: Gait belt Activity Tolerance: Patient tolerated treatment well;No increased pain Patient left: in chair;with call bell/phone within reach;with chair alarm set Nurse Communication: Mobility status PT Visit Diagnosis: Unsteadiness on feet (R26.81);History of falling (Z91.81);Muscle weakness (generalized) (M62.81);Difficulty in walking, not elsewhere classified (R26.2)    Time:  2637-8588 PT Time Calculation (min) (ACUTE ONLY): 33 min   Charges:   PT Evaluation $PT Eval Moderate Complexity: 1 Mod         Philomena Doheny PT 01/26/2019  Acute Rehabilitation Services Pager [phone] Office [phone]

## 2019-01-26 NOTE — Consult Note (Signed)
[facility] Nurse wound consult note Patient receiving care in [facility] 5M17.  I have reviewed the patient's H&P as well as the photos in the EMR.  I spoke with her primary RN, Lanelle Bal, via telephone.  She has agreed to measure the length and width of the sacral and left lateral knee wounds and place those measurements in the record. Reason for Consult: "decubitus" Wound type: Left lateral knee and sacral DTPIs versus hematomas.  The sacral wound appears more consistent with DTPI with some peripheral superficial tissue loss.  The patient is incontinent of urine. A PureWick is in use. Pressure Injury POA: Yes Measurement: To be entered by Kennyth Lose, RN Wound bed: dark purple for both Drainage (amount, consistency, odor) none Periwound: Fragile Dressing procedure/placement/frequency: Foam dressings to these areas, change every 3 days and prn. Avoid extended pressure to the sites. Monitor the wound area(s) for worsening of condition such as: Signs/symptoms of infection,  Increase in size,  Development of or worsening of odor, Development of pain, or increased pain at the affected locations.  Notify the medical team if any of these develop.  Thank you for the consult.  Discussed plan of care with the bedside nurse.  [facility] nurse will not follow at this time.  Please re-consult the [place] team if needed.  Val Riles, RN, MSN, CWOCN, CNS-BC, pager [phone]

## 2019-01-27 ENCOUNTER — Inpatient Hospital Stay (HOSPITAL_COMMUNITY): Payer: Medicare Other

## 2019-01-27 DIAGNOSIS — W19XXXD Unspecified fall, subsequent encounter: Secondary | ICD-10-CM

## 2019-01-27 DIAGNOSIS — B9620 Unspecified Escherichia coli [E. coli] as the cause of diseases classified elsewhere: Secondary | ICD-10-CM

## 2019-01-27 DIAGNOSIS — L8990 Pressure ulcer of unspecified site, unspecified stage: Secondary | ICD-10-CM

## 2019-01-27 DIAGNOSIS — N390 Urinary tract infection, site not specified: Principal | ICD-10-CM

## 2019-01-27 DIAGNOSIS — R29898 Other symptoms and signs involving the musculoskeletal system: Secondary | ICD-10-CM

## 2019-01-27 DIAGNOSIS — S31000A Unspecified open wound of lower back and pelvis without penetration into retroperitoneum, initial encounter: Secondary | ICD-10-CM

## 2019-01-27 DIAGNOSIS — E876 Hypokalemia: Secondary | ICD-10-CM

## 2019-01-27 LAB — BASIC METABOLIC PANEL
Anion gap: 7 (ref 5–15)
BUN: 20 mg/dL (ref 8–23)
CO2: 22 mmol/L (ref 22–32)
Calcium: 8 mg/dL — ABNORMAL LOW (ref 8.9–10.3)
Chloride: 108 mmol/L (ref 98–111)
Creatinine, Ser: 0.6 mg/dL (ref 0.44–1.00)
GFR calc Af Amer: >60 mL/min (ref >60)
GFR calc non Af Amer: >60 mL/min (ref >60)
Glucose, Bld: 101 mg/dL — ABNORMAL HIGH (ref 70–99)
Potassium: 2.9 mmol/L — ABNORMAL LOW (ref 3.5–5.1)
Sodium: 137 mmol/L (ref 135–145)

## 2019-01-27 LAB — CBC WITH DIFFERENTIAL/PLATELET
Abs Immature Granulocytes: 0.04 10*3/uL (ref 0.00–0.07)
Basophils Absolute: 0 10*3/uL (ref 0.0–0.1)
Basophils Relative: 0 %
Eosinophils Absolute: 0.1 10*3/uL (ref 0.0–0.5)
Eosinophils Relative: 1 %
HCT: 28.6 % — ABNORMAL LOW (ref 36.0–46.0)
Hemoglobin: 9.4 g/dL — ABNORMAL LOW (ref 12.0–15.0)
Immature Granulocytes: 0 %
Lymphocytes Relative: 27 %
Lymphs Abs: 2.8 10*3/uL (ref 0.7–4.0)
MCH: 32.4 pg (ref 26.0–34.0)
MCHC: 32.9 g/dL (ref 30.0–36.0)
MCV: 98.6 fL (ref 80.0–100.0)
Monocytes Absolute: 0.8 10*3/uL (ref 0.1–1.0)
Monocytes Relative: 8 %
Neutro Abs: 6.6 10*3/uL (ref 1.7–7.7)
Neutrophils Relative %: 64 %
Platelets: 457 10*3/uL — ABNORMAL HIGH (ref 150–400)
RBC: 2.9 MIL/uL — ABNORMAL LOW (ref 3.87–5.11)
RDW: 14.1 % (ref 11.5–15.5)
WBC: 10.4 10*3/uL (ref 4.0–10.5)
nRBC: 0 % (ref 0.0–0.2)

## 2019-01-27 LAB — URINE CULTURE: Culture: >=100000 — AB

## 2019-01-27 LAB — RPR: RPR Ser Ql: NONREACTIVE

## 2019-01-27 LAB — FOLATE RBC
Folate, Hemolysate: 387 ng/mL
Folate, RBC: 1240 ng/mL (ref >498)
Hematocrit: 31.2 % — ABNORMAL LOW (ref 34.0–46.6)

## 2019-01-27 LAB — MAGNESIUM: Magnesium: 1.8 mg/dL (ref 1.7–2.4)

## 2019-01-27 MED ORDER — ADULT MULTIVITAMIN W/MINERALS CH
1.0000 | ORAL_TABLET | Freq: Every day | ORAL | Status: DC
  Administered 2019-01-27 – 2019-01-30 (×4): 1 via ORAL
  Filled 2019-01-27 (×4): qty 1

## 2019-01-27 MED ORDER — MAGNESIUM SULFATE 2 GM/50ML IV SOLN
2.0000 g | Freq: Once | INTRAVENOUS | Status: AC
  Administered 2019-01-27: 2 g via INTRAVENOUS
  Filled 2019-01-27: qty 50

## 2019-01-27 MED ORDER — ENSURE ENLIVE PO LIQD
237.0000 mL | Freq: Two times a day (BID) | ORAL | Status: DC
  Administered 2019-01-27 – 2019-01-30 (×6): 237 mL via ORAL

## 2019-01-27 MED ORDER — POTASSIUM CHLORIDE CRYS ER 20 MEQ PO TBCR
40.0000 meq | EXTENDED_RELEASE_TABLET | ORAL | Status: AC
  Administered 2019-01-27 (×2): 40 meq via ORAL
  Filled 2019-01-27 (×2): qty 2

## 2019-01-27 NOTE — Progress Notes (Signed)
PROGRESS NOTE    Dominique [name]  ZTI:[id number] DOB: 02/17/1933 DOA: 01/25/2019 PCP: [name], MD    Brief Narrative:  HPI per Dr. Elinor Parkinson [name] is a 85 y.o. female with medical history significant of HTN, PAF on eliquis, hypothyroidism.  Patient brought in to ED after EMS called by patients son.  Son reports that he attempted to call his mother at 20 earlier today. He reports that he called numerous times and after not receiving an answer he went over to her house at approximately 1800 and found her laying on the floor next to her bed.  Unsure of what time patient actually initially fell.  Patient is quite confused in triage, also unclear how many falls over past few days she has had.  Turns out she has had episodic confusion since Christmas per son.  Today for example patient reports that she lives at home with her husband. However, when I asked the patient's son he reports that the patient's husband passed away several years ago.  Son reports reports that the patient became very confused just after Christmas and called him crying and stating that she was unable to find her husband. He reports that he took her to his PCP and she was started on donezepil as there was suspected to be some underlying dementia, but she has not further been worked up for these complaints. He reports that she has had worsening confusion after previous falls. He reports that the number of falls that she has had has significantly increased over the last year.   ED Course: Tm 99.1, HR 110s, no fracture findings on numerous imaging studies.  CT head neg.  WBC 22k, UA suspicious for possible UTI.  Assessment & Plan:   Principal Problem:   Acute encephalopathy Active Problems:   E. coli UTI   Hypothyroidism   Essential hypertension   PAF (paroxysmal atrial fibrillation) (HCC)   Falls   Pressure injury of skin   Hypokalemia   Weakness of right upper extremity   Weakness of right  lower extremity  1 acute metabolic encephalopathy  Secondary to E. coli UTI in the setting of underlying chronic dementia and probable dehydration.  Urinalysis done on admission concerning for UTI.  Urine cultures with greater than 100,000 colonies of E. coli.  MRI brain negative for any acute abnormalities.  TSH within normal limits.  Chest x-ray negative for any acute infiltrate.  Patient improving clinically.  Continue empiric IV Rocephin and likely transition to oral antibiotics if continued improvement tomorrow.  Continue gentle hydration with IV fluids.  Supportive care.  Follow.  2.  E. coli UTI Continue IV Rocephin.  If continued improvement likely transition to oral antibiotics tomorrow.  3.  Hypothyroidism TSH within normal limits.  Continue home dose Synthroid.  4.  Right upper extremity weakness/right lower extremity weakness Likely secondary to fall.  Patient unable to raise right upper extremity above her head/shoulder with complaints of pain in the right shoulder.  Patient noted to have a bruise on the right shoulder.  Patient with some right lower extremity weakness compared to the left lower extremity.  Concern for possible fracture versus rotator cuff injury.  Get plain films of the right shoulder, right upper extremity.  Patient had plain films of the bilateral hips and pelvis done on admission which was unremarkable.  Consulted orthopedics for further evaluation and management.  PT/OT.  Pain management.  5.  Paroxysmal atrial fibrillation Currently rate controlled on Lopressor.  Per  admitting MD and per patient patient has been having increasing falls since Christmas of 2019.  Patient presented after she was found on the floor and noted to have some periorbital ecchymosis/hematoma.  Patient with underlying history of chronic dementia.  Patient likely not a suitable candidate for ongoing anticoagulation in light of increasing number of falls.  Will discontinue Eliquis at this time  and placed on full dose aspirin.  Will need outpatient follow-up with her cardiologist post discharge.  6.  Recurrent Falls Questionable etiology.  May be secondary to UTI and dehydration.  PT/OT.  7.  Dehydration IV fluids.  8.  Hypokalemia Replete.  Keep magnesium greater than 2.  9.  Hyperlipidemia Continue statin.  10.  Sacral decubitus, POA Wound care consulted.  Continue current wound care as recommended.   DVT prophylaxis: Lovenox Code Status: Full Family Communication: Updated patient.  No family at bedside. Disposition Plan: Skilled nursing facility versus home with home health.   Consultants:   Orthopedics pending  Procedures:   MRI head 01/26/2019  CT head CT C-spine, CT maxillofacial 01/25/2019  Plain films of bilateral hips and pelvis 01/25/2019  Chest x-ray 01/25/2019, 01/27/2019  Plain films of the right knee 01/25/2019  Antimicrobials:   IV Rocephin 01/26/2019   Subjective: Patient laying in bed.  Awake alert.  Following commands appropriately.  Denies any chest pain or shortness of breath.  States dysuria has improved since admission.  Tolerating oral intake.  Per RN patient with right upper extremity and right lower extremity weakness with inability to left right upper extremity above the head without use of the left hand.  Objective: Vitals:   01/26/19 1741 01/26/19 2019 01/27/19 0521 01/27/19 0856  BP: (!) 150/71 124/73 (!) 161/66 (!) 127/58  Pulse: 76 71 76 80  Resp: 16 20 18 18   Temp: 98.7 F (37.1 C) 98 F (36.7 C) 98.5 F (36.9 C) 98.1 F (36.7 C)  TempSrc: Oral Oral Oral Oral  SpO2: 95% 94% 97% 98%  Weight:  47.9 kg    Height:        Intake/Output Summary (Last 24 hours) at 01/27/2019 1444 Last data filed at 01/27/2019 0930 Gross per 24 hour  Intake 1412.82 ml  Output 950 ml  Net 462.82 ml   Filed Weights   01/26/19 1400 01/26/19 2019  Weight: 47.2 kg 47.9 kg    Examination:  General exam: Appears calm and comfortable   Respiratory system: Crackles noted in the bases bilaterally.  No wheezing, no rhonchi.  Speaking in full sentences.  Normal respiratory effort.  Cardiovascular system: S1 & S2 heard, RRR. No JVD, murmurs, rubs, gallops or clicks. No pedal edema. Gastrointestinal system: Abdomen is nondistended, soft and nontender. No organomegaly or masses felt. Normal bowel sounds heard. Central nervous system: Alert and oriented. No focal neurological deficits. Extremities: Right upper extremity weakness with inability to raise right upper extremity above the shoulder with pain in the right shoulder.  Right lower extremity weakness.  Skin: Ecchymosis noted on the left orbital region.  Ecchymosis/bruise noted on right shoulder.  Deep tissue injury on sacrum. Psychiatry: Judgement and insight appear fair. Mood & affect appropriate.     Data Reviewed: I have personally reviewed following labs and imaging studies  CBC: Recent Labs  Lab 01/25/19 2030 01/26/19 0145 01/26/19 0935 01/27/19 0752  WBC 22.3* 20.0*  --  10.4  NEUTROABS  --   --   --  6.6  HGB 11.3* 10.4*  --  9.4*  HCT  34.0* 31.3* 31.2* 28.6*  MCV 96.0 95.7  --  98.6  PLT 587* 532*  --  573*   Basic Metabolic Panel: Recent Labs  Lab 01/25/19 2030 01/26/19 0145 01/27/19 0752  NA 132* 134* 137  K 3.4* 3.5 2.9*  CL 94* 97* 108  CO2 24 23 22   GLUCOSE 115* 103* 101*  BUN 33* 34* 20  CREATININE 1.10* 1.14* 0.60  CALCIUM 9.4 9.0 8.0*  MG  --   --  1.8   GFR: Estimated Creatinine Clearance: 38.3 mL/min (by C-G formula based on SCr of 0.6 mg/dL). Liver Function Tests: No results for input(s): AST, ALT, ALKPHOS, BILITOT, PROT, ALBUMIN in the last 168 hours. No results for input(s): LIPASE, AMYLASE in the last 168 hours. No results for input(s): AMMONIA in the last 168 hours. Coagulation Profile: No results for input(s): INR, PROTIME in the last 168 hours. Cardiac Enzymes: Recent Labs  Lab 01/25/19 2321  CKTOTAL 179   BNP (last 3  results) No results for input(s): PROBNP in the last 8760 hours. HbA1C: No results for input(s): HGBA1C in the last 72 hours. CBG: No results for input(s): GLUCAP in the last 168 hours. Lipid Profile: No results for input(s): CHOL, HDL, LDLCALC, TRIG, CHOLHDL, LDLDIRECT in the last 72 hours. Thyroid Function Tests: Recent Labs    01/26/19 0233  TSH 1.897   Anemia Panel: Recent Labs    01/26/19 0935  VITAMINB12 1,418*   Sepsis Labs: No results for input(s): PROCALCITON, LATICACIDVEN in the last 168 hours.  Recent Results (from the past 240 hour(s))  Urine culture     Status: Abnormal   Collection Time: 01/25/19 11:57 PM  Result Value Ref Range Status   Specimen Description URINE, RANDOM  Final   Special Requests   Final    NONE Performed at [facility], [address] [place], [place] [address]    Culture >=100,000 COLONIES/mL ESCHERICHIA COLI (A)  Final   Report Status 01/27/2019 FINAL  Final   Organism ID, Bacteria ESCHERICHIA COLI (A)  Final      Susceptibility   Escherichia coli - MIC*    AMPICILLIN 16 INTERMEDIATE Intermediate     CEFAZOLIN <=4 SENSITIVE Sensitive     CEFTRIAXONE <=1 SENSITIVE Sensitive     CIPROFLOXACIN <=0.25 SENSITIVE Sensitive     GENTAMICIN <=1 SENSITIVE Sensitive     IMIPENEM <=0.25 SENSITIVE Sensitive     NITROFURANTOIN <=16 SENSITIVE Sensitive     TRIMETH/SULFA <=20 SENSITIVE Sensitive     AMPICILLIN/SULBACTAM 4 SENSITIVE Sensitive     PIP/TAZO <=4 SENSITIVE Sensitive     Extended ESBL NEGATIVE Sensitive     * >=100,000 COLONIES/mL ESCHERICHIA COLI  Culture, blood (Routine X 2) w Reflex to ID Panel     Status: None (Preliminary result)   Collection Time: 01/26/19  9:32 AM  Result Value Ref Range Status   Specimen Description BLOOD LEFT ANTECUBITAL  Final   Special Requests   Final    BOTTLES DRAWN AEROBIC ONLY Blood Culture adequate volume   Culture   Final    NO GROWTH < 24 HOURS Performed at [facility], [place] [address] [place], [place] [address]    Report Status PENDING  Incomplete  Culture, blood (Routine X 2) w Reflex to ID Panel     Status: None (Preliminary result)   Collection Time: 01/26/19  9:40 AM  Result Value Ref Range Status   Specimen Description BLOOD RIGHT HAND  Final  Special Requests   Final    BOTTLES DRAWN AEROBIC ONLY Blood Culture results may not be optimal due to an inadequate volume of blood received in culture bottles   Culture   Final    NO GROWTH < 24 HOURS Performed at [facility] [address] [place], [place] [address]    Report Status PENDING  Incomplete         Radiology Studies: Dg Chest 2 View  Result Date: 01/26/2019 CLINICAL DATA:  85 y/o  F; fall prior to admission. Chest pain. EXAM: CHEST - 2 VIEW COMPARISON:  09/06/2018 CT chest. FINDINGS: Normal cardiac silhouette given projection and technique. Aortic atherosclerosis with calcification. Peripheral basilar pulmonary fibrosis. No consolidation. No pleural effusion or pneumothorax. Bones are demineralized. No acute osseous abnormality is evident. Degenerative changes of the right shoulder joint with superior migration of humerus. IMPRESSION: No acute process identified. Electronically Signed   By: Kristine Garbe M.D.   On: 01/26/2019 00:12   Ct Head Wo Contrast  Result Date: 01/25/2019 CLINICAL DATA:  85 year old female with fall EXAM: CT HEAD WITHOUT CONTRAST CT MAXILLOFACIAL WITHOUT CONTRAST CT CERVICAL SPINE WITHOUT CONTRAST TECHNIQUE: Multidetector CT imaging of the head, cervical spine, and maxillofacial structures were performed using the standard protocol without intravenous contrast. Multiplanar CT image reconstructions of the cervical spine and maxillofacial structures were also generated. COMPARISON:  None. FINDINGS: CT HEAD FINDINGS Brain: No acute intracranial hemorrhage. No midline shift or mass effect. Gray-white differentiation maintained. Unremarkable appearance of the  ventricular system. Volume loss. Senescent calcifications of the basal ganglia Vascular: Intracranial calcifications Skull: No acute fracture.  No aggressive bone lesion identified. Sinuses/Orbits: Unremarkable appearance of the orbits. Mastoid air cells clear. No middle ear effusion. No significant sinus disease. Other: None CT MAXILLOFACIAL FINDINGS Osseous: No mandibular fracture. TMJ aligned bilaterally. No maxillary fracture. Multiple absent teeth with dental amalgams. Dental caries. No ethmoid fracture. No zygomatic fracture. Unremarkable nasal bones. Rightward nasal spur without fracture. Orbits: Unremarkable orbits. Sinuses: Unremarkable sinuses. Soft tissue defect of the anterior nasal septum. Soft tissues: Focal soft tissue swelling lateral to the left orbit on the zygomatic bone. No radiopaque foreign body. CT CERVICAL SPINE FINDINGS Alignment: Craniocervical junction aligned. Anatomic alignment of the cervical elements. No subluxation. Skull base and vertebrae: No acute fracture at the skullbase. Vertebral body heights relatively maintained. No acute fracture identified. Soft tissues and spinal canal: Dense calcifications of the carotid arteries. No lymphadenopathy. No bony canal narrowing or canal hematoma. Disc levels: Degenerative disc disease present throughout the cervical spine, worst at the C3-C6 levels. Varying degrees bilateral neural foraminal narrowing secondary to uncovertebral joint disease and facet disease. Upper chest: Scarring/atelectasis at the lung apices. Other: None IMPRESSION: Head CT: No acute intracranial abnormality. Maxillofacial CT: No acute fracture. Soft tissue defect of the anterior nasal septum, typically seen with longstanding use vaso constrictive substances. Cervical CT: No acute fracture or malalignment of the cervical spine. Multilevel degenerative changes. Advanced calcifications of the bilateral carotid arteries. Electronically Signed   By: Corrie Mckusick D.O.   On:  01/25/2019 21:31   Ct Cervical Spine Wo Contrast  Result Date: 01/25/2019 CLINICAL DATA:  85 year old female with fall EXAM: CT HEAD WITHOUT CONTRAST CT MAXILLOFACIAL WITHOUT CONTRAST CT CERVICAL SPINE WITHOUT CONTRAST TECHNIQUE: Multidetector CT imaging of the head, cervical spine, and maxillofacial structures were performed using the standard protocol without intravenous contrast. Multiplanar CT image reconstructions of the cervical spine and maxillofacial structures were also generated. COMPARISON:  None. FINDINGS:  CT HEAD FINDINGS Brain: No acute intracranial hemorrhage. No midline shift or mass effect. Gray-white differentiation maintained. Unremarkable appearance of the ventricular system. Volume loss. Senescent calcifications of the basal ganglia Vascular: Intracranial calcifications Skull: No acute fracture.  No aggressive bone lesion identified. Sinuses/Orbits: Unremarkable appearance of the orbits. Mastoid air cells clear. No middle ear effusion. No significant sinus disease. Other: None CT MAXILLOFACIAL FINDINGS Osseous: No mandibular fracture. TMJ aligned bilaterally. No maxillary fracture. Multiple absent teeth with dental amalgams. Dental caries. No ethmoid fracture. No zygomatic fracture. Unremarkable nasal bones. Rightward nasal spur without fracture. Orbits: Unremarkable orbits. Sinuses: Unremarkable sinuses. Soft tissue defect of the anterior nasal septum. Soft tissues: Focal soft tissue swelling lateral to the left orbit on the zygomatic bone. No radiopaque foreign body. CT CERVICAL SPINE FINDINGS Alignment: Craniocervical junction aligned. Anatomic alignment of the cervical elements. No subluxation. Skull base and vertebrae: No acute fracture at the skullbase. Vertebral body heights relatively maintained. No acute fracture identified. Soft tissues and spinal canal: Dense calcifications of the carotid arteries. No lymphadenopathy. No bony canal narrowing or canal hematoma. Disc levels:  Degenerative disc disease present throughout the cervical spine, worst at the C3-C6 levels. Varying degrees bilateral neural foraminal narrowing secondary to uncovertebral joint disease and facet disease. Upper chest: Scarring/atelectasis at the lung apices. Other: None IMPRESSION: Head CT: No acute intracranial abnormality. Maxillofacial CT: No acute fracture. Soft tissue defect of the anterior nasal septum, typically seen with longstanding use vaso constrictive substances. Cervical CT: No acute fracture or malalignment of the cervical spine. Multilevel degenerative changes. Advanced calcifications of the bilateral carotid arteries. Electronically Signed   By: Corrie Mckusick D.O.   On: 01/25/2019 21:31   Mr Brain Wo Contrast  Result Date: 01/26/2019 CLINICAL DATA:  Encephalopathy. Altered mental status. Patient found on floor yesterday afternoon. EXAM: MRI HEAD WITHOUT CONTRAST TECHNIQUE: Multiplanar, multiecho pulse sequences of the brain and surrounding structures were obtained without intravenous contrast. COMPARISON:  CT head without contrast 01/25/2019 FINDINGS: Brain: Mild atrophy and white matter changes are within normal limits for age. No acute infarct, hemorrhage, or mass lesion is present. The ventricles are of proportionate to the degree of atrophy. No significant extraaxial fluid collection is present. The internal auditory canals are within normal limits. The brainstem and cerebellum are within normal limits. Vascular: Flow is present in the major intracranial arteries. Skull and upper cervical spine: Degenerative changes are noted at C1-2. Additional degenerative changes are present in the upper cervical spine. Craniocervical junction is otherwise normal. Midline structures are within normal limits. Sinuses/Orbits: The paranasal sinuses and mastoid air cells are clear. A 10 mm hematoma is present lateral to the left orbit. Bilateral lens replacements are noted. Globes and orbits are otherwise  unremarkable. Other: IMPRESSION: 1. Normal MRI appearance of the brain for age. No acute or focal lesion to explain encephalopathy. 2. Small left periorbital hematoma without underlying fracture or globe injury. 3. Degenerative changes of the cervical spine. Electronically Signed   By: San Morelle M.D.   On: 01/26/2019 04:19   Dg Chest Port 1 View  Result Date: 01/27/2019 CLINICAL DATA:  Shortness of breath. EXAM: PORTABLE CHEST 1 VIEW COMPARISON:  Radiographs of January 25, 2019. FINDINGS: The heart size and mediastinal contours are within normal limits. No pneumothorax or pleural effusion is noted. Stable bilateral lung opacities are noted most consistent with scarring or fibrosis. No acute abnormality is noted. Atherosclerosis of thoracic aorta is noted. The visualized skeletal structures are unremarkable. IMPRESSION: No acute  cardiopulmonary abnormality seen. Aortic Atherosclerosis (ICD10-I70.0). Electronically Signed   By: Marijo Conception M.D.   On: 01/27/2019 08:20   Dg Knee Complete 4 Views Left  Result Date: 01/26/2019 CLINICAL DATA:  85 y/o F; fall at home. Hematoma to left hip and left knee. EXAM: LEFT KNEE - COMPLETE 4+ VIEW COMPARISON:  02/10/2018 left lower extremity radiograph FINDINGS: Bones are demineralized. Total knee arthroplasty. No periprosthetic lucency or fracture identified. No malalignment. Vascular calcifications noted. No joint effusion. IMPRESSION: Total knee arthroplasty. No acute fracture or dislocation identified. Electronically Signed   By: Kristine Garbe M.D.   On: 01/26/2019 00:17   Dg Hips Bilat W Or Wo Pelvis 5 Views  Result Date: 01/26/2019 CLINICAL DATA:  85 y/o F; fall at home. Hematoma to the left hip and left knee. EXAM: DG HIP (WITH OR WITHOUT PELVIS) 5+V BILAT COMPARISON:  02/10/2018 hip and pelvis radiographs. FINDINGS: There is no evidence of hip fracture or dislocation. Bones are demineralized. There is no evidence of arthropathy or other  focal bone abnormality. IMPRESSION: No acute fracture or dislocation identified. If suspicion for fracture persists, consider hip MRI or hip CT if MRI is contraindicated/unavailable for further evaluation. Electronically Signed   By: Kristine Garbe M.D.   On: 01/26/2019 00:16   Ct Maxillofacial Wo Cm  Result Date: 01/25/2019 CLINICAL DATA:  85 year old female with fall EXAM: CT HEAD WITHOUT CONTRAST CT MAXILLOFACIAL WITHOUT CONTRAST CT CERVICAL SPINE WITHOUT CONTRAST TECHNIQUE: Multidetector CT imaging of the head, cervical spine, and maxillofacial structures were performed using the standard protocol without intravenous contrast. Multiplanar CT image reconstructions of the cervical spine and maxillofacial structures were also generated. COMPARISON:  None. FINDINGS: CT HEAD FINDINGS Brain: No acute intracranial hemorrhage. No midline shift or mass effect. Gray-white differentiation maintained. Unremarkable appearance of the ventricular system. Volume loss. Senescent calcifications of the basal ganglia Vascular: Intracranial calcifications Skull: No acute fracture.  No aggressive bone lesion identified. Sinuses/Orbits: Unremarkable appearance of the orbits. Mastoid air cells clear. No middle ear effusion. No significant sinus disease. Other: None CT MAXILLOFACIAL FINDINGS Osseous: No mandibular fracture. TMJ aligned bilaterally. No maxillary fracture. Multiple absent teeth with dental amalgams. Dental caries. No ethmoid fracture. No zygomatic fracture. Unremarkable nasal bones. Rightward nasal spur without fracture. Orbits: Unremarkable orbits. Sinuses: Unremarkable sinuses. Soft tissue defect of the anterior nasal septum. Soft tissues: Focal soft tissue swelling lateral to the left orbit on the zygomatic bone. No radiopaque foreign body. CT CERVICAL SPINE FINDINGS Alignment: Craniocervical junction aligned. Anatomic alignment of the cervical elements. No subluxation. Skull base and vertebrae: No acute  fracture at the skullbase. Vertebral body heights relatively maintained. No acute fracture identified. Soft tissues and spinal canal: Dense calcifications of the carotid arteries. No lymphadenopathy. No bony canal narrowing or canal hematoma. Disc levels: Degenerative disc disease present throughout the cervical spine, worst at the C3-C6 levels. Varying degrees bilateral neural foraminal narrowing secondary to uncovertebral joint disease and facet disease. Upper chest: Scarring/atelectasis at the lung apices. Other: None IMPRESSION: Head CT: No acute intracranial abnormality. Maxillofacial CT: No acute fracture. Soft tissue defect of the anterior nasal septum, typically seen with longstanding use vaso constrictive substances. Cervical CT: No acute fracture or malalignment of the cervical spine. Multilevel degenerative changes. Advanced calcifications of the bilateral carotid arteries. Electronically Signed   By: Corrie Mckusick D.O.   On: 01/25/2019 21:31        Scheduled Meds: . aspirin  325 mg Oral Daily  . enoxaparin (LOVENOX) injection  30 mg Subcutaneous Q24H  . feeding supplement (ENSURE ENLIVE)  237 mL Oral BID BM  . levothyroxine  50 mcg Oral Q0600  . metoprolol tartrate  50 mg Oral Daily  . multivitamin with minerals  1 tablet Oral Daily  . potassium chloride  40 mEq Oral Q4H  . simvastatin  20 mg Oral q1800   Continuous Infusions: . sodium chloride 75 mL/hr at 01/27/19 1320  . cefTRIAXone (ROCEPHIN)  IV 1 g (01/27/19 0028)     LOS: 1 day    Time spent: 35 minutes    Irine Seal, MD [facility]  If 7PM-7AM, please contact night-coverage [email or web address] 01/27/2019, 2:44 PM

## 2019-01-27 NOTE — Progress Notes (Signed)
Initial Nutrition Assessment   RD working remotely.   DOCUMENTATION CODES:   Not applicable  INTERVENTION:   Ensure Enlive po BID, each supplement provides 350 kcal and 20 grams of protein  MVI with Minerals daily  Feeding assistance and encouragement as needed   NUTRITION DIAGNOSIS:   Increased nutrient needs related to wound healing as evidenced by estimated needs.  GOAL:   Patient will meet greater than or equal to 90% of their needs   MONITOR:   PO intake, Supplement acceptance, Labs, Weight trends, Skin  REASON FOR ASSESSMENT:   Malnutrition Screening Tool    ASSESSMENT:   85 yo female admitted with acute encephalopathy likely with chronic dementia, UTI, recurrent falls. PMH includes HTN, PAF on eliquis, anemia, uterine cancer,    Recorded po intake 25-50% of meals  Unable to obtain detailed diet and weight hx from patient due to AMS/confusion.   Current wt 47.9 kg; per weight encounters, it does appear that weight has trended down recently  Wt Readings from Last 10 Encounters:  01/26/19 47.9 kg  10/24/18 50.8 kg  09/19/18 52.2 kg  09/12/18 50.8 kg  08/22/18 51.3 kg  07/14/18 49.6 kg  07/13/18 50.3 kg  06/22/18 50.3 kg  11/29/17 54.9 kg  11/22/17 54 kg    Labs: potassium 2.9 (L) Meds: NS at 75 ml/hr, KCL   NUTRITION - FOCUSED PHYSICAL EXAM:  Unable to assess, working remotely  Diet Order:   Diet Order            Diet regular Room service appropriate? Yes; Fluid consistency: Thin  Diet effective now              EDUCATION NEEDS:   Not appropriate for education at this time  Skin:  Skin Integrity Issues:: DTI, Stage II, Other (Comment) DTI: coccyx Stage II: sacrum Other: ecchymosis: face, eye, shoulder, knee  Last BM:  4/15  Height:   Ht Readings from Last 1 Encounters:  01/26/19 5' (1.524 m)    Weight:   Wt Readings from Last 1 Encounters:  01/26/19 47.9 kg    BMI:  Body mass index is 20.62 kg/m.  Estimated  Nutritional Needs:   Kcal:  1440-1680 kcals   Protein:  72-84 g   Fluid:  >/= 1.5 L   Kerman Passey MS, RD, LDN, CNSC [phone] Pager  [phone] Weekend/On-Call Pager

## 2019-01-27 NOTE — Progress Notes (Signed)
PT Cancellation Note  Patient Details Name: Dominique [name] MRN: [id number] DOB: 13-Jun-1933   Cancelled Treatment:    Reason Eval/Treat Not Completed: Fatigue/lethargy limiting ability to participate; patient just back from x-ray and refusing participation with confusion stating awaiting transport home to her son's home.  Encouraged OOB but continued to decline.  Will attempt another day.   Reginia Naas 01/27/2019, 4:28 PM Magda Kiel, [place] [phone] 01/27/2019

## 2019-01-27 NOTE — Progress Notes (Signed)
Orthopedic Tech Progress Note Patient Details:  Dominique [name] [birth year] [id number]  Ortho Devices Type of Ortho Device: Sling immobilizer Ortho Device/Splint Location: rue Ortho Device/Splint Interventions: Ordered, Application, Adjustment   Post Interventions Patient Tolerated: Well Instructions Provided: Care of device, Adjustment of device   Karolee Stamps 01/27/2019, 6:44 PM

## 2019-01-27 NOTE — Progress Notes (Addendum)
Notified MD Grandville Silos in person of k=2.9 and Mg=1.8.      1400- notified MD via [facility] page that pt had new right sided weakness. MD came to the bedside and assessed pt- The right arm the pt was unable to lift her arm above her head- she used her left hand to move it. Weakness in right leg- unable to lift it off the chair. Pt was able to move left side. MD ordered X-rays of the RIGHT arm, shoulder, forearm and wrist.    Paulla Fore, RN

## 2019-01-27 NOTE — Consult Note (Signed)
Reason for Consult:Right shoulder pain/weakness Referring Physician: A Hongalgi  Brayah Urquilla Dominique [name] is an 85 y.o. female.  HPI: Dominique [name] was found down on 4/15. The patient was confused and unable to contribute to history. This seems to have been related to dehydration and UTI. It's unknown how long she was down. As she has gotten less confused she's been c/o right arm pain and was noted to be somewhat weak. MRI brain was negative. X-rays were ordered and orthopedic surgery was consulted. She is RHD.  Past Medical History:  Diagnosis Date  . Anemia   . Cancer ([place])    uterine  . Hypertension   . Hyponatremia   . Hypothyroidism   . LVH (left ventricular hypertrophy)   . PAF (paroxysmal atrial fibrillation) ([facility])    a. initial dx 2012 in setting of acute illness. b. began to recur 09/2017; started on amiodarone 11/2017.  Marland Kitchen Shingles   . Thrombocytopenia ([place])     Past Surgical History:  Procedure Laterality Date  . ABDOMINAL HYSTERECTOMY    . JOINT REPLACEMENT Bilateral    knees    Family History  Problem Relation Age of Onset  . Tuberculosis Father   . Alcoholism Brother   . Hypertension Son     Social History:  reports that she has never smoked. She has never used smokeless tobacco. She reports that she does not drink alcohol or use drugs.  Allergies:  Allergies  Allergen Reactions  . Codeine   . Darvocet [Propoxyphene N-Acetaminophen]   . Demerol   . Percocet [Oxycodone-Acetaminophen]   . Prednisone     Uncertain this is true. Was documented when patient was admitted 02/12/2012, but not recalled by either the patient nor her son. Tolerating it easily 08/2016.     Medications: I have reviewed the patient's current medications.  Results for orders placed or performed during the hospital encounter of 01/25/19 (from the past 48 hour(s))  Basic metabolic panel     Status: Abnormal   Collection Time: 01/25/19  8:30 PM  Result Value Ref Range   Sodium 132 (L) 135 - 145 mmol/L    Potassium 3.4 (L) 3.5 - 5.1 mmol/L   Chloride 94 (L) 98 - 111 mmol/L   CO2 24 22 - 32 mmol/L   Glucose, Bld 115 (H) 70 - 99 mg/dL   BUN 33 (H) 8 - 23 mg/dL   Creatinine, Ser 1.10 (H) 0.44 - 1.00 mg/dL   Calcium 9.4 8.9 - 10.3 mg/dL   GFR calc non Af Amer 46 (L) >60 mL/min   GFR calc Af Amer 54 (L) >60 mL/min   Anion gap 14 5 - 15    Comment: Performed at [facility], [address] [place], [place] [address]  CBC     Status: Abnormal   Collection Time: 01/25/19  8:30 PM  Result Value Ref Range   WBC 22.3 (H) 4.0 - 10.5 K/uL   RBC 3.54 (L) 3.87 - 5.11 MIL/uL   Hemoglobin 11.3 (L) 12.0 - 15.0 g/dL   HCT 34.0 (L) 36.0 - 46.0 %   MCV 96.0 80.0 - 100.0 fL   MCH 31.9 26.0 - 34.0 pg   MCHC 33.2 30.0 - 36.0 g/dL   RDW 13.6 11.5 - 15.5 %   Platelets 587 (H) 150 - 400 K/uL   nRBC 0.0 0.0 - 0.2 %    Comment: Performed at [facility] [address] [place],  [address]  CK  Status: None   Collection Time: 01/25/19 11:21 PM  Result Value Ref Range   Total CK 179 38 - 234 U/L    Comment: Performed at [facility], [place] [address], [place], [place] [address]  Urine culture     Status: Abnormal   Collection Time: 01/25/19 11:57 PM  Result Value Ref Range   Specimen Description URINE, RANDOM    Special Requests      NONE Performed at [facility], [place] [address], [place], [place] [address]    Culture >=100,000 COLONIES/mL ESCHERICHIA COLI (A)    Report Status 01/27/2019 FINAL    Organism ID, Bacteria ESCHERICHIA COLI (A)       Susceptibility   Escherichia coli - MIC*    AMPICILLIN 16 INTERMEDIATE Intermediate     CEFAZOLIN <=4 SENSITIVE Sensitive     CEFTRIAXONE <=1 SENSITIVE Sensitive     CIPROFLOXACIN <=0.25 SENSITIVE Sensitive     GENTAMICIN <=1 SENSITIVE Sensitive     IMIPENEM <=0.25 SENSITIVE Sensitive     NITROFURANTOIN <=16 SENSITIVE Sensitive     TRIMETH/SULFA <=20 SENSITIVE Sensitive     AMPICILLIN/SULBACTAM 4 SENSITIVE Sensitive      PIP/TAZO <=4 SENSITIVE Sensitive     Extended ESBL NEGATIVE Sensitive     * >=100,000 COLONIES/mL ESCHERICHIA COLI  Basic metabolic panel     Status: Abnormal   Collection Time: 01/26/19  1:45 AM  Result Value Ref Range   Sodium 134 (L) 135 - 145 mmol/L   Potassium 3.5 3.5 - 5.1 mmol/L   Chloride 97 (L) 98 - 111 mmol/L   CO2 23 22 - 32 mmol/L   Glucose, Bld 103 (H) 70 - 99 mg/dL   BUN 34 (H) 8 - 23 mg/dL   Creatinine, Ser 1.14 (H) 0.44 - 1.00 mg/dL   Calcium 9.0 8.9 - 10.3 mg/dL   GFR calc non Af Amer 44 (L) >60 mL/min   GFR calc Af Amer 51 (L) >60 mL/min   Anion gap 14 5 - 15    Comment: Performed at [facility], [place] [address] [place], [place] [address]  CBC     Status: Abnormal   Collection Time: 01/26/19  1:45 AM  Result Value Ref Range   WBC 20.0 (H) 4.0 - 10.5 K/uL   RBC 3.27 (L) 3.87 - 5.11 MIL/uL   Hemoglobin 10.4 (L) 12.0 - 15.0 g/dL   HCT 31.3 (L) 36.0 - 46.0 %   MCV 95.7 80.0 - 100.0 fL   MCH 31.8 26.0 - 34.0 pg   MCHC 33.2 30.0 - 36.0 g/dL   RDW 13.7 11.5 - 15.5 %   Platelets 532 (H) 150 - 400 K/uL   nRBC 0.0 0.0 - 0.2 %    Comment: Performed at [facility] [address], [place], [place] [address]  TSH     Status: None   Collection Time: 01/26/19  2:33 AM  Result Value Ref Range   TSH 1.897 0.350 - 4.500 uIU/mL    Comment: Performed by a 3rd Generation assay with a functional sensitivity of <=0.01 uIU/mL. Performed at [facility], [place] [address], [place], [place] [address]   Culture, blood (Routine X 2) w Reflex to ID Panel     Status: None (Preliminary result)   Collection Time: 01/26/19  9:32 AM  Result Value Ref Range   Specimen Description BLOOD LEFT ANTECUBITAL    Special Requests      BOTTLES DRAWN  AEROBIC ONLY Blood Culture adequate volume   Culture      NO GROWTH < 24 HOURS Performed at [facility] [address] [place], [place] [address]    Report Status PENDING   Vitamin B12     Status: Abnormal   Collection  Time: 01/26/19  9:35 AM  Result Value Ref Range   Vitamin B-12 1,418 (H) 180 - 914 pg/mL    Comment: (NOTE) This assay is not validated for testing neonatal or myeloproliferative syndrome specimens for Vitamin B12 levels. Performed at [facility], [place] [address] [place], [place] [address]   Folate RBC     Status: Abnormal   Collection Time: 01/26/19  9:35 AM  Result Value Ref Range   Folate, Hemolysate 387.0 Not Estab. ng/mL   Hematocrit 31.2 (L) 34.0 - 46.6 %   Folate, RBC 1,240 >498 ng/mL    Comment: (NOTE) Performed At: [facility] [place], [place] [id number] Rush Farmer MD [id number]   RPR     Status: None   Collection Time: 01/26/19  9:35 AM  Result Value Ref Range   RPR Ser Ql Non Reactive Non Reactive    Comment: (NOTE) Performed At: [facility] [address] [place], [place] [id number] Rush Farmer MD MW:[id number]   Culture, blood (Routine X 2) w Reflex to ID Panel     Status: None (Preliminary result)   Collection Time: 01/26/19  9:40 AM  Result Value Ref Range   Specimen Description BLOOD RIGHT HAND    Special Requests      BOTTLES DRAWN AEROBIC ONLY Blood Culture results may not be optimal due to an inadequate volume of blood received in culture bottles   Culture      NO GROWTH < 24 HOURS Performed at [facility] [address] [place], [place] [address]    Report Status PENDING   CBC with Differential/Platelet     Status: Abnormal   Collection Time: 01/27/19  7:52 AM  Result Value Ref Range   WBC 10.4 4.0 - 10.5 K/uL   RBC 2.90 (L) 3.87 - 5.11 MIL/uL   Hemoglobin 9.4 (L) 12.0 - 15.0 g/dL   HCT 28.6 (L) 36.0 - 46.0 %   MCV 98.6 80.0 - 100.0 fL   MCH 32.4 26.0 - 34.0 pg   MCHC 32.9 30.0 - 36.0 g/dL   RDW 14.1 11.5 - 15.5 %   Platelets 457 (H) 150 - 400 K/uL   nRBC 0.0 0.0 - 0.2 %   Neutrophils Relative % 64 %   Neutro Abs 6.6 1.7 - 7.7 K/uL   Lymphocytes Relative 27 %   Lymphs Abs 2.8 0.7 -  4.0 K/uL   Monocytes Relative 8 %   Monocytes Absolute 0.8 0.1 - 1.0 K/uL   Eosinophils Relative 1 %   Eosinophils Absolute 0.1 0.0 - 0.5 K/uL   Basophils Relative 0 %   Basophils Absolute 0.0 0.0 - 0.1 K/uL   Immature Granulocytes 0 %   Abs Immature Granulocytes 0.04 0.00 - 0.07 K/uL    Comment: Performed at [facility] [address] [place], [place] [address]  Basic metabolic panel     Status: Abnormal   Collection Time: 01/27/19  7:52 AM  Result Value Ref Range   Sodium 137 135 - 145 mmol/L   Potassium 2.9 (L) 3.5 - 5.1 mmol/L   Chloride 108 98 - 111 mmol/L   CO2 22 22 -  32 mmol/L   Glucose, Bld 101 (H) 70 - 99 mg/dL   BUN 20 8 - 23 mg/dL   Creatinine, Ser 0.60 0.44 - 1.00 mg/dL   Calcium 8.0 (L) 8.9 - 10.3 mg/dL   GFR calc non Af Amer >60 >60 mL/min   GFR calc Af Amer >60 >60 mL/min   Anion gap 7 5 - 15    Comment: Performed at [facility] [address], [place], [place] [address]  Magnesium     Status: None   Collection Time: 01/27/19  7:52 AM  Result Value Ref Range   Magnesium 1.8 1.7 - 2.4 mg/dL    Comment: Performed at [facility] [address], [place], [place] [address]    Dg Chest 2 View  Result Date: 01/26/2019 CLINICAL DATA:  85 y/o  F; fall prior to admission. Chest pain. EXAM: CHEST - 2 VIEW COMPARISON:  09/06/2018 CT chest. FINDINGS: Normal cardiac silhouette given projection and technique. Aortic atherosclerosis with calcification. Peripheral basilar pulmonary fibrosis. No consolidation. No pleural effusion or pneumothorax. Bones are demineralized. No acute osseous abnormality is evident. Degenerative changes of the right shoulder joint with superior migration of humerus. IMPRESSION: No acute process identified. Electronically Signed   By: Kristine Garbe M.D.   On: 01/26/2019 00:12   Ct Head Wo Contrast  Result Date: 01/25/2019 CLINICAL DATA:  85 year old female with fall EXAM: CT HEAD WITHOUT CONTRAST CT MAXILLOFACIAL  WITHOUT CONTRAST CT CERVICAL SPINE WITHOUT CONTRAST TECHNIQUE: Multidetector CT imaging of the head, cervical spine, and maxillofacial structures were performed using the standard protocol without intravenous contrast. Multiplanar CT image reconstructions of the cervical spine and maxillofacial structures were also generated. COMPARISON:  None. FINDINGS: CT HEAD FINDINGS Brain: No acute intracranial hemorrhage. No midline shift or mass effect. Gray-white differentiation maintained. Unremarkable appearance of the ventricular system. Volume loss. Senescent calcifications of the basal ganglia Vascular: Intracranial calcifications Skull: No acute fracture.  No aggressive bone lesion identified. Sinuses/Orbits: Unremarkable appearance of the orbits. Mastoid air cells clear. No middle ear effusion. No significant sinus disease. Other: None CT MAXILLOFACIAL FINDINGS Osseous: No mandibular fracture. TMJ aligned bilaterally. No maxillary fracture. Multiple absent teeth with dental amalgams. Dental caries. No ethmoid fracture. No zygomatic fracture. Unremarkable nasal bones. Rightward nasal spur without fracture. Orbits: Unremarkable orbits. Sinuses: Unremarkable sinuses. Soft tissue defect of the anterior nasal septum. Soft tissues: Focal soft tissue swelling lateral to the left orbit on the zygomatic bone. No radiopaque foreign body. CT CERVICAL SPINE FINDINGS Alignment: Craniocervical junction aligned. Anatomic alignment of the cervical elements. No subluxation. Skull base and vertebrae: No acute fracture at the skullbase. Vertebral body heights relatively maintained. No acute fracture identified. Soft tissues and spinal canal: Dense calcifications of the carotid arteries. No lymphadenopathy. No bony canal narrowing or canal hematoma. Disc levels: Degenerative disc disease present throughout the cervical spine, worst at the C3-C6 levels. Varying degrees bilateral neural foraminal narrowing secondary to uncovertebral joint  disease and facet disease. Upper chest: Scarring/atelectasis at the lung apices. Other: None IMPRESSION: Head CT: No acute intracranial abnormality. Maxillofacial CT: No acute fracture. Soft tissue defect of the anterior nasal septum, typically seen with longstanding use vaso constrictive substances. Cervical CT: No acute fracture or malalignment of the cervical spine. Multilevel degenerative changes. Advanced calcifications of the bilateral carotid arteries. Electronically Signed   By: Corrie Mckusick D.O.   On: 01/25/2019 21:31   Ct Cervical Spine Wo Contrast  Result Date: 01/25/2019 CLINICAL DATA:  85 year old  female with fall EXAM: CT HEAD WITHOUT CONTRAST CT MAXILLOFACIAL WITHOUT CONTRAST CT CERVICAL SPINE WITHOUT CONTRAST TECHNIQUE: Multidetector CT imaging of the head, cervical spine, and maxillofacial structures were performed using the standard protocol without intravenous contrast. Multiplanar CT image reconstructions of the cervical spine and maxillofacial structures were also generated. COMPARISON:  None. FINDINGS: CT HEAD FINDINGS Brain: No acute intracranial hemorrhage. No midline shift or mass effect. Gray-white differentiation maintained. Unremarkable appearance of the ventricular system. Volume loss. Senescent calcifications of the basal ganglia Vascular: Intracranial calcifications Skull: No acute fracture.  No aggressive bone lesion identified. Sinuses/Orbits: Unremarkable appearance of the orbits. Mastoid air cells clear. No middle ear effusion. No significant sinus disease. Other: None CT MAXILLOFACIAL FINDINGS Osseous: No mandibular fracture. TMJ aligned bilaterally. No maxillary fracture. Multiple absent teeth with dental amalgams. Dental caries. No ethmoid fracture. No zygomatic fracture. Unremarkable nasal bones. Rightward nasal spur without fracture. Orbits: Unremarkable orbits. Sinuses: Unremarkable sinuses. Soft tissue defect of the anterior nasal septum. Soft tissues: Focal soft tissue  swelling lateral to the left orbit on the zygomatic bone. No radiopaque foreign body. CT CERVICAL SPINE FINDINGS Alignment: Craniocervical junction aligned. Anatomic alignment of the cervical elements. No subluxation. Skull base and vertebrae: No acute fracture at the skullbase. Vertebral body heights relatively maintained. No acute fracture identified. Soft tissues and spinal canal: Dense calcifications of the carotid arteries. No lymphadenopathy. No bony canal narrowing or canal hematoma. Disc levels: Degenerative disc disease present throughout the cervical spine, worst at the C3-C6 levels. Varying degrees bilateral neural foraminal narrowing secondary to uncovertebral joint disease and facet disease. Upper chest: Scarring/atelectasis at the lung apices. Other: None IMPRESSION: Head CT: No acute intracranial abnormality. Maxillofacial CT: No acute fracture. Soft tissue defect of the anterior nasal septum, typically seen with longstanding use vaso constrictive substances. Cervical CT: No acute fracture or malalignment of the cervical spine. Multilevel degenerative changes. Advanced calcifications of the bilateral carotid arteries. Electronically Signed   By: Corrie Mckusick D.O.   On: 01/25/2019 21:31   Mr Brain Wo Contrast  Result Date: 01/26/2019 CLINICAL DATA:  Encephalopathy. Altered mental status. Patient found on floor yesterday afternoon. EXAM: MRI HEAD WITHOUT CONTRAST TECHNIQUE: Multiplanar, multiecho pulse sequences of the brain and surrounding structures were obtained without intravenous contrast. COMPARISON:  CT head without contrast 01/25/2019 FINDINGS: Brain: Mild atrophy and white matter changes are within normal limits for age. No acute infarct, hemorrhage, or mass lesion is present. The ventricles are of proportionate to the degree of atrophy. No significant extraaxial fluid collection is present. The internal auditory canals are within normal limits. The brainstem and cerebellum are within  normal limits. Vascular: Flow is present in the major intracranial arteries. Skull and upper cervical spine: Degenerative changes are noted at C1-2. Additional degenerative changes are present in the upper cervical spine. Craniocervical junction is otherwise normal. Midline structures are within normal limits. Sinuses/Orbits: The paranasal sinuses and mastoid air cells are clear. A 10 mm hematoma is present lateral to the left orbit. Bilateral lens replacements are noted. Globes and orbits are otherwise unremarkable. Other: IMPRESSION: 1. Normal MRI appearance of the brain for age. No acute or focal lesion to explain encephalopathy. 2. Small left periorbital hematoma without underlying fracture or globe injury. 3. Degenerative changes of the cervical spine. Electronically Signed   By: San Morelle M.D.   On: 01/26/2019 04:19   Dg Chest Port 1 View  Result Date: 01/27/2019 CLINICAL DATA:  Shortness of breath. EXAM: PORTABLE CHEST 1 VIEW COMPARISON:  Radiographs of January 25, 2019. FINDINGS: The heart size and mediastinal contours are within normal limits. No pneumothorax or pleural effusion is noted. Stable bilateral lung opacities are noted most consistent with scarring or fibrosis. No acute abnormality is noted. Atherosclerosis of thoracic aorta is noted. The visualized skeletal structures are unremarkable. IMPRESSION: No acute cardiopulmonary abnormality seen. Aortic Atherosclerosis (ICD10-I70.0). Electronically Signed   By: Marijo Conception M.D.   On: 01/27/2019 08:20   Dg Knee Complete 4 Views Left  Result Date: 01/26/2019 CLINICAL DATA:  85 y/o F; fall at home. Hematoma to left hip and left knee. EXAM: LEFT KNEE - COMPLETE 4+ VIEW COMPARISON:  02/10/2018 left lower extremity radiograph FINDINGS: Bones are demineralized. Total knee arthroplasty. No periprosthetic lucency or fracture identified. No malalignment. Vascular calcifications noted. No joint effusion. IMPRESSION: Total knee arthroplasty.  No acute fracture or dislocation identified. Electronically Signed   By: Kristine Garbe M.D.   On: 01/26/2019 00:17   Dg Hips Bilat W Or Wo Pelvis 5 Views  Result Date: 01/26/2019 CLINICAL DATA:  85 y/o F; fall at home. Hematoma to the left hip and left knee. EXAM: DG HIP (WITH OR WITHOUT PELVIS) 5+V BILAT COMPARISON:  02/10/2018 hip and pelvis radiographs. FINDINGS: There is no evidence of hip fracture or dislocation. Bones are demineralized. There is no evidence of arthropathy or other focal bone abnormality. IMPRESSION: No acute fracture or dislocation identified. If suspicion for fracture persists, consider hip MRI or hip CT if MRI is contraindicated/unavailable for further evaluation. Electronically Signed   By: Kristine Garbe M.D.   On: 01/26/2019 00:16   Ct Maxillofacial Wo Cm  Result Date: 01/25/2019 CLINICAL DATA:  85 year old female with fall EXAM: CT HEAD WITHOUT CONTRAST CT MAXILLOFACIAL WITHOUT CONTRAST CT CERVICAL SPINE WITHOUT CONTRAST TECHNIQUE: Multidetector CT imaging of the head, cervical spine, and maxillofacial structures were performed using the standard protocol without intravenous contrast. Multiplanar CT image reconstructions of the cervical spine and maxillofacial structures were also generated. COMPARISON:  None. FINDINGS: CT HEAD FINDINGS Brain: No acute intracranial hemorrhage. No midline shift or mass effect. Gray-white differentiation maintained. Unremarkable appearance of the ventricular system. Volume loss. Senescent calcifications of the basal ganglia Vascular: Intracranial calcifications Skull: No acute fracture.  No aggressive bone lesion identified. Sinuses/Orbits: Unremarkable appearance of the orbits. Mastoid air cells clear. No middle ear effusion. No significant sinus disease. Other: None CT MAXILLOFACIAL FINDINGS Osseous: No mandibular fracture. TMJ aligned bilaterally. No maxillary fracture. Multiple absent teeth with dental amalgams. Dental  caries. No ethmoid fracture. No zygomatic fracture. Unremarkable nasal bones. Rightward nasal spur without fracture. Orbits: Unremarkable orbits. Sinuses: Unremarkable sinuses. Soft tissue defect of the anterior nasal septum. Soft tissues: Focal soft tissue swelling lateral to the left orbit on the zygomatic bone. No radiopaque foreign body. CT CERVICAL SPINE FINDINGS Alignment: Craniocervical junction aligned. Anatomic alignment of the cervical elements. No subluxation. Skull base and vertebrae: No acute fracture at the skullbase. Vertebral body heights relatively maintained. No acute fracture identified. Soft tissues and spinal canal: Dense calcifications of the carotid arteries. No lymphadenopathy. No bony canal narrowing or canal hematoma. Disc levels: Degenerative disc disease present throughout the cervical spine, worst at the C3-C6 levels. Varying degrees bilateral neural foraminal narrowing secondary to uncovertebral joint disease and facet disease. Upper chest: Scarring/atelectasis at the lung apices. Other: None IMPRESSION: Head CT: No acute intracranial abnormality. Maxillofacial CT: No acute fracture. Soft tissue defect of the anterior nasal septum, typically seen with longstanding use vaso constrictive substances. Cervical  CT: No acute fracture or malalignment of the cervical spine. Multilevel degenerative changes. Advanced calcifications of the bilateral carotid arteries. Electronically Signed   By: Corrie Mckusick D.O.   On: 01/25/2019 21:31    Review of Systems  Constitutional: Negative for weight loss.  HENT: Negative for ear discharge, ear pain, hearing loss and tinnitus.   Eyes: Negative for blurred vision, double vision, photophobia and pain.  Respiratory: Negative for cough, sputum production and shortness of breath.   Cardiovascular: Negative for chest pain.  Gastrointestinal: Negative for abdominal pain, nausea and vomiting.  Genitourinary: Negative for dysuria, flank pain, frequency  and urgency.  Musculoskeletal: Positive for joint pain (Right shoulder). Negative for back pain, falls, myalgias and neck pain.  Neurological: Negative for dizziness, tingling, sensory change, focal weakness, loss of consciousness and headaches.  Endo/Heme/Allergies: Does not bruise/bleed easily.  Psychiatric/Behavioral: Negative for depression, memory loss and substance abuse. The patient is not nervous/anxious.    Blood pressure (!) 127/58, pulse 80, temperature 98.1 F (36.7 C), temperature source Oral, resp. rate 18, height 5' (1.524 m), weight 47.9 kg, SpO2 98 %. Physical Exam  Constitutional: She appears well-developed and well-nourished. No distress.  HENT:  Head: Normocephalic and atraumatic.  Eyes: Conjunctivae are normal. Right eye exhibits no discharge. Left eye exhibits no discharge. No scleral icterus.  Neck: Normal range of motion.  Cardiovascular: Normal rate and regular rhythm.  Respiratory: Effort normal. No respiratory distress.  Musculoskeletal:     Comments: Right shoulder, elbow, wrist, digits- no skin wounds, some eccymoses anterior shoulder, crepitus noted in shoulder and elbow, diffuse TTP shoulder and proximal forearm, no pain with PROM, mild-mod pain with all AROM, esp shoulder flexion and pronation against resistance, no instability, no blocks to motion  Sens  Ax/R/M/U intact  Mot   Ax/ R/ PIN/ M/ AIN/ U intact  Rad 2+  Neurological: She is alert.  Skin: Skin is warm and dry. She is not diaphoretic.  Psychiatric: She has a normal mood and affect. Her behavior is normal.    Assessment/Plan: Right upper extremity pain/weakness -- I think she has general muscluar strain vs contusion in the upper extremity. I will order a sling for comfort but she can be WBAT. This should improve with time and PT/OT. UTI/dehydration Multiple medical problems including HTN, PAF on eliquis, and hypothyroidism -- per primary service    Dominique Abu, PA-C Orthopedic  Surgery [phone] 01/27/2019, 3:15 PM

## 2019-01-27 NOTE — Progress Notes (Signed)
PT Cancellation Note  Patient Details Name: Dominique [name] MRN: [id number] DOB: [birth year]   Cancelled Treatment:    Reason Eval/Treat Not Completed: Fatigue/lethargy limiting ability to participate; attempted to see pt this am.  Patient not willing to participate with several issues (not feeling well, has been padded up in bed with external catheter, etc).  Encouraged to participate, but currently not willing.  Will attempt later as time permits.    Reginia Naas 01/27/2019, 10:09 AM  Magda Kiel, [place] [phone] 01/27/2019

## 2019-01-27 NOTE — TOC Progression Note (Signed)
Transition of Care [facility]) - Progression Note    Patient Details  Name: CARLEIGH BUCCIERI MRN: [id number] Date of Birth: December 20, 1932  Transition of Care [facility]) CM/SW Contact  Sharlet Salina Mila Homer, LCSW Phone Number: 01/27/2019, 5:00 PM  Clinical Narrative:  CSW received call from Mr. Ratterree (4:06 pm) regarding discharge decision. The family has decided on ST rehab for patient. CSW advised Mr. Buchan that the patient's information would be sent out to facilities in [facility] and she will be advised of responses, so that he and family can make a decision. Son had gone on Medicare.gov website and his questions were answered. Mr. Bobak expressed appreciation to CSW for assistance.   Expected Discharge Plan: Skilled Nursing Facility Barriers to Discharge: Continued Medical Work up  Expected Discharge Plan and Services Expected Discharge Plan: [place]                                   Social Determinants of Health (SDOH) Interventions    Readmission Risk Interventions No flowsheet data found.

## 2019-01-27 NOTE — [place] FL2 (Signed)
[place] MEDICAID FL2 LEVEL OF CARE SCREENING TOOL     IDENTIFICATION  Patient Name: Dominique [name] Birthdate: Oct 13, 1932 Sex: female Admission Date (Current Location): 01/25/2019  [facility] and [place] Number:  Herbalist and Address:  [facility] [place], [place] [address] [place], [place] [address]      Provider Number: [id number]  Attending Physician Name and Address:  Eugenie Filler, MD  Relative Name and Phone Number:  Telsa Dillavou, son; [phone]    Current Level of Care: Hospital Recommended Level of Care: [place] Prior Approval Number:    Date Approved/Denied:   PASRR Number: [id number]  Discharge Plan: SNF    Current Diagnoses: Patient Active Problem List   Diagnosis Date Noted  . E. coli UTI 01/27/2019  . Hypokalemia 01/27/2019  . Weakness of right upper extremity 01/27/2019  . Weakness of right lower extremity 01/27/2019  . Sacral wound   . Acute encephalopathy 01/26/2019  . Falls 01/26/2019  . Pressure injury of skin 01/26/2019  . Abnormal CT of the chest 07/14/2018  . 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 PAF (paroxysmal atrial fibrillation) ([place]) 11/26/2017  . Atrial fibrillation with RVR ([place])   . Essential hypertension   . History of uterine cancer 09/02/2016  . Benign essential HTN 09/02/2016  . Hypothyroidism 09/02/2016  . History of shingles 09/02/2016  . History of iron deficiency anemia 09/02/2016  . History of thrombocytopenia 09/02/2016    Orientation RESPIRATION BLADDER Height & Weight     Self, Time  Normal External catheter, Incontinent(External catheter placed 4/16) Weight: 105 lb 9.6 oz (47.9 kg) Height:  5' (152.4 cm)  BEHAVIORAL SYMPTOMS/MOOD NEUROLOGICAL BOWEL NUTRITION STATUS      Continent Diet(Regular)  AMBULATORY STATUS COMMUNICATION OF NEEDS Skin   (Ambulation was deferred during PT eval on 4/16 due to fatigue with transfers) Verbally Other (Comment)(Stage 2 pressure  injury to mid-sacrum with foam dressing; Deep tissue injury to mid-coccyx with foam dressing; Ecchymosis to right shoulder, left eye, left knee and hand; Hematoma on left knee)                       Personal Care Assistance Level of Assistance  Bathing, Feeding, Dressing Bathing Assistance: Maximum assistance Feeding assistance: Limited assistance(Assistance with set-up) Dressing Assistance: Maximum assistance     Functional Limitations Info  Sight, Hearing, Speech Sight Info: Adequate Hearing Info: Adequate Speech Info: Adequate    SPECIAL CARE FACTORS FREQUENCY  PT (By licensed PT), OT (By licensed OT)     PT Frequency: PT eval at hospital 4/16. PT at SNF, Eval and Treat, a minimum of 5 times per week OT Frequency: OT eval at hospital 4/16; OT at SNF Eval and Treat, a minimum of 5 times per week            Contractures Contractures Info: Not present    Additional Factors Info  Code Status, Allergies Code Status Info: Full Allergies Info: Codeine, Darvocet, Demerol, Percocet, Prednisone            Current Medications (01/27/2019):  This is the current hospital active medication list Current Facility-Administered Medications  Medication Dose Route Frequency Provider Last Rate Last Dose  . 0.9 %  sodium chloride infusion   Intravenous Continuous Eugenie Filler, MD 75 mL/hr at 01/27/19 1632    . acetaminophen (TYLENOL) tablet 650 mg  650 mg Oral Q6H PRN Etta Quill, DO  Or  . acetaminophen (TYLENOL) suppository 650 mg  650 mg Rectal Q6H PRN Etta Quill, DO      . aspirin tablet 325 mg  325 mg Oral Daily Eugenie Filler, MD   325 mg at 01/27/19 0931  . cefTRIAXone (ROCEPHIN) 1 g in sodium chloride 0.9 % 100 mL IVPB  1 g Intravenous Q24H Jennette Kettle M, DO 200 mL/hr at 01/27/19 0028 1 g at 01/27/19 0028  . enoxaparin (LOVENOX) injection 30 mg  30 mg Subcutaneous Q24H Eugenie Filler, MD   30 mg at 01/26/19 2208  . feeding supplement (ENSURE  ENLIVE) (ENSURE ENLIVE) liquid 237 mL  237 mL Oral BID BM Eugenie Filler, MD   237 mL at 01/27/19 1555  . levothyroxine (SYNTHROID, LEVOTHROID) tablet 50 mcg  50 mcg Oral Q0600 Etta Quill, DO   50 mcg at 01/26/19 0515  . metoprolol tartrate (LOPRESSOR) tablet 50 mg  50 mg Oral Daily Jennette Kettle M, DO   50 mg at 01/27/19 0931  . multivitamin with minerals tablet 1 tablet  1 tablet Oral Daily Eugenie Filler, MD   1 tablet at 01/27/19 1556  . ondansetron (ZOFRAN) tablet 4 mg  4 mg Oral Q6H PRN Etta Quill, DO       Or  . ondansetron [facility]) injection 4 mg  4 mg Intravenous Q6H PRN Etta Quill, DO      . simvastatin (ZOCOR) tablet 20 mg  20 mg Oral q1800 Etta Quill, DO   20 mg at 01/26/19 1751     Discharge Medications: Please see discharge summary for a list of discharge medications.  Relevant Imaging Results:  Relevant Lab Results:   Additional Information ss#[id number].   Sable Feil, LCSW

## 2019-01-28 LAB — CBC WITH DIFFERENTIAL/PLATELET
Abs Immature Granulocytes: 0.06 10*3/uL (ref 0.00–0.07)
Basophils Absolute: 0.1 10*3/uL (ref 0.0–0.1)
Basophils Relative: 1 %
Eosinophils Absolute: 0.3 10*3/uL (ref 0.0–0.5)
Eosinophils Relative: 2 %
HCT: 31.8 % — ABNORMAL LOW (ref 36.0–46.0)
Hemoglobin: 10.6 g/dL — ABNORMAL LOW (ref 12.0–15.0)
Immature Granulocytes: 0 %
Lymphocytes Relative: 23 %
Lymphs Abs: 3.3 10*3/uL (ref 0.7–4.0)
MCH: 32.6 pg (ref 26.0–34.0)
MCHC: 33.3 g/dL (ref 30.0–36.0)
MCV: 97.8 fL (ref 80.0–100.0)
Monocytes Absolute: 1 10*3/uL (ref 0.1–1.0)
Monocytes Relative: 7 %
Neutro Abs: 10.1 10*3/uL — ABNORMAL HIGH (ref 1.7–7.7)
Neutrophils Relative %: 67 %
Platelets: 431 10*3/uL — ABNORMAL HIGH (ref 150–400)
RBC: 3.25 MIL/uL — ABNORMAL LOW (ref 3.87–5.11)
RDW: 14.1 % (ref 11.5–15.5)
WBC: 14.8 10*3/uL — ABNORMAL HIGH (ref 4.0–10.5)
nRBC: 0 % (ref 0.0–0.2)

## 2019-01-28 LAB — BASIC METABOLIC PANEL
Anion gap: 9 (ref 5–15)
BUN: 12 mg/dL (ref 8–23)
CO2: 26 mmol/L (ref 22–32)
Calcium: 8.4 mg/dL — ABNORMAL LOW (ref 8.9–10.3)
Chloride: 101 mmol/L (ref 98–111)
Creatinine, Ser: 0.51 mg/dL (ref 0.44–1.00)
GFR calc Af Amer: >60 mL/min (ref >60)
GFR calc non Af Amer: >60 mL/min (ref >60)
Glucose, Bld: 98 mg/dL (ref 70–99)
Potassium: 3.5 mmol/L (ref 3.5–5.1)
Sodium: 136 mmol/L (ref 135–145)

## 2019-01-28 LAB — MAGNESIUM: Magnesium: 1.9 mg/dL (ref 1.7–2.4)

## 2019-01-28 MED ORDER — POTASSIUM CHLORIDE CRYS ER 20 MEQ PO TBCR
40.0000 meq | EXTENDED_RELEASE_TABLET | Freq: Once | ORAL | Status: AC
  Administered 2019-01-28: 40 meq via ORAL
  Filled 2019-01-28: qty 2

## 2019-01-28 MED ORDER — CEFDINIR 300 MG PO CAPS
300.0000 mg | ORAL_CAPSULE | Freq: Two times a day (BID) | ORAL | Status: DC
  Administered 2019-01-28 – 2019-01-30 (×5): 300 mg via ORAL
  Filled 2019-01-28 (×6): qty 1

## 2019-01-28 MED ORDER — DONEPEZIL HCL 5 MG PO TABS
5.0000 mg | ORAL_TABLET | Freq: Every day | ORAL | Status: DC
  Administered 2019-01-28 – 2019-01-30 (×3): 5 mg via ORAL
  Filled 2019-01-28 (×3): qty 1

## 2019-01-28 NOTE — Progress Notes (Addendum)
PROGRESS NOTE    Dominique [name]  PPI:[id number] DOB: [birth year] DOA: 01/25/2019 PCP: [name], MD    Brief Narrative:  HPI per Dr. Elinor Parkinson Wisler is a 85 y.o. female with medical history significant of HTN, PAF on eliquis, hypothyroidism.  Patient brought in to ED after EMS called by patients son.  Son reports that he attempted to call his mother at 43 earlier today. He reports that he called numerous times and after not receiving an answer he went over to her house at approximately 1800 and found her laying on the floor next to her bed.  Unsure of what time patient actually initially fell.  Patient is quite confused in triage, also unclear how many falls over past few days she has had.  Turns out she has had episodic confusion since Christmas per son.  Today for example patient reports that she lives at home with her husband. However, when I asked the patient's son he reports that the patient's husband passed away several years ago.  Son reports reports that the patient became very confused just after Christmas and called him crying and stating that she was unable to find her husband. He reports that he took her to his PCP and she was started on donezepil as there was suspected to be some underlying dementia, but she has not further been worked up for these complaints. He reports that she has had worsening confusion after previous falls. He reports that the number of falls that she has had has significantly increased over the last year.   ED Course: Tm 99.1, HR 110s, no fracture findings on numerous imaging studies.  CT head neg.  WBC 22k, UA suspicious for possible UTI.  Assessment & Plan:   Principal Problem:   Acute encephalopathy Active Problems:   E. coli UTI   Hypothyroidism   Essential hypertension   PAF (paroxysmal atrial fibrillation) (HCC)   Falls   Pressure injury of skin   Hypokalemia   Weakness of right upper extremity   Weakness of right  lower extremity   Sacral wound  1 acute metabolic encephalopathy  Secondary to E. coli UTI in the setting of underlying chronic dementia and probable dehydration.  Urinalysis done on admission concerning for UTI.  Urine cultures with greater than 100,000 colonies of E. coli.  MRI brain negative for any acute abnormalities.  TSH within normal limits.  Chest x-ray negative for any acute infiltrate.  Patient improving clinically.  De-escalate antibiotics and discontinue IV Rocephin and placed on Omnicef to complete course of antibiotic treatment.  Saline lock IV fluids.  Supportive care.  Follow.  2.  E. coli UTI De-escalate antibiotics discontinue IV Rocephin and place on oral Omnicef to complete a full course of antibiotic treatment.   3.  Hypothyroidism TSH within normal limits.  Continue home dose Synthroid.  4.  Right upper extremity weakness/right lower extremity weakness Likely secondary to fall.  Secondary to general muscular strain versus contusion in the right upper extremity.  Patient unable to raise right upper extremity above her head/shoulder with complaints of pain in the right shoulder.  Patient noted to have a bruise on the right shoulder.  Patient with some right lower extremity weakness compared to the left lower extremity.  Concern for possible fracture versus rotator cuff injury.  Plain films of right shoulder right upper extremity negative for any acute fracture. Patient had plain films of the bilateral hips and pelvis done on admission which was unremarkable.  Consulted orthopedics who feel patient has a general muscular strain versus contusion in the right upper extremity.  Sling has been ordered for comfort however she can be weightbearing as tolerated per orthopedics.  PT/OT.  5.  Paroxysmal atrial fibrillation Currently rate controlled on Lopressor.  Per admitting MD and per patient patient has been having increasing falls since Christmas of 2019.  Patient presented after she  was found on the floor and noted to have some periorbital ecchymosis/hematoma.  Patient with underlying history of chronic dementia.  Patient likely not a suitable candidate for ongoing anticoagulation in light of increasing number of falls.  Eliquis has been discontinued, at this time and placed on full dose aspirin.  Will need outpatient follow-up with her cardiologist post discharge.  6.  Recurrent Falls Questionable etiology.  May be secondary to UTI and dehydration.  PT/OT.  7.  Dehydration Improved with hydration.  Saline lock IV fluids.    8.  Hypokalemia Replete.  K. Dur 40 mEq p.o. x1.  Magnesium at 1.9.  9.  Hyperlipidemia Statin.   10.  Sacral decubitus, POA Wound care consulted.  Continue current wound care as recommended.  11.  Hypertension Saline lock IV fluids.  Continue home regimen Lopressor.  12.  Probable dementia Resume home regimen Aricept.  Outpatient follow-up.   DVT prophylaxis: Lovenox Code Status: Full Family Communication: Updated patient.  Updated son on telephone. Disposition Plan: Skilled nursing facility versus home with home health.   Consultants:   Orthopedics: Hilbert Odor, PA 01/27/2019  Procedures:   MRI head 01/26/2019  CT head CT C-spine, CT maxillofacial 01/25/2019  Plain films of bilateral hips and pelvis 01/25/2019  Chest x-ray 01/25/2019, 01/27/2019  Plain films of the right knee 01/25/2019  Plain films of the right upper extremity and right shoulder 01/27/2019  Antimicrobials:   IV Rocephin 01/26/2019>>>>>01/28/2019  Omnicef 01/28/2019   Subjective: Patient laying in bed.  Denies any chest pain.  No shortness of breath.  States she is feeling better.  Patient complained of right foot pain.  Dysuria improving.  Per RN patient with 3 loose stools.  Objective: Vitals:   01/27/19 2055 01/28/19 0456 01/28/19 0500 01/28/19 0847  BP: (!) 189/86 (!) 171/89  (!) 156/78  Pulse: 73 70  75  Resp: 20 20  18   Temp: 98.4 F (36.9 C)  98.2 F (36.8 C)  98.2 F (36.8 C)  TempSrc: Oral Oral  Oral  SpO2: 97% 99%  100%  Weight:   53.1 kg   Height:        Intake/Output Summary (Last 24 hours) at 01/28/2019 1026 Last data filed at 01/28/2019 1000 Gross per 24 hour  Intake 1081 ml  Output 1603 ml  Net -522 ml   Filed Weights   01/26/19 1400 01/26/19 2019 01/28/19 0500  Weight: 47.2 kg 47.9 kg 53.1 kg    Examination:  General exam: Appears calm and comfortable  Respiratory system: Fine crackles noted in the bases bilaterally.  No wheezing, no rhonchi.  Speaking in full sentences.  Normal respiratory effort.  Cardiovascular system: Regular rate rhythm no murmurs rubs or gallops.  No JVD.  No lower extremity edema. Gastrointestinal system: Abdomen is soft, nontender, nondistended, positive bowel sounds.  No rebound.  No guarding. Central nervous system: Alert and oriented. No focal neurological deficits. Extremities: Right upper extremity weakness with inability to raise right upper extremity above the shoulder with pain in the right shoulder.  Right lower extremity weakness.  Skin: Ecchymosis noted on  the left orbital region.  Ecchymosis/bruise noted on right shoulder.  Deep tissue injury on sacrum. Psychiatry: Judgement and insight appear fair. Mood & affect appropriate.     Data Reviewed: I have personally reviewed following labs and imaging studies  CBC: Recent Labs  Lab 01/25/19 2030 01/26/19 0145 01/26/19 0935 01/27/19 0752 01/28/19 0432  WBC 22.3* 20.0*  --  10.4 14.8*  NEUTROABS  --   --   --  6.6 10.1*  HGB 11.3* 10.4*  --  9.4* 10.6*  HCT 34.0* 31.3* 31.2* 28.6* 31.8*  MCV 96.0 95.7  --  98.6 97.8  PLT 587* 532*  --  457* 703*   Basic Metabolic Panel: Recent Labs  Lab 01/25/19 2030 01/26/19 0145 01/27/19 0752 01/28/19 0432  NA 132* 134* 137 136  K 3.4* 3.5 2.9* 3.5  CL 94* 97* 108 101  CO2 24 23 22 26   GLUCOSE 115* 103* 101* 98  BUN 33* 34* 20 12  CREATININE 1.10* 1.14* 0.60 0.51   CALCIUM 9.4 9.0 8.0* 8.4*  MG  --   --  1.8 1.9   GFR: Estimated Creatinine Clearance: 38.3 mL/min (by C-G formula based on SCr of 0.51 mg/dL). Liver Function Tests: No results for input(s): AST, ALT, ALKPHOS, BILITOT, PROT, ALBUMIN in the last 168 hours. No results for input(s): LIPASE, AMYLASE in the last 168 hours. No results for input(s): AMMONIA in the last 168 hours. Coagulation Profile: No results for input(s): INR, PROTIME in the last 168 hours. Cardiac Enzymes: Recent Labs  Lab 01/25/19 2321  CKTOTAL 179   BNP (last 3 results) No results for input(s): PROBNP in the last 8760 hours. HbA1C: No results for input(s): HGBA1C in the last 72 hours. CBG: No results for input(s): GLUCAP in the last 168 hours. Lipid Profile: No results for input(s): CHOL, HDL, LDLCALC, TRIG, CHOLHDL, LDLDIRECT in the last 72 hours. Thyroid Function Tests: Recent Labs    01/26/19 0233  TSH 1.897   Anemia Panel: Recent Labs    01/26/19 0935  VITAMINB12 1,418*   Sepsis Labs: No results for input(s): PROCALCITON, LATICACIDVEN in the last 168 hours.  Recent Results (from the past 240 hour(s))  Urine culture     Status: Abnormal   Collection Time: 01/25/19 11:57 PM  Result Value Ref Range Status   Specimen Description URINE, RANDOM  Final   Special Requests   Final    NONE Performed at [facility], [address], [place], [place] [address]    Culture >=100,000 COLONIES/mL ESCHERICHIA COLI (A)  Final   Report Status 01/27/2019 FINAL  Final   Organism ID, Bacteria ESCHERICHIA COLI (A)  Final      Susceptibility   Escherichia coli - MIC*    AMPICILLIN 16 INTERMEDIATE Intermediate     CEFAZOLIN <=4 SENSITIVE Sensitive     CEFTRIAXONE <=1 SENSITIVE Sensitive     CIPROFLOXACIN <=0.25 SENSITIVE Sensitive     GENTAMICIN <=1 SENSITIVE Sensitive     IMIPENEM <=0.25 SENSITIVE Sensitive     NITROFURANTOIN <=16 SENSITIVE Sensitive     TRIMETH/SULFA <=20 SENSITIVE Sensitive      AMPICILLIN/SULBACTAM 4 SENSITIVE Sensitive     PIP/TAZO <=4 SENSITIVE Sensitive     Extended ESBL NEGATIVE Sensitive     * >=100,000 COLONIES/mL ESCHERICHIA COLI  Culture, blood (Routine X 2) w Reflex to ID Panel     Status: None (Preliminary result)   Collection Time: 01/26/19  9:32 AM  Result Value Ref Range Status  Specimen Description BLOOD LEFT ANTECUBITAL  Final   Special Requests   Final    BOTTLES DRAWN AEROBIC ONLY Blood Culture adequate volume   Culture   Final    NO GROWTH 2 DAYS Performed at [facility], [address] [place], [place] [address]    Report Status PENDING  Incomplete  Culture, blood (Routine X 2) w Reflex to ID Panel     Status: None (Preliminary result)   Collection Time: 01/26/19  9:40 AM  Result Value Ref Range Status   Specimen Description BLOOD RIGHT HAND  Final   Special Requests   Final    BOTTLES DRAWN AEROBIC ONLY Blood Culture results may not be optimal due to an inadequate volume of blood received in culture bottles   Culture   Final    NO GROWTH 2 DAYS Performed at  [facility], [place] [address] [place],  [address]    Report Status PENDING  Incomplete         Radiology Studies: Dg Shoulder Right  Result Date: 01/27/2019 CLINICAL DATA:  Fall EXAM: RIGHT SHOULDER - 2+ VIEW COMPARISON:  None. FINDINGS: No acute fracture. No dislocation. The subacromial space is obliterated. There is been erosion of the superior humeral head is well as the inferior aspect of the acromion. Chronic changes throughout the visualized right lung are noted. Small calcific densities project over the right axilla and are of unknown significance. IMPRESSION: No acute bony pathology.  Chronic changes are noted. Electronically Signed   By: Marybelle Killings M.D.   On: 01/27/2019 15:40   Dg Forearm Right  Result Date: 01/27/2019 CLINICAL DATA:  85 year old female with right arm pain EXAM: RIGHT FOREARM - 2 VIEW COMPARISON:  None. FINDINGS: Osteopenia. No  acute displaced fracture. Degenerative changes at the wrist. Atherosclerotic calcifications. No radiopaque foreign body.  Degenerative changes at the elbow. IMPRESSION: Negative for acute bony abnormality. Atherosclerosis Electronically Signed   By: Corrie Mckusick D.O.   On: 01/27/2019 15:33   Dg Wrist Complete Right  Result Date: 01/27/2019 CLINICAL DATA:  85 year old female with a fall and pain EXAM: RIGHT WRIST - COMPLETE 3+ VIEW COMPARISON:  None. FINDINGS: Diffuse osteopenia. Irregularity at the mid pole of the scaphoid, including cortical irregularity on two views. Irregularity on the cortex of the distal radius at the radiocarpal joint changes of the carpal bones. Degenerative changes at the first carpometacarpal joint. Soft tissue swelling at the wrist.  Chondral calcifications. Vascular calcifications. IMPRESSION: Questionable nondisplaced fracture at the scaphoid waist. Questionable nondisplaced fracture at the distal radius at the radiocarpal joint. Osteopenia. Soft tissue swelling. Degenerative changes of the carpal bones and the first carpometacarpal joint as well as chondral calcifications. Electronically Signed   By: Corrie Mckusick D.O.   On: 01/27/2019 15:49   Dg Chest Port 1 View  Result Date: 01/27/2019 CLINICAL DATA:  Shortness of breath. EXAM: PORTABLE CHEST 1 VIEW COMPARISON:  Radiographs of January 25, 2019. FINDINGS: The heart size and mediastinal contours are within normal limits. No pneumothorax or pleural effusion is noted. Stable bilateral lung opacities are noted most consistent with scarring or fibrosis. No acute abnormality is noted. Atherosclerosis of thoracic aorta is noted. The visualized skeletal structures are unremarkable. IMPRESSION: No acute cardiopulmonary abnormality seen. Aortic Atherosclerosis (ICD10-I70.0). Electronically Signed   By: Marijo Conception M.D.   On: 01/27/2019 08:20   Dg Humerus Right  Result Date: 01/27/2019 CLINICAL DATA:  Right arm pain after fall.  EXAM: RIGHT HUMERUS -  2+ VIEW COMPARISON:  None. FINDINGS: No fracture or dislocation is noted. Severe narrowing of right subacromial space is noted suggesting chronic rotator cuff injury and degenerative change. Irregularity is seen involving right humeral head most consistent with degenerative change, as there is significant superior migration of the right humeral head. Degenerative changes are also seen involving the right glenohumeral joint. IMPRESSION: No acute abnormality seen in the right humerus. Extensive degenerative changes are seen involving the right shoulder as described above. Electronically Signed   By: Marijo Conception M.D.   On: 01/27/2019 15:31        Scheduled Meds: . aspirin  325 mg Oral Daily  . cefdinir  300 mg Oral Q12H  . donepezil  5 mg Oral Daily  . enoxaparin (LOVENOX) injection  30 mg Subcutaneous Q24H  . feeding supplement (ENSURE ENLIVE)  237 mL Oral BID BM  . levothyroxine  50 mcg Oral Q0600  . metoprolol tartrate  50 mg Oral Daily  . multivitamin with minerals  1 tablet Oral Daily  . simvastatin  20 mg Oral q1800   Continuous Infusions:    LOS: 2 days    Time spent: 35 minutes    Irine Seal, MD [facility]  If 7PM-7AM, please contact night-coverage [email or web address] 01/28/2019, 10:26 AM

## 2019-01-28 NOTE — Progress Notes (Signed)
Occupational Therapy Treatment Patient Details Name: Dominique [name] MRN: [id number] DOB: 1932/10/19 Today's Date: 01/28/2019    History of present illness 85 y.o. female with medical history significant of HTN, PAF on eliquis, hypothyroidism. Pt admitted with a fall, UTI, sacral wound, confusion.    OT comments  Pt. Initially declined skilled OT, but was agreeable after encouragement.  Able to complete bed mobility, stand pivot transfer to recliner min a.  Cues for sequencing and safety.    Follow Up Recommendations  Home health OT;SNF;Supervision/Assistance - 24 hour    Equipment Recommendations  None recommended by OT    Recommendations for Other Services      Precautions / Restrictions Precautions Precautions: Fall Precaution Comments: fall just PTA, per chart multiple falls       Mobility Bed Mobility Overal bed mobility: Needs Assistance Bed Mobility: Rolling;Sidelying to Sit Rolling: Min guard Sidelying to sit: Min assist          Transfers Overall transfer level: Needs assistance Equipment used: 1 person hand held assist Transfers: Sit to/from [facility] Sit to Stand: Min assist Stand pivot transfers: Min assist            Balance                                           ADL either performed or assessed with clinical judgement   ADL Overall ADL's : Needs assistance/impaired                         Toilet Transfer: Minimal assistance;Stand-pivot Armed forces technical officer Details (indicate cue type and reason): simulated eob to recliner (pt. had used b.room prior to my arrival)   Toileting - Water quality scientist Details (indicate cue type and reason): simulated min a     Functional mobility during ADLs: Cueing for sequencing;Cueing for safety;Minimal assistance       Vision       Perception     Praxis      Cognition Arousal/Alertness: Awake/alert Behavior During Therapy: WFL for tasks  assessed/performed Overall Cognitive Status: No family/caregiver present to determine baseline cognitive functioning                                 General Comments: per chart pt has memory deficits, pt oriented to self        Exercises     Shoulder Instructions       General Comments      Pertinent Vitals/ Pain       Pain Assessment: No/denies pain  Home Living                                          Prior Functioning/Environment              Frequency  Min 2X/week        Progress Toward Goals  OT Goals(current goals can now be found in the care plan section)  Progress towards OT goals: Progressing toward goals     Plan      Co-evaluation                 AM-PAC OT "6 Clicks" Daily Activity  Outcome Measure   Help from another person eating meals?: A Little Help from another person taking care of personal grooming?: A Little Help from another person toileting, which includes using toliet, bedpan, or urinal?: A Little Help from another person bathing (including washing, rinsing, drying)?: A Little Help from another person to put on and taking off regular upper body clothing?: A Little Help from another person to put on and taking off regular lower body clothing?: A Little 6 Click Score: 18    End of Session    OT Visit Diagnosis: Unsteadiness on feet (R26.81);Muscle weakness (generalized) (M62.81);Repeated falls (R29.6);History of falling (Z91.81)   Activity Tolerance Patient tolerated treatment well   Patient Left in chair;with call bell/phone within reach;with chair alarm set   Nurse Communication          Time: [id number] OT Time Calculation (min): 14 min  Charges: OT General Charges $OT Visit: 1 Visit OT Treatments $Self Care/Home Management : 8-22 mins   Janice Coffin, COTA/L 01/28/2019, 9:34 AM

## 2019-01-29 DIAGNOSIS — S31000D Unspecified open wound of lower back and pelvis without penetration into retroperitoneum, subsequent encounter: Secondary | ICD-10-CM

## 2019-01-29 LAB — BASIC METABOLIC PANEL
Anion gap: 9 (ref 5–15)
BUN: 10 mg/dL (ref 8–23)
CO2: 24 mmol/L (ref 22–32)
Calcium: 8.7 mg/dL — ABNORMAL LOW (ref 8.9–10.3)
Chloride: 99 mmol/L (ref 98–111)
Creatinine, Ser: 0.57 mg/dL (ref 0.44–1.00)
GFR calc Af Amer: >60 mL/min (ref >60)
GFR calc non Af Amer: >60 mL/min (ref >60)
Glucose, Bld: 86 mg/dL (ref 70–99)
Potassium: 4.6 mmol/L (ref 3.5–5.1)
Sodium: 132 mmol/L — ABNORMAL LOW (ref 135–145)

## 2019-01-29 LAB — CBC
HCT: 31.8 % — ABNORMAL LOW (ref 36.0–46.0)
Hemoglobin: 10.7 g/dL — ABNORMAL LOW (ref 12.0–15.0)
MCH: 33.2 pg (ref 26.0–34.0)
MCHC: 33.6 g/dL (ref 30.0–36.0)
MCV: 98.8 fL (ref 80.0–100.0)
Platelets: 467 10*3/uL — ABNORMAL HIGH (ref 150–400)
RBC: 3.22 MIL/uL — ABNORMAL LOW (ref 3.87–5.11)
RDW: 14.1 % (ref 11.5–15.5)
WBC: 14 10*3/uL — ABNORMAL HIGH (ref 4.0–10.5)
nRBC: 0 % (ref 0.0–0.2)

## 2019-01-29 NOTE — Progress Notes (Signed)
PROGRESS NOTE    Dominique [name]  ERD:[id number] DOB: [birth year] DOA: 01/25/2019 PCP: Dominique Solian, MD    Brief Narrative:  HPI per Dr. [name] is a 85 y.o. female with medical history significant of HTN, PAF on eliquis, hypothyroidism.  Patient brought in to ED after EMS called by patients son.  Son reports that he attempted to call his mother at 42 earlier today. He reports that he called numerous times and after not receiving an answer he went over to her house at approximately 1800 and found her laying on the floor next to her bed.  Unsure of what time patient actually initially fell.  Patient is quite confused in triage, also unclear how many falls over past few days she has had.  Turns out she has had episodic confusion since Christmas per son.  Today for example patient reports that she lives at home with her husband. However, when I asked the patient's son he reports that the patient's husband passed away several years ago.  Son reports reports that the patient became very confused just after Christmas and called him crying and stating that she was unable to find her husband. He reports that he took her to his PCP and she was started on donezepil as there was suspected to be some underlying dementia, but she has not further been worked up for these complaints. He reports that she has had worsening confusion after previous falls. He reports that the number of falls that she has had has significantly increased over the last year.   ED Course: Tm 99.1, HR 110s, no fracture findings on numerous imaging studies.  CT head neg.  WBC 22k, UA suspicious for possible UTI.  Assessment & Plan:   Principal Problem:   Acute encephalopathy Active Problems:   E. coli UTI   Hypothyroidism   Essential hypertension   PAF (paroxysmal atrial fibrillation) (HCC)   Falls   Pressure injury of skin   Hypokalemia   Weakness of right upper extremity   Weakness of right  lower extremity   Sacral wound  1 acute metabolic encephalopathy  Secondary to E. coli UTI in the setting of underlying chronic dementia and probable dehydration.  Urinalysis done on admission concerning for UTI.  Urine cultures with greater than 100,000 colonies of E. coli.  MRI brain negative for any acute abnormalities.  TSH within normal limits.  Chest x-ray negative for any acute infiltrate.  Patient improving clinically.  IV antibiotics have been downgraded from Rocephin to [facility] to complete a 5 to 7-day course of antibiotic treatment.  Continue supportive care.  Follow.   2.  E. coli UTI Patient was on IV Rocephin has been transitioned to oral Omnicef to complete a 5 to 7-day course of antibiotic treatment.  Outpatient follow-up.    3.  Hypothyroidism TSH of 1.897.  Continue Synthroid.   4.  Right upper extremity weakness/right lower extremity weakness Likely secondary to fall.  Secondary to general muscular strain versus contusion in the right upper extremity.  Patient unable to raise right upper extremity above her head/shoulder with complaints of pain in the right shoulder.  Patient noted to have a bruise on the right shoulder.  Patient with some right lower extremity weakness compared to the left lower extremity.  Concern for possible fracture versus rotator cuff injury.  Plain films of right shoulder right upper extremity negative for any acute fracture. Patient had plain films of the bilateral hips and pelvis done on  admission which was unremarkable.  Consulted orthopedics who feel patient has a general muscular strain versus contusion in the right upper extremity.  Sling has been ordered for comfort however she can be weightbearing as tolerated per orthopedics.  PT/OT.  5.  Paroxysmal atrial fibrillation Currently rate controlled on Lopressor.  Per admitting MD and per patient patient has been having increasing falls since Christmas of 2019.  Patient presented after she was found on  the floor and noted to have some periorbital ecchymosis/hematoma.  Patient with underlying history of chronic dementia.  Patient likely not a suitable candidate for ongoing anticoagulation in light of increasing number of falls.  Eliquis has been discontinued, at this time and placed on full dose aspirin.  Will need outpatient follow-up with her cardiologist post discharge.  6.  Recurrent Falls Questionable etiology.  May be secondary to UTI and dehydration.  PT/OT.  7.  Dehydration Improved with hydration.  Saline lock IV fluids.    8.  Hypokalemia Repleted.  Follow.   9.  Hyperlipidemia Continue statin.   10.  Sacral decubitus, POA Wound care consulted.  Continue current wound care as recommended.  11.  Hypertension Blood pressure stable.  Continue home regimen Lopressor.   12.  Probable dementia Continue home regimen Aricept.  Outpatient follow-up.    DVT prophylaxis: Lovenox Code Status: Full Family Communication: Updated patient.  Disposition Plan: Skilled nursing facility hopefully tomorrow if bed available.    Consultants:   Orthopedics: Hilbert Odor, PA 01/27/2019  Procedures:   MRI head 01/26/2019  CT head CT C-spine, CT maxillofacial 01/25/2019  Plain films of bilateral hips and pelvis 01/25/2019  Chest x-ray 01/25/2019, 01/27/2019  Plain films of the right knee 01/25/2019  Plain films of the right upper extremity and right shoulder 01/27/2019  Antimicrobials:   IV Rocephin 01/26/2019>>>>>01/28/2019  Omnicef 01/28/2019   Subjective: Patient sitting up in chair.  Denies any chest pain or shortness of breath.  Dysuria improving.  Stools more formed per RN today.   Objective: Vitals:   01/28/19 1605 01/28/19 2007 01/29/19 0613 01/29/19 0830  BP: 127/65 (!) 153/86 (!) 140/108 102/79  Pulse: 62 64 67 81  Resp: 20 18 18 18   Temp: 98.6 F (37 C) 98.7 F (37.1 C) 98.5 F (36.9 C) 98.5 F (36.9 C)  TempSrc: Oral Oral Oral Oral  SpO2: 98% 99% 99% 99%   Weight:      Height:        Intake/Output Summary (Last 24 hours) at 01/29/2019 1048 Last data filed at 01/29/2019 1000 Gross per 24 hour  Intake 300 ml  Output -  Net 300 ml   Filed Weights   01/26/19 1400 01/26/19 2019 01/28/19 0500  Weight: 47.2 kg 47.9 kg 53.1 kg    Examination:  General exam: Appears calm and comfortable  Respiratory system: Fine crackles in the bases otherwise clear.  No wheezing, no rhonchi.  Speaking in full sentences.  Normal respiratory effort.  Cardiovascular system: RRR no murmurs rubs or gallops.  No JVD.  No lower extremity edema. Gastrointestinal system: Abdomen is soft, nontender, nondistended, positive bowel sounds.  No rebound.  No guarding. Central nervous system: Alert and oriented. No focal neurological deficits. Extremities: Right upper extremity weakness with inability to raise right upper extremity above the shoulder with pain in the right shoulder.  Right lower extremity weakness.  Skin: Ecchymosis noted on the left orbital region.  Ecchymosis/bruise noted on right shoulder.  Deep tissue injury on sacrum. Psychiatry: Judgement and  insight appear fair. Mood & affect appropriate.     Data Reviewed: I have personally reviewed following labs and imaging studies  CBC: Recent Labs  Lab 01/25/19 2030 01/26/19 0145 01/26/19 0935 01/27/19 0752 01/28/19 0432 01/29/19 0737  WBC 22.3* 20.0*  --  10.4 14.8* 14.0*  NEUTROABS  --   --   --  6.6 10.1*  --   HGB 11.3* 10.4*  --  9.4* 10.6* 10.7*  HCT 34.0* 31.3* 31.2* 28.6* 31.8* 31.8*  MCV 96.0 95.7  --  98.6 97.8 98.8  PLT 587* 532*  --  457* 431* 706*   Basic Metabolic Panel: Recent Labs  Lab 01/25/19 2030 01/26/19 0145 01/27/19 0752 01/28/19 0432 01/29/19 0737  NA 132* 134* 137 136 132*  K 3.4* 3.5 2.9* 3.5 4.6  CL 94* 97* 108 101 99  CO2 24 23 22 26 24   GLUCOSE 115* 103* 101* 98 86  BUN 33* 34* 20 12 10   CREATININE 1.10* 1.14* 0.60 0.51 0.57  CALCIUM 9.4 9.0 8.0* 8.4* 8.7*   MG  --   --  1.8 1.9  --    GFR: Estimated Creatinine Clearance: 38.3 mL/min (by C-G formula based on SCr of 0.57 mg/dL). Liver Function Tests: No results for input(s): AST, ALT, ALKPHOS, BILITOT, PROT, ALBUMIN in the last 168 hours. No results for input(s): LIPASE, AMYLASE in the last 168 hours. No results for input(s): AMMONIA in the last 168 hours. Coagulation Profile: No results for input(s): INR, PROTIME in the last 168 hours. Cardiac Enzymes: Recent Labs  Lab 01/25/19 2321  CKTOTAL 179   BNP (last 3 results) No results for input(s): PROBNP in the last 8760 hours. HbA1C: No results for input(s): HGBA1C in the last 72 hours. CBG: No results for input(s): GLUCAP in the last 168 hours. Lipid Profile: No results for input(s): CHOL, HDL, LDLCALC, TRIG, CHOLHDL, LDLDIRECT in the last 72 hours. Thyroid Function Tests: No results for input(s): TSH, T4TOTAL, FREET4, T3FREE, THYROIDAB in the last 72 hours. Anemia Panel: No results for input(s): VITAMINB12, FOLATE, FERRITIN, TIBC, IRON, RETICCTPCT in the last 72 hours. Sepsis Labs: No results for input(s): PROCALCITON, LATICACIDVEN in the last 168 hours.  Recent Results (from the past 240 hour(s))  Urine culture     Status: Abnormal   Collection Time: 01/25/19 11:57 PM  Result Value Ref Range Status   Specimen Description URINE, RANDOM  Final   Special Requests   Final    NONE Performed at [facility], [address] [place], [place] [address]    Culture >=100,000 COLONIES/mL ESCHERICHIA COLI (A)  Final   Report Status 01/27/2019 FINAL  Final   Organism ID, Bacteria ESCHERICHIA COLI (A)  Final      Susceptibility   Escherichia coli - MIC*    AMPICILLIN 16 INTERMEDIATE Intermediate     CEFAZOLIN <=4 SENSITIVE Sensitive     CEFTRIAXONE <=1 SENSITIVE Sensitive     CIPROFLOXACIN <=0.25 SENSITIVE Sensitive     GENTAMICIN <=1 SENSITIVE Sensitive     IMIPENEM <=0.25 SENSITIVE Sensitive     NITROFURANTOIN <=16 SENSITIVE  Sensitive     TRIMETH/SULFA <=20 SENSITIVE Sensitive     AMPICILLIN/SULBACTAM 4 SENSITIVE Sensitive     PIP/TAZO <=4 SENSITIVE Sensitive     Extended ESBL NEGATIVE Sensitive     * >=100,000 COLONIES/mL ESCHERICHIA COLI  Culture, blood (Routine X 2) w Reflex to ID Panel     Status: None (Preliminary result)   Collection Time: 01/26/19  9:32 AM  Result Value Ref Range Status   Specimen Description BLOOD LEFT ANTECUBITAL  Final   Special Requests   Final    BOTTLES DRAWN AEROBIC ONLY Blood Culture adequate volume   Culture   Final    NO GROWTH 3 DAYS Performed at [facility], [address], [place], [place] [address]    Report Status PENDING  Incomplete  Culture, blood (Routine X 2) w Reflex to ID Panel     Status: None (Preliminary result)   Collection Time: 01/26/19  9:40 AM  Result Value Ref Range Status   Specimen Description BLOOD RIGHT HAND  Final   Special Requests   Final    BOTTLES DRAWN AEROBIC ONLY Blood Culture results may not be optimal due to an inadequate volume of blood received in culture bottles   Culture   Final    NO GROWTH 3 DAYS Performed at [facility], [place] [address], [place], [place] [address]    Report Status PENDING  Incomplete         Radiology Studies: Dg Shoulder Right  Result Date: 01/27/2019 CLINICAL DATA:  Fall EXAM: RIGHT SHOULDER - 2+ VIEW COMPARISON:  None. FINDINGS: No acute fracture. No dislocation. The subacromial space is obliterated. There is been erosion of the superior humeral head is well as the inferior aspect of the acromion. Chronic changes throughout the visualized right lung are noted. Small calcific densities project over the right axilla and are of unknown significance. IMPRESSION: No acute bony pathology.  Chronic changes are noted. Electronically Signed   By: Marybelle Killings M.D.   On: 01/27/2019 15:40   Dg Forearm Right  Result Date: 01/27/2019 CLINICAL DATA:  85 year old female with right arm pain EXAM: RIGHT  FOREARM - 2 VIEW COMPARISON:  None. FINDINGS: Osteopenia. No acute displaced fracture. Degenerative changes at the wrist. Atherosclerotic calcifications. No radiopaque foreign body.  Degenerative changes at the elbow. IMPRESSION: Negative for acute bony abnormality. Atherosclerosis Electronically Signed   By: Corrie Mckusick D.O.   On: 01/27/2019 15:33   Dg Wrist Complete Right  Result Date: 01/27/2019 CLINICAL DATA:  85 year old female with a fall and pain EXAM: RIGHT WRIST - COMPLETE 3+ VIEW COMPARISON:  None. FINDINGS: Diffuse osteopenia. Irregularity at the mid pole of the scaphoid, including cortical irregularity on two views. Irregularity on the cortex of the distal radius at the radiocarpal joint changes of the carpal bones. Degenerative changes at the first carpometacarpal joint. Soft tissue swelling at the wrist.  Chondral calcifications. Vascular calcifications. IMPRESSION: Questionable nondisplaced fracture at the scaphoid waist. Questionable nondisplaced fracture at the distal radius at the radiocarpal joint. Osteopenia. Soft tissue swelling. Degenerative changes of the carpal bones and the first carpometacarpal joint as well as chondral calcifications. Electronically Signed   By: Corrie Mckusick D.O.   On: 01/27/2019 15:49   Dg Humerus Right  Result Date: 01/27/2019 CLINICAL DATA:  Right arm pain after fall. EXAM: RIGHT HUMERUS - 2+ VIEW COMPARISON:  None. FINDINGS: No fracture or dislocation is noted. Severe narrowing of right subacromial space is noted suggesting chronic rotator cuff injury and degenerative change. Irregularity is seen involving right humeral head most consistent with degenerative change, as there is significant superior migration of the right humeral head. Degenerative changes are also seen involving the right glenohumeral joint. IMPRESSION: No acute abnormality seen in the right humerus. Extensive degenerative changes are seen involving the right shoulder as described above.  Electronically Signed   By: Jeneen Rinks  Murlean Caller M.D.   On: 01/27/2019 15:31        Scheduled Meds: . aspirin  325 mg Oral Daily  . cefdinir  300 mg Oral Q12H  . donepezil  5 mg Oral Daily  . enoxaparin (LOVENOX) injection  30 mg Subcutaneous Q24H  . feeding supplement (ENSURE ENLIVE)  237 mL Oral BID BM  . levothyroxine  50 mcg Oral Q0600  . metoprolol tartrate  50 mg Oral Daily  . multivitamin with minerals  1 tablet Oral Daily  . simvastatin  20 mg Oral q1800   Continuous Infusions:    LOS: 3 days    Time spent: 35 minutes    Irine Seal, MD [facility]  If 7PM-7AM, please contact night-coverage [email or web address] 01/29/2019, 10:48 AM

## 2019-01-29 NOTE — Progress Notes (Signed)
CSW called and gave bed offers to the patient's son. CSW will continue to assist with disposition.   Domenic Schwab, MSW, [place]

## 2019-01-30 LAB — BASIC METABOLIC PANEL
Anion gap: 10 (ref 5–15)
BUN: 13 mg/dL (ref 8–23)
CO2: 24 mmol/L (ref 22–32)
Calcium: 8.6 mg/dL — ABNORMAL LOW (ref 8.9–10.3)
Chloride: 101 mmol/L (ref 98–111)
Creatinine, Ser: 0.78 mg/dL (ref 0.44–1.00)
GFR calc Af Amer: >60 mL/min (ref >60)
GFR calc non Af Amer: >60 mL/min (ref >60)
Glucose, Bld: 87 mg/dL (ref 70–99)
Potassium: 3.9 mmol/L (ref 3.5–5.1)
Sodium: 135 mmol/L (ref 135–145)

## 2019-01-30 MED ORDER — ASPIRIN 325 MG PO TABS: 325.0000 mg | ORAL_TABLET | Freq: Every day | ORAL | Status: DC

## 2019-01-30 MED ORDER — ADULT MULTIVITAMIN W/MINERALS CH: 1.0000 | ORAL_TABLET | Freq: Every day | ORAL | Status: DC

## 2019-01-30 MED ORDER — CEFDINIR 300 MG PO CAPS: 300.0000 mg | ORAL_CAPSULE | Freq: Two times a day (BID) | ORAL | 0 refills | Status: AC

## 2019-01-30 MED ORDER — ENSURE ENLIVE PO LIQD: 237.0000 mL | Freq: Two times a day (BID) | ORAL | 0 refills | Status: DC

## 2019-01-30 MED ORDER — ENOXAPARIN SODIUM 30 MG/0.3ML ~~LOC~~ SOLN: 30.0000 mg | SUBCUTANEOUS | Status: DC

## 2019-01-30 NOTE — Discharge Summary (Signed)
Physician Discharge Summary  Dominique [name] HQI:[id number] DOB: 1933-09-13 DOA: 01/25/2019  PCP: Dominique Solian, MD  Admit date: 01/25/2019 Discharge date: 01/30/2019  Time spent: 50 minutes  Recommendations for Outpatient Follow-up:  1. Follow-up with MD at skilled nursing facility. 2. Follow-up at [facility] in 3 weeks.  On follow-up patient's anticoagulation will need to be reassessed as patient's anticoagulation was discontinued during this hospitalization and patient placed on full dose aspirin due to increasing falls and probable underlying chronic dementia.   Discharge Diagnoses:  Principal Problem:   Acute encephalopathy Active Problems:   E. coli UTI   Hypothyroidism   Essential hypertension   PAF (paroxysmal atrial fibrillation) (HCC)   Falls   Pressure injury of skin   Hypokalemia   Weakness of right upper extremity   Weakness of right lower extremity   Sacral wound   Discharge Condition: Stable and improved  Diet recommendation: Mechanical soft diet  Filed Weights   01/26/19 1400 01/26/19 2019 01/28/19 0500  Weight: 47.2 kg 47.9 kg 53.1 kg    History of present illness:  Per Dr. Elinor Parkinson [name] is a 85 y.o. female with medical history significant of HTN, PAF on eliquis, hypothyroidism.  Patient brought in to ED after EMS called by patients son.  Son reported that he attempted to call his mother at 66 earlier on the day of admission. He reported that he called numerous times and after not receiving an answer he went over to her house at approximately 1800 and found her laying on the floor next to her bed.  Unsure of what time patient actually initially fell.  Patient was quite confused in triage, also unclear how many falls over past few days she has had.  Turned out she has had episodic confusion since Christmas per son.    On day of admission, for example patient reported that she lives at home with her husband. However, when I asked the  patient's son he reported that the patient's husband passed away several years ago.  Son reports reports that the patient became very confused just after Christmas and called him crying and stating that she was unable to find her husband. He reports that he took her to his PCP and she was started on donezepil as there was suspected to be some underlying dementia, but she has not further been worked up for these complaints. He reports that she has had worsening confusion after previous falls. He reports that the number of falls that she has had has significantly increased over the last year.   ED Course: Tm 99.1, HR 110s, no fracture findings on numerous imaging studies.  CT head neg.  WBC 22k, UA suspicious for possible UTI.  Hospital Course:  1 acute metabolic encephalopathy  Patient had presented with acute metabolic encephalopathy with confusion.  Secondary to E. coli UTI in the setting of underlying chronic dementia and probable dehydration.  Urinalysis done on admission concerning for UTI.  Urine cultures with greater than 100,000 colonies of E. coli.  MRI brain negative for any acute abnormalities.  TSH within normal limits.  Chest x-ray negative for any acute infiltrate.  Patient was placed empirically on IV Rocephin as well as IV fluids and improved clinically during the hospitalization.  Once cultures and sensitivities were finalized patient was transitioned to oral Omnicef which he tolerated.  Patient be discharged on 1 more day of oral Omnicef to complete a 5-day course of antibiotic treatment.  Patient was close  to baseline by day of discharge.  Outpatient follow-up.  2.  E. coli UTI Patient was placed on IV Rocephin on admission while urine cultures were pending.  Urine cultures came back positive for E. coli greater than 100,000 colonies.  Patient was subsequently transitioned to oral Omnicef and will be discharged on 1 more day of oral Omnicef to complete a 5-day course of  antibiotic treatment.  Outpatient follow-up.  3.  Hypothyroidism TSH of 1.897.    Patient maintained on home regimen Synthroid.    4.  Right upper extremity weakness/right lower extremity weakness Likely secondary to fall.  Secondary to general muscular strain versus contusion in the right upper extremity.  Patient unable to raise right upper extremity above her head/shoulder with complaints of pain in the right shoulder.  Patient noted to have a bruise on the right shoulder.  Patient with some right lower extremity weakness compared to the left lower extremity.  Concern for possible fracture versus rotator cuff injury.  Plain films of right shoulder right upper extremity negative for any acute fracture. Patient had plain films of the bilateral hips and pelvis done on admission which was unremarkable.  Consulted orthopedics who felt patient has a general muscular strain versus contusion in the right upper extremity.  Sling has been ordered for comfort however she can be weightbearing as tolerated per orthopedics.  PT/OT.  5.  Paroxysmal atrial fibrillation Patient was maintained on Lopressor for rate control.  Per admitting MD and per patient, patient has been having increasing falls since Christmas of 2019.  Patient presented after she was found on the floor and noted to have some periorbital ecchymosis/hematoma.  Patient with underlying history of chronic dementia.  Patient likely not a suitable candidate for ongoing anticoagulation in light of increasing number of falls.  Eliquis has been discontinued, at this time and placed on full dose aspirin.  Will need outpatient follow-up with her cardiologist post discharge.  6.  Recurrent Falls Questionable etiology.  May be secondary to UTI and dehydration.  PT/OT.  PT OT recommended skilled nursing facility which patient will be discharged with.  7.  Dehydration Patient was hydrated with IV fluids and was euvolemic by day of discharge.   8.   Hypokalemia Repleted.  Follow.   9.  Hyperlipidemia Patient maintained on statin.   10.  Sacral decubitus, POA Wound care consulted.  Continue current wound care as recommended.  11.  Hypertension Blood pressure stable.  Patient maintained on home regimen of Lopressor.  12.  Probable dementia Patient was resumed back on home regimen of Aricept.  Outpatient follow-up.        Procedures:  MRI head 01/26/2019  CT head CT C-spine, CT maxillofacial 01/25/2019  Plain films of bilateral hips and pelvis 01/25/2019  Chest x-ray 01/25/2019, 01/27/2019  Plain films of the right knee 01/25/2019  Plain films of the right upper extremity and right shoulder 01/27/2019   Consultations:  None  Discharge Exam: Vitals:   01/30/19 0522 01/30/19 0842  BP: (!) 164/87 (!) 146/73  Pulse: 72 68  Resp: 20 18  Temp: 97.6 F (36.4 C) 98.1 F (36.7 C)  SpO2: 99% 97%    General: NAD Cardiovascular: RRR Respiratory: CTAB  Discharge Instructions   Discharge Instructions    Diet general   Complete by:  As directed    Mechanical soft diet.   Increase activity slowly   Complete by:  As directed      Allergies as of 01/30/2019  Reactions   Codeine Other (See Comments)   Unknown per son   Darvocet [propoxyphene N-acetaminophen] Other (See Comments)   Unknown per son   Demerol Other (See Comments)   Unknown per son   Percocet [oxycodone-acetaminophen] Other (See Comments)   Unknown per son   Prednisone    Uncertain this is true. Was documented when patient was admitted 02/12/2012, but not recalled by either the patient nor her son. Tolerating it easily 08/2016.       Medication List    STOP taking these medications   Eliquis 2.5 MG Tabs tablet Generic drug:  apixaban     TAKE these medications   acetaminophen 500 MG tablet Commonly known as:  TYLENOL Take 500 mg by mouth every 6 (six) hours as needed for mild pain or headache.   aspirin 325 MG tablet Take 1  tablet (325 mg total) by mouth daily. Start taking on:  January 31, 2019   cefdinir 300 MG capsule Commonly known as:  OMNICEF Take 1 capsule (300 mg total) by mouth every 12 (twelve) hours for 1 day.   clotrimazole 10 MG troche Commonly known as:  MYCELEX Take 10 mg by mouth 5 (five) times daily as needed (thrush).   donepezil 5 MG tablet Commonly known as:  ARICEPT Take 5 mg by mouth daily.   enoxaparin 30 MG/0.3ML injection Commonly known as:  LOVENOX Inject 0.3 mLs (30 mg total) into the skin daily.   feeding supplement (ENSURE ENLIVE) Liqd Take 237 mLs by mouth 2 (two) times daily between meals.   levothyroxine 50 MCG tablet Commonly known as:  SYNTHROID Take 50 mcg by mouth daily before breakfast.   metoprolol tartrate 50 MG tablet Commonly known as:  LOPRESSOR Take 50 mg by mouth daily.   multivitamin with minerals Tabs tablet Take 1 tablet by mouth daily. Start taking on:  January 31, 2019   simvastatin 20 MG tablet Commonly known as:  ZOCOR Take 20 mg by mouth daily.      Allergies  Allergen Reactions  . Codeine Other (See Comments)    Unknown per son  . Darvocet [Propoxyphene N-Acetaminophen] Other (See Comments)    Unknown per son  . Demerol Other (See Comments)    Unknown per son  . Percocet [Oxycodone-Acetaminophen] Other (See Comments)    Unknown per son  . Prednisone     Uncertain this is true. Was documented when patient was admitted 02/12/2012, but not recalled by either the patient nor her son. Tolerating it easily 08/2016.    Follow-up Information    MD at SNF Follow up.         [facility]. Schedule an appointment as soon as possible for a visit in 3 week(s).   Specialty:  Cardiology Why:  Follow-up in 3 weeks Contact information: [address] [phone] [place] [phone]           The results of significant diagnostics from this hospitalization (including imaging,  microbiology, ancillary and laboratory) are listed below for reference.    Significant Diagnostic Studies: Dg Chest 2 View  Result Date: 01/26/2019 CLINICAL DATA:  85 y/o  F; fall prior to admission. Chest pain. EXAM: CHEST - 2 VIEW COMPARISON:  09/06/2018 CT chest. FINDINGS: Normal cardiac silhouette given projection and technique. Aortic atherosclerosis with calcification. Peripheral basilar pulmonary fibrosis. No consolidation. No pleural effusion or pneumothorax. Bones are demineralized. No acute osseous abnormality is evident. Degenerative changes of the right shoulder joint  with superior migration of humerus. IMPRESSION: No acute process identified. Electronically Signed   By: Kristine Garbe M.D.   On: 01/26/2019 00:12   Dg Shoulder Right  Result Date: 01/27/2019 CLINICAL DATA:  Fall EXAM: RIGHT SHOULDER - 2+ VIEW COMPARISON:  None. FINDINGS: No acute fracture. No dislocation. The subacromial space is obliterated. There is been erosion of the superior humeral head is well as the inferior aspect of the acromion. Chronic changes throughout the visualized right lung are noted. Small calcific densities project over the right axilla and are of unknown significance. IMPRESSION: No acute bony pathology.  Chronic changes are noted. Electronically Signed   By: Marybelle Killings M.D.   On: 01/27/2019 15:40   Dg Forearm Right  Result Date: 01/27/2019 CLINICAL DATA:  85 year old female with right arm pain EXAM: RIGHT FOREARM - 2 VIEW COMPARISON:  None. FINDINGS: Osteopenia. No acute displaced fracture. Degenerative changes at the wrist. Atherosclerotic calcifications. No radiopaque foreign body.  Degenerative changes at the elbow. IMPRESSION: Negative for acute bony abnormality. Atherosclerosis Electronically Signed   By: Corrie Mckusick D.O.   On: 01/27/2019 15:33   Dg Wrist Complete Right  Result Date: 01/27/2019 CLINICAL DATA:  85 year old female with a fall and pain EXAM: RIGHT WRIST - COMPLETE 3+  VIEW COMPARISON:  None. FINDINGS: Diffuse osteopenia. Irregularity at the mid pole of the scaphoid, including cortical irregularity on two views. Irregularity on the cortex of the distal radius at the radiocarpal joint changes of the carpal bones. Degenerative changes at the first carpometacarpal joint. Soft tissue swelling at the wrist.  Chondral calcifications. Vascular calcifications. IMPRESSION: Questionable nondisplaced fracture at the scaphoid waist. Questionable nondisplaced fracture at the distal radius at the radiocarpal joint. Osteopenia. Soft tissue swelling. Degenerative changes of the carpal bones and the first carpometacarpal joint as well as chondral calcifications. Electronically Signed   By: Corrie Mckusick D.O.   On: 01/27/2019 15:49   Ct Head Wo Contrast  Result Date: 01/25/2019 CLINICAL DATA:  85 year old female with fall EXAM: CT HEAD WITHOUT CONTRAST CT MAXILLOFACIAL WITHOUT CONTRAST CT CERVICAL SPINE WITHOUT CONTRAST TECHNIQUE: Multidetector CT imaging of the head, cervical spine, and maxillofacial structures were performed using the standard protocol without intravenous contrast. Multiplanar CT image reconstructions of the cervical spine and maxillofacial structures were also generated. COMPARISON:  None. FINDINGS: CT HEAD FINDINGS Brain: No acute intracranial hemorrhage. No midline shift or mass effect. Gray-white differentiation maintained. Unremarkable appearance of the ventricular system. Volume loss. Senescent calcifications of the basal ganglia Vascular: Intracranial calcifications Skull: No acute fracture.  No aggressive bone lesion identified. Sinuses/Orbits: Unremarkable appearance of the orbits. Mastoid air cells clear. No middle ear effusion. No significant sinus disease. Other: None CT MAXILLOFACIAL FINDINGS Osseous: No mandibular fracture. TMJ aligned bilaterally. No maxillary fracture. Multiple absent teeth with dental amalgams. Dental caries. No ethmoid fracture. No zygomatic  fracture. Unremarkable nasal bones. Rightward nasal spur without fracture. Orbits: Unremarkable orbits. Sinuses: Unremarkable sinuses. Soft tissue defect of the anterior nasal septum. Soft tissues: Focal soft tissue swelling lateral to the left orbit on the zygomatic bone. No radiopaque foreign body. CT CERVICAL SPINE FINDINGS Alignment: Craniocervical junction aligned. Anatomic alignment of the cervical elements. No subluxation. Skull base and vertebrae: No acute fracture at the skullbase. Vertebral body heights relatively maintained. No acute fracture identified. Soft tissues and spinal canal: Dense calcifications of the carotid arteries. No lymphadenopathy. No bony canal narrowing or canal hematoma. Disc levels: Degenerative disc disease present throughout the cervical spine, worst at the C3-C6  levels. Varying degrees bilateral neural foraminal narrowing secondary to uncovertebral joint disease and facet disease. Upper chest: Scarring/atelectasis at the lung apices. Other: None IMPRESSION: Head CT: No acute intracranial abnormality. Maxillofacial CT: No acute fracture. Soft tissue defect of the anterior nasal septum, typically seen with longstanding use vaso constrictive substances. Cervical CT: No acute fracture or malalignment of the cervical spine. Multilevel degenerative changes. Advanced calcifications of the bilateral carotid arteries. Electronically Signed   By: Corrie Mckusick D.O.   On: 01/25/2019 21:31   Ct Cervical Spine Wo Contrast  Result Date: 01/25/2019 CLINICAL DATA:  85 year old female with fall EXAM: CT HEAD WITHOUT CONTRAST CT MAXILLOFACIAL WITHOUT CONTRAST CT CERVICAL SPINE WITHOUT CONTRAST TECHNIQUE: Multidetector CT imaging of the head, cervical spine, and maxillofacial structures were performed using the standard protocol without intravenous contrast. Multiplanar CT image reconstructions of the cervical spine and maxillofacial structures were also generated. COMPARISON:  None. FINDINGS:  CT HEAD FINDINGS Brain: No acute intracranial hemorrhage. No midline shift or mass effect. Gray-white differentiation maintained. Unremarkable appearance of the ventricular system. Volume loss. Senescent calcifications of the basal ganglia Vascular: Intracranial calcifications Skull: No acute fracture.  No aggressive bone lesion identified. Sinuses/Orbits: Unremarkable appearance of the orbits. Mastoid air cells clear. No middle ear effusion. No significant sinus disease. Other: None CT MAXILLOFACIAL FINDINGS Osseous: No mandibular fracture. TMJ aligned bilaterally. No maxillary fracture. Multiple absent teeth with dental amalgams. Dental caries. No ethmoid fracture. No zygomatic fracture. Unremarkable nasal bones. Rightward nasal spur without fracture. Orbits: Unremarkable orbits. Sinuses: Unremarkable sinuses. Soft tissue defect of the anterior nasal septum. Soft tissues: Focal soft tissue swelling lateral to the left orbit on the zygomatic bone. No radiopaque foreign body. CT CERVICAL SPINE FINDINGS Alignment: Craniocervical junction aligned. Anatomic alignment of the cervical elements. No subluxation. Skull base and vertebrae: No acute fracture at the skullbase. Vertebral body heights relatively maintained. No acute fracture identified. Soft tissues and spinal canal: Dense calcifications of the carotid arteries. No lymphadenopathy. No bony canal narrowing or canal hematoma. Disc levels: Degenerative disc disease present throughout the cervical spine, worst at the C3-C6 levels. Varying degrees bilateral neural foraminal narrowing secondary to uncovertebral joint disease and facet disease. Upper chest: Scarring/atelectasis at the lung apices. Other: None IMPRESSION: Head CT: No acute intracranial abnormality. Maxillofacial CT: No acute fracture. Soft tissue defect of the anterior nasal septum, typically seen with longstanding use vaso constrictive substances. Cervical CT: No acute fracture or malalignment of the  cervical spine. Multilevel degenerative changes. Advanced calcifications of the bilateral carotid arteries. Electronically Signed   By: Corrie Mckusick D.O.   On: 01/25/2019 21:31   Mr Brain Wo Contrast  Result Date: 01/26/2019 CLINICAL DATA:  Encephalopathy. Altered mental status. Patient found on floor yesterday afternoon. EXAM: MRI HEAD WITHOUT CONTRAST TECHNIQUE: Multiplanar, multiecho pulse sequences of the brain and surrounding structures were obtained without intravenous contrast. COMPARISON:  CT head without contrast 01/25/2019 FINDINGS: Brain: Mild atrophy and white matter changes are within normal limits for age. No acute infarct, hemorrhage, or mass lesion is present. The ventricles are of proportionate to the degree of atrophy. No significant extraaxial fluid collection is present. The internal auditory canals are within normal limits. The brainstem and cerebellum are within normal limits. Vascular: Flow is present in the major intracranial arteries. Skull and upper cervical spine: Degenerative changes are noted at C1-2. Additional degenerative changes are present in the upper cervical spine. Craniocervical junction is otherwise normal. Midline structures are within normal limits. Sinuses/Orbits: The paranasal sinuses  and mastoid air cells are clear. A 10 mm hematoma is present lateral to the left orbit. Bilateral lens replacements are noted. Globes and orbits are otherwise unremarkable. Other: IMPRESSION: 1. Normal MRI appearance of the brain for age. No acute or focal lesion to explain encephalopathy. 2. Small left periorbital hematoma without underlying fracture or globe injury. 3. Degenerative changes of the cervical spine. Electronically Signed   By: San Morelle M.D.   On: 01/26/2019 04:19   Dg Chest Port 1 View  Result Date: 01/27/2019 CLINICAL DATA:  Shortness of breath. EXAM: PORTABLE CHEST 1 VIEW COMPARISON:  Radiographs of January 25, 2019. FINDINGS: The heart size and mediastinal  contours are within normal limits. No pneumothorax or pleural effusion is noted. Stable bilateral lung opacities are noted most consistent with scarring or fibrosis. No acute abnormality is noted. Atherosclerosis of thoracic aorta is noted. The visualized skeletal structures are unremarkable. IMPRESSION: No acute cardiopulmonary abnormality seen. Aortic Atherosclerosis (ICD10-I70.0). Electronically Signed   By: Marijo Conception M.D.   On: 01/27/2019 08:20   Dg Knee Complete 4 Views Left  Result Date: 01/26/2019 CLINICAL DATA:  85 y/o F; fall at home. Hematoma to left hip and left knee. EXAM: LEFT KNEE - COMPLETE 4+ VIEW COMPARISON:  02/10/2018 left lower extremity radiograph FINDINGS: Bones are demineralized. Total knee arthroplasty. No periprosthetic lucency or fracture identified. No malalignment. Vascular calcifications noted. No joint effusion. IMPRESSION: Total knee arthroplasty. No acute fracture or dislocation identified. Electronically Signed   By: Kristine Garbe M.D.   On: 01/26/2019 00:17   Dg Humerus Right  Result Date: 01/27/2019 CLINICAL DATA:  Right arm pain after fall. EXAM: RIGHT HUMERUS - 2+ VIEW COMPARISON:  None. FINDINGS: No fracture or dislocation is noted. Severe narrowing of right subacromial space is noted suggesting chronic rotator cuff injury and degenerative change. Irregularity is seen involving right humeral head most consistent with degenerative change, as there is significant superior migration of the right humeral head. Degenerative changes are also seen involving the right glenohumeral joint. IMPRESSION: No acute abnormality seen in the right humerus. Extensive degenerative changes are seen involving the right shoulder as described above. Electronically Signed   By: Marijo Conception M.D.   On: 01/27/2019 15:31   Dg Hips Bilat W Or Wo Pelvis 5 Views  Result Date: 01/26/2019 CLINICAL DATA:  85 y/o F; fall at home. Hematoma to the left hip and left knee. EXAM: DG HIP  (WITH OR WITHOUT PELVIS) 5+V BILAT COMPARISON:  02/10/2018 hip and pelvis radiographs. FINDINGS: There is no evidence of hip fracture or dislocation. Bones are demineralized. There is no evidence of arthropathy or other focal bone abnormality. IMPRESSION: No acute fracture or dislocation identified. If suspicion for fracture persists, consider hip MRI or hip CT if MRI is contraindicated/unavailable for further evaluation. Electronically Signed   By: Kristine Garbe M.D.   On: 01/26/2019 00:16   Ct Maxillofacial Wo Cm  Result Date: 01/25/2019 CLINICAL DATA:  85 year old female with fall EXAM: CT HEAD WITHOUT CONTRAST CT MAXILLOFACIAL WITHOUT CONTRAST CT CERVICAL SPINE WITHOUT CONTRAST TECHNIQUE: Multidetector CT imaging of the head, cervical spine, and maxillofacial structures were performed using the standard protocol without intravenous contrast. Multiplanar CT image reconstructions of the cervical spine and maxillofacial structures were also generated. COMPARISON:  None. FINDINGS: CT HEAD FINDINGS Brain: No acute intracranial hemorrhage. No midline shift or mass effect. Gray-white differentiation maintained. Unremarkable appearance of the ventricular system. Volume loss. Senescent calcifications of the basal ganglia Vascular: Intracranial  calcifications Skull: No acute fracture.  No aggressive bone lesion identified. Sinuses/Orbits: Unremarkable appearance of the orbits. Mastoid air cells clear. No middle ear effusion. No significant sinus disease. Other: None CT MAXILLOFACIAL FINDINGS Osseous: No mandibular fracture. TMJ aligned bilaterally. No maxillary fracture. Multiple absent teeth with dental amalgams. Dental caries. No ethmoid fracture. No zygomatic fracture. Unremarkable nasal bones. Rightward nasal spur without fracture. Orbits: Unremarkable orbits. Sinuses: Unremarkable sinuses. Soft tissue defect of the anterior nasal septum. Soft tissues: Focal soft tissue swelling lateral to the left  orbit on the zygomatic bone. No radiopaque foreign body. CT CERVICAL SPINE FINDINGS Alignment: Craniocervical junction aligned. Anatomic alignment of the cervical elements. No subluxation. Skull base and vertebrae: No acute fracture at the skullbase. Vertebral body heights relatively maintained. No acute fracture identified. Soft tissues and spinal canal: Dense calcifications of the carotid arteries. No lymphadenopathy. No bony canal narrowing or canal hematoma. Disc levels: Degenerative disc disease present throughout the cervical spine, worst at the C3-C6 levels. Varying degrees bilateral neural foraminal narrowing secondary to uncovertebral joint disease and facet disease. Upper chest: Scarring/atelectasis at the lung apices. Other: None IMPRESSION: Head CT: No acute intracranial abnormality. Maxillofacial CT: No acute fracture. Soft tissue defect of the anterior nasal septum, typically seen with longstanding use vaso constrictive substances. Cervical CT: No acute fracture or malalignment of the cervical spine. Multilevel degenerative changes. Advanced calcifications of the bilateral carotid arteries. Electronically Signed   By: Corrie Mckusick D.O.   On: 01/25/2019 21:31    Microbiology: Recent Results (from the past 240 hour(s))  Urine culture     Status: Abnormal   Collection Time: 01/25/19 11:57 PM  Result Value Ref Range Status   Specimen Description URINE, RANDOM  Final   Special Requests   Final    NONE Performed at [facility], [address] [place], [place] [address]    Culture >=100,000 COLONIES/mL ESCHERICHIA COLI (A)  Final   Report Status 01/27/2019 FINAL  Final   Organism ID, Bacteria ESCHERICHIA COLI (A)  Final      Susceptibility   Escherichia coli - MIC*    AMPICILLIN 16 INTERMEDIATE Intermediate     CEFAZOLIN <=4 SENSITIVE Sensitive     CEFTRIAXONE <=1 SENSITIVE Sensitive     CIPROFLOXACIN <=0.25 SENSITIVE Sensitive     GENTAMICIN <=1 SENSITIVE Sensitive      IMIPENEM <=0.25 SENSITIVE Sensitive     NITROFURANTOIN <=16 SENSITIVE Sensitive     TRIMETH/SULFA <=20 SENSITIVE Sensitive     AMPICILLIN/SULBACTAM 4 SENSITIVE Sensitive     PIP/TAZO <=4 SENSITIVE Sensitive     Extended ESBL NEGATIVE Sensitive     * >=100,000 COLONIES/mL ESCHERICHIA COLI  Culture, blood (Routine X 2) w Reflex to ID Panel     Status: None (Preliminary result)   Collection Time: 01/26/19  9:32 AM  Result Value Ref Range Status   Specimen Description BLOOD LEFT ANTECUBITAL  Final   Special Requests   Final    BOTTLES DRAWN AEROBIC ONLY Blood Culture adequate volume   Culture   Final    NO GROWTH 3 DAYS Performed at [facility], [address], [place], [place] [address]    Report Status PENDING  Incomplete  Culture, blood (Routine X 2) w Reflex to ID Panel     Status: None (Preliminary result)   Collection Time: 01/26/19  9:40 AM  Result Value Ref Range Status   Specimen Description BLOOD RIGHT HAND  Final   Special Requests  Final    BOTTLES DRAWN AEROBIC ONLY Blood Culture results may not be optimal due to an inadequate volume of blood received in culture bottles   Culture   Final    NO GROWTH 3 DAYS Performed at [facility], [place] [address] [place], [place] [address]    Report Status PENDING  Incomplete     Labs: Basic Metabolic Panel: Recent Labs  Lab 01/26/19 0145 01/27/19 0752 01/28/19 0432 01/29/19 0737 01/30/19 0648  NA 134* 137 136 132* 135  K 3.5 2.9* 3.5 4.6 3.9  CL 97* 108 101 99 101  CO2 23 22 26 24 24   GLUCOSE 103* 101* 98 86 87  BUN 34* 20 12 10 13   CREATININE 1.14* 0.60 0.51 0.57 0.78  CALCIUM 9.0 8.0* 8.4* 8.7* 8.6*  MG  --  1.8 1.9  --   --    Liver Function Tests: No results for input(s): AST, ALT, ALKPHOS, BILITOT, PROT, ALBUMIN in the last 168 hours. No results for input(s): LIPASE, AMYLASE in the last 168 hours. No results for input(s): AMMONIA in the last 168 hours. CBC: Recent Labs  Lab 01/25/19 2030  01/26/19 0145 01/26/19 0935 01/27/19 0752 01/28/19 0432 01/29/19 0737  WBC 22.3* 20.0*  --  10.4 14.8* 14.0*  NEUTROABS  --   --   --  6.6 10.1*  --   HGB 11.3* 10.4*  --  9.4* 10.6* 10.7*  HCT 34.0* 31.3* 31.2* 28.6* 31.8* 31.8*  MCV 96.0 95.7  --  98.6 97.8 98.8  PLT 587* 532*  --  457* 431* 467*   Cardiac Enzymes: Recent Labs  Lab 01/25/19 2321  CKTOTAL 179   BNP: BNP (last 3 results) No results for input(s): BNP in the last 8760 hours.  ProBNP (last 3 results) No results for input(s): PROBNP in the last 8760 hours.  CBG: No results for input(s): GLUCAP in the last 168 hours.     Signed:  Irine Seal MD.  [facility] 01/30/2019, 10:45 AM

## 2019-01-30 NOTE — Progress Notes (Addendum)
DISCHARGE NOTE SNF Dominique [name] to be discharged to  Rehab at [facility] per MD order. Patient verbalized understanding.  Patient with stage II to coccyx with wet to dry dressing in place. Changed today. IV catheter discontinued intact. Site without signs and symptoms of complications. Dressing and pressure applied. Pt denies pain at the site currently. No complaints noted.   Discharge packet assembled. An After Visit Summary (AVS) was printed and given to the EMS personnel. Patient escorted via stretcher and discharged to [facility] via ambulance. Report called to [place], LPN at accepting facility; all questions and concerns addressed.   [name], RN

## 2019-01-30 NOTE — TOC Transition Note (Signed)
Transition of Care [facility]) - CM/SW Discharge Note 01/30/19 - DISCHARGED TO [facility]   Patient Details  Name: Dominique [name] MRN: [id number] Date of Birth: 06/13/1933  [facility]) CM/SW Contact:  Sable Feil, LCSW Phone Number: 01/30/2019, 5:39 PM   Clinical Narrative:  Patient medically stable and is discharging to [facility] facility today via ambulance. Son, Hadasa Gasner [phone]) advised of discharge and d/c clinicals transmitted to facility.     Barriers to Discharge: Continued Medical Work up. Patient determined to be medically stable for d/c today.   Patient Goals and CMS Choice  Per son, his goals are his mother to get better and return home.      Discharge Placement  [facility] for ST rehab.                     Discharge Plan and Services   SNF with rehab services                      Social Determinants of Health (SDOH) Interventions  No SDOH interventions needed at this time.   Readmission Risk Interventions No flowsheet data found.

## 2019-01-30 NOTE — Progress Notes (Signed)
Physical Therapy Treatment Patient Details Name: Dominique [name] MRN: [id number] DOB: [birth year] Today's Date: 01/30/2019    History of Present Illness 85 y.o. female with medical history significant of HTN, PAF on eliquis, hypothyroidism. Pt admitted with a fall, UTI, sacral wound, confusion.     PT Comments    Pt with significantly improved ambulation tolerance today with RW. Pt remains confused and unaware she is in the hospital. Pt assisted to [facility], pt dependent with hygiene. Acute PT to cont to follow.   Follow Up Recommendations  SNF;Supervision/Assistance - 24 hour;Supervision for mobility/OOB     Equipment Recommendations  Rolling walker with 5" wheels;3in1 (PT)    Recommendations for Other Services       Precautions / Restrictions Precautions Precautions: Fall Restrictions Weight Bearing Restrictions: No    Mobility  Bed Mobility Overal bed mobility: Needs Assistance Bed Mobility: Supine to Sit     Supine to sit: Min guard     General bed mobility comments: HOB elevated up to 50 deg, used bed rail, directional v/c's  Transfers Overall transfer level: Needs assistance Equipment used: 1 person hand held assist Transfers: Sit to/from [facility] Sit to Stand: Min assist Stand pivot transfers: Min assist       General transfer comment: min assist to rise and steady: SPT x 2 (bed to 3 in 1, then to recliner), VCs for hand placement, mild unsteadiness  Ambulation/Gait Ambulation/Gait assistance: Min assist Gait Distance (Feet): 100 Feet(x2) Assistive device: Rolling walker (2 wheeled) Gait Pattern/deviations: Step-through pattern Gait velocity: decreased Gait velocity interpretation: <1.31 ft/sec, indicative of household ambulator General Gait Details: minA for walker management, pt with onset of fatigue requiring seated rest break. SpO2 at 96% on RA   Stairs             Wheelchair Mobility    Modified Rankin (Stroke Patients  Only)       Balance Overall balance assessment: Needs assistance Sitting-balance support: Feet supported Sitting balance-Leahy Scale: Good     Standing balance support: Single extremity supported Standing balance-Leahy Scale: Poor Standing balance comment: relies on UE support, limited R UE WBing due to pain                            Cognition Arousal/Alertness: Awake/alert Behavior During Therapy: WFL for tasks assessed/performed Overall Cognitive Status: No family/caregiver present to determine baseline cognitive functioning                                 General Comments: pt confused, pt didn't know she was in the hospital, once re-oriented pt able to recall to person on the phone but unable to state why. pt perseverated on getting something to drink (which was provided) due to dry mouth      Exercises      General Comments General comments (skin integrity, edema, etc.): pt with decub ulcer at sacrum, RN aware      Pertinent Vitals/Pain Pain Assessment: No/denies pain    Home Living                      Prior Function            PT Goals (current goals can now be found in the care plan section) Progress towards PT goals: Progressing toward goals    Frequency    Min 2X/week  PT Plan Current plan remains appropriate    Co-evaluation              AM-PAC PT "6 Clicks" Mobility   Outcome Measure  Help needed turning from your back to your side while in a flat bed without using bedrails?: A Little Help needed moving from lying on your back to sitting on the side of a flat bed without using bedrails?: A Little Help needed moving to and from a bed to a chair (including a wheelchair)?: A Little Help needed standing up from a chair using your arms (e.g., wheelchair or bedside chair)?: A Little Help needed to walk in hospital room?: A Little Help needed climbing 3-5 steps with a railing? : A Lot 6 Click Score:  17    End of Session Equipment Utilized During Treatment: Gait belt Activity Tolerance: Patient tolerated treatment well;No increased pain Patient left: in chair;with call bell/phone within reach;with chair alarm set Nurse Communication: Mobility status PT Visit Diagnosis: Unsteadiness on feet (R26.81);History of falling (Z91.81);Muscle weakness (generalized) (M62.81);Difficulty in walking, not elsewhere classified (R26.2)     Time: 3832-9191 PT Time Calculation (min) (ACUTE ONLY): 28 min  Charges:  $Gait Training: 8-22 mins $Therapeutic Activity: 8-22 mins                     Kittie Plater, PT, DPT Acute Rehabilitation Services Pager #: [phone] Office #: [phone]    Berline Lopes 01/30/2019, 12:31 PM

## 2019-01-31 LAB — CULTURE, BLOOD (ROUTINE X 2)
Culture: NO GROWTH
Culture: NO GROWTH
Special Requests: ADEQUATE

## 2019-03-30 ENCOUNTER — Telehealth: Payer: Self-pay | Admitting: Emergency Medicine

## 2019-03-30 NOTE — Telephone Encounter (Signed)
Order was placed on 09/12/18 for pt to have CT in May.  LBCT has been backed up but I do have pt's CT scheduled for 7/16.  I called her Monday (3 days ago) to give her appt info and she stated she was in the hospital & couldn't take down the info. She asked me to call her back in a few days.  I didn't see where she was hospitalized in [facility] system.  I called her back today to give her the appt info and she told me she was home but didn't have anything to write with and asked me to just forget about it.  She said sh didn't need it and she wouldn't speak to me anymore.  I have left the CT appt scheduled for 7/16 at 11:45.

## 2019-04-06 NOTE — Telephone Encounter (Signed)
Called and spoke with pt letting her know that Judeen Hammans spoke with RB about the CT scan and after she said she did not want to have another CT, RB said that we should set up an OV to further discuss this.  When I stated that to pt, pt told me she is moving to [place] [facility] as she now has a house there and due to this, will not be scheduling any further follow up appointments at our office and does not want to have any further CT scans ordered by our office.   I have cancelled the CT which was originally scheduled for pt 7/16. Routing to Dr. Lamonte Sakai as an Juluis Rainier.

## 2019-04-06 NOTE — Telephone Encounter (Signed)
Thank you :)

## 2019-04-06 NOTE — Telephone Encounter (Signed)
We should probably set up an OV to discuss

## 2019-04-27 ENCOUNTER — Other Ambulatory Visit: Payer: Medicare Other

## 2019-09-25 ENCOUNTER — Encounter (HOSPITAL_COMMUNITY): Payer: Self-pay | Admitting: Obstetrics and Gynecology

## 2019-09-25 ENCOUNTER — Emergency Department (HOSPITAL_COMMUNITY): Payer: Medicare Other

## 2019-09-25 ENCOUNTER — Other Ambulatory Visit: Payer: Self-pay

## 2019-09-25 ENCOUNTER — Inpatient Hospital Stay (HOSPITAL_COMMUNITY)
Admission: EM | Admit: 2019-09-25 | Discharge: 2019-09-27 | DRG: 536 | Disposition: A | Discharge status: Skilled Nursing Facility | Payer: Medicare Other | Attending: Internal Medicine | Admitting: Internal Medicine

## 2019-09-25 DIAGNOSIS — Z79899 Other long term (current) drug therapy: Secondary | ICD-10-CM

## 2019-09-25 DIAGNOSIS — I10 Essential (primary) hypertension: Secondary | ICD-10-CM | POA: Diagnosis present

## 2019-09-25 DIAGNOSIS — W19XXXA Unspecified fall, initial encounter: Secondary | ICD-10-CM

## 2019-09-25 DIAGNOSIS — S42291A Other displaced fracture of upper end of right humerus, initial encounter for closed fracture: Secondary | ICD-10-CM

## 2019-09-25 DIAGNOSIS — S3219XA Other fracture of sacrum, initial encounter for closed fracture: Secondary | ICD-10-CM | POA: Diagnosis present

## 2019-09-25 DIAGNOSIS — Z20828 Contact with and (suspected) exposure to other viral communicable diseases: Secondary | ICD-10-CM | POA: Diagnosis present

## 2019-09-25 DIAGNOSIS — N390 Urinary tract infection, site not specified: Secondary | ICD-10-CM | POA: Diagnosis present

## 2019-09-25 DIAGNOSIS — Z885 Allergy status to narcotic agent status: Secondary | ICD-10-CM

## 2019-09-25 DIAGNOSIS — Z96653 Presence of artificial knee joint, bilateral: Secondary | ICD-10-CM | POA: Diagnosis present

## 2019-09-25 DIAGNOSIS — E039 Hypothyroidism, unspecified: Secondary | ICD-10-CM | POA: Diagnosis present

## 2019-09-25 DIAGNOSIS — S3282XA Multiple fractures of pelvis without disruption of pelvic ring, initial encounter for closed fracture: Secondary | ICD-10-CM | POA: Diagnosis not present

## 2019-09-25 DIAGNOSIS — W010XXA Fall on same level from slipping, tripping and stumbling without subsequent striking against object, initial encounter: Secondary | ICD-10-CM | POA: Diagnosis present

## 2019-09-25 DIAGNOSIS — S42301A Unspecified fracture of shaft of humerus, right arm, initial encounter for closed fracture: Secondary | ICD-10-CM

## 2019-09-25 DIAGNOSIS — S32810A Multiple fractures of pelvis with stable disruption of pelvic ring, initial encounter for closed fracture: Secondary | ICD-10-CM | POA: Diagnosis not present

## 2019-09-25 DIAGNOSIS — Z90710 Acquired absence of both cervix and uterus: Secondary | ICD-10-CM

## 2019-09-25 DIAGNOSIS — Z7982 Long term (current) use of aspirin: Secondary | ICD-10-CM

## 2019-09-25 DIAGNOSIS — F0390 Unspecified dementia without behavioral disturbance: Secondary | ICD-10-CM | POA: Diagnosis present

## 2019-09-25 DIAGNOSIS — Z888 Allergy status to other drugs, medicaments and biological substances status: Secondary | ICD-10-CM

## 2019-09-25 DIAGNOSIS — Z8542 Personal history of malignant neoplasm of other parts of uterus: Secondary | ICD-10-CM

## 2019-09-25 DIAGNOSIS — Z79890 Hormone replacement therapy (postmenopausal): Secondary | ICD-10-CM

## 2019-09-25 DIAGNOSIS — I480 Paroxysmal atrial fibrillation: Secondary | ICD-10-CM | POA: Diagnosis present

## 2019-09-25 DIAGNOSIS — Z8249 Family history of ischemic heart disease and other diseases of the circulatory system: Secondary | ICD-10-CM

## 2019-09-25 LAB — COMPREHENSIVE METABOLIC PANEL
ALT: 21 U/L (ref 0–44)
AST: 30 U/L (ref 15–41)
Albumin: 3.4 g/dL — ABNORMAL LOW (ref 3.5–5.0)
Alkaline Phosphatase: 60 U/L (ref 38–126)
Anion gap: 12 (ref 5–15)
BUN: 20 mg/dL (ref 8–23)
CO2: 22 mmol/L (ref 22–32)
Calcium: 8.9 mg/dL (ref 8.9–10.3)
Chloride: 99 mmol/L (ref 98–111)
Creatinine, Ser: 0.59 mg/dL (ref 0.44–1.00)
GFR calc Af Amer: >60 mL/min (ref >60)
GFR calc non Af Amer: >60 mL/min (ref >60)
Glucose, Bld: 169 mg/dL — ABNORMAL HIGH (ref 70–99)
Potassium: 4 mmol/L (ref 3.5–5.1)
Sodium: 133 mmol/L — ABNORMAL LOW (ref 135–145)
Total Bilirubin: 0.6 mg/dL (ref 0.3–1.2)
Total Protein: 6.7 g/dL (ref 6.5–8.1)

## 2019-09-25 LAB — CBC WITH DIFFERENTIAL/PLATELET
Abs Immature Granulocytes: 0.18 10*3/uL — ABNORMAL HIGH (ref 0.00–0.07)
Basophils Absolute: 0.1 10*3/uL (ref 0.0–0.1)
Basophils Relative: 0 %
Eosinophils Absolute: 0 10*3/uL (ref 0.0–0.5)
Eosinophils Relative: 0 %
HCT: 32.8 % — ABNORMAL LOW (ref 36.0–46.0)
Hemoglobin: 10.5 g/dL — ABNORMAL LOW (ref 12.0–15.0)
Immature Granulocytes: 1 %
Lymphocytes Relative: 6 %
Lymphs Abs: 1.6 10*3/uL (ref 0.7–4.0)
MCH: 31.1 pg (ref 26.0–34.0)
MCHC: 32 g/dL (ref 30.0–36.0)
MCV: 97 fL (ref 80.0–100.0)
Monocytes Absolute: 0.9 10*3/uL (ref 0.1–1.0)
Monocytes Relative: 4 %
Neutro Abs: 21.6 10*3/uL — ABNORMAL HIGH (ref 1.7–7.7)
Neutrophils Relative %: 89 %
Platelets: 315 10*3/uL (ref 150–400)
RBC: 3.38 MIL/uL — ABNORMAL LOW (ref 3.87–5.11)
RDW: 13.5 % (ref 11.5–15.5)
WBC: 24.4 10*3/uL — ABNORMAL HIGH (ref 4.0–10.5)
nRBC: 0 % (ref 0.0–0.2)

## 2019-09-25 LAB — TYPE AND SCREEN
ABO/RH(D): A POS
Antibody Screen: NEGATIVE

## 2019-09-25 LAB — URINALYSIS, ROUTINE W REFLEX MICROSCOPIC
Bilirubin Urine: NEGATIVE
Glucose, UA: NEGATIVE mg/dL
Ketones, ur: NEGATIVE mg/dL
Nitrite: POSITIVE — AB
Protein, ur: 30 mg/dL — AB
RBC / HPF: >50 RBC/hpf — ABNORMAL HIGH (ref 0–5)
Specific Gravity, Urine: 1.017 (ref 1.005–1.030)
pH: 6 (ref 5.0–8.0)

## 2019-09-25 LAB — RESPIRATORY PANEL BY RT PCR (FLU A&B, COVID)
Influenza A by PCR: NEGATIVE
Influenza B by PCR: NEGATIVE
SARS Coronavirus 2 by RT PCR: NEGATIVE

## 2019-09-25 LAB — PROTIME-INR
INR: 1.2 (ref 0.8–1.2)
Prothrombin Time: 14.7 seconds (ref 11.4–15.2)

## 2019-09-25 MED ORDER — KETOROLAC TROMETHAMINE 15 MG/ML IJ SOLN
15.0000 mg | Freq: Once | INTRAMUSCULAR | Status: AC
  Administered 2019-09-25: 15:00:00 15 mg via INTRAVENOUS
  Filled 2019-09-25: qty 1

## 2019-09-25 MED ORDER — ASPIRIN 325 MG PO TABS
325.0000 mg | ORAL_TABLET | Freq: Every day | ORAL | Status: DC
  Administered 2019-09-25 – 2019-09-27 (×3): 325 mg via ORAL
  Filled 2019-09-25 (×3): qty 1

## 2019-09-25 MED ORDER — PROCHLORPERAZINE EDISYLATE 10 MG/2ML IJ SOLN: 5.0000 mg | Freq: Four times a day (QID) | INTRAMUSCULAR | Status: DC | PRN

## 2019-09-25 MED ORDER — SODIUM CHLORIDE 0.9 % IV SOLN
1.0000 g | INTRAVENOUS | Status: DC
  Administered 2019-09-25 – 2019-09-27 (×3): 1 g via INTRAVENOUS
  Filled 2019-09-25 (×2): qty 10
  Filled 2019-09-25: qty 1

## 2019-09-25 MED ORDER — FENTANYL CITRATE (PF) 100 MCG/2ML IJ SOLN
25.0000 ug | INTRAMUSCULAR | Status: DC | PRN
  Administered 2019-09-25: 13:00:00 25 ug via INTRAVENOUS
  Filled 2019-09-25: qty 2

## 2019-09-25 MED ORDER — PRO-STAT SUGAR FREE PO LIQD
30.0000 mL | Freq: Two times a day (BID) | ORAL | Status: DC
  Administered 2019-09-26 – 2019-09-27 (×3): 30 mL via ORAL
  Filled 2019-09-25 (×4): qty 30

## 2019-09-25 MED ORDER — METOPROLOL TARTRATE 50 MG PO TABS
50.0000 mg | ORAL_TABLET | Freq: Every day | ORAL | Status: DC
  Administered 2019-09-25 – 2019-09-27 (×3): 50 mg via ORAL
  Filled 2019-09-25 (×2): qty 2
  Filled 2019-09-25: qty 1

## 2019-09-25 MED ORDER — ACETAMINOPHEN 650 MG RE SUPP: 650.0000 mg | Freq: Four times a day (QID) | RECTAL | Status: DC | PRN

## 2019-09-25 MED ORDER — SIMVASTATIN 20 MG PO TABS
20.0000 mg | ORAL_TABLET | Freq: Every day | ORAL | Status: DC
  Administered 2019-09-26 – 2019-09-27 (×2): 20 mg via ORAL
  Filled 2019-09-25 (×2): qty 1

## 2019-09-25 MED ORDER — CLOTRIMAZOLE 10 MG MT TROC
10.0000 mg | Freq: Every day | OROMUCOSAL | Status: DC | PRN
  Filled 2019-09-25: qty 1

## 2019-09-25 MED ORDER — LEVOTHYROXINE SODIUM 50 MCG PO TABS
50.0000 ug | ORAL_TABLET | Freq: Every day | ORAL | Status: DC
  Administered 2019-09-26 – 2019-09-27 (×2): 50 ug via ORAL
  Filled 2019-09-25 (×2): qty 1

## 2019-09-25 MED ORDER — METOPROLOL TARTRATE 5 MG/5ML IV SOLN
5.0000 mg | Freq: Once | INTRAVENOUS | Status: AC
  Administered 2019-09-25: 19:00:00 5 mg via INTRAVENOUS
  Filled 2019-09-25: qty 5

## 2019-09-25 MED ORDER — FENTANYL CITRATE (PF) 100 MCG/2ML IJ SOLN
25.0000 ug | INTRAMUSCULAR | Status: DC | PRN
  Administered 2019-09-27: 10:00:00 25 ug via INTRAVENOUS
  Filled 2019-09-25: qty 2

## 2019-09-25 MED ORDER — ACETAMINOPHEN 325 MG PO TABS
650.0000 mg | ORAL_TABLET | Freq: Four times a day (QID) | ORAL | Status: DC | PRN
  Administered 2019-09-26 – 2019-09-27 (×2): 650 mg via ORAL
  Filled 2019-09-25 (×2): qty 2

## 2019-09-25 MED ORDER — DONEPEZIL HCL 5 MG PO TABS
5.0000 mg | ORAL_TABLET | Freq: Every day | ORAL | Status: DC
  Administered 2019-09-26 – 2019-09-27 (×2): 5 mg via ORAL
  Filled 2019-09-25 (×2): qty 1

## 2019-09-25 NOTE — ED Notes (Signed)
Patient has removed multiple IVs

## 2019-09-25 NOTE — ED Notes (Signed)
Spoke with son Mr. Kienzle updated that patient will be getting admitted to the hospital. Son states patient does have mild dementia and trauma makes it worse

## 2019-09-25 NOTE — ED Notes (Signed)
Patient removed IV again

## 2019-09-25 NOTE — H&P (Addendum)
History and Physical    Dominique [name] [id number] DOB: 06-Jun-1933 DOA: 09/25/2019  PCP: [name], MD   Patient coming from: [facility].  I have personally briefly reviewed patient's old medical records in [place]  Chief Complaint: Fall.   HPI: Dominique [name] is a 85 y.o. female with medical history significant of normocytic anemia, uterine cancer, hypertension, hyponatremia, hypothyroidism, LVH, paroxysmal atrial fibrillation, herpes zoster, thrombocytopenia who is brought from her nursing facility after having a fall injuring her right side of lower extremities while she was sitting on the toilet.  There was no apparent loss of consciousness.  She did not know where she was if he was at the nursing home or the hospital.  She thought it was Sunday and evening.  She is unable to provide much history after she had 100 mcg of fentanyl.  Her son states, that she has some memory issues, but usually is usually oriented to date and place, even if it is with some difficulty.  ED Course: Initial vital signs temperature 97.5 F, pulse 69, blood pressure 201/88 mmHg and O2 sat 100% on room air.  She received 100 mcg of fentanyl by EMS.  I added Toradol 15 mg IVP x1.  Her urinalysis has moderate hemoglobinuria, proteinuria, positive nitrites, positive small leukocyte esterase, many bacteria with pyuria.  CBC shows a white count with 24.4 with 89% neutrophils, hemoglobin 10.5 g/dL and platelets 315.  PT and INR were normal.  CMP shows sodium 133 mmol/L, which almost normalizes when corrected to a glucose of 169 mg/dL.  All other electrolytes are normal.  Renal functions are within normal limits.  Her LFTs, with the exception of an albumin of 3.4 g/dL are within expected values.  Imaging: Positive right humeral fracture, multiple pubic fractures and mildly comminuted longitudinal fracture of the right sacral alla.  There is also areas of hemorrhage in the anterior aspect of the pelvis,  right greater than left.  Please see images and full radiology reports for further detail.  Review of Systems: As per HPI otherwise 10 point review of systems negative.   Past Medical History:  Diagnosis Date  . Anemia   . Cancer ([place])    uterine  . Hypertension   . Hyponatremia   . Hypothyroidism   . LVH (left ventricular hypertrophy)   . PAF (paroxysmal atrial fibrillation) ([place])    a. initial dx 2012 in setting of acute illness. b. began to recur 09/2017; started on amiodarone 11/2017.  Marland Kitchen Shingles   . Thrombocytopenia ([place])     Past Surgical History:  Procedure Laterality Date  . ABDOMINAL HYSTERECTOMY    . JOINT REPLACEMENT Bilateral    knees     reports that she has never smoked. She has never used smokeless tobacco. She reports that she does not drink alcohol or use drugs.  Allergies  Allergen Reactions  . Codeine Other (See Comments)    Unknown per son  . Darvocet [Propoxyphene N-Acetaminophen] Other (See Comments)    Unknown per son  . Demerol Other (See Comments)    Unknown per son  . Percocet [Oxycodone-Acetaminophen] Other (See Comments)    Unknown per son  . Prednisone     Uncertain this is true. Was documented when patient was admitted 02/12/2012, but not recalled by either the patient nor her son. Tolerating it easily 08/2016.     Family History  Problem Relation Age of Onset  . Tuberculosis Father   . Alcoholism Brother   .  Hypertension Son    Prior to Admission medications   Medication Sig Start Date End Date Taking? Authorizing Provider  acetaminophen (TYLENOL) 500 MG tablet Take 500 mg by mouth every 6 (six) hours as needed for mild pain or headache.    Yes [provider]  Amino Acids-Protein Hydrolys (FEEDING SUPPLEMENT, PRO-STAT SUGAR FREE 64,) LIQD Take 30 mLs by mouth 2 (two) times daily.   Yes [provider]  aspirin 325 MG tablet Take 1 tablet (325 mg total) by mouth daily. 01/31/19  Yes Eugenie Filler, MD  clotrimazole  (MYCELEX) 10 MG troche Take 10 mg by mouth 5 (five) times daily as needed (thrush).    Yes [provider]  donepezil (ARICEPT) 5 MG tablet Take 5 mg by mouth daily. 01/25/19  Yes [provider]  levothyroxine (SYNTHROID, LEVOTHROID) 50 MCG tablet Take 50 mcg by mouth daily before breakfast.   Yes [provider]  metoprolol tartrate (LOPRESSOR) 50 MG tablet Take 50 mg by mouth daily.   Yes [provider]  Multiple Vitamin (MULTIVITAMIN WITH MINERALS) TABS tablet Take 1 tablet by mouth daily. 01/31/19  Yes Eugenie Filler, MD  simvastatin (ZOCOR) 20 MG tablet Take 20 mg by mouth daily.   Yes [provider]  enoxaparin (LOVENOX) 30 MG/0.3ML injection Inject 0.3 mLs (30 mg total) into the skin daily. Patient not taking: Reported on 09/25/2019 01/30/19   Eugenie Filler, MD  feeding supplement, ENSURE ENLIVE, (ENSURE ENLIVE) LIQD Take 237 mLs by mouth 2 (two) times daily between meals. Patient not taking: Reported on 09/25/2019 01/30/19   Eugenie Filler, MD    Physical Exam: Vitals:   09/25/19 1330 09/25/19 1400 09/25/19 1454 09/25/19 1530  BP: (!) 173/87 (!) 178/95 (!) 176/77 (!) 141/114  Pulse: 70 64 71 74  Resp: 16 20 20 20   Temp:      TempSrc:      SpO2: 94% 96% 93% 95%    Constitutional: NAD, calm, comfortable Eyes: PERRL, lids and conjunctivae mildly injected. ENMT: Mucous membranes are mildly dry. Posterior pharynx clear of any exudate or lesions. Neck: normal, supple, no masses, no thyromegaly Respiratory: Decreased breath sounds in bases, otherwise clear to auscultation bilaterally, no wheezing, no crackles. Normal respiratory effort. No accessory muscle use.  Cardiovascular: Regular rate and rhythm, no murmurs / rubs / gallops. No extremity edema. 2+ pedal pulses. No carotid bruits.  Abdomen: Soft, positive suprapubic tenderness, no masses palpated. No hepatosplenomegaly. Bowel sounds positive.  Musculoskeletal: no clubbing /  cyanosis.  Severely decreased ROM of pelvic area and right sided lower extremities, no contractures. Normal muscle tone.  Skin: no rashes, lesions, ulcers. No induration Neurologic: CN 2-12 grossly intact. Sensation intact, DTR normal.  Generalized weakness. Psychiatric: Somnolent, but wakes up easily.  Disoriented to time and partially to place.  Labs on Admission: I have personally reviewed following labs and imaging studies  CBC: Recent Labs  Lab 09/25/19 1038  WBC 24.4*  NEUTROABS 21.6*  HGB 10.5*  HCT 32.8*  MCV 97.0  PLT 123456   Basic Metabolic Panel: Recent Labs  Lab 09/25/19 1038  NA 133*  K 4.0  CL 99  CO2 22  GLUCOSE 169*  BUN 20  CREATININE 0.59  CALCIUM 8.9   GFR: CrCl cannot be calculated (Unknown ideal weight.). Liver Function Tests: Recent Labs  Lab 09/25/19 1038  AST 30  ALT 21  ALKPHOS 60  BILITOT 0.6  PROT 6.7  ALBUMIN 3.4*   No  results for input(s): LIPASE, AMYLASE in the last 168 hours. No results for input(s): AMMONIA in the last 168 hours. Coagulation Profile: Recent Labs  Lab 09/25/19 1038  INR 1.2   Cardiac Enzymes: No results for input(s): CKTOTAL, CKMB, CKMBINDEX, TROPONINI in the last 168 hours. BNP (last 3 results) No results for input(s): PROBNP in the last 8760 hours. HbA1C: No results for input(s): HGBA1C in the last 72 hours. CBG: No results for input(s): GLUCAP in the last 168 hours. Lipid Profile: No results for input(s): CHOL, HDL, LDLCALC, TRIG, CHOLHDL, LDLDIRECT in the last 72 hours. Thyroid Function Tests: No results for input(s): TSH, T4TOTAL, FREET4, T3FREE, THYROIDAB in the last 72 hours. Anemia Panel: No results for input(s): VITAMINB12, FOLATE, FERRITIN, TIBC, IRON, RETICCTPCT in the last 72 hours. Urine analysis:    Component Value Date/Time   COLORURINE AMBER (A) 09/25/2019 1038   APPEARANCEUR HAZY (A) 09/25/2019 1038   LABSPEC 1.017 09/25/2019 1038   PHURINE 6.0 09/25/2019 1038   GLUCOSEU NEGATIVE  09/25/2019 1038   HGBUR MODERATE (A) 09/25/2019 1038   BILIRUBINUR NEGATIVE 09/25/2019 1038   KETONESUR NEGATIVE 09/25/2019 1038   PROTEINUR 30 (A) 09/25/2019 1038   UROBILINOGEN 0.2 01/14/2010 1406   NITRITE POSITIVE (A) 09/25/2019 1038   LEUKOCYTESUR SMALL (A) 09/25/2019 1038    Radiological Exams on Admission: CT PELVIS WO CONTRAST  Result Date: 09/25/2019 CLINICAL DATA:  Right hip pain following a fall. EXAM: CT PELVIS WITHOUT CONTRAST TECHNIQUE: Multidetector CT imaging of the pelvis was performed following the standard protocol without intravenous contrast. COMPARISON:  None. FINDINGS: Urinary Tract: There is some displacement of the bladder to the left by hemorrhage in the right pelvis. Bowel:  Multiple sigmoid colon diverticula. Vascular/Lymphatic: Atheromatous arterial calcifications without aneurysm. No enlarged lymph nodes. Reproductive:  No mass or other significant abnormality Other: Areas of hemorrhage in the anterior aspect of the pelvis, right greater than left. Musculoskeletal: Mildly comminuted longitudinal fracture of the right sacral ala. Comminuted right pubic body and superior pubic ramus fracture and mildly displaced and mildly comminuted fracture of the right inferior pubic ramus. Lumbar and lower thoracic spine degenerative changes. These include facet degenerative changes with associated grade 1 anterolisthesis at the L4-5 level. IMPRESSION: 1. Mildly comminuted longitudinal fracture of the right sacral ala. 2. Comminuted right pubic body and superior pubic ramus fracture and mildly displaced and mildly comminuted fracture of the right inferior pubic ramus. 3. Areas of hemorrhage in the anterior aspect of the pelvis, right greater than left. Electronically Signed   By: Claudie Revering M.D.   On: 09/25/2019 12:40   DG HumerUS Right  Result Date: 09/25/2019 CLINICAL DATA:  Proximal right humerus pain following a fall yesterday. EXAM: RIGHT HUMERUS - 2+ VIEW COMPARISON:   01/27/2019 FINDINGS: Interval comminuted, impacted fracture of the right humeral neck and metaphysis. There is posterior displacement and anterior angulation of the distal fragment. There is also flattening of the superior aspect of the humeral head. Diffuse osteopenia. IMPRESSION: Comminuted, impacted and displaced fracture of the right humeral neck and metaphysis, as described above. Electronically Signed   By: Claudie Revering M.D.   On: 09/25/2019 11:46   DG Hip Unilat With Pelvis 2-3 Views Right  Result Date: 09/25/2019 CLINICAL DATA:  Right hip pain following a fall yesterday. EXAM: DG HIP (WITH OR WITHOUT PELVIS) 2-3V RIGHT COMPARISON:  01/25/2019 FINDINGS: Interval comminuted fractures involving the right pubic body and medial aspects of the superior and inferior pubic rami. Normal appearing right hip  without fracture or dislocation. Bilateral pelvic surgical clips. Diffuse osteopenia. IMPRESSION: Comminuted right pubic bone fractures, as described above. Electronically Signed   By: Claudie Revering M.D.   On: 09/25/2019 11:44    EKG: Independently reviewed.  Vent. rate 68 BPM PR interval * ms QRS duration 97 ms QT/QTc 435/463 ms P-R-T axes 54 56 81 Sinus rhythm Multiform ventricular premature complexes Probable left ventricular hypertrophy T wave abnormality Artifact Abnormal ECG  Assessment/Plan Principal Problem:   Multiple pelvic fractures (HCC)   Closed right humeral fracture No need for surgical intervention per Dr. Wynelle Link. Observation/telemetry. Analgesics as needed. Will need physical therapy. Ortho recommends follow-up in 2 to 3 weeks.  Active Problems:   Acute lower UTI Patient stated that she had been urinating often. Check urine culture and sensitivity. Start ceftriaxone 1 g IVPB daily. Follow-up WBC, UC&S.    Hypothyroidism Continue levothyroxine.    Essential hypertension Continue metoprolol 50 mg p.o. daily.   Monitor BP and heart rate.    PAF (paroxysmal  atrial fibrillation) (HCC) CHA?DS?-VASc at least 5. Not on anticoagulation due to fall risk. Continue metoprolol 50 mg p.o. daily.    Dementia (HCC) Continue Aricept 5 mg p.o. daily. Supportive care.   DVT prophylaxis: SCDs. Code Status: Full code Family Communication: I spoke to her son Alianny Silversmith and updated him about her status Disposition Plan: Observation for pain control and orthopedic surgery evaluation. Consults called: Orthopedic surgery will evaluate later this afternoon. Admission status: Observation/telemetry.   Reubin Milan MD [facility]  If 7PM-7AM, please contact night-coverage [email or web address]  09/25/2019, 4:01 PM   This document was prepared using Dragon voice recognition software and may contain some unintended transcription errors.

## 2019-09-25 NOTE — ED Provider Notes (Signed)
Pain antibiotics.   [facility] DEPT Provider Note   CSN: [id number] Arrival date & time: 09/25/19  [id number]     History Chief Complaint  Patient presents with  . Fall  . Hip Injury    Dominique [name] is a 85 y.o. female.  HPI     Patient presents after mechanical fall. She arrives from a nursing facility, where the fall was unwitnessed, but the patient seems to recall going to the bathroom, slipping, falling and striking her right side.  She denies loss of consciousness, has had no head or neck pain, confusion, new weakness.  On however, she does have severe pain in her right arm, right leg, worse with attempts at ambulation, motion or with palpation. EMS reports the patient was hypertensive in route, possibly had hypoxia after receiving 100 mcg of fentanyl. She was reportedly ANO x4 in route, and at the facility, which is her baseline. Staff the facility also concern for the patient's skin color, noting that she was more pale than usual today. She does have a history of anemia. Patient states that she was in her usual state of health prior to falling.  Since that time, as above has had severe pain, in both the arm, leg, worse with any attempted motion.  Past Medical History:  Diagnosis Date  . Anemia   . Cancer ([place])    uterine  . Hypertension   . Hyponatremia   . Hypothyroidism   . LVH (left ventricular hypertrophy)   . PAF (paroxysmal atrial fibrillation) ([place])    a. initial dx 2012 in setting of acute illness. b. began to recur 09/2017; started on amiodarone 11/2017.  Marland Kitchen Shingles   . Thrombocytopenia [facility])     Patient Active Problem List   Diagnosis Date Noted  . Multiple pelvic fractures ([place]) 09/25/2019  . E. coli UTI 01/27/2019  . Hypokalemia 01/27/2019  . Weakness of right upper extremity 01/27/2019  . Weakness of right lower extremity 01/27/2019  . Sacral wound   . Acute encephalopathy 01/26/2019  . Falls 01/26/2019  . Pressure  injury of skin 01/26/2019  . Abnormal CT of the chest 07/14/2018  . 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 PAF (paroxysmal atrial fibrillation) ([place]) 11/26/2017  . Atrial fibrillation with RVR ([place])   . Essential hypertension   . History of uterine cancer 09/02/2016  . Benign essential HTN 09/02/2016  . Hypothyroidism 09/02/2016  . History of shingles 09/02/2016  . History of iron deficiency anemia 09/02/2016  . History of thrombocytopenia 09/02/2016    Past Surgical History:  Procedure Laterality Date  . ABDOMINAL HYSTERECTOMY    . JOINT REPLACEMENT Bilateral    knees     OB History   No obstetric history on file.     Family History  Problem Relation Age of Onset  . Tuberculosis Father   . Alcoholism Brother   . Hypertension Son     Social History   Tobacco Use  . Smoking status: Never Smoker  . Smokeless tobacco: Never Used  Substance Use Topics  . Alcohol use: No  . Drug use: No    Home Medications Prior to Admission medications   Medication Sig Start Date End Date Taking? Authorizing Provider  acetaminophen (TYLENOL) 500 MG tablet Take 500 mg by mouth every 6 (six) hours as needed for mild pain or headache.    Yes [provider]  Amino Acids-Protein Hydrolys (FEEDING SUPPLEMENT, PRO-STAT SUGAR FREE 64,) LIQD Take 30 mLs by mouth  2 (two) times daily.   Yes [provider]  aspirin 325 MG tablet Take 1 tablet (325 mg total) by mouth daily. 01/31/19  Yes Eugenie Filler, MD  clotrimazole (MYCELEX) 10 MG troche Take 10 mg by mouth 5 (five) times daily as needed (thrush).    Yes [provider]  donepezil (ARICEPT) 5 MG tablet Take 5 mg by mouth daily. 01/25/19  Yes [provider]  levothyroxine (SYNTHROID, LEVOTHROID) 50 MCG tablet Take 50 mcg by mouth daily before breakfast.   Yes [provider]  metoprolol tartrate (LOPRESSOR) 50 MG tablet Take 50 mg by mouth daily.   Yes [provider]  Multiple  Vitamin (MULTIVITAMIN WITH MINERALS) TABS tablet Take 1 tablet by mouth daily. 01/31/19  Yes Eugenie Filler, MD  simvastatin (ZOCOR) 20 MG tablet Take 20 mg by mouth daily.   Yes [provider]  enoxaparin (LOVENOX) 30 MG/0.3ML injection Inject 0.3 mLs (30 mg total) into the skin daily. Patient not taking: Reported on 09/25/2019 01/30/19   Eugenie Filler, MD  feeding supplement, ENSURE ENLIVE, (ENSURE ENLIVE) LIQD Take 237 mLs by mouth 2 (two) times daily between meals. Patient not taking: Reported on 09/25/2019 01/30/19   Eugenie Filler, MD    Allergies    Codeine, Darvocet [propoxyphene n-acetaminophen], Demerol, Percocet [oxycodone-acetaminophen], and Prednisone  Review of Systems   Review of Systems  Constitutional:       Per HPI, otherwise negative  HENT:       Per HPI, otherwise negative  Respiratory:       Per HPI, otherwise negative  Cardiovascular:       Per HPI, otherwise negative  Gastrointestinal: Negative for vomiting.  Endocrine:       Negative aside from HPI  Genitourinary:       Neg aside from HPI   Musculoskeletal:       Per HPI, otherwise negative  Skin: Negative.   Neurological: Positive for weakness. Negative for syncope.    Physical Exam Updated Vital Signs BP (!) 204/83   Pulse 74   Temp (!) 97.5 F (36.4 C) (Oral)   Resp 16   SpO2 95%   Physical Exam Vitals and nursing note reviewed.  Constitutional:      General: She is not in acute distress.    Appearance: She is well-developed.  HENT:     Head: Normocephalic and atraumatic.  Eyes:     Conjunctiva/sclera: Conjunctivae normal.  Neck:   Cardiovascular:     Rate and Rhythm: Normal rate and regular rhythm.  Pulmonary:     Effort: Pulmonary effort is normal. No respiratory distress.     Breath sounds: Normal breath sounds. No stridor.  Abdominal:     General: There is no distension.  Musculoskeletal:       Arms:       Legs:  Skin:    General: Skin is warm and dry.      Coloration: Skin is pale.  Neurological:     General: No focal deficit present.     Mental Status: She is alert and oriented to person, place, and time.     Cranial Nerves: No cranial nerve deficit.     Motor: Atrophy present.     Comments: No focal deficit, though the patient was unwilling to move the right arm, right leg secondary to pain, she does have preserved sensation and distal function.     ED Results / Procedures / Treatments   Labs (  all labs ordered are listed, but only abnormal results are displayed) Labs Reviewed  CBC WITH DIFFERENTIAL/PLATELET - Abnormal; Notable for the following components:      Result Value   WBC 24.4 (*)    RBC 3.38 (*)    Hemoglobin 10.5 (*)    HCT 32.8 (*)    Neutro Abs 21.6 (*)    Abs Immature Granulocytes 0.18 (*)    All other components within normal limits  COMPREHENSIVE METABOLIC PANEL - Abnormal; Notable for the following components:   Sodium 133 (*)    Glucose, Bld 169 (*)    Albumin 3.4 (*)    All other components within normal limits  RESPIRATORY PANEL BY RT PCR (FLU A&B, COVID)  PROTIME-INR  URINALYSIS, ROUTINE W REFLEX MICROSCOPIC  TYPE AND SCREEN    EKG EKG Interpretation  Date/Time:  Monday September 25 2019 09:55:27 EST Ventricular Rate:  68 PR Interval:    QRS Duration: 97 QT Interval:  435 QTC Calculation: 463 R Axis:   56 Text Interpretation: Sinus rhythm Multiform ventricular premature complexes Probable left ventricular hypertrophy T wave abnormality Artifact Abnormal ECG Confirmed by Carmin Muskrat [phone]) on 09/25/2019 10:32:41 AM   Radiology CT PELVIS WO CONTRAST  Result Date: 09/25/2019 CLINICAL DATA:  Right hip pain following a fall. EXAM: CT PELVIS WITHOUT CONTRAST TECHNIQUE: Multidetector CT imaging of the pelvis was performed following the standard protocol without intravenous contrast. COMPARISON:  None. FINDINGS: Urinary Tract: There is some displacement of the bladder to the left by hemorrhage  in the right pelvis. Bowel:  Multiple sigmoid colon diverticula. Vascular/Lymphatic: Atheromatous arterial calcifications without aneurysm. No enlarged lymph nodes. Reproductive:  No mass or other significant abnormality Other: Areas of hemorrhage in the anterior aspect of the pelvis, right greater than left. Musculoskeletal: Mildly comminuted longitudinal fracture of the right sacral ala. Comminuted right pubic body and superior pubic ramus fracture and mildly displaced and mildly comminuted fracture of the right inferior pubic ramus. Lumbar and lower thoracic spine degenerative changes. These include facet degenerative changes with associated grade 1 anterolisthesis at the L4-5 level. IMPRESSION: 1. Mildly comminuted longitudinal fracture of the right sacral ala. 2. Comminuted right pubic body and superior pubic ramus fracture and mildly displaced and mildly comminuted fracture of the right inferior pubic ramus. 3. Areas of hemorrhage in the anterior aspect of the pelvis, right greater than left. Electronically Signed   By: Claudie Revering M.D.   On: 09/25/2019 12:40   DG HumerUS Right  Result Date: 09/25/2019 CLINICAL DATA:  Proximal right humerus pain following a fall yesterday. EXAM: RIGHT HUMERUS - 2+ VIEW COMPARISON:  01/27/2019 FINDINGS: Interval comminuted, impacted fracture of the right humeral neck and metaphysis. There is posterior displacement and anterior angulation of the distal fragment. There is also flattening of the superior aspect of the humeral head. Diffuse osteopenia. IMPRESSION: Comminuted, impacted and displaced fracture of the right humeral neck and metaphysis, as described above. Electronically Signed   By: Claudie Revering M.D.   On: 09/25/2019 11:46   DG Hip Unilat With Pelvis 2-3 Views Right  Result Date: 09/25/2019 CLINICAL DATA:  Right hip pain following a fall yesterday. EXAM: DG HIP (WITH OR WITHOUT PELVIS) 2-3V RIGHT COMPARISON:  01/25/2019 FINDINGS: Interval comminuted  fractures involving the right pubic body and medial aspects of the superior and inferior pubic rami. Normal appearing right hip without fracture or dislocation. Bilateral pelvic surgical clips. Diffuse osteopenia. IMPRESSION: Comminuted right pubic bone fractures, as described above. Electronically Signed  By: Claudie Revering M.D.   On: 09/25/2019 11:44    Procedures Procedures (including critical care time)  Medications Ordered in ED Medications  fentaNYL (SUBLIMAZE) injection 25 mcg (25 mcg Intravenous Given 09/25/19 1324)    ED Course  I have reviewed the triage vital signs and the nursing notes.  Pertinent labs & imaging results that were available during my care of the patient were reviewed by me and considered in my medical decision making (see chart for details).    MDM Rules/Calculators/A&P     CHA2DS2/VAS Stroke Risk Points  Current as of 11 days ago     5 >= 2 Points: High Risk  1 - 1.99 Points: Medium Risk  0 Points: Low Risk    This is the only CHA2DS2/VAS Stroke Risk Points available for the past  year.: Last Change: N/A                           12:03 PM  Patient aware of initial findings, including x-ray demonstrating proximal humerus fracture, multiple pelvis fractures. CT scan pelvis pending. Patient's color has improved since arrival, she remains hemodynamically unremarkable. Labs notable for leukocytosis.  Initial x-ray notable for proximal humerus fracture, comminuted, impacted, displaced. Also concern for multiple pelvic fractures.  On repeat exam patient is much more calm, color has improved.  1:29 PM Patient is CT scan performed after x-ray demonstrated pelvic fracture was demonstrative of additional pelvic fractures. I discussed the patient's case with our orthopedic colleagues, they will follow as a consulting team. Covid test pending. This elderly female with noted dementia, but ANO x3 presents after mechanical fall.  Patient is found to multiple  fractures, including proximal humerus, and pelvis. Given the patient's nonambulatory status following the falls, concern for multiple fractures, the patient requires admission for further monitoring, management. Patient is hemodynamically unremarkable, but does have multiple lab abnormalities, which may be secondary to stress from the event versus occult infection, but she is afebrile, without infectious complaints initially. Covid test pending.  Final Clinical Impression(s) / ED Diagnoses Final diagnoses:  Fall, initial encounter  Multiple closed fractures of pelvis without disruption of pelvic ring, initial encounter ([place])  Other closed displaced fracture of proximal end of right humerus, initial encounter      Carmin Muskrat, MD 09/25/19 1332

## 2019-09-25 NOTE — Consult Note (Signed)
Reason for Consult:Pubic ramus fractures and right humerus fracture Referring Physician: Dr. Vivianne Master Mcgirr is an 85 y.o. female.  HPI: Ms Tedesco is a resident of [facility] and had a mechanical fall yesterday with right hip and right shoulder pain. She does not recall all of the specifics related to the fall but it occurred while in the bathroom. She presented to the [facility] ED today with the hip and right shoulder pain and evaluation found a right proximal humerus fracture and right sided non-displaced pelvic fractures. Her only complaints are hip and shoulder pain. She is not complaining of any extremity paresthesias. We were consulted for management  Past Medical History:  Diagnosis Date  . Anemia   . Cancer ([place])    uterine  . Hypertension   . Hyponatremia   . Hypothyroidism   . LVH (left ventricular hypertrophy)   . PAF (paroxysmal atrial fibrillation) ([place])    a. initial dx 2012 in setting of acute illness. b. began to recur 09/2017; started on amiodarone 11/2017.  Marland Kitchen Shingles   . Thrombocytopenia ([place])     Past Surgical History:  Procedure Laterality Date  . ABDOMINAL HYSTERECTOMY    . JOINT REPLACEMENT Bilateral    knees    Family History  Problem Relation Age of Onset  . Tuberculosis Father   . Alcoholism Brother   . Hypertension Son     Social History:  reports that she has never smoked. She has never used smokeless tobacco. She reports that she does not drink alcohol or use drugs.  Allergies:  Allergies  Allergen Reactions  . Codeine Other (See Comments)    Unknown per son  . Darvocet [Propoxyphene N-Acetaminophen] Other (See Comments)    Unknown per son  . Demerol Other (See Comments)    Unknown per son  . Percocet [Oxycodone-Acetaminophen] Other (See Comments)    Unknown per son  . Prednisone     Uncertain this is true. Was documented when patient was admitted 02/12/2012, but not recalled by either the patient nor her son. Tolerating it easily 08/2016.      Medications: I have reviewed the patient's current medications.  Results for orders placed or performed during the hospital encounter of 09/25/19 (from the past 48 hour(s))  CBC WITH DIFFERENTIAL     Status: Abnormal   Collection Time: 09/25/19 10:38 AM  Result Value Ref Range   WBC 24.4 (H) 4.0 - 10.5 K/uL   RBC 3.38 (L) 3.87 - 5.11 MIL/uL   Hemoglobin 10.5 (L) 12.0 - 15.0 g/dL   HCT 32.8 (L) 36.0 - 46.0 %   MCV 97.0 80.0 - 100.0 fL   MCH 31.1 26.0 - 34.0 pg   MCHC 32.0 30.0 - 36.0 g/dL   RDW 13.5 11.5 - 15.5 %   Platelets 315 150 - 400 K/uL   nRBC 0.0 0.0 - 0.2 %   Neutrophils Relative % 89 %   Neutro Abs 21.6 (H) 1.7 - 7.7 K/uL   Lymphocytes Relative 6 %   Lymphs Abs 1.6 0.7 - 4.0 K/uL   Monocytes Relative 4 %   Monocytes Absolute 0.9 0.1 - 1.0 K/uL   Eosinophils Relative 0 %   Eosinophils Absolute 0.0 0.0 - 0.5 K/uL   Basophils Relative 0 %   Basophils Absolute 0.1 0.0 - 0.1 K/uL   Immature Granulocytes 1 %   Abs Immature Granulocytes 0.18 (H) 0.00 - 0.07 K/uL    Comment: Performed at [facility], [place]  [address] [place], [place] [address]  Protime-INR     Status: None   Collection Time: 09/25/19 10:38 AM  Result Value Ref Range   Prothrombin Time 14.7 11.4 - 15.2 seconds   INR 1.2 0.8 - 1.2    Comment: (NOTE) INR goal varies based on device and disease states. Performed at [facility] [place] [address], [place], [place] [address]   Type and screen [place]     Status: None   Collection Time: 09/25/19 10:38 AM  Result Value Ref Range   ABO/RH(D) A POS    Antibody Screen NEG    Sample Expiration      09/28/2019,2359 Performed at [facility], [place] [address] [place], [place] [address]   Comprehensive metabolic panel     Status: Abnormal   Collection Time: 09/25/19 10:38 AM  Result Value Ref Range   Sodium 133 (L) 135 - 145 mmol/L   Potassium 4.0 3.5 - 5.1 mmol/L   Chloride 99  98 - 111 mmol/L   CO2 22 22 - 32 mmol/L   Glucose, Bld 169 (H) 70 - 99 mg/dL   BUN 20 8 - 23 mg/dL   Creatinine, Ser 0.59 0.44 - 1.00 mg/dL   Calcium 8.9 8.9 - 10.3 mg/dL   Total Protein 6.7 6.5 - 8.1 g/dL   Albumin 3.4 (L) 3.5 - 5.0 g/dL   AST 30 15 - 41 U/L   ALT 21 0 - 44 U/L   Alkaline Phosphatase 60 38 - 126 U/L   Total Bilirubin 0.6 0.3 - 1.2 mg/dL   GFR calc non Af Amer >60 >60 mL/min   GFR calc Af Amer >60 >60 mL/min   Anion gap 12 5 - 15    Comment: Performed at [facility], [place] [address] [place], [place] [address]  Urinalysis, Routine w reflex microscopic     Status: Abnormal   Collection Time: 09/25/19 10:38 AM  Result Value Ref Range   Color, Urine AMBER (A) YELLOW    Comment: BIOCHEMICALS MAY BE AFFECTED BY COLOR   APPearance HAZY (A) CLEAR   Specific Gravity, Urine 1.017 1.005 - 1.030   pH 6.0 5.0 - 8.0   Glucose, UA NEGATIVE NEGATIVE mg/dL   Hgb urine dipstick MODERATE (A) NEGATIVE   Bilirubin Urine NEGATIVE NEGATIVE   Ketones, ur NEGATIVE NEGATIVE mg/dL   Protein, ur 30 (A) NEGATIVE mg/dL   Nitrite POSITIVE (A) NEGATIVE   Leukocytes,Ua SMALL (A) NEGATIVE   RBC / HPF >50 (H) 0 - 5 RBC/hpf   WBC, UA 11-20 0 - 5 WBC/hpf   Bacteria, UA MANY (A) NONE SEEN    Comment: Performed at [facility], [place] [address] [place], [place] [address]  Respiratory Panel by RT PCR (Flu A&B, Covid) - Urine, Clean Catch     Status: None   Collection Time: 09/25/19  1:18 PM   Specimen: Urine, Clean Catch  Result Value Ref Range   SARS Coronavirus 2 by RT PCR NEGATIVE NEGATIVE    Comment: (NOTE) SARS-CoV-2 target nucleic acids are NOT DETECTED. The SARS-CoV-2 RNA is generally detectable in upper respiratoy specimens during the acute phase of infection. The lowest concentration of SARS-CoV-2 viral copies this assay can detect is 131 copies/mL. A negative result does not preclude SARS-Cov-2 infection and should not be used as the sole basis  for treatment or other patient management decisions. A negative result may occur with  improper specimen collection/handling, submission  of specimen other than nasopharyngeal swab, presence of viral mutation(s) within the areas targeted by this assay, and inadequate number of viral copies (<131 copies/mL). A negative result must be combined with clinical observations, patient history, and epidemiological information. The expected result is Negative. Fact Sheet for Patients:  PinkCheek.be Fact Sheet for Healthcare Providers:  GravelBags.it This test is not yet ap proved or cleared by the [place] FDA and  has been authorized for detection and/or diagnosis of SARS-CoV-2 by FDA under an Emergency Use Authorization (EUA). This EUA will remain  in effect (meaning this test can be used) for the duration of the COVID-19 declaration under Section 564(b)(1) of the Act, 21 U.S.C. section 360bbb-3(b)(1), unless the authorization is terminated or revoked sooner.    Influenza A by PCR NEGATIVE NEGATIVE   Influenza B by PCR NEGATIVE NEGATIVE    Comment: (NOTE) The Xpert Xpress SARS-CoV-2/FLU/RSV assay is intended as an aid in  the diagnosis of influenza from Nasopharyngeal swab specimens and  should not be used as a sole basis for treatment. Nasal washings and  aspirates are unacceptable for Xpert Xpress SARS-CoV-2/FLU/RSV  testing. Fact Sheet for Patients: PinkCheek.be Fact Sheet for Healthcare Providers: GravelBags.it This test is not yet approved or cleared by the [place] FDA and  has been authorized for detection and/or diagnosis of SARS-CoV-2 by  FDA under an Emergency Use Authorization (EUA). This EUA will remain  in effect (meaning this test can be used) for the duration of the  Covid-19 declaration under Section 564(b)(1) of the Act, 21  U.S.C. section  360bbb-3(b)(1), unless the authorization is  terminated or revoked. Performed at [facility], [place] [address], [place], [place] [address]     CT PELVIS WO CONTRAST  Result Date: 09/25/2019 CLINICAL DATA:  Right hip pain following a fall. EXAM: CT PELVIS WITHOUT CONTRAST TECHNIQUE: Multidetector CT imaging of the pelvis was performed following the standard protocol without intravenous contrast. COMPARISON:  None. FINDINGS: Urinary Tract: There is some displacement of the bladder to the left by hemorrhage in the right pelvis. Bowel:  Multiple sigmoid colon diverticula. Vascular/Lymphatic: Atheromatous arterial calcifications without aneurysm. No enlarged lymph nodes. Reproductive:  No mass or other significant abnormality Other: Areas of hemorrhage in the anterior aspect of the pelvis, right greater than left. Musculoskeletal: Mildly comminuted longitudinal fracture of the right sacral ala. Comminuted right pubic body and superior pubic ramus fracture and mildly displaced and mildly comminuted fracture of the right inferior pubic ramus. Lumbar and lower thoracic spine degenerative changes. These include facet degenerative changes with associated grade 1 anterolisthesis at the L4-5 level. IMPRESSION: 1. Mildly comminuted longitudinal fracture of the right sacral ala. 2. Comminuted right pubic body and superior pubic ramus fracture and mildly displaced and mildly comminuted fracture of the right inferior pubic ramus. 3. Areas of hemorrhage in the anterior aspect of the pelvis, right greater than left. Electronically Signed   By: Claudie Revering M.D.   On: 09/25/2019 12:40   DG HumerUS Right  Result Date: 09/25/2019 CLINICAL DATA:  Proximal right humerus pain following a fall yesterday. EXAM: RIGHT HUMERUS - 2+ VIEW COMPARISON:  01/27/2019 FINDINGS: Interval comminuted, impacted fracture of the right humeral neck and metaphysis. There is posterior displacement and anterior angulation of the  distal fragment. There is also flattening of the superior aspect of the humeral head. Diffuse osteopenia. IMPRESSION: Comminuted, impacted and displaced fracture of the right humeral neck and metaphysis, as described above. Electronically Signed   By:  Claudie Revering M.D.   On: 09/25/2019 11:46   DG Hip Unilat With Pelvis 2-3 Views Right  Result Date: 09/25/2019 CLINICAL DATA:  Right hip pain following a fall yesterday. EXAM: DG HIP (WITH OR WITHOUT PELVIS) 2-3V RIGHT COMPARISON:  01/25/2019 FINDINGS: Interval comminuted fractures involving the right pubic body and medial aspects of the superior and inferior pubic rami. Normal appearing right hip without fracture or dislocation. Bilateral pelvic surgical clips. Diffuse osteopenia. IMPRESSION: Comminuted right pubic bone fractures, as described above. Electronically Signed   By: Claudie Revering M.D.   On: 09/25/2019 11:44    Review of Systems Blood pressure (!) 177/82, pulse 71, temperature (!) 97.5 F (36.4 C), temperature source Oral, resp. rate 18, SpO2 99 %. Physical Exam Physical Examination: General appearance - alert, well appearing, and in no distress Mental status - alert, oriented to person, pleasant and conversive Neurological - alert, oriented, normal speech, no focal findings or movement disorder noted, motor and sensory grossly normal bilaterally Extremities - no pedal edema noted, intact peripheral pulses Right upper extremity in sling- tender proximal upper arm, pain on shoulder motion pulses and sensation intact, motor intact distally Tender over right greater trochanter, pain on attempted right hip flexion but not rotation. Tender over pubis, no pain on left greater trochanter Pulses, sensation and motor intact RLE  X-rays/Pelvic CT- Non-displaced superior and inferior pubic rami fractures on the right; no evidence of femoral head or neck fracture Right proximal humerus fracture mildly displaced with significant pre-existing  glenohumeral arthtritis;no dislocation    Assessment/Plan: 1- Pelvic fractures- Fortunately these are stable injuries which can be treated non-operatively. Can begin PT for WBAT RLE. Will need platform extension for RUE attached to walker as she won-t be able to bear weight through her arm due to the humerus fracture. She can follow up in the office in 2-3 weeks for recheck 2- Proximal humerus fracture- This is also a non-operative injury and treatment will be sling immobilization. OK to do pendulum exercises Overall begin PT/OT and follow up in office in 2-3 weeks for recheck   Dominique [name] 09/25/2019, 4:46 PM

## 2019-09-25 NOTE — ED Notes (Signed)
Son: Mr. Stayner [phone]

## 2019-09-25 NOTE — ED Triage Notes (Signed)
Per EMS: Pt is coming from [facility]. Patient reportedly hit her right side. Patient has shortening and rotation to the right foot. Patient is reporting pain from the shoulder down.

## 2019-09-26 ENCOUNTER — Other Ambulatory Visit: Payer: Self-pay

## 2019-09-26 DIAGNOSIS — N390 Urinary tract infection, site not specified: Secondary | ICD-10-CM | POA: Diagnosis present

## 2019-09-26 DIAGNOSIS — S3219XA Other fracture of sacrum, initial encounter for closed fracture: Secondary | ICD-10-CM | POA: Diagnosis present

## 2019-09-26 DIAGNOSIS — Z8542 Personal history of malignant neoplasm of other parts of uterus: Secondary | ICD-10-CM | POA: Diagnosis not present

## 2019-09-26 DIAGNOSIS — Z888 Allergy status to other drugs, medicaments and biological substances status: Secondary | ICD-10-CM | POA: Diagnosis not present

## 2019-09-26 DIAGNOSIS — I10 Essential (primary) hypertension: Secondary | ICD-10-CM | POA: Diagnosis present

## 2019-09-26 DIAGNOSIS — E039 Hypothyroidism, unspecified: Secondary | ICD-10-CM | POA: Diagnosis present

## 2019-09-26 DIAGNOSIS — Z79890 Hormone replacement therapy (postmenopausal): Secondary | ICD-10-CM | POA: Diagnosis not present

## 2019-09-26 DIAGNOSIS — S42291A Other displaced fracture of upper end of right humerus, initial encounter for closed fracture: Secondary | ICD-10-CM | POA: Diagnosis present

## 2019-09-26 DIAGNOSIS — F0390 Unspecified dementia without behavioral disturbance: Secondary | ICD-10-CM | POA: Diagnosis present

## 2019-09-26 DIAGNOSIS — Z79899 Other long term (current) drug therapy: Secondary | ICD-10-CM | POA: Diagnosis not present

## 2019-09-26 DIAGNOSIS — S32810A Multiple fractures of pelvis with stable disruption of pelvic ring, initial encounter for closed fracture: Secondary | ICD-10-CM | POA: Diagnosis not present

## 2019-09-26 DIAGNOSIS — Z96653 Presence of artificial knee joint, bilateral: Secondary | ICD-10-CM | POA: Diagnosis present

## 2019-09-26 DIAGNOSIS — S3282XA Multiple fractures of pelvis without disruption of pelvic ring, initial encounter for closed fracture: Secondary | ICD-10-CM | POA: Diagnosis present

## 2019-09-26 DIAGNOSIS — W010XXA Fall on same level from slipping, tripping and stumbling without subsequent striking against object, initial encounter: Secondary | ICD-10-CM | POA: Diagnosis present

## 2019-09-26 DIAGNOSIS — Z20828 Contact with and (suspected) exposure to other viral communicable diseases: Secondary | ICD-10-CM | POA: Diagnosis present

## 2019-09-26 DIAGNOSIS — Z7982 Long term (current) use of aspirin: Secondary | ICD-10-CM | POA: Diagnosis not present

## 2019-09-26 DIAGNOSIS — Z90710 Acquired absence of both cervix and uterus: Secondary | ICD-10-CM | POA: Diagnosis not present

## 2019-09-26 DIAGNOSIS — I480 Paroxysmal atrial fibrillation: Secondary | ICD-10-CM | POA: Diagnosis present

## 2019-09-26 DIAGNOSIS — Z885 Allergy status to narcotic agent status: Secondary | ICD-10-CM | POA: Diagnosis not present

## 2019-09-26 DIAGNOSIS — Z8249 Family history of ischemic heart disease and other diseases of the circulatory system: Secondary | ICD-10-CM | POA: Diagnosis not present

## 2019-09-26 NOTE — ED Notes (Signed)
Pt assisted with bed pain due to stating she needed to have a bowel movement. Pt only urinated and no bowel movement present. Pt cleaned and purewick replaced.

## 2019-09-26 NOTE — Plan of Care (Signed)
Plan of care for HD #2 discussed with patient.   Will continue to monitor.     [place], Therapist, sports

## 2019-09-26 NOTE — ED Notes (Signed)
Pt was repositioned, new chuck pads placed, peri-care provided by RN & EMT.

## 2019-09-26 NOTE — ED Notes (Signed)
Vitals obtained

## 2019-09-26 NOTE — Progress Notes (Signed)
PROGRESS NOTE    Dominique [name]  [id number] DOB: 1932/11/23 DOA: 09/25/2019 PCP: [name], MD    Brief Narrative:  85 year old nursing home resident female with multiple medical problems including dementia admitted with multiple nonoperative fractures following a mechanical fall likely in setting of UTI   Assessment & Plan:   Principal Problem:   Multiple pelvic fractures ([place]) Active Problems:   Hypothyroidism   Essential hypertension   PAF (paroxysmal atrial fibrillation) (HCC)   Acute lower UTI   Dementia ([place])   Closed right humeral fracture  Multiple pelvic fractures ([place]) Closed right humeral fracture Appears to be a mechanical fall in setting of weakness secondary to UTI No need for surgical intervention per Dr. Wynelle Link with orthopedic Analgesics as needed. PT/OT consulted Ortho recommends follow-up in 2 to 3 weeks.  Acute lower UTI Patient stated that she had been urinating often and had fever.  Leukocytosis. Urine culture growing E. coli, pending sensitivity Continue ceftriaxone 1 g IVPB daily.    Hypothyroidism Continue levothyroxine.    Essential hypertension Continue metoprolol 50 mg p.o. daily.   Monitor BP and heart rate.    PAF (paroxysmal atrial fibrillation) (HCC) CHA?DS?-VASc at least 5. Not on anticoagulation due to fall risk. Continue metoprolol 50 mg p.o. daily.    Dementia (HCC) Continue Aricept 5 mg p.o. daily. Supportive care.   DVT prophylaxis: SCD/Compression stockings Code Status: Full code  Family Communication: Will contact son to discuss plan of care  Disposition Plan: Discharge back to SNF when stable.  Needs 1-2 more inpatient days pending urine culture susceptibilities to determine appropriate oral antibiotics for safe discharge.  Also needs PT/OT evaluation and case manager coordination for discharge.   Consultants:   Orthopedic  Procedures:  None  Antimicrobials:    Ceftriaxone   Subjective: Patient seen and examined at bedside.  Complains of pain all over.  Says she fell a few weeks ago and is hurting all over.  Asking if she will require surgical intervention for the fractures.  Discussed she has been seen by orthopedic who recommended nonsurgical management and outpatient follow-up with orthopedic.  Objective: Vitals:   09/26/19 1300 09/26/19 1430 09/26/19 1500 09/26/19 1731  BP: 127/67 128/63 128/63 (!) 180/74  Pulse: 63 63 62 70  Resp: 18 20 (!) 21 20  Temp:    98.3 F (36.8 C)  TempSrc:    Oral  SpO2: 97% 96% 94% 95%  Height:    4' 11.5" (1.511 m)    Intake/Output Summary (Last 24 hours) at 09/26/2019 1822 Last data filed at 09/26/2019 0441 Gross per 24 hour  Intake 100 ml  Output --  Net 100 ml   There were no vitals filed for this visit.  Examination:  General exam: Appears calm and comfortable  Respiratory system: Clear to auscultation. Respiratory effort normal. Cardiovascular system: S1 & S2 heard, RRR. No JVD. No pedal edema. Gastrointestinal system: Abdomen is nondistended, soft and nontender. Normal bowel sounds heard.  Suprapubic discomfort on palpation Central nervous system: Alert and oriented.  Moves all extremities voluntarily Extremities: Right knee bruising present Skin: No obvious rash Psychiatry:  Mood & affect appropriate.    Data Reviewed: I have personally reviewed following labs and imaging studies  CBC: Recent Labs  Lab 09/25/19 1038  WBC 24.4*  NEUTROABS 21.6*  HGB 10.5*  HCT 32.8*  MCV 97.0  PLT [id number]   Basic Metabolic Panel: Recent Labs  Lab 09/25/19 1038  NA 133*  K 4.0  CL  99  CO2 22  GLUCOSE 169*  BUN 20  CREATININE 0.59  CALCIUM 8.9   GFR: CrCl cannot be calculated (Unknown ideal weight.). Liver Function Tests: Recent Labs  Lab 09/25/19 1038  AST 30  ALT 21  ALKPHOS 60  BILITOT 0.6  PROT 6.7  ALBUMIN 3.4*   No results for input(s): LIPASE, AMYLASE in the last 168  hours. No results for input(s): AMMONIA in the last 168 hours. Coagulation Profile: Recent Labs  Lab 09/25/19 1038  INR 1.2   Cardiac Enzymes: No results for input(s): CKTOTAL, CKMB, CKMBINDEX, TROPONINI in the last 168 hours. BNP (last 3 results) No results for input(s): PROBNP in the last 8760 hours. HbA1C: No results for input(s): HGBA1C in the last 72 hours. CBG: No results for input(s): GLUCAP in the last 168 hours. Lipid Profile: No results for input(s): CHOL, HDL, LDLCALC, TRIG, CHOLHDL, LDLDIRECT in the last 72 hours. Thyroid Function Tests: No results for input(s): TSH, T4TOTAL, FREET4, T3FREE, THYROIDAB in the last 72 hours. Anemia Panel: No results for input(s): VITAMINB12, FOLATE, FERRITIN, TIBC, IRON, RETICCTPCT in the last 72 hours. Sepsis Labs: No results for input(s): PROCALCITON, LATICACIDVEN in the last 168 hours.  Recent Results (from the past 240 hour(s))  Culture, Urine     Status: Abnormal (Preliminary result)   Collection Time: 09/25/19 10:38 AM   Specimen: Urine, Catheterized  Result Value Ref Range Status   Specimen Description   Final    URINE, CATHETERIZED Performed at [facility], [place] [address], [place], [place] [address]    Special Requests   Final    NONE Performed at [facility], [place] [address], [place], [place] [address]    Culture (A)  Final    >=100,000 COLONIES/mL ESCHERICHIA COLI SUSCEPTIBILITIES TO FOLLOW Performed at [facility], [place] [address], [place],  [address]    Report Status PENDING  Incomplete  Respiratory Panel by RT PCR (Flu A&B, Covid) - Urine, Clean Catch     Status: None   Collection Time: 09/25/19  1:18 PM   Specimen: Urine, Clean Catch  Result Value Ref Range Status   SARS Coronavirus 2 by RT PCR NEGATIVE NEGATIVE Final    Comment: (NOTE) SARS-CoV-2 target nucleic acids are NOT DETECTED. The SARS-CoV-2 RNA is generally detectable in upper respiratoy specimens  during the acute phase of infection. The lowest concentration of SARS-CoV-2 viral copies this assay can detect is 131 copies/mL. A negative result does not preclude SARS-Cov-2 infection and should not be used as the sole basis for treatment or other patient management decisions. A negative result may occur with  improper specimen collection/handling, submission of specimen other than nasopharyngeal swab, presence of viral mutation(s) within the areas targeted by this assay, and inadequate number of viral copies (<131 copies/mL). A negative result must be combined with clinical observations, patient history, and epidemiological information. The expected result is Negative. Fact Sheet for Patients:  PinkCheek.be Fact Sheet for Healthcare Providers:  GravelBags.it This test is not yet ap proved or cleared by the [place] FDA and  has been authorized for detection and/or diagnosis of SARS-CoV-2 by FDA under an Emergency Use Authorization (EUA). This EUA will remain  in effect (meaning this test can be used) for the duration of the COVID-19 declaration under Section 564(b)(1) of the Act, 21 U.S.C. section 360bbb-3(b)(1), unless the authorization is terminated or revoked sooner.    Influenza A by PCR NEGATIVE NEGATIVE Final   Influenza B  by PCR NEGATIVE NEGATIVE Final    Comment: (NOTE) The Xpert Xpress SARS-CoV-2/FLU/RSV assay is intended as an aid in  the diagnosis of influenza from Nasopharyngeal swab specimens and  should not be used as a sole basis for treatment. Nasal washings and  aspirates are unacceptable for Xpert Xpress SARS-CoV-2/FLU/RSV  testing. Fact Sheet for Patients: PinkCheek.be Fact Sheet for Healthcare Providers: GravelBags.it This test is not yet approved or cleared by the [place] FDA and  has been authorized for detection and/or diagnosis of  SARS-CoV-2 by  FDA under an Emergency Use Authorization (EUA). This EUA will remain  in effect (meaning this test can be used) for the duration of the  Covid-19 declaration under Section 564(b)(1) of the Act, 21  U.S.C. section 360bbb-3(b)(1), unless the authorization is  terminated or revoked. Performed at [facility], [place] [address] [place], [place] [address]          Radiology Studies: CT PELVIS WO CONTRAST  Result Date: 09/25/2019 CLINICAL DATA:  Right hip pain following a fall. EXAM: CT PELVIS WITHOUT CONTRAST TECHNIQUE: Multidetector CT imaging of the pelvis was performed following the standard protocol without intravenous contrast. COMPARISON:  None. FINDINGS: Urinary Tract: There is some displacement of the bladder to the left by hemorrhage in the right pelvis. Bowel:  Multiple sigmoid colon diverticula. Vascular/Lymphatic: Atheromatous arterial calcifications without aneurysm. No enlarged lymph nodes. Reproductive:  No mass or other significant abnormality Other: Areas of hemorrhage in the anterior aspect of the pelvis, right greater than left. Musculoskeletal: Mildly comminuted longitudinal fracture of the right sacral ala. Comminuted right pubic body and superior pubic ramus fracture and mildly displaced and mildly comminuted fracture of the right inferior pubic ramus. Lumbar and lower thoracic spine degenerative changes. These include facet degenerative changes with associated grade 1 anterolisthesis at the L4-5 level. IMPRESSION: 1. Mildly comminuted longitudinal fracture of the right sacral ala. 2. Comminuted right pubic body and superior pubic ramus fracture and mildly displaced and mildly comminuted fracture of the right inferior pubic ramus. 3. Areas of hemorrhage in the anterior aspect of the pelvis, right greater than left. Electronically Signed   By: Claudie Revering M.D.   On: 09/25/2019 12:40   DG HumerUS Right  Result Date: 09/25/2019 CLINICAL DATA:   Proximal right humerus pain following a fall yesterday. EXAM: RIGHT HUMERUS - 2+ VIEW COMPARISON:  01/27/2019 FINDINGS: Interval comminuted, impacted fracture of the right humeral neck and metaphysis. There is posterior displacement and anterior angulation of the distal fragment. There is also flattening of the superior aspect of the humeral head. Diffuse osteopenia. IMPRESSION: Comminuted, impacted and displaced fracture of the right humeral neck and metaphysis, as described above. Electronically Signed   By: Claudie Revering M.D.   On: 09/25/2019 11:46   DG Hip Unilat With Pelvis 2-3 Views Right  Result Date: 09/25/2019 CLINICAL DATA:  Right hip pain following a fall yesterday. EXAM: DG HIP (WITH OR WITHOUT PELVIS) 2-3V RIGHT COMPARISON:  01/25/2019 FINDINGS: Interval comminuted fractures involving the right pubic body and medial aspects of the superior and inferior pubic rami. Normal appearing right hip without fracture or dislocation. Bilateral pelvic surgical clips. Diffuse osteopenia. IMPRESSION: Comminuted right pubic bone fractures, as described above. Electronically Signed   By: Claudie Revering M.D.   On: 09/25/2019 11:44        Scheduled Meds: . aspirin  325 mg Oral Daily  . donepezil  5 mg Oral Daily  . feeding supplement (PRO-STAT SUGAR FREE 64)  30 mL Oral BID  . levothyroxine  50 mcg Oral Q0600  . metoprolol tartrate  50 mg Oral Daily  . simvastatin  20 mg Oral Daily   Continuous Infusions: . cefTRIAXone (ROCEPHIN)  IV 1 g (09/26/19 1642)     LOS: 0 days    Time spent: Spent more than 30 minutes in coordinating care for this patient including bedside patient care.   Lucky Cowboy, MD [facility] If 7PM-7AM, please contact night-coverage 09/26/2019, 6:22 PM

## 2019-09-27 DIAGNOSIS — N390 Urinary tract infection, site not specified: Secondary | ICD-10-CM

## 2019-09-27 LAB — CBC
HCT: 21.8 % — ABNORMAL LOW (ref 36.0–46.0)
Hemoglobin: 7.1 g/dL — ABNORMAL LOW (ref 12.0–15.0)
MCH: 31.4 pg (ref 26.0–34.0)
MCHC: 32.6 g/dL (ref 30.0–36.0)
MCV: 96.5 fL (ref 80.0–100.0)
Platelets: 194 10*3/uL (ref 150–400)
RBC: 2.26 MIL/uL — ABNORMAL LOW (ref 3.87–5.11)
RDW: 14.3 % (ref 11.5–15.5)
WBC: 13.4 10*3/uL — ABNORMAL HIGH (ref 4.0–10.5)
nRBC: 0 % (ref 0.0–0.2)

## 2019-09-27 LAB — URINE CULTURE: Culture: >=100000 — AB

## 2019-09-27 MED ORDER — NITROFURANTOIN MONOHYD MACRO 100 MG PO CAPS: 100.0000 mg | ORAL_CAPSULE | Freq: Two times a day (BID) | ORAL | 0 refills | Status: AC

## 2019-09-27 NOTE — Progress Notes (Signed)
OT Cancellation Note  Patient Details Name: Dominique [name] MRN: [id number] DOB: Feb 19, 1933   Cancelled Treatment:    Reason Eval/Treat Not Completed: Noted plan to go back to SNF this day . Will defer OT to SNF Kari Baars, [place] [phone] Office- [phone], Thereasa Parkin 09/27/2019, 12:46 PM

## 2019-09-27 NOTE — Evaluation (Signed)
Physical Therapy Evaluation Patient Details Name: Dominique [name] MRN: [id number] DOB: Jan 15, 1933 Today's Date: 09/27/2019   History of Present Illness  85 year old female with PMHx significant for dementia, HTN, PAF and admitted with multiple nonoperative fractures (multiple pelvic fractures and right proximal humerus fracture) following a mechanical fall likely in setting of UTI  Clinical Impression  Pt admitted with above diagnosis.  Pt currently with functional limitations due to the deficits listed below (see PT Problem List). Pt will benefit from skilled PT to increase their independence and safety with mobility to allow discharge to the venue listed below.  Pt assisted with sitting EOB.  Pt requiring assist due to NWB R UE (in sling) and pain along right side of body.  Pt also with significant ecchymosis to right knee and hx of bil TKAs (RN notified).  Pt from [facility] and would benefit from SNF rehab upon d/c.     Follow Up Recommendations SNF    Equipment Recommendations  None recommended by PT    Recommendations for Other Services       Precautions / Restrictions Precautions Precautions: Fall Required Braces or Orthoses: Sling Restrictions Weight Bearing Restrictions: Yes RUE Weight Bearing: Non weight bearing Other Position/Activity Restrictions: R LE WBAT      Mobility  Bed Mobility Overal bed mobility: Needs Assistance Bed Mobility: Supine to Sit;Sit to Supine     Supine to sit: Mod assist;+2 for safety/equipment Sit to supine: Max assist;+2 for safety/equipment   General bed mobility comments: assist for R LE over EOB and trunk upright, assist for LEs onto bed and control of trunk descent; R UE maintained in sling; pt reports pain along right side with movement however eases with rest  Transfers                 General transfer comment: pt declined today, she felt not able to tolerate (pain and weakness)  Ambulation/Gait                 Stairs            Wheelchair Mobility    Modified Rankin (Stroke Patients Only)       Balance Overall balance assessment: History of Falls                                           Pertinent Vitals/Pain Pain Assessment: Faces Faces Pain Scale: Hurts little more Pain Location: "all right side" Pain Descriptors / Indicators: Sore;Aching;Grimacing;Guarding Pain Intervention(s): Premedicated before session;Repositioned;Ice applied(ice to ecchymosis right knee)    Home Living Family/patient expects to be discharged to:: Skilled nursing facility                      Prior Function Level of Independence: Needs assistance   Gait / Transfers Assistance Needed: admitted after fall in the bathroom at [place]        Extremity/Trunk Assessment        Lower Extremity Assessment Lower Extremity Assessment: Generalized weakness;RLE deficits/detail RLE Deficits / Details: assist required due to pain, ecchymosis right knee (RN notified) RLE: Unable to fully assess due to pain       Communication   Communication: No difficulties  Cognition Arousal/Alertness: Awake/alert Behavior During Therapy: WFL for tasks assessed/performed Overall Cognitive Status: History of cognitive impairments -  at baseline                                 General Comments: hx dementia, pleasant and following commands      General Comments      Exercises     Assessment/Plan    PT Assessment Patient needs continued PT services  PT Problem List Decreased strength;Decreased mobility;Decreased balance;Decreased knowledge of use of DME;Decreased knowledge of precautions;Pain       PT Treatment Interventions DME instruction;Therapeutic activities;Gait training;Therapeutic exercise;Patient/family education;Functional mobility training;Balance training    PT Goals (Current goals can be found in the Care Plan section)   Acute Rehab PT Goals PT Goal Formulation: Patient unable to participate in goal setting Time For Goal Achievement: 10/11/19 Potential to Achieve Goals: Good    Frequency Min 2X/week   Barriers to discharge        Co-evaluation               AM-PAC PT "6 Clicks" Mobility  Outcome Measure Help needed turning from your back to your side while in a flat bed without using bedrails?: A Lot Help needed moving from lying on your back to sitting on the side of a flat bed without using bedrails?: A Lot Help needed moving to and from a bed to a chair (including a wheelchair)?: A Lot Help needed standing up from a chair using your arms (e.g., wheelchair or bedside chair)?: A Lot Help needed to walk in hospital room?: Total Help needed climbing 3-5 steps with a railing? : Total 6 Click Score: 10    End of Session   Activity Tolerance: Patient tolerated treatment well Patient left: in bed;with bed alarm set;with call bell/phone within reach;with nursing/sitter in room Nurse Communication: Mobility status PT Visit Diagnosis: Other abnormalities of gait and mobility (R26.89)    Time: [id number] PT Time Calculation (min) (ACUTE ONLY): 14 min   Charges:   PT Evaluation $PT Eval Low Complexity: 1 Low         Kati PT, DPT Acute Rehabilitation Services Office: [phone]  York Ram E 09/27/2019, 12:01 PM

## 2019-09-27 NOTE — TOC Transition Note (Signed)
Transition of Care [facility]) - CM/SW Discharge Note   Patient Details  Name: Dominique [name] MRN: [id number] Date of Birth: [birth year]  [facility] CM/SW Contact:  Dessa Phi, RN Phone Number: 09/27/2019, 1:51 PM   Clinical Narrative: d/c back to [facility] rep Janie aware going to rm [id number] nurse call report #[phone]. PTAR for transport back. Nsg aware.      Final next level of care: [place] Barriers to Discharge: No Barriers Identified   Patient Goals and CMS Choice        Discharge Placement   Existing PASRR number confirmed : 09/27/19          Patient chooses bed at: [facility] Patient to be transferred to facility by: [place] Name of family member notified: Chrissie Noa son [phone] Patient and family notified of of transfer: 09/27/19  Discharge Plan and Services                                     Social Determinants of Health (SDOH) Interventions     Readmission Risk Interventions No flowsheet data found.

## 2019-09-27 NOTE — TOC Transition Note (Signed)
Transition of Care [facility]) - CM/SW Discharge Note   Patient Details  Name: Dominique [name] MRN: [id number] Date of Birth: May 17, 1933  Transition of Care [facility]) CM/SW Contact:  Dessa Phi, RN Phone Number: 09/27/2019, 12:25 PM   Clinical Narrative: Tawni Carnes to [facility] rep janie aware. D/c back to [facility] today rm [id number] tel# for nurse to call report [phone]. PTAR  To be called once d/c summary sent.    Final next level of care: [place] Barriers to Discharge: No Barriers Identified   Patient Goals and CMS Choice        Discharge Placement   Existing PASRR number confirmed : 09/27/19          Patient [facility] bed at: [facility] Patient to be transferred to facility by: [place] Name of family member notified: Kazmira Harmening son [phone] Patient and family notified of of transfer: 09/27/19  Discharge Plan and Services                                     Social Determinants of Health (SDOH) Interventions     Readmission Risk Interventions No flowsheet data found.

## 2019-09-27 NOTE — NC FL2 (Signed)
[place] LEVEL OF CARE SCREENING TOOL     IDENTIFICATION  Patient Name: Dominique [name] Birthdate: 1933-03-05 Sex: female Admission Date (Current Location): 09/25/2019  [place] [facility] [place] Number:  Herbalist and Address:  [facility],  [place], [place]      Provider Number: [id number]  Attending Physician Name and Address:  Lucky Cowboy, MD  Relative Name and Phone Number:  Trinda Milonas [phone]    Current Level of Care: Hospital Recommended Level of Care: [place] Prior Approval Number:    Date Approved/Denied:   PASRR Number: [id number]  Discharge Plan: SNF    Current Diagnoses: Patient Active Problem List   Diagnosis Date Noted  . UTI (urinary tract infection) 09/26/2019  . Multiple pelvic fractures ([place]) 09/25/2019  . Closed right humeral fracture 09/25/2019  . Acute lower UTI   . Dementia ([place])   . E. coli UTI 01/27/2019  . Hypokalemia 01/27/2019  . Weakness of right upper extremity 01/27/2019  . Weakness of right lower extremity 01/27/2019  . Sacral wound   . Acute encephalopathy 01/26/2019  . Falls 01/26/2019  . Pressure injury of skin 01/26/2019  . Abnormal CT of the chest 07/14/2018  . 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 PAF (paroxysmal atrial fibrillation) ([place]) 11/26/2017  . Atrial fibrillation with RVR ([place])   . Essential hypertension   . History of uterine cancer 09/02/2016  . Benign essential HTN 09/02/2016  . Hypothyroidism 09/02/2016  . History of shingles 09/02/2016  . History of iron deficiency anemia 09/02/2016  . History of thrombocytopenia 09/02/2016    Orientation RESPIRATION BLADDER Height & Weight     Self  Normal Incontinent Weight: 59.3 kg Height:  4' 11.5" (151.1 cm)  BEHAVIORAL SYMPTOMS/MOOD NEUROLOGICAL BOWEL NUTRITION STATUS      Incontinent Diet(Regular)  AMBULATORY STATUS COMMUNICATION OF NEEDS Skin   Limited Assist Verbally Bruising(R  knee bruise from fall.)                       Personal Care Assistance Level of Assistance  Bathing, Dressing Bathing Assistance: Limited assistance   Dressing Assistance: Limited assistance     Functional Limitations Info  Sight, Hearing Sight Info: Adequate Hearing Info: Impaired(HOH)      SPECIAL CARE FACTORS FREQUENCY  PT (By licensed PT), OT (By licensed OT)     PT Frequency: 5x week OT Frequency: 5x week            Contractures Contractures Info: Not present    Additional Factors Info  Code Status, Allergies Code Status Info: Full code Allergies Info: Codeine, Darvocet Propoxyphene N-acetaminophen, Demerol, Percocet Oxycodone-acetaminophen, Prednisone           Current Medications (09/27/2019):  This is the current hospital active medication list Current Facility-Administered Medications  Medication Dose Route Frequency Provider Last Rate Last Admin  . acetaminophen (TYLENOL) tablet 650 mg  650 mg Oral Q6H PRN Reubin Milan, MD   650 mg at 09/27/19 0544   Or  . acetaminophen (TYLENOL) suppository 650 mg  650 mg Rectal Q6H PRN Reubin Milan, MD      . aspirin tablet 325 mg  325 mg Oral Daily Reubin Milan, MD   325 mg at 09/27/19 [id number]  . cefTRIAXone (ROCEPHIN) 1 g in sodium chloride 0.9 % 100 mL IVPB  1 g Intravenous Q24H Reubin Milan, MD 200 mL/hr at 09/26/19 1642 1  g at 09/26/19 1642  . clotrimazole [facility] [place] [facility]) troche 10 mg  10 mg Oral 5 X Daily PRN Reubin Milan, MD      . donepezil (ARICEPT) tablet 5 mg  5 mg Oral Daily Reubin Milan, MD   5 mg at 09/27/19 [id number]  . feeding supplement (PRO-STAT SUGAR FREE 64) liquid 30 mL  30 mL Oral BID Reubin Milan, MD   30 mL at 09/27/19 [id number]  . fentaNYL (SUBLIMAZE) injection 25 mcg  25 mcg Intravenous Q2H PRN Reubin Milan, MD   25 mcg at 09/27/19 [phone]  . levothyroxine (SYNTHROID) tablet 50 mcg  50 mcg Oral Q0600 Reubin Milan, MD   50 mcg at 09/27/19 0543  .  metoprolol tartrate (LOPRESSOR) tablet 50 mg  50 mg Oral Daily Reubin Milan, MD   50 mg at 09/27/19 [id number]  . prochlorperazine (COMPAZINE) injection 5 mg  5 mg Intravenous Q6H PRN Reubin Milan, MD      . simvastatin (ZOCOR) tablet 20 mg  20 mg Oral Daily Reubin Milan, MD   20 mg at 09/27/19 [id number]     Discharge Medications: Please see discharge summary for a list of discharge medications.  Relevant Imaging Results:  Relevant Lab Results:   Additional Information [id number] [place]  [place], [place], [place]

## 2019-09-27 NOTE — Discharge Summary (Signed)
Physician Discharge Summary  Dominique [name] [id number] DOB: [birth year] DOA: 09/25/2019  PCP: [name], MD  Admit date: 09/25/2019 Discharge date: 09/27/2019  Admitted From: [facility]  Disposition:  SNF  Recommendations for Outpatient Follow-up:  1. Please complete antibiotic course for Ecoli UTI 2. Aspirin has been discontinue given the bruising and pelvic hemorrhage. Recommend restarting when deemed appropriate.  3. Recommend repeat cbc in a week to check for Hb  4. She will need to follow up with Orthopedic outpatient in 2-3 weeks  5.   Opioids avoided due to age, dementia and prior h/o mention of allergy to narcotics. Tylenol prn for pain control.    Home Health: No Equipment/Devices:none    Discharge Condition: Stable CODE STATUS: Full code Diet recommendation: regular diet   Brief/Interim Summary: Dominique [name] is a 85 y.o. female with medical history significant of normocytic anemia, uterine cancer, hypertension, hyponatremia, hypothyroidism, LVH, paroxysmal atrial fibrillation, herpes zoster, thrombocytopenia who is brought from her nursing facility after having a fall injuring her right side of lower extremities while she was sitting on the toilet. She was found to have multiple pelvic fractures and proximal humerus fracture which were deemed non operative by orthopedic and recommended PT/OT. Per ortho recommendations: 'WBAT RLE, platform extension for RUE attached to walker as she won-t be able to bear weight through her arm due to the humerus fracture, RUE sling immobilization. OK to do pendulum exercises.' She was also found to have Ecoli UTI for which she received 2 doses of ceftriaxone and prescribed nitrofurantoin to complete a total of 5 days. She was found to have a hemoglobin drop from ~10 to ~7 thought to be secondary to hemodilution with IV fluid resuscitation and hemorrhage/ bruising related to fractures. No overt bleeding and remained hemodynamically  stable.   Discharge Diagnoses:  Principal Problem:   Multiple pelvic fractures ([place]) Active Problems:   Hypothyroidism   Essential hypertension   PAF (paroxysmal atrial fibrillation) (HCC)   Acute lower UTI   Dementia (HCC)   Closed right humeral fracture   UTI (urinary tract infection)    Discharge Instructions:  Discharge Instructions    Call MD for:  persistant dizziness or light-headedness   Complete by: As directed    Call MD for:  persistant nausea and vomiting   Complete by: As directed    Diet - low sodium heart healthy   Complete by: As directed    Discharge instructions   Complete by: As directed    Please complete course of antibiotic for the UTI   Increase activity slowly   Complete by: As directed      Allergies as of 09/27/2019      Reactions   Codeine Other (See Comments)   Unknown per son   Darvocet [propoxyphene N-acetaminophen] Other (See Comments)   Unknown per son   Demerol Other (See Comments)   Unknown per son   Percocet [oxycodone-acetaminophen] Other (See Comments)   Unknown per son   Prednisone    Uncertain this is true. Was documented when patient was admitted 02/12/2012, but not recalled by either the patient nor her son. Tolerating it easily 08/2016.       Medication List    STOP taking these medications   aspirin 325 MG tablet   enoxaparin 30 MG/0.3ML injection Commonly known as: LOVENOX     TAKE these medications   acetaminophen 500 MG tablet Commonly known as: TYLENOL Take 500 mg by mouth every 6 (six) hours as  needed for mild pain or headache.   clotrimazole 10 MG troche Commonly known as: MYCELEX Take 10 mg by mouth 5 (five) times daily as needed (thrush).   donepezil 5 MG tablet Commonly known as: ARICEPT Take 5 mg by mouth daily.   feeding supplement (ENSURE ENLIVE) Liqd Take 237 mLs by mouth 2 (two) times daily between meals.   feeding supplement (PRO-STAT SUGAR FREE 64) Liqd Take 30 mLs by mouth 2 (two) times  daily.   levothyroxine 50 MCG tablet Commonly known as: SYNTHROID Take 50 mcg by mouth daily before breakfast.   metoprolol tartrate 50 MG tablet Commonly known as: LOPRESSOR Take 50 mg by mouth daily.   multivitamin with minerals Tabs tablet Take 1 tablet by mouth daily.   nitrofurantoin (macrocrystal-monohydrate) 100 MG capsule Commonly known as: Macrobid Take 1 capsule (100 mg total) by mouth 2 (two) times daily for 3 days.   simvastatin 20 MG tablet Commonly known as: ZOCOR Take 20 mg by mouth daily.       Allergies  Allergen Reactions  . Codeine Other (See Comments)    Unknown per son  . Darvocet [Propoxyphene N-Acetaminophen] Other (See Comments)    Unknown per son  . Demerol Other (See Comments)    Unknown per son  . Percocet [Oxycodone-Acetaminophen] Other (See Comments)    Unknown per son  . Prednisone     Uncertain this is true. Was documented when patient was admitted 02/12/2012, but not recalled by either the patient nor her son. Tolerating it easily 08/2016.     Consultations:  Orthopedic   Procedures/Studies: CT PELVIS WO CONTRAST  Result Date: 09/25/2019 CLINICAL DATA:  Right hip pain following a fall. EXAM: CT PELVIS WITHOUT CONTRAST TECHNIQUE: Multidetector CT imaging of the pelvis was performed following the standard protocol without intravenous contrast. COMPARISON:  None. FINDINGS: Urinary Tract: There is some displacement of the bladder to the left by hemorrhage in the right pelvis. Bowel:  Multiple sigmoid colon diverticula. Vascular/Lymphatic: Atheromatous arterial calcifications without aneurysm. No enlarged lymph nodes. Reproductive:  No mass or other significant abnormality Other: Areas of hemorrhage in the anterior aspect of the pelvis, right greater than left. Musculoskeletal: Mildly comminuted longitudinal fracture of the right sacral ala. Comminuted right pubic body and superior pubic ramus fracture and mildly displaced and mildly comminuted  fracture of the right inferior pubic ramus. Lumbar and lower thoracic spine degenerative changes. These include facet degenerative changes with associated grade 1 anterolisthesis at the L4-5 level. IMPRESSION: 1. Mildly comminuted longitudinal fracture of the right sacral ala. 2. Comminuted right pubic body and superior pubic ramus fracture and mildly displaced and mildly comminuted fracture of the right inferior pubic ramus. 3. Areas of hemorrhage in the anterior aspect of the pelvis, right greater than left. Electronically Signed   By: Claudie Revering M.D.   On: 09/25/2019 12:40   DG HumerUS Right  Result Date: 09/25/2019 CLINICAL DATA:  Proximal right humerus pain following a fall yesterday. EXAM: RIGHT HUMERUS - 2+ VIEW COMPARISON:  01/27/2019 FINDINGS: Interval comminuted, impacted fracture of the right humeral neck and metaphysis. There is posterior displacement and anterior angulation of the distal fragment. There is also flattening of the superior aspect of the humeral head. Diffuse osteopenia. IMPRESSION: Comminuted, impacted and displaced fracture of the right humeral neck and metaphysis, as described above. Electronically Signed   By: Claudie Revering M.D.   On: 09/25/2019 11:46   DG Hip Unilat With Pelvis 2-3 Views Right  Result Date: 09/25/2019  CLINICAL DATA:  Right hip pain following a fall yesterday. EXAM: DG HIP (WITH OR WITHOUT PELVIS) 2-3V RIGHT COMPARISON:  01/25/2019 FINDINGS: Interval comminuted fractures involving the right pubic body and medial aspects of the superior and inferior pubic rami. Normal appearing right hip without fracture or dislocation. Bilateral pelvic surgical clips. Diffuse osteopenia. IMPRESSION: Comminuted right pubic bone fractures, as described above. Electronically Signed   By: Claudie Revering M.D.   On: 09/25/2019 11:44    Subjective: No acute events overnight except for mild agitation but able to verbally re-orient with a sitter at bedside. This morning she looks  comfortable and conversing well. Following commands. Says she hurts all over but feels fine otherwise. She is hungry and okay to return to SNF.   Discharge Exam: Vitals:   09/26/19 2102 09/27/19 0553  BP: (!) 143/56 (!) 153/62  Pulse: 71 67  Resp: 18 16  Temp: 98 F (36.7 C) 98.3 F (36.8 C)  SpO2: 98% 96%   Vitals:   09/26/19 1731 09/26/19 2102 09/26/19 2145 09/27/19 0553  BP: (!) 180/74 (!) 143/56  (!) 153/62  Pulse: 70 71  67  Resp: 20 18  16   Temp: 98.3 F (36.8 C) 98 F (36.7 C)  98.3 F (36.8 C)  TempSrc: Oral     SpO2: 95% 98%  96%  Weight:   59.3 kg   Height: 4' 11.5" (1.511 m)  4' 11.5" (1.511 m)     General exam: Appears calm and comfortable  Respiratory system: Clear to auscultation. Respiratory effort normal. Cardiovascular system: S1 & S2 heard, RRR. No JVD. No pedal edema. Gastrointestinal system: Abdomen is nondistended, soft and tender in the pelvic area. Normal bowel sounds heard.   Central nervous system: Alert and oriented to self.  Moves all extremities voluntarily Extremities: Right knee bruising present Skin: No obvious rash Psychiatry:  Mood & affect appropriate.    The results of significant diagnostics from this hospitalization (including imaging, microbiology, ancillary and laboratory) are listed below for reference.     Microbiology: Recent Results (from the past 240 hour(s))  Culture, Urine     Status: Abnormal   Collection Time: 09/25/19 10:38 AM   Specimen: Urine, Catheterized  Result Value Ref Range Status   Specimen Description   Final    URINE, CATHETERIZED Performed at [facility], [place] [address], [place], [place] [address]    Special Requests   Final    NONE Performed at [facility], [place] [address] [place], [place] [address]    Culture >=100,000 COLONIES/mL ESCHERICHIA COLI (A)  Final   Report Status 09/27/2019 FINAL  Final   Organism ID, Bacteria ESCHERICHIA COLI (A)  Final       Susceptibility   Escherichia coli - MIC*    AMPICILLIN >=32 RESISTANT Resistant     CEFAZOLIN >=64 RESISTANT Resistant     CEFTRIAXONE <=1 SENSITIVE Sensitive     CIPROFLOXACIN <=0.25 SENSITIVE Sensitive     GENTAMICIN <=1 SENSITIVE Sensitive     IMIPENEM <=0.25 SENSITIVE Sensitive     NITROFURANTOIN <=16 SENSITIVE Sensitive     TRIMETH/SULFA >=320 RESISTANT Resistant     AMPICILLIN/SULBACTAM >=32 RESISTANT Resistant     PIP/TAZO 64 INTERMEDIATE Intermediate     * >=100,000 COLONIES/mL ESCHERICHIA COLI  Respiratory Panel by RT PCR (Flu A&B, Covid) - Urine, Clean Catch     Status: None   Collection Time: 09/25/19  1:18 PM   Specimen: Urine, Clean Catch  Result  Value Ref Range Status   SARS Coronavirus 2 by RT PCR NEGATIVE NEGATIVE Final    Comment: (NOTE) SARS-CoV-2 target nucleic acids are NOT DETECTED. The SARS-CoV-2 RNA is generally detectable in upper respiratoy specimens during the acute phase of infection. The lowest concentration of SARS-CoV-2 viral copies this assay can detect is 131 copies/mL. A negative result does not preclude SARS-Cov-2 infection and should not be used as the sole basis for treatment or other patient management decisions. A negative result may occur with  improper specimen collection/handling, submission of specimen other than nasopharyngeal swab, presence of viral mutation(s) within the areas targeted by this assay, and inadequate number of viral copies (<131 copies/mL). A negative result must be combined with clinical observations, patient history, and epidemiological information. The expected result is Negative. Fact Sheet for Patients:  PinkCheek.be Fact Sheet for Healthcare Providers:  GravelBags.it This test is not yet ap proved or cleared by the [place] FDA and  has been authorized for detection and/or diagnosis of SARS-CoV-2 by FDA under an Emergency Use Authorization (EUA). This  EUA will remain  in effect (meaning this test can be used) for the duration of the COVID-19 declaration under Section 564(b)(1) of the Act, 21 U.S.C. section 360bbb-3(b)(1), unless the authorization is terminated or revoked sooner.    Influenza A by PCR NEGATIVE NEGATIVE Final   Influenza B by PCR NEGATIVE NEGATIVE Final    Comment: (NOTE) The Xpert Xpress SARS-CoV-2/FLU/RSV assay is intended as an aid in  the diagnosis of influenza from Nasopharyngeal swab specimens and  should not be used as a sole basis for treatment. Nasal washings and  aspirates are unacceptable for Xpert Xpress SARS-CoV-2/FLU/RSV  testing. Fact Sheet for Patients: PinkCheek.be Fact Sheet for Healthcare Providers: GravelBags.it This test is not yet approved or cleared by the [place] FDA and  has been authorized for detection and/or diagnosis of SARS-CoV-2 by  FDA under an Emergency Use Authorization (EUA). This EUA will remain  in effect (meaning this test can be used) for the duration of the  Covid-19 declaration under Section 564(b)(1) of the Act, 21  U.S.C. section 360bbb-3(b)(1), unless the authorization is  terminated or revoked. Performed at [facility], [place] [address], [place], [place] [address]      Labs: BNP (last 3 results) No results for input(s): BNP in the last 8760 hours. Basic Metabolic Panel: Recent Labs  Lab 09/25/19 1038  NA 133*  K 4.0  CL 99  CO2 22  GLUCOSE 169*  BUN 20  CREATININE 0.59  CALCIUM 8.9   Liver Function Tests: Recent Labs  Lab 09/25/19 1038  AST 30  ALT 21  ALKPHOS 60  BILITOT 0.6  PROT 6.7  ALBUMIN 3.4*   No results for input(s): LIPASE, AMYLASE in the last 168 hours. No results for input(s): AMMONIA in the last 168 hours. CBC: Recent Labs  Lab 09/25/19 1038 09/27/19 0958  WBC 24.4* 13.4*  NEUTROABS 21.6*  --   HGB 10.5* 7.1*  HCT 32.8* 21.8*  MCV 97.0 96.5   PLT 315 194   Cardiac Enzymes: No results for input(s): CKTOTAL, CKMB, CKMBINDEX, TROPONINI in the last 168 hours. BNP: Invalid input(s): POCBNP CBG: No results for input(s): GLUCAP in the last 168 hours. D-Dimer No results for input(s): DDIMER in the last 72 hours. Hgb A1c No results for input(s): HGBA1C in the last 72 hours. Lipid Profile No results for input(s): CHOL, HDL, LDLCALC, TRIG, CHOLHDL, LDLDIRECT in the last 72 hours.  Thyroid function studies No results for input(s): TSH, T4TOTAL, T3FREE, THYROIDAB in the last 72 hours.  Invalid input(s): FREET3 Anemia work up No results for input(s): VITAMINB12, FOLATE, FERRITIN, TIBC, IRON, RETICCTPCT in the last 72 hours. Urinalysis    Component Value Date/Time   COLORURINE AMBER (A) 09/25/2019 1038   APPEARANCEUR HAZY (A) 09/25/2019 1038   LABSPEC 1.017 09/25/2019 1038   PHURINE 6.0 09/25/2019 1038   GLUCOSEU NEGATIVE 09/25/2019 1038   HGBUR MODERATE (A) 09/25/2019 1038   BILIRUBINUR NEGATIVE 09/25/2019 1038   KETONESUR NEGATIVE 09/25/2019 1038   PROTEINUR 30 (A) 09/25/2019 1038   UROBILINOGEN 0.2 01/14/2010 1406   NITRITE POSITIVE (A) 09/25/2019 1038   LEUKOCYTESUR SMALL (A) 09/25/2019 1038   Sepsis Labs Invalid input(s): PROCALCITONIN,  WBC,  LACTICIDVEN Microbiology Recent Results (from the past 240 hour(s))  Culture, Urine     Status: Abnormal   Collection Time: 09/25/19 10:38 AM   Specimen: Urine, Catheterized  Result Value Ref Range Status   Specimen Description   Final    URINE, CATHETERIZED Performed at [facility], [place] [address] [place], [place] [address]    Special Requests   Final    NONE Performed at [facility], [place] [address], [place], [place] [address]    Culture >=100,000 COLONIES/mL ESCHERICHIA COLI (A)  Final   Report Status 09/27/2019 FINAL  Final   Organism ID, Bacteria ESCHERICHIA COLI (A)  Final      Susceptibility   Escherichia coli - MIC*     AMPICILLIN >=32 RESISTANT Resistant     CEFAZOLIN >=64 RESISTANT Resistant     CEFTRIAXONE <=1 SENSITIVE Sensitive     CIPROFLOXACIN <=0.25 SENSITIVE Sensitive     GENTAMICIN <=1 SENSITIVE Sensitive     IMIPENEM <=0.25 SENSITIVE Sensitive     NITROFURANTOIN <=16 SENSITIVE Sensitive     TRIMETH/SULFA >=320 RESISTANT Resistant     AMPICILLIN/SULBACTAM >=32 RESISTANT Resistant     PIP/TAZO 64 INTERMEDIATE Intermediate     * >=100,000 COLONIES/mL ESCHERICHIA COLI  Respiratory Panel by RT PCR (Flu A&B, Covid) - Urine, Clean Catch     Status: None   Collection Time: 09/25/19  1:18 PM   Specimen: Urine, Clean Catch  Result Value Ref Range Status   SARS Coronavirus 2 by RT PCR NEGATIVE NEGATIVE Final    Comment: (NOTE) SARS-CoV-2 target nucleic acids are NOT DETECTED. The SARS-CoV-2 RNA is generally detectable in upper respiratoy specimens during the acute phase of infection. The lowest concentration of SARS-CoV-2 viral copies this assay can detect is 131 copies/mL. A negative result does not preclude SARS-Cov-2 infection and should not be used as the sole basis for treatment or other patient management decisions. A negative result may occur with  improper specimen collection/handling, submission of specimen other than nasopharyngeal swab, presence of viral mutation(s) within the areas targeted by this assay, and inadequate number of viral copies (<131 copies/mL). A negative result must be combined with clinical observations, patient history, and epidemiological information. The expected result is Negative. Fact Sheet for Patients:  PinkCheek.be Fact Sheet for Healthcare Providers:  GravelBags.it This test is not yet ap proved or cleared by the [place] FDA and  has been authorized for detection and/or diagnosis of SARS-CoV-2 by FDA under an Emergency Use Authorization (EUA). This EUA will remain  in effect (meaning this test  can be used) for the duration of the COVID-19 declaration under Section 564(b)(1) of the Act, 21 U.S.C. section 360bbb-3(b)(1), unless the authorization  is terminated or revoked sooner.    Influenza A by PCR NEGATIVE NEGATIVE Final   Influenza B by PCR NEGATIVE NEGATIVE Final    Comment: (NOTE) The Xpert Xpress SARS-CoV-2/FLU/RSV assay is intended as an aid in  the diagnosis of influenza from Nasopharyngeal swab specimens and  should not be used as a sole basis for treatment. Nasal washings and  aspirates are unacceptable for Xpert Xpress SARS-CoV-2/FLU/RSV  testing. Fact Sheet for Patients: PinkCheek.be Fact Sheet for Healthcare Providers: GravelBags.it This test is not yet approved or cleared by the [place] FDA and  has been authorized for detection and/or diagnosis of SARS-CoV-2 by  FDA under an Emergency Use Authorization (EUA). This EUA will remain  in effect (meaning this test can be used) for the duration of the  Covid-19 declaration under Section 564(b)(1) of the Act, 21  U.S.C. section 360bbb-3(b)(1), unless the authorization is  terminated or revoked. Performed at [facility], [place] [address] [place], [place] [address]      Time coordinating discharge: Over 30 minutes  SIGNED:   Lucky Cowboy, MD  [facility] 09/27/2019, 12:35 PM  If 7PM-7AM, please contact night-coverage

## 2020-09-30 ENCOUNTER — Emergency Department (HOSPITAL_COMMUNITY)
Admission: EM | Admit: 2020-09-30 | Discharge: 2020-10-01 | Disposition: A | Discharge status: Home / Self Care | Payer: Medicare Other | Attending: Emergency Medicine | Admitting: Emergency Medicine

## 2020-09-30 DIAGNOSIS — N3000 Acute cystitis without hematuria: Secondary | ICD-10-CM | POA: Diagnosis not present

## 2020-09-30 DIAGNOSIS — E039 Hypothyroidism, unspecified: Secondary | ICD-10-CM | POA: Diagnosis not present

## 2020-09-30 DIAGNOSIS — I10 Essential (primary) hypertension: Secondary | ICD-10-CM | POA: Insufficient documentation

## 2020-09-30 DIAGNOSIS — Z8542 Personal history of malignant neoplasm of other parts of uterus: Secondary | ICD-10-CM | POA: Diagnosis not present

## 2020-09-30 DIAGNOSIS — R5383 Other fatigue: Secondary | ICD-10-CM | POA: Diagnosis present

## 2020-09-30 DIAGNOSIS — Z79899 Other long term (current) drug therapy: Secondary | ICD-10-CM | POA: Diagnosis not present

## 2020-09-30 DIAGNOSIS — F0390 Unspecified dementia without behavioral disturbance: Secondary | ICD-10-CM | POA: Insufficient documentation

## 2020-09-30 NOTE — ED Triage Notes (Signed)
Pt to the ER after staff states pt has been more tired than normal. Last seen normal was Thursday.

## 2020-09-30 NOTE — ED Notes (Signed)
Initial contact with pt. Pt is alert to person only. Poor historian and unaware of baseline. purewick placed. Pt on monitor x4. Will continue to monitor.

## 2020-09-30 NOTE — ED Notes (Signed)
ED Provider at bedside. °

## 2020-10-01 ENCOUNTER — Emergency Department (HOSPITAL_COMMUNITY): Payer: Medicare Other

## 2020-10-01 ENCOUNTER — Encounter (HOSPITAL_COMMUNITY): Payer: Self-pay

## 2020-10-01 LAB — CBC WITH DIFFERENTIAL/PLATELET
Abs Immature Granulocytes: 0.03 10*3/uL (ref 0.00–0.07)
Basophils Absolute: 0.1 10*3/uL (ref 0.0–0.1)
Basophils Relative: 1 %
Eosinophils Absolute: 0.6 10*3/uL — ABNORMAL HIGH (ref 0.0–0.5)
Eosinophils Relative: 6 %
HCT: 36 % (ref 36.0–46.0)
Hemoglobin: 11.9 g/dL — ABNORMAL LOW (ref 12.0–15.0)
Immature Granulocytes: 0 %
Lymphocytes Relative: 37 %
Lymphs Abs: 3.4 10*3/uL (ref 0.7–4.0)
MCH: 34.2 pg — ABNORMAL HIGH (ref 26.0–34.0)
MCHC: 33.1 g/dL (ref 30.0–36.0)
MCV: 103.4 fL — ABNORMAL HIGH (ref 80.0–100.0)
Monocytes Absolute: 1 10*3/uL (ref 0.1–1.0)
Monocytes Relative: 11 %
Neutro Abs: 4 10*3/uL (ref 1.7–7.7)
Neutrophils Relative %: 45 %
Platelets: 371 10*3/uL (ref 150–400)
RBC: 3.48 MIL/uL — ABNORMAL LOW (ref 3.87–5.11)
RDW: 12.6 % (ref 11.5–15.5)
WBC: 9.1 10*3/uL (ref 4.0–10.5)
nRBC: 0 % (ref 0.0–0.2)

## 2020-10-01 LAB — AMMONIA: Ammonia: 27 umol/L (ref 9–35)

## 2020-10-01 LAB — URINALYSIS, COMPLETE (UACMP) WITH MICROSCOPIC
Bacteria, UA: NONE SEEN
Bilirubin Urine: NEGATIVE
Glucose, UA: NEGATIVE mg/dL
Hgb urine dipstick: NEGATIVE
Ketones, ur: NEGATIVE mg/dL
Nitrite: POSITIVE — AB
Protein, ur: NEGATIVE mg/dL
Specific Gravity, Urine: 1.014 (ref 1.005–1.030)
pH: 6 (ref 5.0–8.0)

## 2020-10-01 LAB — COMPREHENSIVE METABOLIC PANEL
ALT: 14 U/L (ref 0–44)
AST: 24 U/L (ref 15–41)
Albumin: 3.5 g/dL (ref 3.5–5.0)
Alkaline Phosphatase: 62 U/L (ref 38–126)
Anion gap: 7 (ref 5–15)
BUN: 12 mg/dL (ref 8–23)
CO2: 27 mmol/L (ref 22–32)
Calcium: 8.9 mg/dL (ref 8.9–10.3)
Chloride: 101 mmol/L (ref 98–111)
Creatinine, Ser: 0.63 mg/dL (ref 0.44–1.00)
GFR, Estimated: >60 mL/min (ref >60)
Glucose, Bld: 82 mg/dL (ref 70–99)
Potassium: 4 mmol/L (ref 3.5–5.1)
Sodium: 135 mmol/L (ref 135–145)
Total Bilirubin: 0.5 mg/dL (ref 0.3–1.2)
Total Protein: 7 g/dL (ref 6.5–8.1)

## 2020-10-01 LAB — BLOOD GAS, VENOUS
Acid-Base Excess: 1.9 mmol/L (ref 0.0–2.0)
Bicarbonate: 27.9 mmol/L (ref 20.0–28.0)
O2 Saturation: 89.9 %
Patient temperature: 98.6
pCO2, Ven: 52.9 mmHg (ref 44.0–60.0)
pH, Ven: 7.342 (ref 7.250–7.430)
pO2, Ven: 61.3 mmHg — ABNORMAL HIGH (ref 32.0–45.0)

## 2020-10-01 LAB — MAGNESIUM: Magnesium: 2.2 mg/dL (ref 1.7–2.4)

## 2020-10-01 LAB — TROPONIN I (HIGH SENSITIVITY): Troponin I (High Sensitivity): 6 ng/L (ref <18)

## 2020-10-01 LAB — CBG MONITORING, ED: Glucose-Capillary: 79 mg/dL (ref 70–99)

## 2020-10-01 MED ORDER — CEPHALEXIN 500 MG PO CAPS: 500.0000 mg | ORAL_CAPSULE | Freq: Four times a day (QID) | ORAL | 0 refills | Status: DC

## 2020-10-01 MED ORDER — SODIUM CHLORIDE 0.9 % IV SOLN
2.0000 g | Freq: Once | INTRAVENOUS | Status: AC
  Administered 2020-10-01: 04:00:00 2 g via INTRAVENOUS
  Filled 2020-10-01: qty 20

## 2020-10-01 NOTE — ED Notes (Signed)
[facility] bedside for transport back to [place]. Pt son notified that pt is being discharged

## 2020-10-01 NOTE — ED Notes (Signed)
[facility] has been called to arrange transport for pt. Charge nurse made aware.

## 2020-10-01 NOTE — ED Provider Notes (Signed)
[facility] DEPT Provider Note   CSN: [id number] Arrival date & time: 09/30/20  2203     History Chief Complaint  Patient presents with  . Fatigue    Pt to the ER after staff states pt has been more tired than normal. Last seen normal was Thursday.    Dominique [name] is a 86 y.o. female.  86 year old female with multiple medical problems as documented below who presents to the emergency department today with a change in mental status.  Apparently patient is a little bit more sleepy than normal but is at her baseline confusion.  Sent here from the facility for further evaluation.  Patient answers most questions with I do not know aside from her birthday and her name.  Level 5 caveat applies secondary to severe dementia.        Past Medical History:  Diagnosis Date  . Anemia   . Cancer ([place])    uterine  . Hypertension   . Hyponatremia   . Hypothyroidism   . LVH (left ventricular hypertrophy)   . PAF (paroxysmal atrial fibrillation) ([place])    a. initial dx 2012 in setting of acute illness. b. began to recur 09/2017; started on amiodarone 11/2017.  Marland Kitchen Shingles   . Thrombocytopenia [facility])     Patient Active Problem List   Diagnosis Date Noted  . UTI (urinary tract infection) 09/26/2019  . Multiple pelvic fractures ([place]) 09/25/2019  . Closed right humeral fracture 09/25/2019  . Acute lower UTI   . Dementia ([place])   . E. coli UTI 01/27/2019  . Hypokalemia 01/27/2019  . Weakness of right upper extremity 01/27/2019  . Weakness of right lower extremity 01/27/2019  . Sacral wound   . Acute encephalopathy 01/26/2019  . Falls 01/26/2019  . Pressure injury of skin 01/26/2019  . Abnormal CT of the chest 07/14/2018  . 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 PAF (paroxysmal atrial fibrillation) ([place]) 11/26/2017  . Atrial fibrillation with RVR ([place])   . Essential hypertension   . History of uterine cancer 09/02/2016  . Benign essential HTN  09/02/2016  . Hypothyroidism 09/02/2016  . History of shingles 09/02/2016  . History of iron deficiency anemia 09/02/2016  . History of thrombocytopenia 09/02/2016    Past Surgical History:  Procedure Laterality Date  . ABDOMINAL HYSTERECTOMY    . JOINT REPLACEMENT Bilateral    knees     OB History   No obstetric history on file.     Family History  Problem Relation Age of Onset  . Tuberculosis Father   . Alcoholism Brother   . Hypertension Son     Social History   Tobacco Use  . Smoking status: Never Smoker  . Smokeless tobacco: Never Used  Vaping Use  . Vaping Use: Never used  Substance Use Topics  . Alcohol use: No  . Drug use: No    Home Medications Prior to Admission medications   Medication Sig Start Date End Date Taking? Authorizing Provider  acetaminophen (TYLENOL) 500 MG tablet Take 500 mg by mouth every 6 (six) hours as needed for mild pain or headache.     [provider]  Amino Acids-Protein Hydrolys (FEEDING SUPPLEMENT, PRO-STAT SUGAR FREE 64,) LIQD Take 30 mLs by mouth 2 (two) times daily.    [provider]  cephALEXin (KEFLEX) 500 MG capsule Take 1 capsule (500 mg total) by mouth 4 (four) times daily. 10/01/20   Mohannad Olivero, Corene Cornea, MD  clotrimazole (MYCELEX) 10 MG troche Take  10 mg by mouth 5 (five) times daily as needed (thrush).     [provider]  donepezil (ARICEPT) 5 MG tablet Take 5 mg by mouth daily. 01/25/19   [provider]  feeding supplement, ENSURE ENLIVE, (ENSURE ENLIVE) LIQD Take 237 mLs by mouth 2 (two) times daily between meals. Patient not taking: Reported on 09/25/2019 01/30/19   Eugenie Filler, MD  levothyroxine (SYNTHROID, LEVOTHROID) 50 MCG tablet Take 50 mcg by mouth daily before breakfast.    [provider]  metoprolol tartrate (LOPRESSOR) 50 MG tablet Take 50 mg by mouth daily.    [provider]  Multiple Vitamin (MULTIVITAMIN WITH MINERALS) TABS tablet Take 1 tablet by  mouth daily. 01/31/19   Eugenie Filler, MD  simvastatin (ZOCOR) 20 MG tablet Take 20 mg by mouth daily.    [provider]    Allergies    Codeine, Darvocet [propoxyphene n-acetaminophen], Demerol, Percocet [oxycodone-acetaminophen], and Prednisone  Review of Systems   Review of Systems  Unable to perform ROS: Dementia    Physical Exam Updated Vital Signs BP (!) 149/65 (BP Location: Right Arm)   Pulse (!) 58   Temp 97.9 F (36.6 C) (Rectal)   Resp 17   SpO2 98%   Physical Exam Vitals and nursing note reviewed.  Constitutional:      Appearance: She is well-developed and well-nourished.  HENT:     Head: Normocephalic and atraumatic.     Nose: No congestion or rhinorrhea.  Cardiovascular:     Rate and Rhythm: Normal rate and regular rhythm.  Pulmonary:     Effort: No respiratory distress.     Breath sounds: No stridor.  Abdominal:     General: There is no distension.  Musculoskeletal:        General: No swelling, tenderness or deformity. Normal range of motion.     Cervical back: Normal range of motion.  Skin:    General: Skin is warm and dry.     Coloration: Skin is not jaundiced or pale.  Neurological:     Mental Status: She is alert. Mental status is at baseline.     ED Results / Procedures / Treatments   Labs (all labs ordered are listed, but only abnormal results are displayed) Labs Reviewed  CBC WITH DIFFERENTIAL/PLATELET - Abnormal; Notable for the following components:      Result Value   RBC 3.48 (*)    Hemoglobin 11.9 (*)    MCV 103.4 (*)    MCH 34.2 (*)    Eosinophils Absolute 0.6 (*)    All other components within normal limits  URINALYSIS, COMPLETE (UACMP) WITH MICROSCOPIC - Abnormal; Notable for the following components:   APPearance HAZY (*)    Nitrite POSITIVE (*)    Leukocytes,Ua TRACE (*)    All other components within normal limits  BLOOD GAS, VENOUS - Abnormal; Notable for the following components:   pO2, Ven 61.3 (*)     All other components within normal limits  URINE CULTURE  COMPREHENSIVE METABOLIC PANEL  AMMONIA  MAGNESIUM  CBG MONITORING, ED  TROPONIN I (HIGH SENSITIVITY)  TROPONIN I (HIGH SENSITIVITY)    EKG None  Radiology CT HEAD WO CONTRAST  Result Date: 10/01/2020 CLINICAL DATA:  Fatigue. EXAM: CT HEAD WITHOUT CONTRAST TECHNIQUE: Contiguous axial images were obtained from the base of the skull through the vertex without intravenous contrast. COMPARISON:  January 25, 2019 FINDINGS: Brain: There is mild cerebral atrophy with widening of the extra-axial spaces  and ventricular dilatation. There are areas of decreased attenuation within the white matter tracts of the supratentorial brain, consistent with microvascular disease changes. There is symmetric bilateral basal ganglia calcification. Vascular: No hyperdense vessels are identified. Skull: Normal. Negative for fracture or focal lesion. Sinuses/Orbits: No acute finding. Other: None. IMPRESSION: 1. Generalized cerebral atrophy. 2. No acute intracranial abnormality. Electronically Signed   By: Virgina Norfolk M.D.   On: 10/01/2020 00:35   DG Chest Port 1 View  Result Date: 10/01/2020 CLINICAL DATA:  Fatigue. EXAM: PORTABLE CHEST 1 VIEW COMPARISON:  January 27, 2019 FINDINGS: Diffuse chronic appearing increased lung markings are noted. Stable areas of scarring and/or atelectasis are seen along the infrahilar regions, bilaterally. There is no evidence of a pleural effusion or pneumothorax. The heart size and mediastinal contours are within normal limits. There is mild calcification of the aortic arch. Degenerative changes seen involving the bilateral shoulders and thoracic spine. IMPRESSION: Stable exam without acute or active cardiopulmonary disease. Electronically Signed   By: Virgina Norfolk M.D.   On: 10/01/2020 00:32    Procedures Procedures (including critical care time)  Medications Ordered in ED Medications  cefTRIAXone (ROCEPHIN) 2 g in  sodium chloride 0.9 % 100 mL IVPB (2 g Intravenous New Bag/Given 10/01/20 0347)    ED Course  I have reviewed the triage vital signs and the nursing notes.  Pertinent labs & imaging results that were available during my care of the patient were reviewed by me and considered in my medical decision making (see chart for details).    MDM Rules/Calculators/A&P                          Work-up ultimately reassuring aside from likely UTI.  Culture sent.  Start antibiotics.  No indication for admission.  No evidence of sepsis.  Patient appears to be stable for discharge back to the facility.  Final Clinical Impression(s) / ED Diagnoses Final diagnoses:  Other fatigue  Acute cystitis without hematuria    Rx / DC Orders ED Discharge Orders         Ordered    cephALEXin (KEFLEX) 500 MG capsule  4 times daily        10/01/20 0355           Effrey Davidow, Corene Cornea, MD 10/01/20 0401

## 2020-10-01 NOTE — ED Notes (Signed)
Called report to Saralyn Pilar, Therapist, sports at [facility]

## 2020-10-01 NOTE — ED Notes (Signed)
Patient transported to CT 

## 2020-10-01 NOTE — ED Notes (Signed)
Family called states they are going to bed and will check on her tomorrow.

## 2020-10-02 LAB — URINE CULTURE: Culture: >=100000 — AB

## 2020-10-03 ENCOUNTER — Telehealth: Payer: Self-pay

## 2020-10-03 NOTE — Progress Notes (Signed)
ED Antimicrobial Stewardship Positive Culture Follow Up   Dominique [name] is an 86 y.o. female who presented to [facility] on 09/30/2020 with a chief complaint of  Chief Complaint  Patient presents with  . Fatigue    Pt to the ER after staff states pt has been more tired than normal. Last seen normal was Thursday.    Recent Results (from the past 720 hour(s))  Urine culture     Status: Abnormal   Collection Time: 10/01/20  2:30 AM   Specimen: Urine, Clean Catch  Result Value Ref Range Status   Specimen Description   Final    URINE, CLEAN CATCH Performed at [facility], [place] [address] [place], [place] [address]    Special Requests   Final    NONE Performed at [facility], [place] [address] [place], [place] [address]    Culture >=100,000 COLONIES/mL ESCHERICHIA COLI (A)  Final   Report Status 10/02/2020 FINAL  Final   Organism ID, Bacteria ESCHERICHIA COLI (A)  Final      Susceptibility   Escherichia coli - MIC*    AMPICILLIN >=32 RESISTANT Resistant     CEFAZOLIN >=64 RESISTANT Resistant     CEFEPIME <=0.12 SENSITIVE Sensitive     CEFTRIAXONE <=0.25 SENSITIVE Sensitive     CIPROFLOXACIN <=0.25 SENSITIVE Sensitive     GENTAMICIN <=1 SENSITIVE Sensitive     IMIPENEM 0.5 SENSITIVE Sensitive     NITROFURANTOIN <=16 SENSITIVE Sensitive     TRIMETH/SULFA >=320 RESISTANT Resistant     AMPICILLIN/SULBACTAM >=32 RESISTANT Resistant     PIP/TAZO >=128 RESISTANT Resistant     * >=100,000 COLONIES/mL ESCHERICHIA COLI    [x]  Treated with Keflex, organism resistant to prescribed antimicrobial  Stop Keflex New antibiotic prescription: Macrobid 100 mg PO bid x 5 days (#10)  ED Provider: Gust Brooms, PA-C   [name] A 10/03/2020, 11:21 AM Clinical Pharmacist [phone]

## 2020-10-03 NOTE — Telephone Encounter (Signed)
UC with recommendations for ABX change faxed to [facility]  At [phone]  UC done at [facility] ED per Janeece Fitting PAc

## 2021-03-04 ENCOUNTER — Encounter: Payer: Self-pay | Admitting: Podiatry

## 2021-03-04 ENCOUNTER — Other Ambulatory Visit: Payer: Self-pay

## 2021-03-04 ENCOUNTER — Ambulatory Visit (INDEPENDENT_AMBULATORY_CARE_PROVIDER_SITE_OTHER): Payer: Medicare Other | Admitting: Podiatry

## 2021-03-04 DIAGNOSIS — B351 Tinea unguium: Secondary | ICD-10-CM | POA: Diagnosis not present

## 2021-03-04 DIAGNOSIS — M79675 Pain in left toe(s): Secondary | ICD-10-CM

## 2021-03-04 DIAGNOSIS — M79674 Pain in right toe(s): Secondary | ICD-10-CM

## 2021-03-04 NOTE — Progress Notes (Signed)
This patient returns to my office for at risk foot care.  This patient requires this care by a professional since this patient will be at risk due to having dementia and thrombocytopenia.  This patient is unable to cut nails herself since the patient cannot reach her nails.These nails are painful walking and wearing shoes.  This patient presents for at risk foot care today.  General Appearance  Alert, conversant and in no acute stress.  Vascular  Dorsalis pedis and posterior tibial  pulses are palpable  bilaterally.  Capillary return is within normal limits  bilaterally. Temperature is within normal limits  bilaterally.  Neurologic  Senn-Weinstein monofilament wire test within normal limits  bilaterally. Muscle power within normal limits bilaterally.  Nails Thick disfigured discolored nails with subungual debris  Hallux toenails  bilaterally. No evidence of bacterial infection or drainage bilaterally.  Orthopedic  No limitations of motion  feet .  No crepitus or effusions noted.  No bony pathology or digital deformities noted.  Skin  normotropic skin with no porokeratosis noted bilaterally.  No signs of infections or ulcers noted.     Onychomycosis  Pain in right toes  Pain in left toes  Consent was obtained for treatment procedures.   Mechanical debridement of nails 1-5  bilaterally performed with a nail nipper.  Filed with dremel without incident.    Return office visit     6 months                Told patient to return for periodic foot care and evaluation due to potential at risk complications.   Gardiner Barefoot DPM

## 2021-06-24 ENCOUNTER — Inpatient Hospital Stay (HOSPITAL_COMMUNITY): Payer: Medicare Other | Admitting: Anesthesiology

## 2021-06-24 ENCOUNTER — Emergency Department (HOSPITAL_COMMUNITY): Payer: Medicare Other

## 2021-06-24 ENCOUNTER — Inpatient Hospital Stay (HOSPITAL_COMMUNITY)
Admission: EM | Admit: 2021-06-24 | Discharge: 2021-07-14 | DRG: 037 | Disposition: A | Discharge status: Hospice | Payer: Medicare Other | Attending: Neurology | Admitting: Neurology

## 2021-06-24 ENCOUNTER — Encounter (HOSPITAL_COMMUNITY)
Admission: EM | Disposition: A | Discharge status: Hospice | Payer: Self-pay | Source: Home / Self Care | Attending: Neurology

## 2021-06-24 ENCOUNTER — Inpatient Hospital Stay (HOSPITAL_COMMUNITY): Payer: Medicare Other

## 2021-06-24 DIAGNOSIS — R29726 NIHSS score 26: Secondary | ICD-10-CM | POA: Diagnosis present

## 2021-06-24 DIAGNOSIS — E039 Hypothyroidism, unspecified: Secondary | ICD-10-CM | POA: Diagnosis present

## 2021-06-24 DIAGNOSIS — L89023 Pressure ulcer of left elbow, stage 3: Secondary | ICD-10-CM | POA: Diagnosis not present

## 2021-06-24 DIAGNOSIS — F0280 Dementia in other diseases classified elsewhere without behavioral disturbance: Secondary | ICD-10-CM | POA: Diagnosis present

## 2021-06-24 DIAGNOSIS — Z8542 Personal history of malignant neoplasm of other parts of uterus: Secondary | ICD-10-CM

## 2021-06-24 DIAGNOSIS — R4701 Aphasia: Secondary | ICD-10-CM | POA: Diagnosis present

## 2021-06-24 DIAGNOSIS — Z681 Body mass index (BMI) 19 or less, adult: Secondary | ICD-10-CM

## 2021-06-24 DIAGNOSIS — M810 Age-related osteoporosis without current pathological fracture: Secondary | ICD-10-CM | POA: Diagnosis present

## 2021-06-24 DIAGNOSIS — I482 Chronic atrial fibrillation, unspecified: Secondary | ICD-10-CM | POA: Diagnosis present

## 2021-06-24 DIAGNOSIS — D539 Nutritional anemia, unspecified: Secondary | ICD-10-CM | POA: Diagnosis present

## 2021-06-24 DIAGNOSIS — R627 Adult failure to thrive: Secondary | ICD-10-CM | POA: Diagnosis not present

## 2021-06-24 DIAGNOSIS — Z978 Presence of other specified devices: Secondary | ICD-10-CM | POA: Diagnosis not present

## 2021-06-24 DIAGNOSIS — I69354 Hemiplegia and hemiparesis following cerebral infarction affecting left non-dominant side: Secondary | ICD-10-CM | POA: Diagnosis not present

## 2021-06-24 DIAGNOSIS — R4182 Altered mental status, unspecified: Secondary | ICD-10-CM

## 2021-06-24 DIAGNOSIS — I639 Cerebral infarction, unspecified: Secondary | ICD-10-CM

## 2021-06-24 DIAGNOSIS — Z8249 Family history of ischemic heart disease and other diseases of the circulatory system: Secondary | ICD-10-CM

## 2021-06-24 DIAGNOSIS — I4891 Unspecified atrial fibrillation: Secondary | ICD-10-CM | POA: Diagnosis not present

## 2021-06-24 DIAGNOSIS — L8990 Pressure ulcer of unspecified site, unspecified stage: Secondary | ICD-10-CM

## 2021-06-24 DIAGNOSIS — J9622 Acute and chronic respiratory failure with hypercapnia: Secondary | ICD-10-CM | POA: Diagnosis not present

## 2021-06-24 DIAGNOSIS — J9601 Acute respiratory failure with hypoxia: Secondary | ICD-10-CM | POA: Diagnosis not present

## 2021-06-24 DIAGNOSIS — E43 Unspecified severe protein-calorie malnutrition: Secondary | ICD-10-CM | POA: Diagnosis not present

## 2021-06-24 DIAGNOSIS — Z90710 Acquired absence of both cervix and uterus: Secondary | ICD-10-CM

## 2021-06-24 DIAGNOSIS — Z96653 Presence of artificial knee joint, bilateral: Secondary | ICD-10-CM | POA: Diagnosis present

## 2021-06-24 DIAGNOSIS — J9621 Acute and chronic respiratory failure with hypoxia: Secondary | ICD-10-CM | POA: Diagnosis not present

## 2021-06-24 DIAGNOSIS — I63511 Cerebral infarction due to unspecified occlusion or stenosis of right middle cerebral artery: Secondary | ICD-10-CM

## 2021-06-24 DIAGNOSIS — E785 Hyperlipidemia, unspecified: Secondary | ICD-10-CM | POA: Diagnosis present

## 2021-06-24 DIAGNOSIS — R54 Age-related physical debility: Secondary | ICD-10-CM | POA: Diagnosis present

## 2021-06-24 DIAGNOSIS — I959 Hypotension, unspecified: Secondary | ICD-10-CM | POA: Diagnosis present

## 2021-06-24 DIAGNOSIS — Z515 Encounter for palliative care: Secondary | ICD-10-CM

## 2021-06-24 DIAGNOSIS — Z9181 History of falling: Secondary | ICD-10-CM

## 2021-06-24 DIAGNOSIS — Z4659 Encounter for fitting and adjustment of other gastrointestinal appliance and device: Secondary | ICD-10-CM | POA: Diagnosis not present

## 2021-06-24 DIAGNOSIS — R1310 Dysphagia, unspecified: Secondary | ICD-10-CM | POA: Diagnosis present

## 2021-06-24 DIAGNOSIS — Z7189 Other specified counseling: Secondary | ICD-10-CM | POA: Diagnosis not present

## 2021-06-24 DIAGNOSIS — Z20822 Contact with and (suspected) exposure to covid-19: Secondary | ICD-10-CM | POA: Diagnosis present

## 2021-06-24 DIAGNOSIS — E876 Hypokalemia: Secondary | ICD-10-CM | POA: Diagnosis not present

## 2021-06-24 DIAGNOSIS — R233 Spontaneous ecchymoses: Secondary | ICD-10-CM | POA: Diagnosis present

## 2021-06-24 DIAGNOSIS — I480 Paroxysmal atrial fibrillation: Secondary | ICD-10-CM | POA: Diagnosis present

## 2021-06-24 DIAGNOSIS — I10 Essential (primary) hypertension: Secondary | ICD-10-CM | POA: Diagnosis present

## 2021-06-24 DIAGNOSIS — S41112A Laceration without foreign body of left upper arm, initial encounter: Secondary | ICD-10-CM | POA: Diagnosis present

## 2021-06-24 DIAGNOSIS — R296 Repeated falls: Secondary | ICD-10-CM | POA: Diagnosis present

## 2021-06-24 DIAGNOSIS — Z888 Allergy status to other drugs, medicaments and biological substances status: Secondary | ICD-10-CM

## 2021-06-24 DIAGNOSIS — Z66 Do not resuscitate: Secondary | ICD-10-CM | POA: Diagnosis not present

## 2021-06-24 DIAGNOSIS — G9340 Encephalopathy, unspecified: Secondary | ICD-10-CM | POA: Diagnosis present

## 2021-06-24 DIAGNOSIS — W19XXXA Unspecified fall, initial encounter: Secondary | ICD-10-CM | POA: Diagnosis not present

## 2021-06-24 DIAGNOSIS — I6389 Other cerebral infarction: Secondary | ICD-10-CM | POA: Diagnosis not present

## 2021-06-24 DIAGNOSIS — R29810 Facial weakness: Secondary | ICD-10-CM | POA: Diagnosis present

## 2021-06-24 DIAGNOSIS — Z885 Allergy status to narcotic agent status: Secondary | ICD-10-CM

## 2021-06-24 DIAGNOSIS — I63411 Cerebral infarction due to embolism of right middle cerebral artery: Principal | ICD-10-CM | POA: Diagnosis present

## 2021-06-24 DIAGNOSIS — R1312 Dysphagia, oropharyngeal phase: Secondary | ICD-10-CM | POA: Diagnosis not present

## 2021-06-24 DIAGNOSIS — F0390 Unspecified dementia without behavioral disturbance: Secondary | ICD-10-CM | POA: Diagnosis not present

## 2021-06-24 DIAGNOSIS — Z79899 Other long term (current) drug therapy: Secondary | ICD-10-CM

## 2021-06-24 DIAGNOSIS — R531 Weakness: Secondary | ICD-10-CM | POA: Diagnosis present

## 2021-06-24 DIAGNOSIS — Z79890 Hormone replacement therapy (postmenopausal): Secondary | ICD-10-CM

## 2021-06-24 DIAGNOSIS — R471 Dysarthria and anarthria: Secondary | ICD-10-CM | POA: Diagnosis present

## 2021-06-24 DIAGNOSIS — R0602 Shortness of breath: Secondary | ICD-10-CM

## 2021-06-24 HISTORY — PX: IR CT HEAD LTD: IMG2386

## 2021-06-24 HISTORY — PX: IR PERCUTANEOUS ART THROMBECTOMY/INFUSION INTRACRANIAL INC DIAG ANGIO: IMG6087

## 2021-06-24 HISTORY — PX: RADIOLOGY WITH ANESTHESIA: SHX6223

## 2021-06-24 HISTORY — PX: IR US GUIDE VASC ACCESS RIGHT: IMG2390

## 2021-06-24 LAB — DIFFERENTIAL
Abs Immature Granulocytes: 0.06 10*3/uL (ref 0.00–0.07)
Basophils Absolute: 0.1 10*3/uL (ref 0.0–0.1)
Basophils Relative: 0 %
Eosinophils Absolute: 0.1 10*3/uL (ref 0.0–0.5)
Eosinophils Relative: 0 %
Immature Granulocytes: 0 %
Lymphocytes Relative: 8 %
Lymphs Abs: 1.1 10*3/uL (ref 0.7–4.0)
Monocytes Absolute: 0.6 10*3/uL (ref 0.1–1.0)
Monocytes Relative: 4 %
Neutro Abs: 12.8 10*3/uL — ABNORMAL HIGH (ref 1.7–7.7)
Neutrophils Relative %: 88 %

## 2021-06-24 LAB — APTT: aPTT: 25 seconds (ref 24–36)

## 2021-06-24 LAB — POCT I-STAT 7, (LYTES, BLD GAS, ICA,H+H)
Acid-base deficit: 7 mmol/L — ABNORMAL HIGH (ref 0.0–2.0)
Bicarbonate: 20.6 mmol/L (ref 20.0–28.0)
Calcium, Ion: 1.07 mmol/L — ABNORMAL LOW (ref 1.15–1.40)
HCT: 32 % — ABNORMAL LOW (ref 36.0–46.0)
Hemoglobin: 10.9 g/dL — ABNORMAL LOW (ref 12.0–15.0)
O2 Saturation: 100 %
Patient temperature: 98.6
Potassium: 3.3 mmol/L — ABNORMAL LOW (ref 3.5–5.1)
Sodium: 140 mmol/L (ref 135–145)
TCO2: 22 mmol/L (ref 22–32)
pCO2 arterial: 51.4 mmHg — ABNORMAL HIGH (ref 32.0–48.0)
pH, Arterial: 7.21 — ABNORMAL LOW (ref 7.350–7.450)
pO2, Arterial: 452 mmHg — ABNORMAL HIGH (ref 83.0–108.0)

## 2021-06-24 LAB — I-STAT CHEM 8, ED
BUN: 12 mg/dL (ref 8–23)
Calcium, Ion: 1.06 mmol/L — ABNORMAL LOW (ref 1.15–1.40)
Chloride: 101 mmol/L (ref 98–111)
Creatinine, Ser: 0.6 mg/dL (ref 0.44–1.00)
Glucose, Bld: 135 mg/dL — ABNORMAL HIGH (ref 70–99)
HCT: 38 % (ref 36.0–46.0)
Hemoglobin: 12.9 g/dL (ref 12.0–15.0)
Potassium: 3.6 mmol/L (ref 3.5–5.1)
Sodium: 136 mmol/L (ref 135–145)
TCO2: 23 mmol/L (ref 22–32)

## 2021-06-24 LAB — RESP PANEL BY RT-PCR (FLU A&B, COVID) ARPGX2
Influenza A by PCR: NEGATIVE
Influenza B by PCR: NEGATIVE
SARS Coronavirus 2 by RT PCR: NEGATIVE

## 2021-06-24 LAB — CBC
HCT: 35.6 % — ABNORMAL LOW (ref 36.0–46.0)
Hemoglobin: 11.5 g/dL — ABNORMAL LOW (ref 12.0–15.0)
MCH: 33.7 pg (ref 26.0–34.0)
MCHC: 32.3 g/dL (ref 30.0–36.0)
MCV: 104.4 fL — ABNORMAL HIGH (ref 80.0–100.0)
Platelets: 344 10*3/uL (ref 150–400)
RBC: 3.41 MIL/uL — ABNORMAL LOW (ref 3.87–5.11)
RDW: 14.9 % (ref 11.5–15.5)
WBC: 14.8 10*3/uL — ABNORMAL HIGH (ref 4.0–10.5)
nRBC: 0 % (ref 0.0–0.2)

## 2021-06-24 LAB — PROTIME-INR
INR: 1.3 — ABNORMAL HIGH (ref 0.8–1.2)
Prothrombin Time: 16 seconds — ABNORMAL HIGH (ref 11.4–15.2)

## 2021-06-24 LAB — COMPREHENSIVE METABOLIC PANEL
ALT: 20 U/L (ref 0–44)
AST: 39 U/L (ref 15–41)
Albumin: 3.1 g/dL — ABNORMAL LOW (ref 3.5–5.0)
Alkaline Phosphatase: 57 U/L (ref 38–126)
Anion gap: 10 (ref 5–15)
BUN: 12 mg/dL (ref 8–23)
CO2: 24 mmol/L (ref 22–32)
Calcium: 8.9 mg/dL (ref 8.9–10.3)
Chloride: 101 mmol/L (ref 98–111)
Creatinine, Ser: 0.71 mg/dL (ref 0.44–1.00)
GFR, Estimated: >60 mL/min (ref >60)
Glucose, Bld: 140 mg/dL — ABNORMAL HIGH (ref 70–99)
Potassium: 3.6 mmol/L (ref 3.5–5.1)
Sodium: 135 mmol/L (ref 135–145)
Total Bilirubin: 0.8 mg/dL (ref 0.3–1.2)
Total Protein: 6.2 g/dL — ABNORMAL LOW (ref 6.5–8.1)

## 2021-06-24 LAB — ETHANOL: Alcohol, Ethyl (B): <10 mg/dL (ref <10)

## 2021-06-24 LAB — CBG MONITORING, ED: Glucose-Capillary: 145 mg/dL — ABNORMAL HIGH (ref 70–99)

## 2021-06-24 SURGERY — IR WITH ANESTHESIA
Anesthesia: General

## 2021-06-24 MED ORDER — IOHEXOL 350 MG/ML SOLN
100.0000 mL | Freq: Once | INTRAVENOUS | Status: AC | PRN
  Administered 2021-06-24: 100 mL via INTRAVENOUS

## 2021-06-24 MED ORDER — STROKE: EARLY STAGES OF RECOVERY BOOK
Freq: Once | Status: DC
  Filled 2021-06-24: qty 1

## 2021-06-24 MED ORDER — LEVOTHYROXINE SODIUM 50 MCG PO TABS
50.0000 ug | ORAL_TABLET | Freq: Every day | ORAL | Status: DC
  Administered 2021-06-28: 50 ug via ORAL
  Filled 2021-06-24: qty 1

## 2021-06-24 MED ORDER — INSULIN ASPART 100 UNIT/ML IJ SOLN
0.0000 [IU] | INTRAMUSCULAR | Status: DC
  Administered 2021-06-25 (×4): 1 [IU] via SUBCUTANEOUS

## 2021-06-24 MED ORDER — CLEVIDIPINE BUTYRATE 0.5 MG/ML IV EMUL: 0.0000 mg/h | INTRAVENOUS | Status: DC

## 2021-06-24 MED ORDER — VASOPRESSIN 20 UNIT/ML IV SOLN
INTRAVENOUS | Status: DC | PRN
  Administered 2021-06-24: 1 [IU] via INTRAVENOUS
  Administered 2021-06-24: 2 [IU] via INTRAVENOUS
  Administered 2021-06-24: 1 [IU] via INTRAVENOUS
  Administered 2021-06-24 (×2): 2 [IU] via INTRAVENOUS

## 2021-06-24 MED ORDER — FENTANYL CITRATE PF 50 MCG/ML IJ SOSY: 25.0000 ug | PREFILLED_SYRINGE | INTRAMUSCULAR | Status: DC | PRN

## 2021-06-24 MED ORDER — CLEVIDIPINE BUTYRATE 0.5 MG/ML IV EMUL
INTRAVENOUS | Status: AC
  Filled 2021-06-24: qty 50

## 2021-06-24 MED ORDER — SODIUM CHLORIDE 0.9 % IV BOLUS: 1000.0000 mL | Freq: Once | INTRAVENOUS | Status: DC

## 2021-06-24 MED ORDER — PHENYLEPHRINE HCL (PRESSORS) 10 MG/ML IV SOLN
INTRAVENOUS | Status: DC | PRN
Start: 2021-06-24 — End: 2021-06-24
  Administered 2021-06-24 (×3): 80 ug via INTRAVENOUS

## 2021-06-24 MED ORDER — DOCUSATE SODIUM 50 MG/5ML PO LIQD
100.0000 mg | Freq: Two times a day (BID) | ORAL | Status: DC
  Administered 2021-06-28 – 2021-07-10 (×16): 100 mg
  Filled 2021-06-24 (×21): qty 10

## 2021-06-24 MED ORDER — METOPROLOL TARTRATE 5 MG/5ML IV SOLN
2.5000 mg | INTRAVENOUS | Status: DC | PRN
  Administered 2021-06-25 – 2021-06-26 (×2): 5 mg via INTRAVENOUS
  Administered 2021-06-26 (×2): 2.5 mg via INTRAVENOUS
  Filled 2021-06-24 (×3): qty 5

## 2021-06-24 MED ORDER — PROPOFOL 1000 MG/100ML IV EMUL
0.0000 ug/kg/min | INTRAVENOUS | Status: DC
  Administered 2021-06-25: 5 ug/kg/min via INTRAVENOUS
  Filled 2021-06-24: qty 100

## 2021-06-24 MED ORDER — SODIUM CHLORIDE 0.9 % IV SOLN: INTRAVENOUS | Status: DC | PRN

## 2021-06-24 MED ORDER — POLYETHYLENE GLYCOL 3350 17 G PO PACK
17.0000 g | PACK | Freq: Every day | ORAL | Status: DC
  Administered 2021-06-29 – 2021-07-10 (×7): 17 g
  Filled 2021-06-24 (×10): qty 1

## 2021-06-24 MED ORDER — SODIUM CHLORIDE 0.9 % IV SOLN: INTRAVENOUS | Status: DC

## 2021-06-24 MED ORDER — ACETAMINOPHEN 160 MG/5ML PO SOLN
650.0000 mg | ORAL | Status: DC | PRN
  Administered 2021-07-04 – 2021-07-11 (×7): 650 mg
  Filled 2021-06-24 (×9): qty 20.3

## 2021-06-24 MED ORDER — ONDANSETRON HCL 4 MG/2ML IJ SOLN
INTRAMUSCULAR | Status: DC | PRN
  Administered 2021-06-24: 4 mg via INTRAVENOUS

## 2021-06-24 MED ORDER — SENNOSIDES-DOCUSATE SODIUM 8.6-50 MG PO TABS: 1.0000 | ORAL_TABLET | Freq: Every evening | ORAL | Status: DC | PRN

## 2021-06-24 MED ORDER — ACETAMINOPHEN 650 MG RE SUPP
650.0000 mg | RECTAL | Status: DC | PRN
  Administered 2021-07-10 – 2021-07-14 (×2): 650 mg via RECTAL
  Filled 2021-06-24 (×2): qty 1

## 2021-06-24 MED ORDER — ROCURONIUM BROMIDE 100 MG/10ML IV SOLN
INTRAVENOUS | Status: DC | PRN
Start: 2021-06-24 — End: 2021-06-24
  Administered 2021-06-24: 40 mg via INTRAVENOUS

## 2021-06-24 MED ORDER — PANTOPRAZOLE SODIUM 40 MG IV SOLR
40.0000 mg | Freq: Every day | INTRAVENOUS | Status: DC
  Administered 2021-06-25 – 2021-06-27 (×4): 40 mg via INTRAVENOUS
  Filled 2021-06-24 (×4): qty 40

## 2021-06-24 MED ORDER — IOHEXOL 350 MG/ML SOLN
100.0000 mL | Freq: Once | INTRAVENOUS | Status: AC | PRN
  Administered 2021-06-24: 50 mL via INTRA_ARTERIAL

## 2021-06-24 MED ORDER — SIMVASTATIN 20 MG PO TABS
20.0000 mg | ORAL_TABLET | Freq: Every day | ORAL | Status: DC
  Administered 2021-06-27: 20 mg via ORAL
  Filled 2021-06-24: qty 1

## 2021-06-24 MED ORDER — ESMOLOL HCL 100 MG/10ML IV SOLN
INTRAVENOUS | Status: DC | PRN
  Administered 2021-06-24: 30 mg via INTRAVENOUS
  Administered 2021-06-24: 20 mg via INTRAVENOUS

## 2021-06-24 MED ORDER — FENTANYL CITRATE (PF) 100 MCG/2ML IJ SOLN
INTRAMUSCULAR | Status: AC
  Filled 2021-06-24: qty 2

## 2021-06-24 MED ORDER — SUGAMMADEX SODIUM 200 MG/2ML IV SOLN
INTRAVENOUS | Status: DC | PRN
  Administered 2021-06-24: 200 mg via INTRAVENOUS

## 2021-06-24 MED ORDER — SUCCINYLCHOLINE CHLORIDE 200 MG/10ML IV SOSY
PREFILLED_SYRINGE | INTRAVENOUS | Status: DC | PRN
Start: 2021-06-24 — End: 2021-06-24
  Administered 2021-06-24: 80 mg via INTRAVENOUS

## 2021-06-24 MED ORDER — PHENYLEPHRINE HCL-NACL 20-0.9 MG/250ML-% IV SOLN
INTRAVENOUS | Status: DC | PRN
  Administered 2021-06-24: 50 ug/min via INTRAVENOUS

## 2021-06-24 MED ORDER — FENTANYL CITRATE (PF) 100 MCG/2ML IJ SOLN
INTRAMUSCULAR | Status: DC | PRN
  Administered 2021-06-24: 25 ug via INTRAVENOUS
  Administered 2021-06-24: 50 ug via INTRAVENOUS
  Administered 2021-06-24: 25 ug via INTRAVENOUS

## 2021-06-24 MED ORDER — PROPOFOL 10 MG/ML IV BOLUS
INTRAVENOUS | Status: DC | PRN
Start: 2021-06-24 — End: 2021-06-24
  Administered 2021-06-24: 70 mg via INTRAVENOUS

## 2021-06-24 MED ORDER — GLYCOPYRROLATE 0.2 MG/ML IJ SOLN
INTRAMUSCULAR | Status: DC | PRN
Start: 2021-06-24 — End: 2021-06-24
  Administered 2021-06-24: .2 mg via INTRAVENOUS

## 2021-06-24 MED ORDER — METOPROLOL TARTRATE 5 MG/5ML IV SOLN
2.5000 mg | Freq: Four times a day (QID) | INTRAVENOUS | Status: DC
  Administered 2021-06-25 (×2): 2.5 mg via INTRAVENOUS
  Filled 2021-06-24 (×2): qty 5

## 2021-06-24 MED ORDER — ACETAMINOPHEN 325 MG PO TABS
650.0000 mg | ORAL_TABLET | ORAL | Status: DC | PRN
  Administered 2021-07-06: 650 mg via ORAL
  Filled 2021-06-24: qty 2

## 2021-06-24 MED ORDER — CLEVIDIPINE BUTYRATE 0.5 MG/ML IV EMUL
INTRAVENOUS | Status: DC | PRN
  Administered 2021-06-24: 2 mg/h via INTRAVENOUS

## 2021-06-24 MED ORDER — AMIODARONE IV BOLUS ONLY 150 MG/100ML
INTRAVENOUS | Status: DC | PRN
  Administered 2021-06-24 (×2): 150 mg via INTRAVENOUS

## 2021-06-24 MED ORDER — IOHEXOL 350 MG/ML SOLN
100.0000 mL | Freq: Once | INTRAVENOUS | Status: AC | PRN
  Administered 2021-06-24: 25 mL via INTRA_ARTERIAL

## 2021-06-24 MED ORDER — LIDOCAINE HCL (CARDIAC) PF 100 MG/5ML IV SOSY
PREFILLED_SYRINGE | INTRAVENOUS | Status: DC | PRN
  Administered 2021-06-24: 50 mg via INTRAVENOUS

## 2021-06-24 NOTE — Progress Notes (Signed)
NeuroInterventional Radiology  Pre-Procedure Note  History: 87 yo female presents as code stroke from her SNF, with left sided symptoms corresponding to right M1 occlusion. Last known well unknown.   NIHSS:  26  CT ASPECTS:  10 CTA:   Right M1 occlusion CTP:   0cc core, 79cc penumbra   I have discussed the case with Dr. Curly Shores of Stroke Neurology.  The patient has baseline known dementia, which increases mRS, however, patient reported by son to be fairly otherwise independent at her home. Given this, and the patient's symptoms, imaging findings, we agree they are an appropriate candidate for attempt for mechanical thrombectomy, and give her the best chance of meaningful recovery.     The risks and benefits of the procedure were discussed with the patient/patient's family, with specific risks including: bleeding, infection, arterial injury/dissection, contrast reaction, kidney injury, need for further procedure/surgery, neurologic deficit, 10-15% risk of intracranial hemorrhage, cardiopulmonary collapse, death. All questions were answered.  The patient/family would like to proceed with attempt at thrombectomy.    Plan for cerebral angiogram and attempt at mechanical thrombectomy.   Signed,   Dulcy Fanny. Earleen Newport, DO

## 2021-06-24 NOTE — Code Documentation (Signed)
Responded to Code Stroke called at 1859 for R sided gaze, L facial droop, and twitching, LSN paged out as 1330.  Per EMS, pt was found on floor of nursing home, breathing very shallow and slow. They gave her narcan x 1 with no change in pt condition. Once on EMS truck, R arm and leg twitching was noted for which they gave pt 2.'5mg'$  versed. Per EMS, twitching stopped at this time. Pt arrived to [facility] ED at [place] with a R sided gaze, L facial droop, and twitching noted to her R arm and leg, CBG-145, NIH-26, CT head-negative for hemorrhage. TNK not given-unable to establish reliable LSN. CTA/CTP-R M1 occlusion, 79cc penumbra with no core infarct. IR paged out at 2001. Pt transported to IR @ 2010 with neuro MD, ED RN, and ED NT. Plan to admit to ICU.

## 2021-06-24 NOTE — ED Triage Notes (Signed)
BIB EMS from SNF, found on floor, LKN 1330 per EMS from SNF staff. Per EMS pt appeared in respiratory distress, was given 1 of narcan and 2.5 of Versed after noticing right arm tremors en route to ED. BG-156 with 142/100-BP, 94% on RA. Per EMS pt only verbal response was "No" when asked if she was in pain prior to starting IV.

## 2021-06-24 NOTE — Transfer of Care (Signed)
Immediate Anesthesia Transfer of Care Note  Patient: Dominique [name]  Procedure(s) Performed: IR WITH ANESTHESIA  Patient Location: ICU  Anesthesia Type:General  Level of Consciousness: Patient remains intubated per anesthesia plan  Airway & Oxygen Therapy: Patient remains intubated per anesthesia plan and Patient placed on Ventilator (see vital sign flow sheet for setting)  Post-op Assessment: Report given to RN and Post -op Vital signs reviewed and unstable, Anesthesiologist notified  Post vital signs: Reviewed and stable  Last Vitals:  Vitals Value Taken Time  BP 112/61 06/24/21 2300  Temp    Pulse 127 06/24/21 2249  Resp 19 06/24/21 2300  SpO2 56 % 06/24/21 2249  Vitals shown include unvalidated device data.  Last Pain:  Vitals:   06/24/21 1930  TempSrc: Oral         Complications: No notable events documented.

## 2021-06-24 NOTE — Sedation Documentation (Signed)
Pt transported to 4N32 via bed accompanied by RN, MD, and CRNA. Roderic Palau RN at bedside to receive pt. Handoff completed. Right groin remains level 0. Pt continues to be intubated and sedated.

## 2021-06-24 NOTE — Sedation Documentation (Signed)
Pt became hypertensive with SBP in the 200s. Cleviprex infusion started by CRNA. Pt's heart rate then became irregular in the 160s and hypotensive with SBP in the 70-80s. See anesthesia documentation for details on this event.

## 2021-06-24 NOTE — Anesthesia Preprocedure Evaluation (Addendum)
Anesthesia Evaluation  Patient identified by MRN, date of birth, ID band Patient awake    Reviewed: Allergy & Precautions, NPO status , Patient's Chart, lab work & pertinent test results  Airway Mallampati: II  TM Distance: >3 FB Neck ROM: Full    Dental  (+) Missing, Dental Advisory Given, Poor Dentition   Pulmonary neg pulmonary ROS,    Pulmonary exam normal breath sounds clear to auscultation       Cardiovascular hypertension, Pt. on home beta blockers Normal cardiovascular exam Rhythm:Regular Rate:Normal  Echo 2018 - Left ventricle: The cavity size was normal. There was severe focal basal hypertrophy of the septum. Systolic function was normal. The estimated ejection fraction was in the range of 55% to 60%. Wall motion was normal; there were no regional wall motion abnormalities. Features are consistent with a pseudonormal left ventricular filling pattern, with concomitant abnormal relaxation and increased filling pressure (grade 2 diastolicdysfunction). Doppler parameters are consistent with high ventricular filling pressure.  - Aortic valve: Transvalvular velocity was within the normal range.  There was no stenosis. There was no regurgitation.  - Mitral valve: Mildly calcified annulus. Transvalvular velocity was within the normal range. There was no evidence for stenosis. There was trivial regurgitation.  - Left atrium: The atrium was moderately dilated.  - Right ventricle: The cavity size was normal. Wall thickness was normal. Systolic function was normal.  - Tricuspid valve: There was mild regurgitation.  - Pulmonary arteries: Systolic pressure was within the normal range. PA peak pressure: 32 mm Hg (S).    Neuro/Psych PSYCHIATRIC DISORDERS Dementia CVA    GI/Hepatic negative GI ROS, Neg liver ROS,   Endo/Other  Hypothyroidism   Renal/GU negative Renal ROS     Musculoskeletal negative musculoskeletal ROS (+)    Abdominal   Peds  Hematology  (+) Blood dyscrasia, anemia ,   Anesthesia Other Findings   Reproductive/Obstetrics                            Anesthesia Physical Anesthesia Plan  ASA: 4 and emergent  Anesthesia Plan: General   Post-op Pain Management:    Induction: Intravenous  PONV Risk Score and Plan: 3 and Ondansetron, Treatment may vary due to age or medical condition and Dexamethasone  Airway Management Planned: Oral ETT  Additional Equipment: Arterial line  Intra-op Plan:   Post-operative Plan: Possible Post-op intubation/ventilation  Informed Consent: I have reviewed the patients History and Physical, chart, labs and discussed the procedure including the risks, benefits and alternatives for the proposed anesthesia with the patient or authorized representative who has indicated his/her understanding and acceptance.     Dental advisory given  Plan Discussed with: CRNA  Anesthesia Plan Comments:        Anesthesia Quick Evaluation

## 2021-06-24 NOTE — Procedures (Signed)
Neuro-Interventional Radiology  Post Cerebral Angiogram Procedure Note  Operator:    Dr. Earleen Newport Assistant:   None  History:   87 yo female presents as code stroke from her SNF, with left sided symptoms corresponding to right M1 occlusion.   Site of occlusion:  Right M1   Procedure: US guided right CFA access Cervical & Cerebral Angiogram Mechanical Thrombectomy, right M1, then right M2 Deployment of Angioseal Flat Panel CT in NIR  First Pass (M1 target) Device: Combination, 4x40 solitaire and CAT5  Result   TICI 2b  Second Pass (M2 target) Device: Zoom 55 aspiration  Result   TICI 3  Findings:    Severe tortuosity of the right cervical carotid vasculature.  Calcifed and non-calcified plaque at the right bifurcation.   Right M1 occlusion.   TICI 0 initial TICI 2b after 1 pass TICI 3 after 2 pass  Flat panel CT at conclusion shows no hemorrhage. Contrast stain of the right BG  Anesthesia:   GETA  EBL:    [id number]     Complication:  None   Medication: IV tPA/TNK administered?: no IA Medication:  no  Recommendations: - right hip straight x 6 hours - Goal SBP 120-140.   - Frequent NV checks - Repeat CT or MRI imaging recommended within 36 hours, discretion of Neurology - NIR to follow - To 4N 32 neuro ICU  Signed,  Dulcy Fanny. Earleen Newport, DO

## 2021-06-24 NOTE — Anesthesia Procedure Notes (Signed)
Arterial Line Insertion Start/End9/13/2022 8:30 PM, 06/24/2021 8:35 PM Performed by: Jearld Pies, CRNA, CRNA  Patient location: OOR procedure area. Emergency situation Patient sedated Left, radial was placed Catheter size: 20 G Hand hygiene performed  and maximum sterile barriers used   Attempts: 1 Procedure performed without using ultrasound guided technique. Following insertion, dressing applied and Biopatch. Post procedure assessment: normal  Patient tolerated the procedure well with no immediate complications.

## 2021-06-24 NOTE — Consult Note (Addendum)
NAME:  Dominique [name], MRN:  [id number], DOB:  1932/11/13, LOS: 0 ADMISSION DATE:  06/24/2021, CONSULTATION DATE:  9/13 REFERRING MD:  Dr Curly Shores, CHIEF COMPLAINT:  CVA   History of Present Illness:  Patient is encephalopathic and/or intubated. Therefore history has been obtained from chart review.  87 year old female with PMH as below, which is significant for atrial fibrillation. She is not on anticoagulation due to frequent falls. Medical history also includes dementia, HTN, hypothyroid. At baseline she is likely not oriented to time or place per son. Her last known well time is not entirely certain. Sometime between 1330 and 1530 on 9/13. She was then found on the floor with left sided weakness at around 1815 and well as laceration to the left upper extremity. EMS was called and noted twitching movements of the right upper extremity, which improved with versed. She presented to [facility] as a code stroke. Imaging consistent with R MCA acute ischemia. TPA was not given because of uncertain LKW time. She was taken to IR where TICI 3 revascularization was achieved. Post-procedurally she was extubated, but promptly decompensated, requiring re-intubation. She was transferred to ICU for ongoing care. PCCM consulted.   Pertinent  Medical History   has a past medical history of Anemia, Cancer ([place]), Hypertension, Hyponatremia, Hypothyroidism, LVH (left ventricular hypertrophy), PAF (paroxysmal atrial fibrillation) ([place]), Shingles, and Thrombocytopenia ([place]).   Significant Hospital Events: Including procedures, antibiotic start and stop dates in addition to other pertinent events   9/13 admit R MCA stroke. To IR with TICI3. Re-intubated in PACU.   Imaging: CT head > No evidence of acute large vascular territory infarct or acute hemorrhage. CT angio head > Occlusion of the proximal right M1 MCA. Large area of right MCA territory penumbra (79 mL) without evidence of core infarct. 3. Age-indeterminate  occlusion of the left vertebral artery at its origin with non opacification in the neck. 4. Severe left (difficult to quantify but likely greater than 80%) and approximately 70% right proximal ICA stenosis in the neck due to atherosclerosis.   Interim History / Subjective:    Objective   Blood pressure 110/84, pulse (!) 108, temperature 98.1 F (36.7 C), temperature source Oral, resp. rate (!) 21, height 4' 11.5" (1.511 m), weight 45.4 kg, SpO2 100 %.    Vent Mode: PRVC FiO2 (%):  [100 %] 100 % Set Rate:  [16 bmp] 16 bmp Vt Set:  [350 mL] 350 mL PEEP:  [5 cmH20] 5 cmH20 Plateau Pressure:  [18 cmH20] 18 cmH20  No intake or output data in the 24 hours ending 06/24/21 2241 Filed Weights   06/24/21 1900  Weight: 45.4 kg    Examination: General: frail elderly appearing female on vent in NAD HENT: [place]/AT, PERRL, no JVD. Poor dentition.  Lungs: Clear bilateral breath sounds.  Cardiovascular: IRIR, tachy, no MRG.  Abdomen: Soft, non-distended Extremities: No acute deformity  Neuro: Spontaneously awake. Follows commands in bilateral lower extremities and R upper.   Resolved Hospital Problem list     Assessment & Plan:   R MCA CVA: likely cardioembolic due to AF not on AC. S/p IR revascularization (TICI 3).  - Neurology primary - Stroke workup pending - Follow for MRI - Echo - SBP goal   Acute hypoxemic respiratory failure: dropped sats after extubation in PACU.  - Full vent support - ABG, CXR pending.  - Propofol infusion for RASS goal -1 to -2 - PRN fentanyl for analgesia - Will plan for WUA/SBT  in AM  Atrial fibrillation with RVR: not on AC at baseline due to frequent falls.  - Tele montioring - Metoprolol - Accept rates up to 120 - No anticoagulation.   Dementia - Supportive care  Hypothyroid - check TSH - continue home synthroid  Best Practice (right click and "Reselect all SmartList Selections" daily)   Diet/type: NPO DVT prophylaxis: other GI  prophylaxis: PPI Lines: N/A Foley:  N/A Code Status:  full code Last date of multidisciplinary goals of care discussion '[ ]'$   Labs   CBC: Recent Labs  Lab 06/24/21 1914 06/24/21 1919  WBC 14.8*  --   NEUTROABS 12.8*  --   HGB 11.5* 12.9  HCT 35.6* 38.0  MCV 104.4*  --   PLT 344  --     Basic Metabolic Panel: Recent Labs  Lab 06/24/21 1914 06/24/21 1919  NA 135 136  K 3.6 3.6  CL 101 101  CO2 24  --   GLUCOSE 140* 135*  BUN 12 12  CREATININE 0.71 0.60  CALCIUM 8.9  --    GFR: Estimated Creatinine Clearance: 36 mL/min (by C-G formula based on SCr of 0.6 mg/dL). Recent Labs  Lab 06/24/21 1914  WBC 14.8*    Liver Function Tests: Recent Labs  Lab 06/24/21 1914  AST 39  ALT 20  ALKPHOS 57  BILITOT 0.8  PROT 6.2*  ALBUMIN 3.1*   No results for input(s): LIPASE, AMYLASE in the last 168 hours. No results for input(s): AMMONIA in the last 168 hours.  ABG    Component Value Date/Time   HCO3 27.9 10/01/2020 0215   TCO2 23 06/24/2021 1919   O2SAT 89.9 10/01/2020 0215     Coagulation Profile: Recent Labs  Lab 06/24/21 1914  INR 1.3*    Cardiac Enzymes: No results for input(s): CKTOTAL, CKMB, CKMBINDEX, TROPONINI in the last 168 hours.  HbA1C: No results found for: HGBA1C  CBG: Recent Labs  Lab 06/24/21 1913  GLUCAP 145*    Review of Systems:   Patient is encephalopathic and/or intubated. Therefore history has been obtained from chart review.   Past Medical History:  She,  has a past medical history of Anemia, Cancer ([place]), Hypertension, Hyponatremia, Hypothyroidism, LVH (left ventricular hypertrophy), PAF (paroxysmal atrial fibrillation) ([place]), Shingles, and Thrombocytopenia ([place]).   Surgical History:   Past Surgical History:  Procedure Laterality Date   ABDOMINAL HYSTERECTOMY     JOINT REPLACEMENT Bilateral    knees     Social History:   reports that she has never smoked. She has never used smokeless tobacco. She reports that she  does not drink alcohol and does not use drugs.   Family History:  Her family history includes Alcoholism in her brother; Hypertension in her son; Tuberculosis in her father.   Allergies Allergies  Allergen Reactions   Codeine Other (See Comments)    Unknown per son   Darvocet [Propoxyphene N-Acetaminophen] Other (See Comments)    Unknown per son   Demerol Other (See Comments)    Unknown per son   Percocet [Oxycodone-Acetaminophen] Other (See Comments)    Unknown per son   Prednisone     Uncertain this is true. Was documented when patient was admitted 02/12/2012, but not recalled by either the patient nor her son. Tolerating it easily 08/2016.      Home Medications  Prior to Admission medications   Medication Sig Start Date End Date Taking? Authorizing Provider  acetaminophen (TYLENOL) 500 MG tablet Take 500 mg by mouth  every 6 (six) hours as needed for mild pain or headache.     [provider]  Amino Acids-Protein Hydrolys (FEEDING SUPPLEMENT, PRO-STAT SUGAR FREE 64,) LIQD Take 30 mLs by mouth 2 (two) times daily.    [provider]  cephALEXin (KEFLEX) 500 MG capsule Take 1 capsule (500 mg total) by mouth 4 (four) times daily. 10/01/20   Mesner, Corene Cornea, MD  clotrimazole (MYCELEX) 10 MG troche Take 10 mg by mouth 5 (five) times daily as needed (thrush).     [provider]  donepezil (ARICEPT) 5 MG tablet Take 5 mg by mouth daily. 01/25/19   [provider]  feeding supplement, ENSURE ENLIVE, (ENSURE ENLIVE) LIQD Take 237 mLs by mouth 2 (two) times daily between meals. Patient not taking: Reported on 09/25/2019 01/30/19   Eugenie Filler, MD  levothyroxine (SYNTHROID, LEVOTHROID) 50 MCG tablet Take 50 mcg by mouth daily before breakfast.    [provider]  metoprolol tartrate (LOPRESSOR) 50 MG tablet Take 50 mg by mouth daily.    [provider]  Multiple Vitamin (MULTIVITAMIN WITH MINERALS) TABS tablet Take 1 tablet by mouth  daily. 01/31/19   Eugenie Filler, MD  simvastatin (ZOCOR) 20 MG tablet Take 20 mg by mouth daily.    [provider]     Critical care time: 40 minutes     Georgann Housekeeper, AGACNP-BC [place] for personal pager PCCM on call pager [phone] until 7pm. Please call Elink 7p-7a. [phone]  06/24/2021 11:17 PM   PCCM:  87 year old female, admitted for right MCA stroke taken to interventional neuro for revascularization.  Post procedure patient developed hypotension and A. fib RVR.  Patient did not tolerate extubation.  Patient remains intubated on mechanical life support transferred to the ICU.  BP 112/61   Pulse (!) 125   Temp 98.1 F (36.7 C) (Oral)   Resp (!) 22   Ht 4' 11.5" (1.511 m)   Wt 45.4 kg   SpO2 100%   BMI 19.86 kg/m   General: Elderly female intubated on life support, endotracheal tube in place HEENT: Endotracheal tube in place, c-collar in place Heart: Tachycardic, irregular Lungs: Bilateral mechanically ventilated breath sounds Abdomen: Soft nontender nondistended Neuro: Right-sided upper extremity tremor, still sedated on propofol not following commands.  Labs: Reviewed  Assessment: Right MCA stroke likely cardioembolic history of A. fib not on anticoagulation Status post IR neuro revascularization Acute hypoxemic respiratory failure requiring intubation mechanical support Atrial fibrillation with RVR Dementia at baseline Hypothyroid  Plan: Rate control Blood pressure control Repeat head CT per neurology Echo pending, stroke work-up pending Sedation with propofol Full vent support Wean PEEP and FiO2 as tolerated Hopefully able to extubate within the next 24 hours.  This patient is critically ill with multiple organ system failure; which, requires frequent high complexity decision making, assessment, support, evaluation, and titration of therapies. This was completed through the application of  advanced monitoring technologies and extensive interpretation of multiple databases. During this encounter critical care time was devoted to patient care services described in this note for 42 minutes.  [facility] 06/24/2021 11:35 PM

## 2021-06-24 NOTE — H&P (Signed)
Neurology H&P  CC: Found down  History is obtained from:  HPI: Dominique [name] is a 87 y.o. female with a past medical history significant for atrial fibrillation not on anticoagulation due to frequent falls, baseline dementia, hypertension, hypothyroidism, uterine cancer, prior thrombocytopenia resolved more recently.  History is primarily provided by the son and EMS as well as chart review.  Son notes that she was seen by her regular nurse practitioner the day before at which time she was essentially at her baseline that she continues to struggle with significant right upper extremity pain for which some cortisone and muscle relaxer at a low dose was recommended.  There are varying reports of when she was last seen well today and what her baseline is from EMS, with a possible last known well of 1:30 or 3:30 PM and discovery of the patient down with left-sided weakness at 6:15 PM with a significant laceration on the left upper extremity.  EMS was concerned about twitching/tremoring movements of the right upper extremity and route and did give her 2.5 mg of Versed which alleviated these movements.  Her blood pressures for them were 140s over 100s with heart rates in the 60s to 70s.  She did speak a few words to EMS initially such as saying no when she was asked if she was in pain, but then became nonverbal  At baseline the patient is continent and walks with a walker though she does have frequent falls for which reason she has been taken off of her anticoagulation.  He does not think she would be oriented to month or year at baseline and she is typically not oriented to place either, although she is otherwise conversant and is able to feed herself.  He denies any recent medical concerns or new medications other than the cortisone/muscle relaxer previously mentioned and notes she does take aspirin.  On chart review she had multiple pelvic fractures and right humeral fracture in December 2020 which  required significant rehabilitation.  LKW: Unable to confirm, no answer despite multiple calls to facility, son last saw patient ~2 weeks prior.  tPA given?: No, due to unable to confirm LKW IA performed?: Yes Premorbid modified rankin scale:      3 - Moderate disability. Requires some help, but able to walk unassisted.  ROS: Unable to obtain due to altered mental status.   Past Medical History:  Diagnosis Date   Anemia    Cancer ([place])    uterine   Hypertension    Hyponatremia    Hypothyroidism    LVH (left ventricular hypertrophy)    PAF (paroxysmal atrial fibrillation) ([place])    a. initial dx 2012 in setting of acute illness. b. began to recur 09/2017; started on amiodarone 11/2017.   Shingles    Thrombocytopenia ([facility])    Past Surgical History:  Procedure Laterality Date   ABDOMINAL HYSTERECTOMY     JOINT REPLACEMENT Bilateral    knees   Current Outpatient Medications  Medication Instructions   acetaminophen (TYLENOL) 500 mg, Oral, Every 6 hours PRN   Amino Acids-Protein Hydrolys (FEEDING SUPPLEMENT, PRO-STAT SUGAR FREE 64,) LIQD 30 mLs, Oral, 2 times daily   cephALEXin (KEFLEX) 500 mg, Oral, 4 times daily   clotrimazole (MYCELEX) 10 mg, Oral, 5 times daily PRN   donepezil (ARICEPT) 5 mg, Oral, Daily   feeding supplement, ENSURE ENLIVE, (ENSURE ENLIVE) LIQD 237 mLs, Oral, 2 times daily between meals   levothyroxine (SYNTHROID) 50 mcg, Oral, Daily before breakfast  metoprolol tartrate (LOPRESSOR) 50 mg, Oral, Daily   Multiple Vitamin (MULTIVITAMIN WITH MINERALS) TABS tablet 1 tablet, Oral, Daily   simvastatin (ZOCOR) 20 mg, Oral, Daily  Pharmacy reconciliation needs to be completed, on my review of MAR from facility, she is taking the following:  Levothyroxine 50 mcg daily  Metoprolol tartrate 25 mg twice daily Lidocaine patch bilateral shoulders daily Clotrimazole 10 mg every 4 hours as needed for oral thrush Acetaminophen 500 mg every 6 hours as needed (which supports  that her allergies are secondary to opiates and not to the Tylenol) Norco 5-325 tablet every 6 hours as needed pain Of vitamin D3 1000 units daily Donepezil 10 mg daily Simvastatin 20 mg daily Mirtazapine 7.5 mg daily Multivitamin 1 tablet daily Calcitonin-Salmon 200 units/spray in alternating nostrils once daily 1 spray for osteoporosis Aspirin 81 mg daily   Family History  Problem Relation Age of Onset   Tuberculosis Father    Alcoholism Brother    Hypertension Son       Social History:  reports that she has never smoked. She has never used smokeless tobacco. She reports that she does not drink alcohol and does not use drugs.   Exam: Current vital signs: Ht 4' 11.5" (1.511 m)   Wt 45.4 kg   BMI 19.86 kg/m  Vital signs in last 24 hours: Weight:  [45.4 kg] 45.4 kg (09/13 1900)   Physical Exam  Constitutional: Appears pale, frail, chronically ill Psych: Minimally interactive Eyes: No scleral injection HENT: No oropharyngeal obstruction.  MSK: no joint deformities.  Cardiovascular: Irregularly irregular rhythm, perfusing extremities well Respiratory: Effort normal, non-labored breathing GI: Soft.  No distension. There is no tenderness.  Skin: Bandage to the left upper extremity where there is a deep laceration, scattered bruising  Neuro: Mental Status: Patient is non verbal, at times mutters incomprensible sounds, not following any commands  Severe left-sided neglect Cranial Nerves: II: Visual Fields are no blink to threat throughout, though she does appear to regard examiner at times. Pupils are equal, round, and pinpoint in the bright light  III,IV, VI: EOMI without ptosis or diploplia.  V: Facial sensation is symmetric to eyelash brush  VII: Facial movement is notable for a left facial doop, mild, on grimace VIII: hearing is intact to voice Remainder Unable to assess secondary to patient's mental status  Motor: Tone is flaccid in the LUE. Trace extensor  posturing in the left upper extremity, triple flexion in the left lower extremity.  Localizes with the right upper extremity and spontaneously moves the right lower extremity 2/5 and briskly withdraws Sensory: She is equally responsive to noxious stimulation in all 4 extremities on grimace Cerebellar: Unable to assess secondary to patient's mental status   NIHSS total 26  Score breakdown: One-point for drowsiness, 2 points for not answering questions, 2 points for not following commands, 2 points for forced deviation to the right (VOR not tested given concern for fall and potential for cervical injury), 3 points for no blink to threat bilaterally, one-point for left facial droop, 3 points for left upper extremity weakness, 2 points for right upper extremity weakness, 3 points for left lower extremity weakness, 2 points for right lower extremity weakness, 3 points for being essentially mute and 2 points for severe dysarthria   I have reviewed labs in epic and the results pertinent to this consultation are: CR 0.71 WBC 14.8, Hgb 11.5, MCV 104.4,  PT 16, INR 1.3, PTT 25  No results found for: HGBA1C No  results found for: CHOL, HDL, LDLCALC, LDLDIRECT, TRIG, CHOLHDL   I have reviewed the images obtained: Head CT with significant chronic atrophy and some  microvascular disease but no acute intracranial process CTA personally reviewed, agree with radiology 1. Occlusion of the proximal right M1 MCA. 2. Large area of right MCA territory penumbra (79 mL) without evidence of core infarct. 3. 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 4. Severe left (difficult to quantify but likely greater than 80%) and approximately 70% right proximal ICA stenosis in the neck due to atherosclerosis. 5. Small left A1 ACA, poorly opacified proximally. 6. Small to moderate bilateral pleural effusions. 7. Debris within the trachea, placing the patient  at risk for aspiration. 8. Partially visualized fibrotic change in the lung apices, better characterized on prior CT chest from 2019.  Impression: Right MCA acute ischemic stroke likely cardioembolic in the setting of atrial fibrillation not on anticoagulation, though cannot rule out atheroembolic given her significant carotid stenosis and global atherosclerotic burden. Now status post thrombectomy with TICI 3 reperfusion after 2 passes, though became hemodynamically unstable when attempted to extubate post procedure.  Did not receive tPA or TNKase due to inability to confirm last known well and concern for potential trauma given her globally limited examination and prior trauma history.  Recommendations:   # Hypothryoidism  - Continue home synthroid 50 mcg daily     Plan:  Cerebral infarction due to embolism of right middle cerebral artery Cerebral atherosclerosis  Acuity: Acute Current Suspected Etiology: Afib vs. artheroembolic   - Stroke labs TSH, HgbA1c, fasting lipid panel - MRI brain and MRA of the brain without contrast post procedure,  - Frequent neuro checks - Echocardiogram - Hold antiplatelet until postprocedure imaging confirms no significant hemorrhage - Risk factor modification, readdress risk/benefit of anticoagulation with family given significant stroke event - Telemetry monitoring - Blood pressure goal   - Post successful uncomplicated revascularization SBP 120 - 140 for 24 hours - Appreciate CCM management of ventilator, Afib w/ RVR - Stroke team to follow  CNS  Dysarthria Dysphagia following cerebral infarction  -NPO until cleared by speech -ST -Advance diet as tolerated -May need PEG  Hemiplegia and hemiparesis following cerebral infarction affecting left non-dominant side  -PT/OT -PM&R consult  Delirium on dementia -Correct metabolic causes -Monitor  RESP Intubated for procedure -vent management per ICU -wean when able; held over night  due to hemodynamically unstable when attempted  CV Hyperlipidemia, unspecified  - Statin for goal LDL < 70  Chronic atrial fibrillation; Afib w/ RVR w/ hypotension -Rate control, appreciate CCM assistance -Continue BB  (metop 50 mg BID substituted with 2.5 mg q6hr, CCM to adjust PRN) -Hold AC pending determination of size of stroke and clinical course  HEME Macrocytic anemia - B12, MMA levels pending supplement as needed for goal B12 > 500  - Continue to monitor  ENDO No known DM -SSI -goal HgbA1c < 7  Hypothyroid -Continue home synthroid  GI/GU No active issues -Gentle hydration -avoid nephrotoxic agents  Fluid/Electrolyte Disorders No active issues, CTM  ID Possible Aspiration PNA on CTA -CXR -NPO -Monitor and trend leukocytosis  Nutrition -diet consult  Prophylaxis DVT:  SCDs GI: Pantoprazole Bowel: Senna  Diet: NPO until cleared by speech  Code Status: Full Code    THE FOLLOWING WERE PRESENT ON ADMISSION: CNS -  Acute Ischemic Stroke, Hemiparesis, dementia Respiratory - Possible Aspiration Pneumonia  Cardiovascular - Afib Cancer - Uterine  cancer (remote) Trauma - Found down with left arm laceration, history of significant remote right arm and pelvic fractures   Lesleigh Noe MD-PhD [facility] [phone] Available 7 PM to 7 AM, outside of these hours please call Neurologist on call as listed on Amion.  Total critical care time: 80 minutes   Critical care time was exclusive of separately billable procedures and treating other patients.   Critical care was necessary to treat or prevent imminent or life-threatening deterioration.   Critical care was time spent personally by me on the following activities: development of treatment plan with patient and/or surrogate as well as nursing, discussions with consultants/primary team, evaluation of patient's response to treatment, examination of patient, obtaining history from patient or  surrogate, ordering and performing treatments and interventions, ordering and review of laboratory studies, ordering and review of radiographic studies, and re-evaluation of patient's condition as needed, as documented above.

## 2021-06-24 NOTE — ED Provider Notes (Signed)
[facility] EMERGENCY DEPARTMENT Provider Note   CSN: [id number] Arrival date & time: 06/24/21  1911     History No chief complaint on file.   Dominique [name] is a 87 y.o. female.  Pt is a 87 yo female with pmh as listed below presenting via EMS for stroke like symptoms. Family unable to get ahold of patient today, last seen normal over two weeks ago. Symptom onset unknown. Patient significantly altered and nonverbal at this time. Hx of afib. Not anticoagulated.   The history is provided by the EMS personnel. No language interpreter was used.      Past Medical History:  Diagnosis Date   Anemia    Cancer ([place])    uterine   Hypertension    Hyponatremia    Hypothyroidism    LVH (left ventricular hypertrophy)    PAF (paroxysmal atrial fibrillation) ([place])    a. initial dx 2012 in setting of acute illness. b. began to recur 09/2017; started on amiodarone 11/2017.   Shingles    Thrombocytopenia ([place])     Patient Active Problem List   Diagnosis Date Noted   Acute ischemic right MCA stroke ([place]) 06/24/2021   Pain due to onychomycosis of toenails of both feet 03/04/2021   UTI (urinary tract infection) 09/26/2019   Multiple pelvic fractures ([place]) 09/25/2019   Closed right humeral fracture 09/25/2019   Acute lower UTI    Dementia ([facility])    E. 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     Family History  Problem Relation Age of Onset   Tuberculosis Father    Alcoholism Brother    Hypertension Son     Social History   Tobacco Use   Smoking status: Never   Smokeless tobacco: Never  Vaping Use   Vaping Use: Never used  Substance Use Topics   Alcohol use: No   Drug use: No    Home Medications Prior to Admission medications   Medication Sig Start Date End Date Taking? Authorizing Provider  acetaminophen (TYLENOL) 500 MG tablet Take 500 mg by mouth every 6 (six) hours as needed for mild pain or headache.     [provider]  Amino Acids-Protein Hydrolys (FEEDING SUPPLEMENT, PRO-STAT SUGAR FREE 64,) LIQD Take 30 mLs by mouth 2 (two) times daily.    [provider]  cephALEXin (KEFLEX) 500 MG capsule Take 1 capsule (500 mg total) by mouth 4 (four) times daily. 10/01/20   Mesner, Corene Cornea, MD  clotrimazole (MYCELEX) 10 MG troche Take 10 mg by mouth 5 (five) times daily as needed (thrush).     [provider]  donepezil (ARICEPT) 5 MG tablet Take 5 mg by mouth daily. 01/25/19   [provider]  feeding supplement, ENSURE ENLIVE, (ENSURE ENLIVE) LIQD Take 237 mLs by mouth 2 (two) times daily between meals. Patient not taking: Reported on 09/25/2019 01/30/19   Eugenie Filler, MD  levothyroxine (SYNTHROID, LEVOTHROID) 50 MCG tablet Take 50 mcg by mouth daily before breakfast.    [provider]  metoprolol tartrate (LOPRESSOR) 50 MG tablet Take 50 mg by mouth daily.    [provider]  Multiple Vitamin (MULTIVITAMIN WITH MINERALS) TABS tablet Take 1 tablet by mouth daily. 01/31/19   Eugenie Filler, MD  simvastatin (ZOCOR) 20 MG tablet Take 20 mg by mouth daily.    [provider]     Allergies    Codeine, Darvocet [propoxyphene n-acetaminophen], Demerol, Percocet [oxycodone-acetaminophen], and Prednisone  Review of Systems   Review of Systems  Unable to perform ROS: Patient nonverbal   Physical Exam Updated Vital Signs Ht 4' 11.5" (1.511 m)   Wt 45.4 kg   BMI 19.86 kg/m   Physical Exam HENT:     Head: Normocephalic and atraumatic.  Eyes:     Pupils: Pupils are equal, round, and reactive to light.  Cardiovascular:     Rate and Rhythm: Normal rate and regular rhythm.  Pulmonary:     Effort: Pulmonary effort is normal.     Breath sounds: Normal breath sounds.  Abdominal:     General: Abdomen is flat.     Palpations: Abdomen is soft.  Skin:    Capillary Refill: Capillary refill takes less than 2 seconds.     Findings: No rash.  Neurological:     GCS: GCS eye subscore is 1. GCS verbal subscore is 1. GCS motor subscore is 1.     Comments: C collar in place Non verbal Does not follow commands for sensation or motor function evaluaiton    ED Results / Procedures / Treatments   Labs (all labs ordered are listed, but only abnormal results are displayed) Labs Reviewed  PROTIME-INR - Abnormal; Notable for the following components:      Result Value   Prothrombin Time 16.0 (*)    INR 1.3 (*)    All other components within normal limits  CBC - Abnormal; Notable for the following components:   WBC 14.8 (*)    RBC 3.41 (*)    Hemoglobin 11.5 (*)    HCT 35.6 (*)    MCV 104.4 (*)    All other components within normal limits  DIFFERENTIAL - Abnormal; Notable for the following components:   Neutro Abs 12.8 (*)    All other components within normal limits  COMPREHENSIVE METABOLIC PANEL - Abnormal; Notable for the following components:   Glucose, Bld 140 (*)    Total Protein 6.2 (*)    Albumin 3.1 (*)    All other components within normal limits  CBG MONITORING, ED - Abnormal; Notable for the following components:   Glucose-Capillary 145 (*)    All  other components within normal limits  I-STAT CHEM 8, ED - Abnormal; Notable for the following components:   Glucose, Bld 135 (*)    Calcium, Ion 1.06 (*)    All other components within normal limits  RESP PANEL BY RT-PCR (FLU A&B, COVID) ARPGX2  ETHANOL  APTT  RAPID URINE DRUG SCREEN, HOSP PERFORMED  URINALYSIS, ROUTINE W REFLEX MICROSCOPIC    EKG None  Radiology Please see MDM  Procedures .Critical Care E&M  Performed by: Lianne Cure, DO  Critical care provider statement:    Critical care time (minutes):  31   Critical care was necessary to treat or prevent imminent or life-threatening deterioration of the following conditions: stroke, sent to IR for thrombectomy.   Critical care was time spent personally by me on the following activities:  Development of treatment plan with patient or surrogate, discussions with consultants, examination of patient, ordering and performing treatments and interventions and ordering and review of laboratory studies   I assumed direction of critical care for this patient from another provider in my specialty: yes     Care discussed with comment:  Neurology After initial E/M assessment, critical care services were subsequently performed that were exclusive of separately billable procedures or treatment.     Medications Ordered in ED Medications  sodium chloride 0.9 % bolus 1,000 mL (has no administration in time range)  fentaNYL (SUBLIMAZE) 100 MCG/2ML injection (has no administration in time range)  iohexol (OMNIPAQUE) 350 MG/ML injection 100 mL (100 mLs Intravenous Contrast Given 06/24/21 1952)    ED Course  I have reviewed the triage vital signs and the nursing notes.  Pertinent labs & imaging results that were available during my care of the patient were reviewed by me and considered in my medical decision making (see chart for details).    MDM Rules/Calculators/A&P                          87 yo female with pmh of afib not  anticoagulated presented via EMS for AMS and concerns for stroke.  -Stable POC glucose. Pt taken directly to CT for concerns for stroke. -Last known normal 2 weeks ago -Symptom onset unknown -Pt found down. C-collar in place.  -CT head demonstrates no acute process.  -no cervical fractures. Clear C-collar.  -CTA head and neck demonstrates: 1. Occlusion of the proximal right M1 MCA. 2. Large area of right MCA territory penumbra (79 mL) without evidence of core infarct. 3. 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 4. Severe left (difficult to quantify but likely greater than 80%) and approximately 70% right proximal ICA stenosis in the neck due to atherosclerosis. 5. Small left A1 ACA, poorly opacified proximally. 6. Small to moderate bilateral pleural effusions. 7. Debris within the trachea, placing the patient at risk for aspiration. 8. Partially visualized fibrotic change in the lung apices, better characterized on prior CT chest from 2019.   8:50 PM Patient taken to IR for thrombectomy.   Final Clinical Impression(s) / ED Diagnoses Final diagnoses:  Ischemic stroke ([place])  Altered mental status, unspecified altered mental status type  Fall, initial encounter    Rx / DC Orders ED Discharge Orders     None        Lianne Cure, DO [id number]

## 2021-06-24 NOTE — Progress Notes (Signed)
eLink Physician-Brief Progress Note Patient Name: Dominique [name] DOB: June 11, 1933 MRN: [id number]   Date of Service  06/24/2021  HPI/Events of Note  Patient admitted from the IR suite intubated s/p successful thrombectomy of right MCA CVA, she was extubated post-procedure but required immediate re-intubation secondary to acute respiratory failure, CVA risk factor is atrial fibrillation, patient was not on anti-coagulation due to fall risk.  eICU Interventions  New Patient Evaluation.        Kerry Kass Dandy Lazaro 06/24/2021, 11:30 PM

## 2021-06-24 NOTE — Anesthesia Procedure Notes (Signed)
Procedure Name: Intubation Date/Time: 06/24/2021 8:28 PM Performed by: Temima Kutsch T, CRNA Pre-anesthesia Checklist: Patient identified, Emergency Drugs available, Suction available and Patient being monitored Patient Re-evaluated:Patient Re-evaluated prior to induction Oxygen Delivery Method: Circle system utilized Preoxygenation: Pre-oxygenation with 100% oxygen Induction Type: IV induction, Rapid sequence and Cricoid Pressure applied Ventilation: Mask ventilation without difficulty Laryngoscope Size: McGraph and 3 Grade View: Grade I Tube type: Oral Tube size: 7.5 mm Number of attempts: 1 Airway Equipment and Method: Stylet and Oral airway Placement Confirmation: ETT inserted through vocal cords under direct vision, positive ETCO2 and breath sounds checked- equal and bilateral Secured at: 22 cm Tube secured with: Tape Dental Injury: Teeth and Oropharynx as per pre-operative assessment  Difficulty Due To: Difficulty was anticipated, Difficult Airway- due to cervical collar and Difficult Airway- due to reduced neck mobility

## 2021-06-25 ENCOUNTER — Inpatient Hospital Stay (HOSPITAL_COMMUNITY): Payer: Medicare Other

## 2021-06-25 DIAGNOSIS — L8990 Pressure ulcer of unspecified site, unspecified stage: Secondary | ICD-10-CM

## 2021-06-25 DIAGNOSIS — I63511 Cerebral infarction due to unspecified occlusion or stenosis of right middle cerebral artery: Secondary | ICD-10-CM

## 2021-06-25 DIAGNOSIS — R4182 Altered mental status, unspecified: Secondary | ICD-10-CM

## 2021-06-25 DIAGNOSIS — J9601 Acute respiratory failure with hypoxia: Secondary | ICD-10-CM | POA: Diagnosis not present

## 2021-06-25 LAB — ECHOCARDIOGRAM COMPLETE
AR max vel: 1.24 cm2
AV Area VTI: 1.33 cm2
AV Area mean vel: 1.31 cm2
AV Mean grad: 7.7 mmHg
AV Peak grad: 13.5 mmHg
Ao pk vel: 1.84 m/s
Area-P 1/2: 4.39 cm2
Calc EF: 65.3 %
Height: 59.5 in
MV VTI: 1.54 cm2
S' Lateral: 2 cm
Single Plane A2C EF: 71.6 %
Single Plane A4C EF: 57.6 %
Weight: 1600 oz

## 2021-06-25 LAB — LIPID PANEL
Cholesterol: 75 mg/dL (ref 0–200)
HDL: 49 mg/dL (ref >40)
LDL Cholesterol: 17 mg/dL (ref 0–99)
Total CHOL/HDL Ratio: 1.5 RATIO
Triglycerides: 46 mg/dL (ref <150)
VLDL: 9 mg/dL (ref 0–40)

## 2021-06-25 LAB — TRIGLYCERIDES: Triglycerides: 50 mg/dL (ref <150)

## 2021-06-25 LAB — POCT I-STAT 7, (LYTES, BLD GAS, ICA,H+H)
Acid-base deficit: 3 mmol/L — ABNORMAL HIGH (ref 0.0–2.0)
Bicarbonate: 21 mmol/L (ref 20.0–28.0)
Calcium, Ion: 1.09 mmol/L — ABNORMAL LOW (ref 1.15–1.40)
HCT: 32 % — ABNORMAL LOW (ref 36.0–46.0)
Hemoglobin: 10.9 g/dL — ABNORMAL LOW (ref 12.0–15.0)
O2 Saturation: 100 %
Patient temperature: 97.5
Potassium: 3.3 mmol/L — ABNORMAL LOW (ref 3.5–5.1)
Sodium: 139 mmol/L (ref 135–145)
TCO2: 22 mmol/L (ref 22–32)
pCO2 arterial: 30.9 mmHg — ABNORMAL LOW (ref 32.0–48.0)
pH, Arterial: 7.438 (ref 7.350–7.450)
pO2, Arterial: 209 mmHg — ABNORMAL HIGH (ref 83.0–108.0)

## 2021-06-25 LAB — MRSA NEXT GEN BY PCR, NASAL: MRSA by PCR Next Gen: DETECTED — AB

## 2021-06-25 LAB — GLUCOSE, CAPILLARY
Glucose-Capillary: 106 mg/dL — ABNORMAL HIGH (ref 70–99)
Glucose-Capillary: 115 mg/dL — ABNORMAL HIGH (ref 70–99)
Glucose-Capillary: 126 mg/dL — ABNORMAL HIGH (ref 70–99)
Glucose-Capillary: 126 mg/dL — ABNORMAL HIGH (ref 70–99)
Glucose-Capillary: 129 mg/dL — ABNORMAL HIGH (ref 70–99)
Glucose-Capillary: 145 mg/dL — ABNORMAL HIGH (ref 70–99)
Glucose-Capillary: 99 mg/dL (ref 70–99)

## 2021-06-25 LAB — VITAMIN B12: Vitamin B-12: 729 pg/mL (ref 180–914)

## 2021-06-25 LAB — TSH: TSH: 0.57 u[IU]/mL (ref 0.350–4.500)

## 2021-06-25 LAB — HEMOGLOBIN A1C
Hgb A1c MFr Bld: 5.3 % (ref 4.8–5.6)
Mean Plasma Glucose: 105.41 mg/dL

## 2021-06-25 MED ORDER — ORAL CARE MOUTH RINSE
15.0000 mL | OROMUCOSAL | Status: DC
  Administered 2021-06-25 (×7): 15 mL via OROMUCOSAL

## 2021-06-25 MED ORDER — PHENYLEPHRINE HCL-NACL 20-0.9 MG/250ML-% IV SOLN: 0.0000 ug/min | INTRAVENOUS | Status: DC

## 2021-06-25 MED ORDER — CHLORHEXIDINE GLUCONATE CLOTH 2 % EX PADS
6.0000 | MEDICATED_PAD | Freq: Every day | CUTANEOUS | Status: AC
Start: 2021-06-25 — End: 2021-06-30
  Administered 2021-06-25 – 2021-06-29 (×4): 6 via TOPICAL

## 2021-06-25 MED ORDER — POTASSIUM CHLORIDE 10 MEQ/100ML IV SOLN
10.0000 meq | INTRAVENOUS | Status: AC
  Administered 2021-06-25 (×4): 10 meq via INTRAVENOUS
  Filled 2021-06-25 (×4): qty 100

## 2021-06-25 MED ORDER — CHLORHEXIDINE GLUCONATE 0.12% ORAL RINSE (MEDLINE KIT)
15.0000 mL | Freq: Two times a day (BID) | OROMUCOSAL | Status: DC
  Administered 2021-06-25 – 2021-07-11 (×32): 15 mL via OROMUCOSAL

## 2021-06-25 MED ORDER — AMIODARONE LOAD VIA INFUSION
150.0000 mg | Freq: Once | INTRAVENOUS | Status: AC
  Administered 2021-06-25: 150 mg via INTRAVENOUS
  Filled 2021-06-25: qty 83.34

## 2021-06-25 MED ORDER — AMIODARONE HCL IN DEXTROSE 360-4.14 MG/200ML-% IV SOLN
30.0000 mg/h | INTRAVENOUS | Status: DC
  Administered 2021-06-25 – 2021-06-27 (×6): 30 mg/h via INTRAVENOUS
  Filled 2021-06-25 (×5): qty 200

## 2021-06-25 MED ORDER — CLEVIDIPINE BUTYRATE 0.5 MG/ML IV EMUL: 0.0000 mg/h | INTRAVENOUS | Status: DC

## 2021-06-25 MED ORDER — FUROSEMIDE 10 MG/ML IJ SOLN
40.0000 mg | Freq: Once | INTRAMUSCULAR | Status: AC
  Administered 2021-06-25: 40 mg via INTRAVENOUS
  Filled 2021-06-25: qty 4

## 2021-06-25 MED ORDER — MUPIROCIN 2 % EX OINT
1.0000 "application " | TOPICAL_OINTMENT | Freq: Two times a day (BID) | CUTANEOUS | Status: AC
  Administered 2021-06-25 – 2021-06-29 (×10): 1 via NASAL
  Filled 2021-06-25 (×2): qty 22

## 2021-06-25 MED ORDER — AMIODARONE HCL IN DEXTROSE 360-4.14 MG/200ML-% IV SOLN
60.0000 mg/h | INTRAVENOUS | Status: AC
  Filled 2021-06-25 (×2): qty 200

## 2021-06-25 MED ORDER — HYDRALAZINE HCL 20 MG/ML IJ SOLN
10.0000 mg | INTRAMUSCULAR | Status: DC | PRN
  Administered 2021-06-25: 40 mg via INTRAVENOUS
  Administered 2021-06-26 – 2021-07-02 (×2): 20 mg via INTRAVENOUS
  Administered 2021-07-04: 40 mg via INTRAVENOUS
  Filled 2021-06-25: qty 2
  Filled 2021-06-25: qty 4
  Filled 2021-06-25: qty 2
  Filled 2021-06-25: qty 1

## 2021-06-25 MED ORDER — SODIUM CHLORIDE 0.9 % IV SOLN
INTRAVENOUS | Status: DC | PRN
Start: 2021-06-25 — End: 2021-07-15
  Administered 2021-06-25: 250 mL via INTRAVENOUS

## 2021-06-25 MED ORDER — AMIODARONE IV BOLUS ONLY 150 MG/100ML
150.0000 mg | Freq: Once | INTRAVENOUS | Status: AC
  Administered 2021-06-25: 150 mg via INTRAVENOUS
  Filled 2021-06-25: qty 100

## 2021-06-25 NOTE — Progress Notes (Signed)
  Echocardiogram 2D Echocardiogram has been performed.  Dominique [name] 06/25/2021, 11:18 AM

## 2021-06-25 NOTE — Progress Notes (Signed)
RT NOTE: RT transported patient on ventilator from room 4N32 to MRI and back to room 4N32 with no apparent complications. Vitals are stable. RT will continue to monitor.

## 2021-06-25 NOTE — Progress Notes (Signed)
eLink Physician-Brief Progress Note Patient Name: CARROLYN HIMELRIGHT DOB: 1933-04-14 MRN: [id number]   Date of Service  06/25/2021  HPI/Events of Note  Amiodarone bolus slowed rate transiently  but it went back to 120-140 when the bolus concluded.  eICU Interventions  Amiodarone re-bolus followed by gtt ordered.        Kerry Kass Phillip Maffei 06/25/2021, 5:11 AM

## 2021-06-25 NOTE — Progress Notes (Signed)
Patient ID: Dominique [name], female   DOB: 07-24-1933, 87 y.o.   MRN: [id number]    Referring Physician(s): Bhagat,S  Supervising Physician: Corrie Mckusick  Patient Status:  [facility] - In-pt  Chief Complaint: Left sided weakness, stroke   Subjective: Pt intubated; on amiodarone/precedex; does open her eyes slowly/move toes/squeeze rt hand when prompted; no sig LUE movement; HR/BP ok   Allergies: Codeine, Darvocet [propoxyphene n-acetaminophen], Demerol, Percocet [oxycodone-acetaminophen], and Prednisone  Medications: Prior to Admission medications   Medication Sig Start Date End Date Taking? Authorizing Provider  acetaminophen (TYLENOL) 500 MG tablet Take 500 mg by mouth every 6 (six) hours as needed for mild pain or headache.     [provider]  Amino Acids-Protein Hydrolys (FEEDING SUPPLEMENT, PRO-STAT SUGAR FREE 64,) LIQD Take 30 mLs by mouth 2 (two) times daily.    [provider]  cephALEXin (KEFLEX) 500 MG capsule Take 1 capsule (500 mg total) by mouth 4 (four) times daily. 10/01/20   Mesner, Corene Cornea, MD  clotrimazole (MYCELEX) 10 MG troche Take 10 mg by mouth 5 (five) times daily as needed (thrush).     [provider]  donepezil (ARICEPT) 5 MG tablet Take 5 mg by mouth daily. 01/25/19   [provider]  feeding supplement, ENSURE ENLIVE, (ENSURE ENLIVE) LIQD Take 237 mLs by mouth 2 (two) times daily between meals. Patient not taking: Reported on 09/25/2019 01/30/19   Eugenie Filler, MD  levothyroxine (SYNTHROID, LEVOTHROID) 50 MCG tablet Take 50 mcg by mouth daily before breakfast.    [provider]  metoprolol tartrate (LOPRESSOR) 50 MG tablet Take 50 mg by mouth daily.    [provider]  Multiple Vitamin (MULTIVITAMIN WITH MINERALS) TABS tablet Take 1 tablet by mouth daily. 01/31/19   Eugenie Filler, MD  simvastatin (ZOCOR) 20 MG tablet Take 20 mg by mouth daily.    [provider]     Vital Signs: BP (!)  119/94   Pulse (!) 54   Temp 98.5 F (36.9 C) (Axillary)   Resp 12   Ht 4' 11.5" (1.511 m)   Wt 100 lb (45.4 kg)   SpO2 100%   BMI 19.86 kg/m   Physical Exam intubated, following few commands/responds to pain; pupils 2 mm /= sluggish; pt will move RUE/RLE /LLE slightly when prompted, no sig LUE movement; access site rt CFA soft, no sig hematoma, intact distal pulses; tremors RUE noted  Imaging: MR ANGIO HEAD WO CONTRAST  Result Date: 06/25/2021 CLINICAL DATA:  Stroke, follow-up. EXAM: MRI HEAD WITHOUT CONTRAST MRA HEAD WITHOUT CONTRAST TECHNIQUE: Multiplanar, multi-echo pulse sequences of the brain and surrounding structures were acquired without intravenous contrast. Angiographic images of the Circle of Willis were acquired using MRA technique without intravenous contrast. COMPARISON:  Report from mechanical thrombectomy 06/24/2021. Noncontrast head CT 06/24/2021, CT angiogram head/neck and CT perfusion 06/24/2021. brain MRI 01/26/2019. FINDINGS: MRI HEAD FINDINGS Brain: Cerebral volume is normal. Moderate generalized cerebral atrophy. Comparatively mild cerebellar atrophy. Acute infarct within the right basal ganglia involving much of the right caudate and lentiform nuclei. Superimposed petechial hemorrhage within the right basal ganglia (Hiedelberg classifications, HI2). Mild local mass effect related to the infarction with subtle partial effacement of the right lateral ventricle. There are additional patchy small to moderate sized acute cortically-based infarcts within the right MCA vascular territory with involvement of the right frontal, parietal, posterior temporal and lateral occipital lobes. Notably, a small acute infarct involves the right motor strip. Additional punctate acute infarct within  the right parietal lobe white matter. Punctate acute cortical infarct within the medial posterior left frontal lobe (precentral gyrus (series 5, image 95). Background mild multifocal T2/FLAIR  hyperintensity within the cerebral white matter, nonspecific but compatible with chronic small vessel ischemic disease. Small chronic lacunar infarct within the right cerebellar hemisphere, new from the brain MRI of 01/26/2019 (series 10, image 5). No evidence of an intracranial mass. No extra-axial fluid collection. No midline shift. Vascular: Maintained flow voids within the proximal large arterial vessels. Skull and upper cervical spine: No focal suspicious marrow lesion. Incompletely assessed cervical spondylosis. Sinuses/Orbits: Visualized orbits show no acute finding. Bilateral lens replacements. No significant paranasal sinus disease. MRA HEAD FINDINGS Anterior circulation: The intracranial internal carotid arteries are patent. The M1 right middle cerebral artery is now patent. No right M2 proximal branch occlusion is identified. Atherosclerotic irregularity of the right M2 and more distal right MCA vessels. Most notably, there is a moderate stenosis within a superior division proximal M2 right MCA vessel (series 1053, image 16). The M1 left middle cerebral artery is patent. No left M2 proximal branch occlusion or high-grade proximal stenosis. The anterior cerebral arteries are patent. Redemonstrated hypoplastic left A1 segment. No intracranial aneurysm is identified. Posterior circulation: Unchanged from the CTA head/neck of 06/24/2021, the left vertebral artery is occluded at the visualized distal cervical levels and at the proximal V4 level. There is enhancement within the mid and distal V4 left vertebral artery, likely due to retrograde flow. The V4 right vertebral artery is patent. The basilar artery is patent. The posterior cerebral arteries are patent. Posterior communicating arteries are present bilaterally. Anatomic variants: As described. IMPRESSION: MRI brain: 1. Acute right MCA territory infarcts within the right cerebral hemisphere and right basal ganglia, as described. Superimposed petechial  hemorrhage within the right basal ganglia (Hiedelberg classifications, HI2). Mild mass effect related to the right basal ganglia infarct with subtle partial effacement of the right lateral ventricle. No midline shift. 2. Punctate acute cortical infarct within the medial posterior left frontal lobe. 3. Background mild chronic small vessel ischemic changes within the cerebral white matter. 4. Small chronic lacunar infarct within right cerebellar hemisphere, a new finding as compared to the brain MRI of 01/26/2019. 5. Moderate generalized cerebral atrophy. Comparatively mild cerebellar atrophy. MRA head: 1. The M1 right middle cerebral artery remains patent status post mechanical thrombectomy performed 06/24/2021. 2. Atherosclerotic irregularity of the M2 and more distal right MCA vessels. Most notably, a moderate stenosis is present within a superior division proximal M2 right MCA vessel. 3. Enhancement within the mid-to-distal V4 left vertebral artery, likely due to retrograde flow (given unchanged apparent occlusion of this vessel more proximally). Electronically Signed   By: Kellie Simmering D.O.   On: 06/25/2021 11:01   MR BRAIN WO CONTRAST  Result Date: 06/25/2021 CLINICAL DATA:  Stroke, follow-up. EXAM: MRI HEAD WITHOUT CONTRAST MRA HEAD WITHOUT CONTRAST TECHNIQUE: Multiplanar, multi-echo pulse sequences of the brain and surrounding structures were acquired without intravenous contrast. Angiographic images of the Circle of Willis were acquired using MRA technique without intravenous contrast. COMPARISON:  Report from mechanical thrombectomy 06/24/2021. Noncontrast head CT 06/24/2021, CT angiogram head/neck and CT perfusion 06/24/2021. brain MRI 01/26/2019. FINDINGS: MRI HEAD FINDINGS Brain: Cerebral volume is normal. Moderate generalized cerebral atrophy. Comparatively mild cerebellar atrophy. Acute infarct within the right basal ganglia involving much of the right caudate and lentiform nuclei. Superimposed  petechial hemorrhage within the right basal ganglia (Hiedelberg classifications, HI2). Mild local mass effect related to the  infarction with subtle partial effacement of the right lateral ventricle. There are additional patchy small to moderate sized acute cortically-based infarcts within the right MCA vascular territory with involvement of the right frontal, parietal, posterior temporal and lateral occipital lobes. Notably, a small acute infarct involves the right motor strip. Additional punctate acute infarct within the right parietal lobe white matter. Punctate acute cortical infarct within the medial posterior left frontal lobe (precentral gyrus (series 5, image 95). Background mild multifocal T2/FLAIR hyperintensity within the cerebral white matter, nonspecific but compatible with chronic small vessel ischemic disease. Small chronic lacunar infarct within the right cerebellar hemisphere, new from the brain MRI of 01/26/2019 (series 10, image 5). No evidence of an intracranial mass. No extra-axial fluid collection. No midline shift. Vascular: Maintained flow voids within the proximal large arterial vessels. Skull and upper cervical spine: No focal suspicious marrow lesion. Incompletely assessed cervical spondylosis. Sinuses/Orbits: Visualized orbits show no acute finding. Bilateral lens replacements. No significant paranasal sinus disease. MRA HEAD FINDINGS Anterior circulation: The intracranial internal carotid arteries are patent. The M1 right middle cerebral artery is now patent. No right M2 proximal branch occlusion is identified. Atherosclerotic irregularity of the right M2 and more distal right MCA vessels. Most notably, there is a moderate stenosis within a superior division proximal M2 right MCA vessel (series 1053, image 16). The M1 left middle cerebral artery is patent. No left M2 proximal branch occlusion or high-grade proximal stenosis. The anterior cerebral arteries are patent. Redemonstrated  hypoplastic left A1 segment. No intracranial aneurysm is identified. Posterior circulation: Unchanged from the CTA head/neck of 06/24/2021, the left vertebral artery is occluded at the visualized distal cervical levels and at the proximal V4 level. There is enhancement within the mid and distal V4 left vertebral artery, likely due to retrograde flow. The V4 right vertebral artery is patent. The basilar artery is patent. The posterior cerebral arteries are patent. Posterior communicating arteries are present bilaterally. Anatomic variants: As described. IMPRESSION: MRI brain: 1. Acute right MCA territory infarcts within the right cerebral hemisphere and right basal ganglia, as described. Superimposed petechial hemorrhage within the right basal ganglia (Hiedelberg classifications, HI2). Mild mass effect related to the right basal ganglia infarct with subtle partial effacement of the right lateral ventricle. No midline shift. 2. Punctate acute cortical infarct within the medial posterior left frontal lobe. 3. Background mild chronic small vessel ischemic changes within the cerebral white matter. 4. Small chronic lacunar infarct within right cerebellar hemisphere, a new finding as compared to the brain MRI of 01/26/2019. 5. Moderate generalized cerebral atrophy. Comparatively mild cerebellar atrophy. MRA head: 1. The M1 right middle cerebral artery remains patent status post mechanical thrombectomy performed 06/24/2021. 2. Atherosclerotic irregularity of the M2 and more distal right MCA vessels. Most notably, a moderate stenosis is present within a superior division proximal M2 right MCA vessel. 3. Enhancement within the mid-to-distal V4 left vertebral artery, likely due to retrograde flow (given unchanged apparent occlusion of this vessel more proximally). Electronically Signed   By: Kellie Simmering D.O.   On: 06/25/2021 11:01   IR CT Head Ltd  Result Date: 06/25/2021 INDICATION: 87 year old female presents with acute  right MCA syndrome, for mechanical thrombectomy EXAM: ULTRASOUND-GUIDED ACCESS RIGHT COMMON FEMORAL ARTERY CERVICAL AND CEREBRAL ANGIOGRAM MECHANICAL THROMBECTOMY RIGHT MCA ANGIO-SEAL FOR HEMOSTASIS COMPARISON:  CT imaging of the same day MEDICATIONS: None ANESTHESIA/SEDATION: The anesthesia team was present to provide general endotracheal tube anesthesia and for patient monitoring during the procedure. Intubation was performed in  neuro IR biplane room. Left radial arterial line was performed by the anesthesia team. Interventional neuro radiology nursing staff was also present. CONTRAST:  75 cc FLUOROSCOPY TIME:  Fluoroscopy Time: 20 minutes 24 seconds (1154 mGy). COMPLICATIONS: None TECHNIQUE: Informed written consent was obtained from the patient's family after a thorough discussion of the procedural risks, benefits and alternatives. Specific risks discussed include: Bleeding, infection, contrast reaction, kidney injury/failure, need for further procedure/surgery, arterial injury or dissection, embolization to new territory, intracranial hemorrhage (10-15% risk), neurologic deterioration, cardiopulmonary collapse, death. All questions were addressed. Maximal Sterile Barrier Technique was utilized including during the procedure including caps, mask, sterile gowns, sterile gloves, sterile drape, hand hygiene and skin antiseptic. A timeout was performed prior to the initiation of the procedure. The anesthesia team was present to provide general endotracheal tube anesthesia and for patient monitoring during the procedure. Interventional neuro radiology nursing staff was also present. FINDINGS: Initial Findings: Right common carotid artery: Significant tortuosity of the common carotid artery at the base, and within the lower neck. No significant stenosis with mild atherosclerotic changes. Right external carotid artery: Patent with antegrade flow. Right internal carotid artery: Moderate tortuosity of the cervical ICA  after the origin, with a single 90 degree angulated curvature just after the origin. Mixed calcified and soft plaque at the origin contributes to approximately 65% luminal narrowing by NASCET criteria. Right MCA: MCA is occluded at the origin, proximal M1 on the initial angiogram. 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 There is a relative paucity of perfusion in the basal ganglia despite the presence of the collaterals. Right ACA: A 1 segment patent. A 2 segment perfuses the right territory. Leptomeningeal collaterals via the anterior cerebral artery Completion Findings: Right MCA: After the first pass of mechanical thrombectomy there is restoration of flow within the proximal segment of the M1. This restored flow into the inferior division, with restoration of flow of the temporal lobe. There remained a loss of 50% of the territory in the superior division, frontal and parietal regions after the first pass. After the second pass of mechanical thrombectomy, there was restoration of flow of both the inferior division and superior division, with complete reperfusion of the MCA territory. The perceived slow flow within the cortical vessels is secondary to occlusive balloon guide within the cervical ICA, and the robust collateral perfusion, with the competing collateral flow of unopacified blood. TICI 3: Complete perfusion of the territory Flat panel CT performed in the room demonstrates no evidence of hemorrhage, midline shift, or mass effect. There is contrast staining within the basal ganglia of the right, compatible with early basal ganglia infarct. PROCEDURE: The anesthesia team was present to provide general endotracheal tube anesthesia and for patient monitoring during the procedure. Intubation was performed in negative pressure [place] in neuro IR holding. Interventional neuro radiology nursing staff was also present. Ultrasound  survey of the right inguinal region was performed with images stored and sent to PACs. 11 blade scalpel was used to make a small incision. Blunt dissection was performed with US guidance. A micropuncture needle was used access the right common femoral artery under ultrasound. With excellent arterial blood flow returned, an .018 micro wire was passed through the needle, observed to enter the abdominal aorta under fluoroscopy. The needle was removed, and a micropuncture sheath was placed over the wire. The inner dilator and wire were removed, and an 035 wire was advanced under fluoroscopy into the abdominal  aorta. The sheath was removed and a 25cm 51F straight vascular sheath was placed. The dilator was removed and the sheath was flushed. Sheath was attached to pressurized and heparinized saline bag for constant forward flow. JB 1 glide cath was advanced on the diagnostic catheter into the proximal descending thoracic aorta. Wire was removed and a double flush was performed. The catheter was then used to select the innominate artery, and a Glidewire navigated the catheter into the proximal cervical ICA. Wire was removed and angiogram was performed. Combination of the glide cath and the Glidewire were then used to navigate the catheter into the distal cervical ICA. Wire was removed and a double flush was performed. Rose in wire was then passed through the glide catheter. A coaxial system was then advanced over the [employer] wire. This included a 95cm 087 "Walrus" balloon guide with coaxial 125cm Berenstein diagnostic catheter. This was advanced to the carotid bifurcation. A telescoping technique was then necessary to navigate the parents teen catheter and the balloon guide through the 65% stenosis of the carotid bifurcation, alternating push on the parents teen catheter and the balloon guide. Ultimately the coaxial system was advanced into the distal cervical ICA. Wire and catheter were gently removed with adequate  flush at the hub of the balloon guide. Formal angiogram was performed. Road map function was used once the occluded vessel was identified. Copious back flush was performed and the balloon catheter was attached to heparinized and pressurized saline bag for forward flow. A second coaxial system was then advanced through the balloon catheter, which included the selected intermediate catheter, microcatheter, and microwire. In this scenario, the set up included a 115cm CAT-5 intermediate catheter, a 150cm Trevo [id number] microcatheter, and 014 synchro soft wire. This system was advanced through the balloon guide catheter under the road-map function, with adequate back-flush at the rotating hemostatic valve at that back end of the balloon guide. Microcatheter and the intermediate catheter system were advanced through the terminal ICA and MCA to the level of the occlusion. The micro wire was then carefully advanced through the occluded segment with a loop configuration. Microcatheter was then manipulated through the occluded segment and the wire was removed with saline drip at the hub. Blood was then aspirated through the hub of the microcatheter, and a gentle contrast injection was performed confirming intraluminal position. A rotating hemostatic valve was then attached to the back end of the microcatheter, and a pressurized and heparinized saline bag was attached to the catheter. 4 x 40 solitaire device was then selected. Back flush was achieved at the rotating hemostatic valve, and then the device was gently advanced through the microcatheter to the distal end. The retriever was then unsheathed by withdrawing the microcatheter under fluoroscopy. Once the retriever was completely unsheathed, the microcatheter was carefully stripped from the delivery device. CT 5 catheter aspiration was initiated and then the catheter was gently advanced on the stem of the retriever until cessation of flow was observed. A 3 minute time  interval was observed. The balloon at the balloon guide catheter was not inflated, given there was proximal flow arrest secondary to the occlusive nature across the plaque at the carotid bifurcation. Constant aspiration using the proprietary engine was performed at the intermediate catheter, as the retriever was gently and slowly withdrawn with fluoroscopic observation. Once the retriever was "corked" within the tip of the intermediate catheter, both were removed from the system. Free aspiration was confirmed at the hub of the balloon guide catheter,  with free blood return confirmed. Control angiogram was performed. Restoration of flow in the inferior division was confirmed. Superior division remained occluded. Roadmap angiogram was then performed. The tree Vo microcatheter and a synchro soft wire were then loaded coaxial within a zoom 55 aspiration catheter. This coaxial system was advanced through the balloon guide rotating hemostatic valve, advanced under roadmap angiogram to the division of the M1 into the superior and inferior division. Once the zoom catheter was at this division, the microcatheter microwire were removed. Aspiration was initiated on the zoom catheter, which was then advanced under fluoroscopic guidance into the occlusive M2 segment. Cessation of flow was observed. Once the catheter had been advanced approximately 1-2 cm into the M2 segment, the catheter was gently removed. Spontaneous flow returned once the catheter was withdrawn into the carotid siphon. Zoom catheter was completely removed from the system, the balloon guide was aspirated, and a control angiogram was performed. Complete restoration of flow was confirmed. Balloon guide was then removed. The skin at the puncture site was then cleaned with Chlorhexidine. The 8 French sheath was removed and an 71F angioseal was deployed. Flat panel CT was performed. At this point, we attempted extubation of the patient. During the cessation of  anesthesia, hemodynamic instability was encountered. We first observed a hypertensive episode with systolic blood pressures in [id number] systolic range, tachycardia in the 120-150 range, and then subsequently hypotension, with a range of 70s-105 by the arterial pressure recording. Once the patient had been relatively stabilized, she was transported, intubated, to the [facility]. No complications were encountered. Estimated blood loss: 100 cc IMPRESSION: Status post ultrasound guided access right common femoral artery for right-sided cervical/cerebral angiogram and mechanical thrombectomy of proximal right M1 occlusion, achieving TICI 3 perfusion with 2 passes combination solitaire/aspiration, and direct aspiration. Angio-Seal deployed for hemostasis Signed, Dulcy Fanny. Dellia Nims, RPVI [facility] Radiology PLAN: The patient will remain intubated, given her hemodynamic instability upon attempted extubation ICU status Target systolic blood pressure of 120-140 Right hip straight time 6 hours Frequent neurovascular checks Repeat neurologic imaging with CT and/MRI at the discretion of neurology team Electronically Signed   By: Corrie Mckusick D.O.   On: 06/25/2021 08:35   IR US Guide Vasc Access Right  Result Date: 06/25/2021 INDICATION: 87 year old female presents with acute right MCA syndrome, for mechanical thrombectomy EXAM: ULTRASOUND-GUIDED ACCESS RIGHT COMMON FEMORAL ARTERY CERVICAL AND CEREBRAL ANGIOGRAM MECHANICAL THROMBECTOMY RIGHT MCA ANGIO-SEAL FOR HEMOSTASIS COMPARISON:  CT imaging of the same day MEDICATIONS: None ANESTHESIA/SEDATION: The anesthesia team was present to provide general endotracheal tube anesthesia and for patient monitoring during the procedure. Intubation was performed in neuro IR biplane room. Left radial arterial line was performed by the anesthesia team. Interventional neuro radiology nursing staff was also present. CONTRAST:  75 cc  FLUOROSCOPY TIME:  Fluoroscopy Time: 20 minutes 24 seconds (1154 mGy). COMPLICATIONS: None TECHNIQUE: Informed written consent was obtained from the patient's family after a thorough discussion of the procedural risks, benefits and alternatives. Specific risks discussed include: Bleeding, infection, contrast reaction, kidney injury/failure, need for further procedure/surgery, arterial injury or dissection, embolization to new territory, intracranial hemorrhage (10-15% risk), neurologic deterioration, cardiopulmonary collapse, death. All questions were addressed. Maximal Sterile Barrier Technique was utilized including during the procedure including caps, mask, sterile gowns, sterile gloves, sterile drape, hand hygiene and skin antiseptic. A timeout was performed prior to the initiation of the procedure. The anesthesia team was present to provide general endotracheal tube anesthesia  and for patient monitoring during the procedure. Interventional neuro radiology nursing staff was also present. FINDINGS: Initial Findings: Right common carotid artery: Significant tortuosity of the common carotid artery at the base, and within the lower neck. No significant stenosis with mild atherosclerotic changes. Right external carotid artery: Patent with antegrade flow. Right internal carotid artery: Moderate tortuosity of the cervical ICA after the origin, with a single 90 degree angulated curvature just after the origin. Mixed calcified and soft plaque at the origin contributes to approximately 65% luminal narrowing by NASCET criteria. Right MCA: MCA is occluded at the origin, proximal M1 on the initial angiogram. 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 There is a relative paucity of perfusion in the basal ganglia despite the presence of the collaterals. Right ACA: A 1 segment patent. A 2 segment perfuses the right territory. Leptomeningeal  collaterals via the anterior cerebral artery Completion Findings: Right MCA: After the first pass of mechanical thrombectomy there is restoration of flow within the proximal segment of the M1. This restored flow into the inferior division, with restoration of flow of the temporal lobe. There remained a loss of 50% of the territory in the superior division, frontal and parietal regions after the first pass. After the second pass of mechanical thrombectomy, there was restoration of flow of both the inferior division and superior division, with complete reperfusion of the MCA territory. The perceived slow flow within the cortical vessels is secondary to occlusive balloon guide within the cervical ICA, and the robust collateral perfusion, with the competing collateral flow of unopacified blood. TICI 3: Complete perfusion of the territory Flat panel CT performed in the room demonstrates no evidence of hemorrhage, midline shift, or mass effect. There is contrast staining within the basal ganglia of the right, compatible with early basal ganglia infarct. PROCEDURE: The anesthesia team was present to provide general endotracheal tube anesthesia and for patient monitoring during the procedure. Intubation was performed in negative pressure [place] in neuro IR holding. Interventional neuro radiology nursing staff was also present. Ultrasound survey of the right inguinal region was performed with images stored and sent to PACs. 11 blade scalpel was used to make a small incision. Blunt dissection was performed with US guidance. A micropuncture needle was used access the right common femoral artery under ultrasound. With excellent arterial blood flow returned, an .018 micro wire was passed through the needle, observed to enter the abdominal aorta under fluoroscopy. The needle was removed, and a micropuncture sheath was placed over the wire. The inner dilator and wire were removed, and an 035 wire was advanced under fluoroscopy into  the abdominal aorta. The sheath was removed and a 25cm 84F straight vascular sheath was placed. The dilator was removed and the sheath was flushed. Sheath was attached to pressurized and heparinized saline bag for constant forward flow. JB 1 glide cath was advanced on the diagnostic catheter into the proximal descending thoracic aorta. Wire was removed and a double flush was performed. The catheter was then used to select the innominate artery, and a Glidewire navigated the catheter into the proximal cervical ICA. Wire was removed and angiogram was performed. Combination of the glide cath and the Glidewire were then used to navigate the catheter into the distal cervical ICA. Wire was removed and a double flush was performed. Rose in wire was then passed through the glide catheter. A coaxial system was then advanced over the [employer] wire. This included  a 95cm 087 "Walrus" balloon guide with coaxial 125cm Berenstein diagnostic catheter. This was advanced to the carotid bifurcation. A telescoping technique was then necessary to navigate the parents teen catheter and the balloon guide through the 65% stenosis of the carotid bifurcation, alternating push on the parents teen catheter and the balloon guide. Ultimately the coaxial system was advanced into the distal cervical ICA. Wire and catheter were gently removed with adequate flush at the hub of the balloon guide. Formal angiogram was performed. Road map function was used once the occluded vessel was identified. Copious back flush was performed and the balloon catheter was attached to heparinized and pressurized saline bag for forward flow. A second coaxial system was then advanced through the balloon catheter, which included the selected intermediate catheter, microcatheter, and microwire. In this scenario, the set up included a 115cm CAT-5 intermediate catheter, a 150cm Trevo [id number] microcatheter, and 014 synchro soft wire. This system was advanced through the  balloon guide catheter under the road-map function, with adequate back-flush at the rotating hemostatic valve at that back end of the balloon guide. Microcatheter and the intermediate catheter system were advanced through the terminal ICA and MCA to the level of the occlusion. The micro wire was then carefully advanced through the occluded segment with a loop configuration. Microcatheter was then manipulated through the occluded segment and the wire was removed with saline drip at the hub. Blood was then aspirated through the hub of the microcatheter, and a gentle contrast injection was performed confirming intraluminal position. A rotating hemostatic valve was then attached to the back end of the microcatheter, and a pressurized and heparinized saline bag was attached to the catheter. 4 x 40 solitaire device was then selected. Back flush was achieved at the rotating hemostatic valve, and then the device was gently advanced through the microcatheter to the distal end. The retriever was then unsheathed by withdrawing the microcatheter under fluoroscopy. Once the retriever was completely unsheathed, the microcatheter was carefully stripped from the delivery device. CT 5 catheter aspiration was initiated and then the catheter was gently advanced on the stem of the retriever until cessation of flow was observed. A 3 minute time interval was observed. The balloon at the balloon guide catheter was not inflated, given there was proximal flow arrest secondary to the occlusive nature across the plaque at the carotid bifurcation. Constant aspiration using the proprietary engine was performed at the intermediate catheter, as the retriever was gently and slowly withdrawn with fluoroscopic observation. Once the retriever was "corked" within the tip of the intermediate catheter, both were removed from the system. Free aspiration was confirmed at the hub of the balloon guide catheter, with free blood return confirmed. Control  angiogram was performed. Restoration of flow in the inferior division was confirmed. Superior division remained occluded. Roadmap angiogram was then performed. The tree Vo microcatheter and a synchro soft wire were then loaded coaxial within a zoom 55 aspiration catheter. This coaxial system was advanced through the balloon guide rotating hemostatic valve, advanced under roadmap angiogram to the division of the M1 into the superior and inferior division. Once the zoom catheter was at this division, the microcatheter microwire were removed. Aspiration was initiated on the zoom catheter, which was then advanced under fluoroscopic guidance into the occlusive M2 segment. Cessation of flow was observed. Once the catheter had been advanced approximately 1-2 cm into the M2 segment, the catheter was gently removed. Spontaneous flow returned once the catheter was withdrawn into the carotid siphon.  Zoom catheter was completely removed from the system, the balloon guide was aspirated, and a control angiogram was performed. Complete restoration of flow was confirmed. Balloon guide was then removed. The skin at the puncture site was then cleaned with Chlorhexidine. The 8 French sheath was removed and an 47F angioseal was deployed. Flat panel CT was performed. At this point, we attempted extubation of the patient. During the cessation of anesthesia, hemodynamic instability was encountered. We first observed a hypertensive episode with systolic blood pressures in [id number] systolic range, tachycardia in the 120-150 range, and then subsequently hypotension, with a range of 70s-105 by the arterial pressure recording. Once the patient had been relatively stabilized, she was transported, intubated, to the [facility]. No complications were encountered. Estimated blood loss: 100 cc IMPRESSION: Status post ultrasound guided access right common femoral artery for right-sided cervical/cerebral angiogram and mechanical thrombectomy of  proximal right M1 occlusion, achieving TICI 3 perfusion with 2 passes combination solitaire/aspiration, and direct aspiration. Angio-Seal deployed for hemostasis Signed, Dulcy Fanny. Dellia Nims, RPVI [facility] Radiology PLAN: The patient will remain intubated, given her hemodynamic instability upon attempted extubation ICU status Target systolic blood pressure of 120-140 Right hip straight time 6 hours Frequent neurovascular checks Repeat neurologic imaging with CT and/MRI at the discretion of neurology team Electronically Signed   By: Corrie Mckusick D.O.   On: 06/25/2021 08:35   CT C-SPINE NO CHARGE  Result Date: 06/24/2021 CLINICAL DATA:  Neck trauma. EXAM: CT CERVICAL SPINE WITHOUT CONTRAST TECHNIQUE: Multidetector CT imaging of the cervical spine was performed without intravenous contrast. Multiplanar CT image reconstructions were also generated. COMPARISON:  Cervical spine CT 01/25/2019. CTA head and neck 06/24/2021. FINDINGS: Alignment: There is levoconvex curvature of the cervical spine. There is 3 mm of anterolisthesis at C7-T1 which is favored as degenerative. Alignment is otherwise anatomic. This is unchanged from the prior examination. Skull base and vertebrae: No acute fracture. No primary bone lesion or focal pathologic process. The bones are diffusely osteopenic. Soft tissues and spinal canal: No prevertebral fluid or swelling. No visible canal hematoma. Please see CTA angiogram head and neck performed same day for further description vascularity/arterial structures. Disc levels: There is disc space narrowing, endplate osteophyte formation and sclerosis from C3 through C7 compatible with degenerative change. Multilevel neural foraminal stenosis, left greater than right, appears unchanged secondary to uncovertebral spurring. There is no severe central canal stenosis at any level. Upper chest: There is scarring in both lung apices. Other: None.  IMPRESSION: No acute fracture or traumatic malalignment of the cervical spine. Electronically Signed   By: Ronney Asters M.D.   On: 06/24/2021 20:47   DG CHEST PORT 1 VIEW  Result Date: 06/24/2021 CLINICAL DATA:  Respiratory distress EXAM: PORTABLE CHEST 1 VIEW COMPARISON:  10/01/2020 FINDINGS: Endotracheal tube tip is at the level of the clavicular heads. There is right hilar prominence, likely an enlarged pulmonary artery. No focal airspace consolidation or pulmonary edema. No sizable pleural effusion. IMPRESSION: 1. Endotracheal tube tip at the level of the clavicular heads. 2. Right hilar prominence, likely enlarged pulmonary artery. Electronically Signed   By: Ulyses Jarred M.D.   On: 06/24/2021 23:10   IR PERCUTANEOUS ART THROMBECTOMY/INFUSION INTRACRANIAL INC DIAG ANGIO  Result Date: 06/25/2021 INDICATION: 87 year old female presents with acute right MCA syndrome, for mechanical thrombectomy EXAM: ULTRASOUND-GUIDED ACCESS RIGHT COMMON FEMORAL ARTERY CERVICAL AND CEREBRAL ANGIOGRAM MECHANICAL THROMBECTOMY RIGHT MCA ANGIO-SEAL FOR HEMOSTASIS COMPARISON:  CT imaging of the same  day MEDICATIONS: None ANESTHESIA/SEDATION: The anesthesia team was present to provide general endotracheal tube anesthesia and for patient monitoring during the procedure. Intubation was performed in neuro IR biplane room. Left radial arterial line was performed by the anesthesia team. Interventional neuro radiology nursing staff was also present. CONTRAST:  75 cc FLUOROSCOPY TIME:  Fluoroscopy Time: 20 minutes 24 seconds (1154 mGy). COMPLICATIONS: None TECHNIQUE: Informed written consent was obtained from the patient's family after a thorough discussion of the procedural risks, benefits and alternatives. Specific risks discussed include: Bleeding, infection, contrast reaction, kidney injury/failure, need for further procedure/surgery, arterial injury or dissection, embolization to new territory, intracranial hemorrhage (10-15%  risk), neurologic deterioration, cardiopulmonary collapse, death. All questions were addressed. Maximal Sterile Barrier Technique was utilized including during the procedure including caps, mask, sterile gowns, sterile gloves, sterile drape, hand hygiene and skin antiseptic. A timeout was performed prior to the initiation of the procedure. The anesthesia team was present to provide general endotracheal tube anesthesia and for patient monitoring during the procedure. Interventional neuro radiology nursing staff was also present. FINDINGS: Initial Findings: Right common carotid artery: Significant tortuosity of the common carotid artery at the base, and within the lower neck. No significant stenosis with mild atherosclerotic changes. Right external carotid artery: Patent with antegrade flow. Right internal carotid artery: Moderate tortuosity of the cervical ICA after the origin, with a single 90 degree angulated curvature just after the origin. Mixed calcified and soft plaque at the origin contributes to approximately 65% luminal narrowing by NASCET criteria. Right MCA: MCA is occluded at the origin, proximal M1 on the initial angiogram. 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 There is a relative paucity of perfusion in the basal ganglia despite the presence of the collaterals. Right ACA: A 1 segment patent. A 2 segment perfuses the right territory. Leptomeningeal collaterals via the anterior cerebral artery Completion Findings: Right MCA: After the first pass of mechanical thrombectomy there is restoration of flow within the proximal segment of the M1. This restored flow into the inferior division, with restoration of flow of the temporal lobe. There remained a loss of 50% of the territory in the superior division, frontal and parietal regions after the first pass. After the second pass of mechanical thrombectomy, there was  restoration of flow of both the inferior division and superior division, with complete reperfusion of the MCA territory. The perceived slow flow within the cortical vessels is secondary to occlusive balloon guide within the cervical ICA, and the robust collateral perfusion, with the competing collateral flow of unopacified blood. TICI 3: Complete perfusion of the territory Flat panel CT performed in the room demonstrates no evidence of hemorrhage, midline shift, or mass effect. There is contrast staining within the basal ganglia of the right, compatible with early basal ganglia infarct. PROCEDURE: The anesthesia team was present to provide general endotracheal tube anesthesia and for patient monitoring during the procedure. Intubation was performed in negative pressure [place] in neuro IR holding. Interventional neuro radiology nursing staff was also present. Ultrasound survey of the right inguinal region was performed with images stored and sent to PACs. 11 blade scalpel was used to make a small incision. Blunt dissection was performed with US guidance. A micropuncture needle was used access the right common femoral artery under ultrasound. With excellent arterial blood flow returned, an .018 micro wire was passed through the needle, observed to enter the abdominal aorta under fluoroscopy. The needle was removed, and  a micropuncture sheath was placed over the wire. The inner dilator and wire were removed, and an 035 wire was advanced under fluoroscopy into the abdominal aorta. The sheath was removed and a 25cm 37F straight vascular sheath was placed. The dilator was removed and the sheath was flushed. Sheath was attached to pressurized and heparinized saline bag for constant forward flow. JB 1 glide cath was advanced on the diagnostic catheter into the proximal descending thoracic aorta. Wire was removed and a double flush was performed. The catheter was then used to select the innominate artery, and a Glidewire  navigated the catheter into the proximal cervical ICA. Wire was removed and angiogram was performed. Combination of the glide cath and the Glidewire were then used to navigate the catheter into the distal cervical ICA. Wire was removed and a double flush was performed. Rose in wire was then passed through the glide catheter. A coaxial system was then advanced over the [employer] wire. This included a 95cm 087 "Walrus" balloon guide with coaxial 125cm Berenstein diagnostic catheter. This was advanced to the carotid bifurcation. A telescoping technique was then necessary to navigate the parents teen catheter and the balloon guide through the 65% stenosis of the carotid bifurcation, alternating push on the parents teen catheter and the balloon guide. Ultimately the coaxial system was advanced into the distal cervical ICA. Wire and catheter were gently removed with adequate flush at the hub of the balloon guide. Formal angiogram was performed. Road map function was used once the occluded vessel was identified. Copious back flush was performed and the balloon catheter was attached to heparinized and pressurized saline bag for forward flow. A second coaxial system was then advanced through the balloon catheter, which included the selected intermediate catheter, microcatheter, and microwire. In this scenario, the set up included a 115cm CAT-5 intermediate catheter, a 150cm Trevo [id number] microcatheter, and 014 synchro soft wire. This system was advanced through the balloon guide catheter under the road-map function, with adequate back-flush at the rotating hemostatic valve at that back end of the balloon guide. Microcatheter and the intermediate catheter system were advanced through the terminal ICA and MCA to the level of the occlusion. The micro wire was then carefully advanced through the occluded segment with a loop configuration. Microcatheter was then manipulated through the occluded segment and the wire was removed  with saline drip at the hub. Blood was then aspirated through the hub of the microcatheter, and a gentle contrast injection was performed confirming intraluminal position. A rotating hemostatic valve was then attached to the back end of the microcatheter, and a pressurized and heparinized saline bag was attached to the catheter. 4 x 40 solitaire device was then selected. Back flush was achieved at the rotating hemostatic valve, and then the device was gently advanced through the microcatheter to the distal end. The retriever was then unsheathed by withdrawing the microcatheter under fluoroscopy. Once the retriever was completely unsheathed, the microcatheter was carefully stripped from the delivery device. CT 5 catheter aspiration was initiated and then the catheter was gently advanced on the stem of the retriever until cessation of flow was observed. A 3 minute time interval was observed. The balloon at the balloon guide catheter was not inflated, given there was proximal flow arrest secondary to the occlusive nature across the plaque at the carotid bifurcation. Constant aspiration using the proprietary engine was performed at the intermediate catheter, as the retriever was gently and slowly withdrawn with fluoroscopic observation. Once the retriever was "  corked" within the tip of the intermediate catheter, both were removed from the system. Free aspiration was confirmed at the hub of the balloon guide catheter, with free blood return confirmed. Control angiogram was performed. Restoration of flow in the inferior division was confirmed. Superior division remained occluded. Roadmap angiogram was then performed. The tree Vo microcatheter and a synchro soft wire were then loaded coaxial within a zoom 55 aspiration catheter. This coaxial system was advanced through the balloon guide rotating hemostatic valve, advanced under roadmap angiogram to the division of the M1 into the superior and inferior division. Once the  zoom catheter was at this division, the microcatheter microwire were removed. Aspiration was initiated on the zoom catheter, which was then advanced under fluoroscopic guidance into the occlusive M2 segment. Cessation of flow was observed. Once the catheter had been advanced approximately 1-2 cm into the M2 segment, the catheter was gently removed. Spontaneous flow returned once the catheter was withdrawn into the carotid siphon. Zoom catheter was completely removed from the system, the balloon guide was aspirated, and a control angiogram was performed. Complete restoration of flow was confirmed. Balloon guide was then removed. The skin at the puncture site was then cleaned with Chlorhexidine. The 8 French sheath was removed and an 38F angioseal was deployed. Flat panel CT was performed. At this point, we attempted extubation of the patient. During the cessation of anesthesia, hemodynamic instability was encountered. We first observed a hypertensive episode with systolic blood pressures in [id number] systolic range, tachycardia in the 120-150 range, and then subsequently hypotension, with a range of 70s-105 by the arterial pressure recording. Once the patient had been relatively stabilized, she was transported, intubated, to the [place] ICU. No complications were encountered. Estimated blood loss: 100 cc IMPRESSION: Status post ultrasound guided access right common femoral artery for right-sided cervical/cerebral angiogram and mechanical thrombectomy of proximal right M1 occlusion, achieving TICI 3 perfusion with 2 passes combination solitaire/aspiration, and direct aspiration. Angio-Seal deployed for hemostasis Signed, Dulcy Fanny. Dellia Nims, RPVI [facility] Inter[facility] Radiology Special[facility] Radiology PLAN: The patient will remain intubated, given her hemodynamic instability upon attempted extubation ICU status Target systolic blood pressure of 120-140 Right hip straight time 6 hours Frequent neurovascular  checks Repeat neurologic imaging with CT and/MRI at the discretion of neurology team Electronically Signed   By: Corrie Mckusick D.O.   On: 06/25/2021 08:35   CT HEAD CODE STROKE WO CONTRAST  Result Date: 06/24/2021 CLINICAL DATA:  Code stroke.  Neuro deficit, acute, stroke suspected EXAM: CT HEAD WITHOUT CONTRAST TECHNIQUE: Contiguous axial images were obtained from the base of the skull through the vertex without intravenous contrast. COMPARISON:  CT head 10/01/2020. FINDINGS: Brain: No evidence of acute large vascular territory infarction, hemorrhage, hydrocephalus, extra-axial collection or mass lesion/mass effect. Mild patchy white matter hypodensities, nonspecific but compatible with chronic microvascular ischemic disease. Chronic basal ganglia mineralization. Vascular: No hyperdense vessel identified. Skull: No acute fracture. Sinuses/Orbits: Clear sinuses.  No acute orbital finding. Other: No mastoid effusions. ASPECTS [facility] Stroke Program Early CT Score) Total score (0-10 with 10 being normal): 10. IMPRESSION: 1. No evidence of acute large vascular territory infarct or acute hemorrhage. 2. ASPECTS is 10. Code stroke imaging results were communicated on 06/24/2021 at 7:27 pm to provider Dr. Curly Shores Via secure text paging. Electronically Signed   By: Margaretha Sheffield M.D.   On: 06/24/2021 19:28   CT ANGIO HEAD NECK W WO CM W PERF (CODE STROKE)  Result Date: 06/24/2021 CLINICAL  DATA:  Neuro deficit, acute, stroke suspected EXAM: CT ANGIOGRAPHY HEAD AND NECK CT PERFUSION BRAIN TECHNIQUE: Multidetector CT imaging of the head and neck was performed using the standard protocol during bolus administration of intravenous contrast. Multiplanar CT image reconstructions and MIPs were obtained to evaluate the vascular anatomy. Carotid stenosis measurements (when applicable) are obtained utilizing NASCET criteria, using the distal internal carotid diameter as the denominator. Multiphase CT imaging of the brain  was performed following IV bolus contrast injection. Subsequent parametric perfusion maps were calculated using RAPID software. CONTRAST:  146m OMNIPAQUE IOHEXOL 350 MG/ML SOLN COMPARISON:  None. FINDINGS: CTA NECK FINDINGS Aortic arch: Vessel origins are patent. Right carotid system: Mixed calcific and noncalcific atherosclerosis of the carotid bifurcation with approximately 60-70% stenosis of the proximal ICA. Left carotid system: Calcific and noncalcific atherosclerosis at the carotid bifurcation with resulting severe stenosis of the proximal ICA, difficult to quantify but likely greater than 80%. Vertebral arteries: Occlusion of the left vertebral artery at its origin with non opacification of the neck. Right vertebral artery is patent without significant (greater than 50%) stenosis. Skeleton: Moderate to severe multilevel degenerative change in the cervical spine. Other neck: No acute abnormality. Upper chest: Small to moderate bilateral layering pleural effusions. Partially imaged fibrotic change, better characterized on prior CT chest from 2019. Debris within the trachea, placing the patient at risk for aspiration. Review of the MIP images confirms the above findings CTA HEAD FINDINGS Anterior circulation: Bilateral intracranial ICAs are patent without significant stenosis. Abrupt occlusion of the proximal right M1 MCA. There are some opacified distal right MCA branches, asymmetrically diminished compared to the left. Right A1 ACA is patent. Left M1 MCA and proximal left anterior MCA branches are patent. Small left A1 ACA, poorly opacified proximally. Bilateral A2 ACAS are patent without significant stenosis. No aneurysm identified. Posterior circulation: Reconstitution of the left intradural vertebral artery. Right vertebral artery and basilar artery are patent without hemodynamically significant stenosis. Somewhat dysplastic appearance of the basilar artery without discrete aneurysm. Bilateral posterior  communicating arteries. Bilateral posterior cerebral arteries are patent without proximal hemodynamically significant stenosis. Venous sinuses: Nondiagnostic evaluation due to arterial timing. Anatomic variants: See above. Review of the MIP images confirms the above findings CT Brain Perfusion Findings: ASPECTS: 10 CBF (<30%) Volume: 035mPerfusion (Tmax>6.0s) volume: 7981mismatch Volume: 73m63mfarction Location:None IMPRESSION: 1. Occlusion of the proximal right M1 MCA. 2. Large area of right MCA territory penumbra (79 mL) without evidence of core infarct. 3. 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 4. Severe left (difficult to quantify but likely greater than 80%) and approximately 70% right proximal ICA stenosis in the neck due to atherosclerosis. 5. Small left A1 ACA, poorly opacified proximally. 6. Small to moderate bilateral pleural effusions. 7. Debris within the trachea, placing the patient at risk for aspiration. 8. Partially visualized fibrotic change in the lung apices, better characterized on prior CT chest from 2019. Critical findings were communicated on 06/24/2021 at 8:00 pm to provider Dr. BhagMaurine Minister telephone, who verbally acknowledged these results. Electronically Signed   By: FredMargaretha Sheffield.   On: 06/24/2021 20:20    Labs:  CBC: Recent Labs    10/01/20 0100 06/24/21 1914 06/24/21 1919 06/24/21 2301 06/25/21 0402  WBC 9.1 14.8*  --   --   --   HGB 11.9* 11.5* 12.9 10.9* 10.9*  HCT 36.0 35.6* 38.0 32.0* 32.0*  PLT 371 344  --   --   --  COAGS: Recent Labs    06/24/21 1914  INR 1.3*  APTT 25    BMP: Recent Labs    10/01/20 0100 06/24/21 1914 06/24/21 1919 06/24/21 2301 06/25/21 0402  NA 135 135 136 140 139  K 4.0 3.6 3.6 3.3* 3.3*  CL 101 101 101  --   --   CO2 27 24  --   --   --   GLUCOSE 82 140* 135*  --   --   BUN '12 12 12  '$ --   --   CALCIUM 8.9 8.9  --   --   --   CREATININE 0.63  0.71 0.60  --   --   GFRNONAA >60 >60  --   --   --     LIVER FUNCTION TESTS: Recent Labs    10/01/20 0100 06/24/21 1914  BILITOT 0.5 0.8  AST 24 39  ALT 14 20  ALKPHOS 62 57  PROT 7.0 6.2*  ALBUMIN 3.5 3.1*    Assessment and Plan: Pt with hx afib (not on anticoagulation due to frequent falls), dementia, HTN, hypothyroidism, uterine cancer, admitted 9/13 with rt MCA CVA (left sided weakness); s/p cerebral arteriogram with mech thrombectomy rt M1/M2 9/13; intubated; afebrile; f/u imaging today: MRI brain:   1. Acute right MCA territory infarcts within the right cerebral hemisphere and right basal ganglia, as described. Superimposed petechial hemorrhage within the right basal ganglia (Hiedelberg classifications, HI2). Mild mass effect related to the right basal ganglia infarct with subtle partial effacement of the right lateral ventricle. No midline shift. 2. Punctate acute cortical infarct within the medial posterior left frontal lobe. 3. Background mild chronic small vessel ischemic changes within the cerebral white matter. 4. Small chronic lacunar infarct within right cerebellar hemisphere, a new finding as compared to the brain MRI of 01/26/2019. 5. Moderate generalized cerebral atrophy. Comparatively mild cerebellar atrophy.   MRA head:   1. The M1 right middle cerebral artery remains patent status post mechanical thrombectomy performed 06/24/2021. 2. Atherosclerotic irregularity of the M2 and more distal right MCA vessels. Most notably, a moderate stenosis is present within a superior division proximal M2 right MCA vessel. 3. Enhancement within the mid-to-distal V4 left vertebral artery, likely due to retrograde flow (given unchanged apparent occlusion of this vessel more proximally).   Plans as outlined by neuro/CCM  Electronically Signed: D. Rowe Robert, PA-C 06/25/2021, 12:13 PM   I spent a total of 15 minutes at the the patient's bedside AND on the  patient's hospital floor or unit, greater than 50% of which was counseling/coordinating care for cerebral arteriogram with endovascular intervention

## 2021-06-25 NOTE — Care Plan (Signed)
GOALS OF CARE DISCUSSION   The Clinical status was relayed to Patient's son and daughter at bedside  in detail.   Updated and notified of patients medical condition.   Explained that patient may have difficulty breathing after extubation as she required reintubation overnight, patient's family decided to proceed with a DNR/DNI and one-way extubation  Patient tolerated spontaneous breathing trial, was successfully extubated, so far doing well. In case if patient started getting tired or struggles with breathing, patient's family would like to proceed with comfort measures.    Patient is DNR/DNI, orders written     Family are satisfied with Plan of action and management. All questions answered   Additional CC time 20 mins    Jacky Kindle MD [facility] See Amion for pager If no response to pager, please call [phone] until 7pm After 7pm, Please call E-link [phone]

## 2021-06-25 NOTE — Progress Notes (Signed)
2D echo attempted, patient in MRI.

## 2021-06-25 NOTE — Progress Notes (Signed)
eLink Physician-Brief Progress Note Patient Name: Dominique [name] DOB: 08/29/1933 MRN: [id number]   Date of Service  06/25/2021  HPI/Events of Note  Patient's blood pressure was transiently soft but is now 139/69 with a MAP of 88-90.  eICU Interventions  No intervention at this time, will address any recurrence.        Najah Liverman U Brilee Port 06/25/2021, 1:11 AM

## 2021-06-25 NOTE — Progress Notes (Signed)
eLink Physician-Brief Progress Note Patient Name: CLARAMAE MCCAY DOB: 11-09-1932 MRN: [id number]   Date of Service  06/25/2021  HPI/Events of Note  Atrial fibrillation with RVR, heart rate 130-150.  eICU Interventions  12 lead EKG stat, Amiodarone 150 mg iv bolus x 1 ordered.        Kerry Kass Kohen Reither 06/25/2021, 3:16 AM

## 2021-06-25 NOTE — Progress Notes (Signed)
OT Cancellation Note  Patient Details Name: DELLA LANASA MRN: [id number] DOB: Jul 01, 1933   Cancelled Treatment:    Reason Eval/Treat Not Completed: Patient not medically ready intubated sedated Rn requesting to hold  Billey Chang, OTR/L  Acute Rehabilitation Services Pager: [phone] Office: [phone] .  06/25/2021, 12:39 PM

## 2021-06-25 NOTE — Progress Notes (Addendum)
STROKE TEAM PROGRESS NOTE   INTERVAL HISTORY 87 year old female with dementia and atrial fibrillation not on anticoagulation due to frequent falls, found down.  CTA showed ocluded proximal right M1 MCA, patient is now s/p  thrombectomy. No TNKase given due to inability to confirm last known well  No one is at the bedside at time of this exam.  Patient is intubated.  Not sedated.  Arousable.  Follows simple commands on the right side but has left hemiplegia.  MRI scan is pending.  Blood pressure adequately controlled. Vitals:   06/25/21 1100 06/25/21 1145 06/25/21 1200 06/25/21 1300  BP: (!) 172/83 133/62 (!) 102/43 113/60  Pulse: (!) 52 (!) 55 65 63  Resp: '13 11 15 13  '$ Temp:   98.4 F (36.9 C)   TempSrc:   Axillary   SpO2: 100% 98% 98% 99%  Weight:      Height:       CBC:  Recent Labs  Lab 06/24/21 1914 06/24/21 1919 06/24/21 2301 06/25/21 0402  WBC 14.8*  --   --   --   NEUTROABS 12.8*  --   --   --   HGB 11.5*   < > 10.9* 10.9*  HCT 35.6*   < > 32.0* 32.0*  MCV 104.4*  --   --   --   PLT 344  --   --   --    < > = values in this interval not displayed.   Basic Metabolic Panel:  Recent Labs  Lab 06/24/21 1914 06/24/21 1919 06/24/21 2301 06/25/21 0402  NA 135 136 140 139  K 3.6 3.6 3.3* 3.3*  CL 101 101  --   --   CO2 24  --   --   --   GLUCOSE 140* 135*  --   --   BUN 12 12  --   --   CREATININE 0.71 0.60  --   --   CALCIUM 8.9  --   --   --     Lipid Panel:  Recent Labs  Lab 06/25/21 0538  CHOL 75  TRIG 46  50  HDL 49  CHOLHDL 1.5  VLDL 9  LDLCALC 17    HgbA1c:  Recent Labs  Lab 06/25/21 0538  HGBA1C 5.3   Urine Drug Screen: No results for input(s): LABOPIA, COCAINSCRNUR, LABBENZ, AMPHETMU, THCU, LABBARB in the last 168 hours.  Alcohol Level  Recent Labs  Lab 06/24/21 1914  ETH <10    IMAGING past 24 hours MR ANGIO HEAD WO CONTRAST  Result Date: 06/25/2021 CLINICAL DATA:  Stroke, follow-up. EXAM: MRI HEAD WITHOUT CONTRAST MRA HEAD  WITHOUT CONTRAST TECHNIQUE: Multiplanar, multi-echo pulse sequences of the brain and surrounding structures were acquired without intravenous contrast. Angiographic images of the Circle of Willis were acquired using MRA technique without intravenous contrast. COMPARISON:  Report from mechanical thrombectomy 06/24/2021. Noncontrast head CT 06/24/2021, CT angiogram head/neck and CT perfusion 06/24/2021. brain MRI 01/26/2019. FINDINGS: MRI HEAD FINDINGS Brain: Cerebral volume is normal. Moderate generalized cerebral atrophy. Comparatively mild cerebellar atrophy. Acute infarct within the right basal ganglia involving much of the right caudate and lentiform nuclei. Superimposed petechial hemorrhage within the right basal ganglia (Hiedelberg classifications, HI2). Mild local mass effect related to the infarction with subtle partial effacement of the right lateral ventricle. There are additional patchy small to moderate sized acute cortically-based infarcts within the right MCA vascular territory with involvement of the right frontal, parietal, posterior temporal and lateral occipital lobes.  Notably, a small acute infarct involves the right motor strip. Additional punctate acute infarct within the right parietal lobe white matter. Punctate acute cortical infarct within the medial posterior left frontal lobe (precentral gyrus (series 5, image 95). Background mild multifocal T2/FLAIR hyperintensity within the cerebral white matter, nonspecific but compatible with chronic small vessel ischemic disease. Small chronic lacunar infarct within the right cerebellar hemisphere, new from the brain MRI of 01/26/2019 (series 10, image 5). No evidence of an intracranial mass. No extra-axial fluid collection. No midline shift. Vascular: Maintained flow voids within the proximal large arterial vessels. Skull and upper cervical spine: No focal suspicious marrow lesion. Incompletely assessed cervical spondylosis. Sinuses/Orbits: Visualized  orbits show no acute finding. Bilateral lens replacements. No significant paranasal sinus disease. MRA HEAD FINDINGS Anterior circulation: The intracranial internal carotid arteries are patent. The M1 right middle cerebral artery is now patent. No right M2 proximal branch occlusion is identified. Atherosclerotic irregularity of the right M2 and more distal right MCA vessels. Most notably, there is a moderate stenosis within a superior division proximal M2 right MCA vessel (series 1053, image 16). The M1 left middle cerebral artery is patent. No left M2 proximal branch occlusion or high-grade proximal stenosis. The anterior cerebral arteries are patent. Redemonstrated hypoplastic left A1 segment. No intracranial aneurysm is identified. Posterior circulation: Unchanged from the CTA head/neck of 06/24/2021, the left vertebral artery is occluded at the visualized distal cervical levels and at the proximal V4 level. There is enhancement within the mid and distal V4 left vertebral artery, likely due to retrograde flow. The V4 right vertebral artery is patent. The basilar artery is patent. The posterior cerebral arteries are patent. Posterior communicating arteries are present bilaterally. Anatomic variants: As described. IMPRESSION: MRI brain: 1. Acute right MCA territory infarcts within the right cerebral hemisphere and right basal ganglia, as described. Superimposed petechial hemorrhage within the right basal ganglia (Hiedelberg classifications, HI2). Mild mass effect related to the right basal ganglia infarct with subtle partial effacement of the right lateral ventricle. No midline shift. 2. Punctate acute cortical infarct within the medial posterior left frontal lobe. 3. Background mild chronic small vessel ischemic changes within the cerebral white matter. 4. Small chronic lacunar infarct within right cerebellar hemisphere, a new finding as compared to the brain MRI of 01/26/2019. 5. Moderate generalized cerebral  atrophy. Comparatively mild cerebellar atrophy. MRA head: 1. The M1 right middle cerebral artery remains patent status post mechanical thrombectomy performed 06/24/2021. 2. Atherosclerotic irregularity of the M2 and more distal right MCA vessels. Most notably, a moderate stenosis is present within a superior division proximal M2 right MCA vessel. 3. Enhancement within the mid-to-distal V4 left vertebral artery, likely due to retrograde flow (given unchanged apparent occlusion of this vessel more proximally). Electronically Signed   By: Kellie Simmering D.O.   On: 06/25/2021 11:01   MR BRAIN WO CONTRAST  Result Date: 06/25/2021 CLINICAL DATA:  Stroke, follow-up. EXAM: MRI HEAD WITHOUT CONTRAST MRA HEAD WITHOUT CONTRAST TECHNIQUE: Multiplanar, multi-echo pulse sequences of the brain and surrounding structures were acquired without intravenous contrast. Angiographic images of the Circle of Willis were acquired using MRA technique without intravenous contrast. COMPARISON:  Report from mechanical thrombectomy 06/24/2021. Noncontrast head CT 06/24/2021, CT angiogram head/neck and CT perfusion 06/24/2021. brain MRI 01/26/2019. FINDINGS: MRI HEAD FINDINGS Brain: Cerebral volume is normal. Moderate generalized cerebral atrophy. Comparatively mild cerebellar atrophy. Acute infarct within the right basal ganglia involving much of the right caudate and lentiform nuclei. Superimposed petechial hemorrhage within  the right basal ganglia (Hiedelberg classifications, HI2). Mild local mass effect related to the infarction with subtle partial effacement of the right lateral ventricle. There are additional patchy small to moderate sized acute cortically-based infarcts within the right MCA vascular territory with involvement of the right frontal, parietal, posterior temporal and lateral occipital lobes. Notably, a small acute infarct involves the right motor strip. Additional punctate acute infarct within the right parietal lobe white  matter. Punctate acute cortical infarct within the medial posterior left frontal lobe (precentral gyrus (series 5, image 95). Background mild multifocal T2/FLAIR hyperintensity within the cerebral white matter, nonspecific but compatible with chronic small vessel ischemic disease. Small chronic lacunar infarct within the right cerebellar hemisphere, new from the brain MRI of 01/26/2019 (series 10, image 5). No evidence of an intracranial mass. No extra-axial fluid collection. No midline shift. Vascular: Maintained flow voids within the proximal large arterial vessels. Skull and upper cervical spine: No focal suspicious marrow lesion. Incompletely assessed cervical spondylosis. Sinuses/Orbits: Visualized orbits show no acute finding. Bilateral lens replacements. No significant paranasal sinus disease. MRA HEAD FINDINGS Anterior circulation: The intracranial internal carotid arteries are patent. The M1 right middle cerebral artery is now patent. No right M2 proximal branch occlusion is identified. Atherosclerotic irregularity of the right M2 and more distal right MCA vessels. Most notably, there is a moderate stenosis within a superior division proximal M2 right MCA vessel (series 1053, image 16). The M1 left middle cerebral artery is patent. No left M2 proximal branch occlusion or high-grade proximal stenosis. The anterior cerebral arteries are patent. Redemonstrated hypoplastic left A1 segment. No intracranial aneurysm is identified. Posterior circulation: Unchanged from the CTA head/neck of 06/24/2021, the left vertebral artery is occluded at the visualized distal cervical levels and at the proximal V4 level. There is enhancement within the mid and distal V4 left vertebral artery, likely due to retrograde flow. The V4 right vertebral artery is patent. The basilar artery is patent. The posterior cerebral arteries are patent. Posterior communicating arteries are present bilaterally. Anatomic variants: As described.  IMPRESSION: MRI brain: 1. Acute right MCA territory infarcts within the right cerebral hemisphere and right basal ganglia, as described. Superimposed petechial hemorrhage within the right basal ganglia (Hiedelberg classifications, HI2). Mild mass effect related to the right basal ganglia infarct with subtle partial effacement of the right lateral ventricle. No midline shift. 2. Punctate acute cortical infarct within the medial posterior left frontal lobe. 3. Background mild chronic small vessel ischemic changes within the cerebral white matter. 4. Small chronic lacunar infarct within right cerebellar hemisphere, a new finding as compared to the brain MRI of 01/26/2019. 5. Moderate generalized cerebral atrophy. Comparatively mild cerebellar atrophy. MRA head: 1. The M1 right middle cerebral artery remains patent status post mechanical thrombectomy performed 06/24/2021. 2. Atherosclerotic irregularity of the M2 and more distal right MCA vessels. Most notably, a moderate stenosis is present within a superior division proximal M2 right MCA vessel. 3. Enhancement within the mid-to-distal V4 left vertebral artery, likely due to retrograde flow (given unchanged apparent occlusion of this vessel more proximally). Electronically Signed   By: Kellie Simmering D.O.   On: 06/25/2021 11:01   IR CT Head Ltd  Result Date: 06/25/2021 INDICATION: 87 year old female presents with acute right MCA syndrome, for mechanical thrombectomy EXAM: ULTRASOUND-GUIDED ACCESS RIGHT COMMON FEMORAL ARTERY CERVICAL AND CEREBRAL ANGIOGRAM MECHANICAL THROMBECTOMY RIGHT MCA ANGIO-SEAL FOR HEMOSTASIS COMPARISON:  CT imaging of the same day MEDICATIONS: None ANESTHESIA/SEDATION: The anesthesia team was present to provide general  endotracheal tube anesthesia and for patient monitoring during the procedure. Intubation was performed in neuro IR biplane room. Left radial arterial line was performed by the anesthesia team. Interventional neuro radiology  nursing staff was also present. CONTRAST:  75 cc FLUOROSCOPY TIME:  Fluoroscopy Time: 20 minutes 24 seconds (1154 mGy). COMPLICATIONS: None TECHNIQUE: Informed written consent was obtained from the patient's family after a thorough discussion of the procedural risks, benefits and alternatives. Specific risks discussed include: Bleeding, infection, contrast reaction, kidney injury/failure, need for further procedure/surgery, arterial injury or dissection, embolization to new territory, intracranial hemorrhage (10-15% risk), neurologic deterioration, cardiopulmonary collapse, death. All questions were addressed. Maximal Sterile Barrier Technique was utilized including during the procedure including caps, mask, sterile gowns, sterile gloves, sterile drape, hand hygiene and skin antiseptic. A timeout was performed prior to the initiation of the procedure. The anesthesia team was present to provide general endotracheal tube anesthesia and for patient monitoring during the procedure. Interventional neuro radiology nursing staff was also present. FINDINGS: Initial Findings: Right common carotid artery: Significant tortuosity of the common carotid artery at the base, and within the lower neck. No significant stenosis with mild atherosclerotic changes. Right external carotid artery: Patent with antegrade flow. Right internal carotid artery: Moderate tortuosity of the cervical ICA after the origin, with a single 90 degree angulated curvature just after the origin. Mixed calcified and soft plaque at the origin contributes to approximately 65% luminal narrowing by NASCET criteria. Right MCA: MCA is occluded at the origin, proximal M1 on the initial angiogram. 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 There is a relative paucity of perfusion in the basal ganglia despite the presence of the collaterals. Right ACA: A 1 segment patent. A 2 segment  perfuses the right territory. Leptomeningeal collaterals via the anterior cerebral artery Completion Findings: Right MCA: After the first pass of mechanical thrombectomy there is restoration of flow within the proximal segment of the M1. This restored flow into the inferior division, with restoration of flow of the temporal lobe. There remained a loss of 50% of the territory in the superior division, frontal and parietal regions after the first pass. After the second pass of mechanical thrombectomy, there was restoration of flow of both the inferior division and superior division, with complete reperfusion of the MCA territory. The perceived slow flow within the cortical vessels is secondary to occlusive balloon guide within the cervical ICA, and the robust collateral perfusion, with the competing collateral flow of unopacified blood. TICI 3: Complete perfusion of the territory Flat panel CT performed in the room demonstrates no evidence of hemorrhage, midline shift, or mass effect. There is contrast staining within the basal ganglia of the right, compatible with early basal ganglia infarct. PROCEDURE: The anesthesia team was present to provide general endotracheal tube anesthesia and for patient monitoring during the procedure. Intubation was performed in negative pressure [place] in neuro IR holding. Interventional neuro radiology nursing staff was also present. Ultrasound survey of the right inguinal region was performed with images stored and sent to PACs. 11 blade scalpel was used to make a small incision. Blunt dissection was performed with US guidance. A micropuncture needle was used access the right common femoral artery under ultrasound. With excellent arterial blood flow returned, an .018 micro wire was passed through the needle, observed to enter the abdominal aorta under fluoroscopy. The needle was removed, and a micropuncture sheath was placed over the wire. The inner dilator and  wire were removed, and an  035 wire was advanced under fluoroscopy into the abdominal aorta. The sheath was removed and a 25cm 26F straight vascular sheath was placed. The dilator was removed and the sheath was flushed. Sheath was attached to pressurized and heparinized saline bag for constant forward flow. JB 1 glide cath was advanced on the diagnostic catheter into the proximal descending thoracic aorta. Wire was removed and a double flush was performed. The catheter was then used to select the innominate artery, and a Glidewire navigated the catheter into the proximal cervical ICA. Wire was removed and angiogram was performed. Combination of the glide cath and the Glidewire were then used to navigate the catheter into the distal cervical ICA. Wire was removed and a double flush was performed. Rose in wire was then passed through the glide catheter. A coaxial system was then advanced over the [employer] wire. This included a 95cm 087 "Walrus" balloon guide with coaxial 125cm Berenstein diagnostic catheter. This was advanced to the carotid bifurcation. A telescoping technique was then necessary to navigate the parents teen catheter and the balloon guide through the 65% stenosis of the carotid bifurcation, alternating push on the parents teen catheter and the balloon guide. Ultimately the coaxial system was advanced into the distal cervical ICA. Wire and catheter were gently removed with adequate flush at the hub of the balloon guide. Formal angiogram was performed. Road map function was used once the occluded vessel was identified. Copious back flush was performed and the balloon catheter was attached to heparinized and pressurized saline bag for forward flow. A second coaxial system was then advanced through the balloon catheter, which included the selected intermediate catheter, microcatheter, and microwire. In this scenario, the set up included a 115cm CAT-5 intermediate catheter, a 150cm Trevo [id number] microcatheter, and 014 synchro soft  wire. This system was advanced through the balloon guide catheter under the road-map function, with adequate back-flush at the rotating hemostatic valve at that back end of the balloon guide. Microcatheter and the intermediate catheter system were advanced through the terminal ICA and MCA to the level of the occlusion. The micro wire was then carefully advanced through the occluded segment with a loop configuration. Microcatheter was then manipulated through the occluded segment and the wire was removed with saline drip at the hub. Blood was then aspirated through the hub of the microcatheter, and a gentle contrast injection was performed confirming intraluminal position. A rotating hemostatic valve was then attached to the back end of the microcatheter, and a pressurized and heparinized saline bag was attached to the catheter. 4 x 40 solitaire device was then selected. Back flush was achieved at the rotating hemostatic valve, and then the device was gently advanced through the microcatheter to the distal end. The retriever was then unsheathed by withdrawing the microcatheter under fluoroscopy. Once the retriever was completely unsheathed, the microcatheter was carefully stripped from the delivery device. CT 5 catheter aspiration was initiated and then the catheter was gently advanced on the stem of the retriever until cessation of flow was observed. A 3 minute time interval was observed. The balloon at the balloon guide catheter was not inflated, given there was proximal flow arrest secondary to the occlusive nature across the plaque at the carotid bifurcation. Constant aspiration using the proprietary engine was performed at the intermediate catheter, as the retriever was gently and slowly withdrawn with fluoroscopic observation. Once the retriever was "corked" within the tip of the intermediate catheter, both were removed from  the system. Free aspiration was confirmed at the hub of the balloon guide catheter,  with free blood return confirmed. Control angiogram was performed. Restoration of flow in the inferior division was confirmed. Superior division remained occluded. Roadmap angiogram was then performed. The tree Vo microcatheter and a synchro soft wire were then loaded coaxial within a zoom 55 aspiration catheter. This coaxial system was advanced through the balloon guide rotating hemostatic valve, advanced under roadmap angiogram to the division of the M1 into the superior and inferior division. Once the zoom catheter was at this division, the microcatheter microwire were removed. Aspiration was initiated on the zoom catheter, which was then advanced under fluoroscopic guidance into the occlusive M2 segment. Cessation of flow was observed. Once the catheter had been advanced approximately 1-2 cm into the M2 segment, the catheter was gently removed. Spontaneous flow returned once the catheter was withdrawn into the carotid siphon. Zoom catheter was completely removed from the system, the balloon guide was aspirated, and a control angiogram was performed. Complete restoration of flow was confirmed. Balloon guide was then removed. The skin at the puncture site was then cleaned with Chlorhexidine. The 8 French sheath was removed and an 42F angioseal was deployed. Flat panel CT was performed. At this point, we attempted extubation of the patient. During the cessation of anesthesia, hemodynamic instability was encountered. We first observed a hypertensive episode with systolic blood pressures in [id number] systolic range, tachycardia in the 120-150 range, and then subsequently hypotension, with a range of 70s-105 by the arterial pressure recording. Once the patient had been relatively stabilized, she was transported, intubated, to the [place] ICU. No complications were encountered. Estimated blood loss: 100 cc IMPRESSION: Status post ultrasound guided access right common femoral artery for right-sided cervical/cerebral  angiogram and mechanical thrombectomy of proximal right M1 occlusion, achieving TICI 3 perfusion with 2 passes combination solitaire/aspiration, and direct aspiration. Angio-Seal deployed for hemostasis Signed, Dulcy Fanny. Dellia Nims, RPVI [facility] Radiology PLAN: The patient will remain intubated, given her hemodynamic instability upon attempted extubation ICU status Target systolic blood pressure of 120-140 Right hip straight time 6 hours Frequent neurovascular checks Repeat neurologic imaging with CT and/MRI at the discretion of neurology team Electronically Signed   By: Corrie Mckusick D.O.   On: 06/25/2021 08:35   IR US Guide Vasc Access Right  Result Date: 06/25/2021 INDICATION: 87 year old female presents with acute right MCA syndrome, for mechanical thrombectomy EXAM: ULTRASOUND-GUIDED ACCESS RIGHT COMMON FEMORAL ARTERY CERVICAL AND CEREBRAL ANGIOGRAM MECHANICAL THROMBECTOMY RIGHT MCA ANGIO-SEAL FOR HEMOSTASIS COMPARISON:  CT imaging of the same day MEDICATIONS: None ANESTHESIA/SEDATION: The anesthesia team was present to provide general endotracheal tube anesthesia and for patient monitoring during the procedure. Intubation was performed in neuro IR biplane room. Left radial arterial line was performed by the anesthesia team. Interventional neuro radiology nursing staff was also present. CONTRAST:  75 cc FLUOROSCOPY TIME:  Fluoroscopy Time: 20 minutes 24 seconds (1154 mGy). COMPLICATIONS: None TECHNIQUE: Informed written consent was obtained from the patient's family after a thorough discussion of the procedural risks, benefits and alternatives. Specific risks discussed include: Bleeding, infection, contrast reaction, kidney injury/failure, need for further procedure/surgery, arterial injury or dissection, embolization to new territory, intracranial hemorrhage (10-15% risk), neurologic deterioration, cardiopulmonary collapse, death. All questions were  addressed. Maximal Sterile Barrier Technique was utilized including during the procedure including caps, mask, sterile gowns, sterile gloves, sterile drape, hand hygiene and skin antiseptic. A timeout was performed prior to the  initiation of the procedure. The anesthesia team was present to provide general endotracheal tube anesthesia and for patient monitoring during the procedure. Interventional neuro radiology nursing staff was also present. FINDINGS: Initial Findings: Right common carotid artery: Significant tortuosity of the common carotid artery at the base, and within the lower neck. No significant stenosis with mild atherosclerotic changes. Right external carotid artery: Patent with antegrade flow. Right internal carotid artery: Moderate tortuosity of the cervical ICA after the origin, with a single 90 degree angulated curvature just after the origin. Mixed calcified and soft plaque at the origin contributes to approximately 65% luminal narrowing by NASCET criteria. Right MCA: MCA is occluded at the origin, proximal M1 on the initial angiogram. 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 There is a relative paucity of perfusion in the basal ganglia despite the presence of the collaterals. Right ACA: A 1 segment patent. A 2 segment perfuses the right territory. Leptomeningeal collaterals via the anterior cerebral artery Completion Findings: Right MCA: After the first pass of mechanical thrombectomy there is restoration of flow within the proximal segment of the M1. This restored flow into the inferior division, with restoration of flow of the temporal lobe. There remained a loss of 50% of the territory in the superior division, frontal and parietal regions after the first pass. After the second pass of mechanical thrombectomy, there was restoration of flow of both the inferior division and superior division, with complete  reperfusion of the MCA territory. The perceived slow flow within the cortical vessels is secondary to occlusive balloon guide within the cervical ICA, and the robust collateral perfusion, with the competing collateral flow of unopacified blood. TICI 3: Complete perfusion of the territory Flat panel CT performed in the room demonstrates no evidence of hemorrhage, midline shift, or mass effect. There is contrast staining within the basal ganglia of the right, compatible with early basal ganglia infarct. PROCEDURE: The anesthesia team was present to provide general endotracheal tube anesthesia and for patient monitoring during the procedure. Intubation was performed in negative pressure [place] in neuro IR holding. Interventional neuro radiology nursing staff was also present. Ultrasound survey of the right inguinal region was performed with images stored and sent to PACs. 11 blade scalpel was used to make a small incision. Blunt dissection was performed with US guidance. A micropuncture needle was used access the right common femoral artery under ultrasound. With excellent arterial blood flow returned, an .018 micro wire was passed through the needle, observed to enter the abdominal aorta under fluoroscopy. The needle was removed, and a micropuncture sheath was placed over the wire. The inner dilator and wire were removed, and an 035 wire was advanced under fluoroscopy into the abdominal aorta. The sheath was removed and a 25cm 3F straight vascular sheath was placed. The dilator was removed and the sheath was flushed. Sheath was attached to pressurized and heparinized saline bag for constant forward flow. JB 1 glide cath was advanced on the diagnostic catheter into the proximal descending thoracic aorta. Wire was removed and a double flush was performed. The catheter was then used to select the innominate artery, and a Glidewire navigated the catheter into the proximal cervical ICA. Wire was removed and angiogram was  performed. Combination of the glide cath and the Glidewire were then used to navigate the catheter into the distal cervical ICA. Wire was removed and a double flush was performed. Rose in wire was then passed through the  glide catheter. A coaxial system was then advanced over the [employer] wire. This included a 95cm 087 "Walrus" balloon guide with coaxial 125cm Berenstein diagnostic catheter. This was advanced to the carotid bifurcation. A telescoping technique was then necessary to navigate the parents teen catheter and the balloon guide through the 65% stenosis of the carotid bifurcation, alternating push on the parents teen catheter and the balloon guide. Ultimately the coaxial system was advanced into the distal cervical ICA. Wire and catheter were gently removed with adequate flush at the hub of the balloon guide. Formal angiogram was performed. Road map function was used once the occluded vessel was identified. Copious back flush was performed and the balloon catheter was attached to heparinized and pressurized saline bag for forward flow. A second coaxial system was then advanced through the balloon catheter, which included the selected intermediate catheter, microcatheter, and microwire. In this scenario, the set up included a 115cm CAT-5 intermediate catheter, a 150cm Trevo [id number] microcatheter, and 014 synchro soft wire. This system was advanced through the balloon guide catheter under the road-map function, with adequate back-flush at the rotating hemostatic valve at that back end of the balloon guide. Microcatheter and the intermediate catheter system were advanced through the terminal ICA and MCA to the level of the occlusion. The micro wire was then carefully advanced through the occluded segment with a loop configuration. Microcatheter was then manipulated through the occluded segment and the wire was removed with saline drip at the hub. Blood was then aspirated through the hub of the microcatheter,  and a gentle contrast injection was performed confirming intraluminal position. A rotating hemostatic valve was then attached to the back end of the microcatheter, and a pressurized and heparinized saline bag was attached to the catheter. 4 x 40 solitaire device was then selected. Back flush was achieved at the rotating hemostatic valve, and then the device was gently advanced through the microcatheter to the distal end. The retriever was then unsheathed by withdrawing the microcatheter under fluoroscopy. Once the retriever was completely unsheathed, the microcatheter was carefully stripped from the delivery device. CT 5 catheter aspiration was initiated and then the catheter was gently advanced on the stem of the retriever until cessation of flow was observed. A 3 minute time interval was observed. The balloon at the balloon guide catheter was not inflated, given there was proximal flow arrest secondary to the occlusive nature across the plaque at the carotid bifurcation. Constant aspiration using the proprietary engine was performed at the intermediate catheter, as the retriever was gently and slowly withdrawn with fluoroscopic observation. Once the retriever was "corked" within the tip of the intermediate catheter, both were removed from the system. Free aspiration was confirmed at the hub of the balloon guide catheter, with free blood return confirmed. Control angiogram was performed. Restoration of flow in the inferior division was confirmed. Superior division remained occluded. Roadmap angiogram was then performed. The tree Vo microcatheter and a synchro soft wire were then loaded coaxial within a zoom 55 aspiration catheter. This coaxial system was advanced through the balloon guide rotating hemostatic valve, advanced under roadmap angiogram to the division of the M1 into the superior and inferior division. Once the zoom catheter was at this division, the microcatheter microwire were removed. Aspiration was  initiated on the zoom catheter, which was then advanced under fluoroscopic guidance into the occlusive M2 segment. Cessation of flow was observed. Once the catheter had been advanced approximately 1-2 cm into the M2 segment, the catheter  was gently removed. Spontaneous flow returned once the catheter was withdrawn into the carotid siphon. Zoom catheter was completely removed from the system, the balloon guide was aspirated, and a control angiogram was performed. Complete restoration of flow was confirmed. Balloon guide was then removed. The skin at the puncture site was then cleaned with Chlorhexidine. The 8 French sheath was removed and an 73F angioseal was deployed. Flat panel CT was performed. At this point, we attempted extubation of the patient. During the cessation of anesthesia, hemodynamic instability was encountered. We first observed a hypertensive episode with systolic blood pressures in [id number] systolic range, tachycardia in the 120-150 range, and then subsequently hypotension, with a range of 70s-105 by the arterial pressure recording. Once the patient had been relatively stabilized, she was transported, intubated, to the [place] ICU. No complications were encountered. Estimated blood loss: 100 cc IMPRESSION: Status post ultrasound guided access right common femoral artery for right-sided cervical/cerebral angiogram and mechanical thrombectomy of proximal right M1 occlusion, achieving TICI 3 perfusion with 2 passes combination solitaire/aspiration, and direct aspiration. Angio-Seal deployed for hemostasis Signed, Dulcy Fanny. Dellia Nims, RPVI [facility] Radiology PLAN: The patient will remain intubated, given her hemodynamic instability upon attempted extubation ICU status Target systolic blood pressure of 120-140 Right hip straight time 6 hours Frequent neurovascular checks Repeat neurologic imaging with CT and/MRI at the discretion of neurology team  Electronically Signed   By: Corrie Mckusick D.O.   On: 06/25/2021 08:35   CT C-SPINE NO CHARGE  Result Date: 06/24/2021 CLINICAL DATA:  Neck trauma. EXAM: CT CERVICAL SPINE WITHOUT CONTRAST TECHNIQUE: Multidetector CT imaging of the cervical spine was performed without intravenous contrast. Multiplanar CT image reconstructions were also generated. COMPARISON:  Cervical spine CT 01/25/2019. CTA head and neck 06/24/2021. FINDINGS: Alignment: There is levoconvex curvature of the cervical spine. There is 3 mm of anterolisthesis at C7-T1 which is favored as degenerative. Alignment is otherwise anatomic. This is unchanged from the prior examination. Skull base and vertebrae: No acute fracture. No primary bone lesion or focal pathologic process. The bones are diffusely osteopenic. Soft tissues and spinal canal: No prevertebral fluid or swelling. No visible canal hematoma. Please see CTA angiogram head and neck performed same day for further description vascularity/arterial structures. Disc levels: There is disc space narrowing, endplate osteophyte formation and sclerosis from C3 through C7 compatible with degenerative change. Multilevel neural foraminal stenosis, left greater than right, appears unchanged secondary to uncovertebral spurring. There is no severe central canal stenosis at any level. Upper chest: There is scarring in both lung apices. Other: None. IMPRESSION: No acute fracture or traumatic malalignment of the cervical spine. Electronically Signed   By: Ronney Asters M.D.   On: 06/24/2021 20:47   DG CHEST PORT 1 VIEW  Result Date: 06/24/2021 CLINICAL DATA:  Respiratory distress EXAM: PORTABLE CHEST 1 VIEW COMPARISON:  10/01/2020 FINDINGS: Endotracheal tube tip is at the level of the clavicular heads. There is right hilar prominence, likely an enlarged pulmonary artery. No focal airspace consolidation or pulmonary edema. No sizable pleural effusion. IMPRESSION: 1. Endotracheal tube tip at the level of the  clavicular heads. 2. Right hilar prominence, likely enlarged pulmonary artery. Electronically Signed   By: Ulyses Jarred M.D.   On: 06/24/2021 23:10   IR PERCUTANEOUS ART THROMBECTOMY/INFUSION INTRACRANIAL INC DIAG ANGIO  Result Date: 06/25/2021 INDICATION: 87 year old female presents with acute right MCA syndrome, for mechanical thrombectomy EXAM: ULTRASOUND-GUIDED ACCESS RIGHT COMMON FEMORAL ARTERY CERVICAL AND CEREBRAL  ANGIOGRAM MECHANICAL THROMBECTOMY RIGHT MCA ANGIO-SEAL FOR HEMOSTASIS COMPARISON:  CT imaging of the same day MEDICATIONS: None ANESTHESIA/SEDATION: The anesthesia team was present to provide general endotracheal tube anesthesia and for patient monitoring during the procedure. Intubation was performed in neuro IR biplane room. Left radial arterial line was performed by the anesthesia team. Interventional neuro radiology nursing staff was also present. CONTRAST:  75 cc FLUOROSCOPY TIME:  Fluoroscopy Time: 20 minutes 24 seconds (1154 mGy). COMPLICATIONS: None TECHNIQUE: Informed written consent was obtained from the patient's family after a thorough discussion of the procedural risks, benefits and alternatives. Specific risks discussed include: Bleeding, infection, contrast reaction, kidney injury/failure, need for further procedure/surgery, arterial injury or dissection, embolization to new territory, intracranial hemorrhage (10-15% risk), neurologic deterioration, cardiopulmonary collapse, death. All questions were addressed. Maximal Sterile Barrier Technique was utilized including during the procedure including caps, mask, sterile gowns, sterile gloves, sterile drape, hand hygiene and skin antiseptic. A timeout was performed prior to the initiation of the procedure. The anesthesia team was present to provide general endotracheal tube anesthesia and for patient monitoring during the procedure. Interventional neuro radiology nursing staff was also present. FINDINGS: Initial Findings: Right  common carotid artery: Significant tortuosity of the common carotid artery at the base, and within the lower neck. No significant stenosis with mild atherosclerotic changes. Right external carotid artery: Patent with antegrade flow. Right internal carotid artery: Moderate tortuosity of the cervical ICA after the origin, with a single 90 degree angulated curvature just after the origin. Mixed calcified and soft plaque at the origin contributes to approximately 65% luminal narrowing by NASCET criteria. Right MCA: MCA is occluded at the origin, proximal M1 on the initial angiogram. 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 There is a relative paucity of perfusion in the basal ganglia despite the presence of the collaterals. Right ACA: A 1 segment patent. A 2 segment perfuses the right territory. Leptomeningeal collaterals via the anterior cerebral artery Completion Findings: Right MCA: After the first pass of mechanical thrombectomy there is restoration of flow within the proximal segment of the M1. This restored flow into the inferior division, with restoration of flow of the temporal lobe. There remained a loss of 50% of the territory in the superior division, frontal and parietal regions after the first pass. After the second pass of mechanical thrombectomy, there was restoration of flow of both the inferior division and superior division, with complete reperfusion of the MCA territory. The perceived slow flow within the cortical vessels is secondary to occlusive balloon guide within the cervical ICA, and the robust collateral perfusion, with the competing collateral flow of unopacified blood. TICI 3: Complete perfusion of the territory Flat panel CT performed in the room demonstrates no evidence of hemorrhage, midline shift, or mass effect. There is contrast staining within the basal ganglia of the right, compatible with early  basal ganglia infarct. PROCEDURE: The anesthesia team was present to provide general endotracheal tube anesthesia and for patient monitoring during the procedure. Intubation was performed in negative pressure [place] in neuro IR holding. Interventional neuro radiology nursing staff was also present. Ultrasound survey of the right inguinal region was performed with images stored and sent to PACs. 11 blade scalpel was used to make a small incision. Blunt dissection was performed with US guidance. A micropuncture needle was used access the right common femoral artery under ultrasound. With excellent arterial blood flow returned, an .018 micro wire was passed through  the needle, observed to enter the abdominal aorta under fluoroscopy. The needle was removed, and a micropuncture sheath was placed over the wire. The inner dilator and wire were removed, and an 035 wire was advanced under fluoroscopy into the abdominal aorta. The sheath was removed and a 25cm 46F straight vascular sheath was placed. The dilator was removed and the sheath was flushed. Sheath was attached to pressurized and heparinized saline bag for constant forward flow. JB 1 glide cath was advanced on the diagnostic catheter into the proximal descending thoracic aorta. Wire was removed and a double flush was performed. The catheter was then used to select the innominate artery, and a Glidewire navigated the catheter into the proximal cervical ICA. Wire was removed and angiogram was performed. Combination of the glide cath and the Glidewire were then used to navigate the catheter into the distal cervical ICA. Wire was removed and a double flush was performed. Rose in wire was then passed through the glide catheter. A coaxial system was then advanced over the [employer] wire. This included a 95cm 087 "Walrus" balloon guide with coaxial 125cm Berenstein diagnostic catheter. This was advanced to the carotid bifurcation. A telescoping technique was then necessary to  navigate the parents teen catheter and the balloon guide through the 65% stenosis of the carotid bifurcation, alternating push on the parents teen catheter and the balloon guide. Ultimately the coaxial system was advanced into the distal cervical ICA. Wire and catheter were gently removed with adequate flush at the hub of the balloon guide. Formal angiogram was performed. Road map function was used once the occluded vessel was identified. Copious back flush was performed and the balloon catheter was attached to heparinized and pressurized saline bag for forward flow. A second coaxial system was then advanced through the balloon catheter, which included the selected intermediate catheter, microcatheter, and microwire. In this scenario, the set up included a 115cm CAT-5 intermediate catheter, a 150cm Trevo [id number] microcatheter, and 014 synchro soft wire. This system was advanced through the balloon guide catheter under the road-map function, with adequate back-flush at the rotating hemostatic valve at that back end of the balloon guide. Microcatheter and the intermediate catheter system were advanced through the terminal ICA and MCA to the level of the occlusion. The micro wire was then carefully advanced through the occluded segment with a loop configuration. Microcatheter was then manipulated through the occluded segment and the wire was removed with saline drip at the hub. Blood was then aspirated through the hub of the microcatheter, and a gentle contrast injection was performed confirming intraluminal position. A rotating hemostatic valve was then attached to the back end of the microcatheter, and a pressurized and heparinized saline bag was attached to the catheter. 4 x 40 solitaire device was then selected. Back flush was achieved at the rotating hemostatic valve, and then the device was gently advanced through the microcatheter to the distal end. The retriever was then unsheathed by withdrawing the  microcatheter under fluoroscopy. Once the retriever was completely unsheathed, the microcatheter was carefully stripped from the delivery device. CT 5 catheter aspiration was initiated and then the catheter was gently advanced on the stem of the retriever until cessation of flow was observed. A 3 minute time interval was observed. The balloon at the balloon guide catheter was not inflated, given there was proximal flow arrest secondary to the occlusive nature across the plaque at the carotid bifurcation. Constant aspiration using the proprietary engine was performed at the intermediate catheter,  as the retriever was gently and slowly withdrawn with fluoroscopic observation. Once the retriever was "corked" within the tip of the intermediate catheter, both were removed from the system. Free aspiration was confirmed at the hub of the balloon guide catheter, with free blood return confirmed. Control angiogram was performed. Restoration of flow in the inferior division was confirmed. Superior division remained occluded. Roadmap angiogram was then performed. The tree Vo microcatheter and a synchro soft wire were then loaded coaxial within a zoom 55 aspiration catheter. This coaxial system was advanced through the balloon guide rotating hemostatic valve, advanced under roadmap angiogram to the division of the M1 into the superior and inferior division. Once the zoom catheter was at this division, the microcatheter microwire were removed. Aspiration was initiated on the zoom catheter, which was then advanced under fluoroscopic guidance into the occlusive M2 segment. Cessation of flow was observed. Once the catheter had been advanced approximately 1-2 cm into the M2 segment, the catheter was gently removed. Spontaneous flow returned once the catheter was withdrawn into the carotid siphon. Zoom catheter was completely removed from the system, the balloon guide was aspirated, and a control angiogram was performed. Complete  restoration of flow was confirmed. Balloon guide was then removed. The skin at the puncture site was then cleaned with Chlorhexidine. The 8 French sheath was removed and an 41F angioseal was deployed. Flat panel CT was performed. At this point, we attempted extubation of the patient. During the cessation of anesthesia, hemodynamic instability was encountered. We first observed a hypertensive episode with systolic blood pressures in [id number] systolic range, tachycardia in the 120-150 range, and then subsequently hypotension, with a range of 70s-105 by the arterial pressure recording. Once the patient had been relatively stabilized, she was transported, intubated, to the [place] ICU. No complications were encountered. Estimated blood loss: 100 cc IMPRESSION: Status post ultrasound guided access right common femoral artery for right-sided cervical/cerebral angiogram and mechanical thrombectomy of proximal right M1 occlusion, achieving TICI 3 perfusion with 2 passes combination solitaire/aspiration, and direct aspiration. Angio-Seal deployed for hemostasis Signed, Dulcy Fanny. Dellia Nims, RPVI [facility] Radiology PLAN: The patient will remain intubated, given her hemodynamic instability upon attempted extubation ICU status Target systolic blood pressure of 120-140 Right hip straight time 6 hours Frequent neurovascular checks Repeat neurologic imaging with CT and/MRI at the discretion of neurology team Electronically Signed   By: Corrie Mckusick D.O.   On: 06/25/2021 08:35   CT HEAD CODE STROKE WO CONTRAST  Result Date: 06/24/2021 CLINICAL DATA:  Code stroke.  Neuro deficit, acute, stroke suspected EXAM: CT HEAD WITHOUT CONTRAST TECHNIQUE: Contiguous axial images were obtained from the base of the skull through the vertex without intravenous contrast. COMPARISON:  CT head 10/01/2020. FINDINGS: Brain: No evidence of acute large vascular territory infarction, hemorrhage,  hydrocephalus, extra-axial collection or mass lesion/mass effect. Mild patchy white matter hypodensities, nonspecific but compatible with chronic microvascular ischemic disease. Chronic basal ganglia mineralization. Vascular: No hyperdense vessel identified. Skull: No acute fracture. Sinuses/Orbits: Clear sinuses.  No acute orbital finding. Other: No mastoid effusions. ASPECTS [facility] Stroke Program Early CT Score) Total score (0-10 with 10 being normal): 10. IMPRESSION: 1. No evidence of acute large vascular territory infarct or acute hemorrhage. 2. ASPECTS is 10. Code stroke imaging results were communicated on 06/24/2021 at 7:27 pm to provider Dr. Curly Shores Via secure text paging. Electronically Signed   By: Margaretha Sheffield M.D.   On: 06/24/2021 19:28   CT  ANGIO HEAD NECK W WO CM W PERF (CODE STROKE)  Result Date: 06/24/2021 CLINICAL DATA:  Neuro deficit, acute, stroke suspected EXAM: CT ANGIOGRAPHY HEAD AND NECK CT PERFUSION BRAIN TECHNIQUE: Multidetector CT imaging of the head and neck was performed using the standard protocol during bolus administration of intravenous contrast. Multiplanar CT image reconstructions and MIPs were obtained to evaluate the vascular anatomy. Carotid stenosis measurements (when applicable) are obtained utilizing NASCET criteria, using the distal internal carotid diameter as the denominator. Multiphase CT imaging of the brain was performed following IV bolus contrast injection. Subsequent parametric perfusion maps were calculated using RAPID software. CONTRAST:  125m OMNIPAQUE IOHEXOL 350 MG/ML SOLN COMPARISON:  None. FINDINGS: CTA NECK FINDINGS Aortic arch: Vessel origins are patent. Right carotid system: Mixed calcific and noncalcific atherosclerosis of the carotid bifurcation with approximately 60-70% stenosis of the proximal ICA. Left carotid system: Calcific and noncalcific atherosclerosis at the carotid bifurcation with resulting severe stenosis of the proximal ICA,  difficult to quantify but likely greater than 80%. Vertebral arteries: Occlusion of the left vertebral artery at its origin with non opacification of the neck. Right vertebral artery is patent without significant (greater than 50%) stenosis. Skeleton: Moderate to severe multilevel degenerative change in the cervical spine. Other neck: No acute abnormality. Upper chest: Small to moderate bilateral layering pleural effusions. Partially imaged fibrotic change, better characterized on prior CT chest from 2019. Debris within the trachea, placing the patient at risk for aspiration. Review of the MIP images confirms the above findings CTA HEAD FINDINGS Anterior circulation: Bilateral intracranial ICAs are patent without significant stenosis. Abrupt occlusion of the proximal right M1 MCA. There are some opacified distal right MCA branches, asymmetrically diminished compared to the left. Right A1 ACA is patent. Left M1 MCA and proximal left anterior MCA branches are patent. Small left A1 ACA, poorly opacified proximally. Bilateral A2 ACAS are patent without significant stenosis. No aneurysm identified. Posterior circulation: Reconstitution of the left intradural vertebral artery. Right vertebral artery and basilar artery are patent without hemodynamically significant stenosis. Somewhat dysplastic appearance of the basilar artery without discrete aneurysm. Bilateral posterior communicating arteries. Bilateral posterior cerebral arteries are patent without proximal hemodynamically significant stenosis. Venous sinuses: Nondiagnostic evaluation due to arterial timing. Anatomic variants: See above. Review of the MIP images confirms the above findings CT Brain Perfusion Findings: ASPECTS: 10 CBF (<30%) Volume: 054mPerfusion (Tmax>6.0s) volume: 7978mismatch Volume: 98m33mfarction Location:None IMPRESSION: 1. Occlusion of the proximal right M1 MCA. 2. Large area of right MCA territory penumbra (79 mL) without evidence of core  infarct. 3. 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 4. Severe left (difficult to quantify but likely greater than 80%) and approximately 70% right proximal ICA stenosis in the neck due to atherosclerosis. 5. Small left A1 ACA, poorly opacified proximally. 6. Small to moderate bilateral pleural effusions. 7. Debris within the trachea, placing the patient at risk for aspiration. 8. Partially visualized fibrotic change in the lung apices, better characterized on prior CT chest from 2019. Critical findings were communicated on 06/24/2021 at 8:00 pm to provider Dr. BhagMaurine Minister telephone, who verbally acknowledged these results. Electronically Signed   By: FredMargaretha Sheffield.   On: 06/24/2021 20:20    PHYSICAL EXAM  Mental Status: Patient is intubated, not following any commands  Eyes closed but can be aroused.  Follows simple midline and right-sided commands. Cranial Nerves: II: Visual Fields are no blink to threat left greater  than right though she does appear to regard examiner at times. Pupils are equal, round, and pinpoint in the bright light  III,IV, VI: EOMI without ptosis or diploplia.  V: unable to assess  VII: prior report of left facial droop, patient now intubated  VIII: hearing is intact to voice Remainder Unable to assess secondary to patient's mental status  Motor: She has a dense left hemiplegia  and tone is flaccid in the LUE. Trace extensor posturing in the left upper extremity, triple flexion in the left lower extremity.  she has purposeful movements at right side Localizes with the right upper extremity and spontaneously moves the right lower extremity 2/5 and briskly withdraws Sensory: She is equally responsive to noxious stimulation in all 4 extremities on grimace Cerebellar: Unable to assess secondary to patient's mental status     ASSESSMENT/PLAN Ms. AKAYA HAINS is a 87 y.o. female with history of  y.o.  female with a past medical history significant for atrial fibrillation not on anticoagulation due to frequent falls, baseline dementia, hypertension, hypothyroidism, uterine cancer, prior thrombocytopenia resolved more recently.   History is primarily provided by the son and EMS as well as chart review.   Son notes that she was seen by her regular nurse practitioner the day before at which time she was essentially at her baseline that she continues to struggle with significant right upper extremity pain for which some cortisone and muscle relaxer at a low dose was recommended.  There are varying reports of when she was last seen well today and what her baseline is from EMS, with a possible last known well of 1:30 or 3:30 PM and discovery of the patient down with left-sided weakness at 6:15 PM with a significant laceration on the left upper extremity.  EMS was concerned about twitching/tremoring movements of the right upper extremity and route and did give her 2.5 mg of Versed which alleviated these movements.  Her blood pressures for them were 140s over 100s with heart rates in the 60s to 70s.  She did speak a few words to EMS initially such as saying no when she was asked if she was in pain, but then became nonverbal   At baseline the patient is continent and walks with a walker though she does have frequent falls for which reason she has been taken off of her anticoagulation.  He does not think she would be oriented to month or year at baseline and she is typically not oriented to place either, although she is otherwise conversant and is able to feed herself.  He denies any recent medical concerns or new medications other than the cortisone/muscle relaxer previously mentioned and notes she does take aspirin.   On chart review she had multiple pelvic fractures and right humeral fracture in December 2020 which required significant rehabilitation.  She was taken for mechanical thrombectomy of right M1, then  right M2 with TICI 3 recanalization on 06/24/21. MRI brain today shows Acute right MCA territory infarcts within the right cerebral hemisphere and right basal ganglia,       Stroke:  right mca infarct embolic secondary to right MCA occlusion from atrial fibrillation not on anticoagulation due to fall risk s/p mechanical thrombectomy code Stroke  CT head No acute abnormality.  Small vessel disease. Atrophy.  ASPECTS 10.     CTA head & neck  1. Occlusion of the proximal right M1 MCA. 2. Large area of right MCA territory penumbra (79 mL) without evidence of core  infarct. 3. 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 4. Severe left (difficult to quantify but likely greater than 80%) and approximately 70% right proximal ICA stenosis in the neck due to atherosclerosis. 5. Small left A1 ACA, poorly opacified proximally. 6. Small to moderate bilateral pleural effusions. 7. Debris within the trachea, placing the patient at risk for aspiration. 8. Partially visualized fibrotic change in the lung apices, better     MRI   1. Acute right MCA territory infarcts within the right cerebral hemisphere and right basal ganglia, as described. Superimposed petechial hemorrhage within the right basal ganglia (Hiedelberg classifications, HI2). Mild mass effect related to the right basal ganglia infarct with subtle partial effacement of the right lateral ventricle. No midline shift. 2. Punctate acute cortical infarct within the medial posterior left frontal lobe. 3. Background mild chronic small vessel ischemic changes within the cerebral white matter. 4. Small chronic lacunar infarct within right cerebellar hemisphere, a new finding as compared to the brain MRI of 01/26/2019. 5. Moderate generalized cerebral atrophy. Comparatively mild cerebellar atrophy. MRA  post thrombectomy: 1. The M1 right middle cerebral artery remains patent status  post mechanical thrombectomy performed 06/24/2021. 2. Atherosclerotic irregularity of the M2 and more distal right MCA vessels. Most notably, a moderate stenosis is present within a superior division proximal M2 right MCA vessel. 3. Enhancement within the mid-to-distal V4 left vertebral artery, likely due to retrograde flow (given unchanged apparent occlusion of this vessel more proximally).  2D Echo done and results are pending    LDL 17 HgbA1c 5.3 VTE prophylaxis - scd    Diet   Diet NPO time specified   No antithrombotic prior to admission, now on No antithrombotic.   Therapy recommendations:  pending Disposition:     Hypertension Home meds:  lopressor Stable Permissive hypertension (OK if < 220/120) but gradually normalize in 5-7 days Long-term BP goal normotensive  Hyperlipidemia Home meds:  zocor '20mg'$ ,  resumed in hospital LDL 17, goal < 70  Continue statin at discharge  D  CBGs Recent Labs    06/25/21 0412 06/25/21 0910 06/25/21 1244  GLUCAP 129* 126* 126*    SSI  Other Stroke Risk Factors  Advanced Age >/= 56   Hx stroke/TIA  Uterine cancer  Other Active Problems    Hospital day # 1 I have personally obtained history,examined this patient, reviewed notes, independently viewed imaging studies, participated in medical decision making and plan of care.ROS completed by me personally and pertinent positives fully documented  I have made any additions or clarifications directly to the above note. Agree with note above.  Patient presented with right MCA infarct due to right M1 occlusion and underwent successful mechanical thrombectomy.  She remains intubated for respiratory support.  Continue close neurological monitoring and strict blood pressure control with systolic blood pressure goal between 120-140 for the first 24 hours.  We will hold off on anticoagulation due to petechial hemorrhage.  Repeat CT scan of the head tomorrow morning recommend check MRI scan  and consider extubation as tolerated.  Long discussion over the phone with the patient's son regarding her prognosis and risk-benefit of anticoagulation as well as consideration for CODE STATUS.  He agrees to DNR and one-way extubation but family would like to visit before this is done later this afternoon.  Discussed with Dr. Tacy Learn critical care medicine. This patient is critically ill and at significant risk of neurological worsening, death and care requires  constant monitoring of vital signs, hemodynamics,respiratory and cardiac monitoring, extensive review of multiple databases, frequent neurological assessment, discussion with family, other specialists and medical decision making of high complexity.I have made any additions or clarifications directly to the above note.This critical care time does not reflect procedure time, or teaching time or supervisory time of PA/NP/Med Resident etc but could involve care discussion time.  I spent 45 minutes of neurocritical care time  in the care of  this patient.     Antony Contras, MD Medical Director [facility] Pager: [phone] 06/25/2021 3:30 PM     To contact Stroke Continuity provider, please refer to [email or web address] After hours, contact General Neurology

## 2021-06-25 NOTE — Procedures (Signed)
Extubation Procedure Note  Patient Details:   Name: Dominique [name] DOB: 12/28/1932 MRN: [id number]   Airway Documentation:    Vent end date: 06/25/21 Vent end time: 1720   Evaluation  O2 sats: stable throughout Complications: No apparent complications Patient did tolerate procedure well. Bilateral Breath Sounds: Clear, Diminished   Yes  Patient extubated per order and family wishes to 2L [place] with no apparent complications. Positive cuff leak was noted prior to extubation. Patient is alert and is able to weakly say her name. Vitals are stable. Family is at bedside. RT will continue to monitor.   Jabarri Stefanelli Clyda Greener 06/25/2021, 5:33 PM

## 2021-06-25 NOTE — Progress Notes (Addendum)
NAME:  Dominique [name], MRN:  [id number], DOB:  16-Nov-1932, LOS: 1 ADMISSION DATE:  06/24/2021, CONSULTATION DATE:  9/13 REFERRING MD:  Dr Curly Shores, CHIEF COMPLAINT:  CVA   History of Present Illness:  Patient is encephalopathic and/or intubated. Therefore history has been obtained from chart review.  87 year old female with PMH as below, which is significant for atrial fibrillation. She is not on anticoagulation due to frequent falls. Medical history also includes dementia, HTN, hypothyroid. At baseline she is likely not oriented to time or place per son. Her last known well time is not entirely certain. Sometime between 1330 and 1530 on 9/13. She was then found on the floor with left sided weakness at around 1815 and well as laceration to the left upper extremity. EMS was called and noted twitching movements of the right upper extremity, which improved with versed. She presented to [facility] as a code stroke. Imaging consistent with R MCA acute ischemia. TPA was not given because of uncertain LKW time. She was taken to IR where TICI 3 revascularization was achieved. Post-procedurally she was extubated, but promptly decompensated, requiring re-intubation. She was transferred to ICU for ongoing care. PCCM consulted.   Pertinent  Medical History   has a past medical history of Anemia, Cancer ([place]), Hypertension, Hyponatremia, Hypothyroidism, LVH (left ventricular hypertrophy), PAF (paroxysmal atrial fibrillation) ([place]), Shingles, and Thrombocytopenia ([place]).   Significant Hospital Events: Including procedures, antibiotic start and stop dates in addition to other pertinent events   9/13 admit R MCA stroke. To IR with TICI3. Re-intubated in PACU.   Imaging: CT head > No evidence of acute large vascular territory infarct or acute hemorrhage. CT angio head > Occlusion of the proximal right M1 MCA. Large area of right MCA territory penumbra (79 mL) without evidence of core infarct. 3. Age-indeterminate  occlusion of the left vertebral artery at its origin with non opacification in the neck. 4. Severe left (difficult to quantify but likely greater than 80%) and approximately 70% right proximal ICA stenosis in the neck due to atherosclerosis.   Interim History / Subjective:  Sedation off.  Opening eyes to noxious stimuli but not awake enough for SBT yet.  Objective   Blood pressure 111/85, pulse (!) 118, temperature 98 F (36.7 C), temperature source Oral, resp. rate 15, height 4' 11.5" (1.511 m), weight 45.4 kg, SpO2 100 %.    Vent Mode: PSV;CPAP FiO2 (%):  [30 %-100 %] 30 % Set Rate:  [16 bmp-20 bmp] 16 bmp Vt Set:  [350 mL] 350 mL PEEP:  [5 cmH20] 5 cmH20 Pressure Support:  [10 cmH20] 10 cmH20 Plateau Pressure:  [14 cmH20-18 cmH20] 15 cmH20   Intake/Output Summary (Last 24 hours) at 06/25/2021 0842 Last data filed at 06/25/2021 0800 Gross per 24 hour  Intake 1724.95 ml  Output 150 ml  Net 1574.95 ml   Filed Weights   06/24/21 1900  Weight: 45.4 kg    Examination: General: frail elderly appearing female on vent in NAD HENT: [place]/AT, PERRL, no JVD. Poor dentition.  Lungs: CTAB, normal effort. Cardiovascular: IRIR, tachy, no MRG.  Abdomen: Soft, non-distended Extremities: No acute deformity  Neuro: Opens eyes to voice, not awake enough to follow commands.  Resolved Hospital Problem list     Assessment & Plan:   R MCA CVA: likely cardioembolic due to AF not on AC. S/p IR revascularization (TICI 3).  - Neurology primary - Stroke workup pending - F/u MRI, echo  Acute hypoxemic respiratory failure: dropped  sats after extubation in PACU and required reintubation - Hold sedation to facilitate SBT this AM - Hopeful for extubation later this AM if wakes up enough - Routine bronchial hygiene  Atrial fibrillation with RVR: not on AC at baseline due to frequent falls.  - Tele monitoring - Continue amiodarone, Metoprolol - Metoprolol - Accept rates up to 120  Dementia -  Supportive care  Hypothyroid - continue home synthroid  Hypokalemia - 4 runs K - Follow BMP  Best Practice (right click and "Reselect all SmartList Selections" daily)   Diet/type: NPO DVT prophylaxis: other GI prophylaxis: PPI Lines: N/A Foley:  N/A Code Status:  full code Last date of multidisciplinary goals of care discussion '[ ]'$   Critical care time: 30 minutes   Montey Hora, PA - [facility] [place] [facility] For pager details, please see AMION or use Epic chat  After 1900, please call [place] for cross coverage needs 06/25/2021, 8:49 AM

## 2021-06-25 NOTE — Progress Notes (Signed)
SLP Cancellation Note  Patient Details Name: Dominique [name] MRN: [id number] DOB: [birth year]   Cancelled treatment:        Reason treat/eval not completed: Pt orally intubated. SLP will follow for pt readiness.   Dewitt Rota, SLP-Student    Dewitt Rota 06/25/2021, 12:40 PM

## 2021-06-25 NOTE — Progress Notes (Signed)
PT Cancellation Note  Patient Details Name: CORIE SCHAECHER MRN: [id number] DOB: Aug 15, 1933   Cancelled Treatment:    Reason Eval/Treat Not Completed: Patient not medically ready. Pt on vent and going to MRI. Acute PT to return as able to complete PT eval as appropriate.  Kittie Plater, PT, DPT Acute Rehabilitation Services Pager #: [phone] Office #: [phone]    Berline Lopes 06/25/2021, 10:34 AM

## 2021-06-25 NOTE — Progress Notes (Signed)
eLink Physician-Brief Progress Note Patient Name: Dominique [name] DOB: 08/26/1933 MRN: [id number]   Date of Service  06/25/2021  HPI/Events of Note  Patient with softening BP.  eICU Interventions  Phenylephrine gtt ordered targeting MAP > 65 and SBP 120-140.        Kerry Kass Teal Raben 06/25/2021, 1:54 AM

## 2021-06-25 NOTE — Anesthesia Postprocedure Evaluation (Signed)
Anesthesia Post Note  Patient: Dominique [name]  Procedure(s) Performed: IR WITH ANESTHESIA     Patient location during evaluation: SICU Anesthesia Type: General Level of consciousness: sedated and patient remains intubated per anesthesia plan Pain management: pain level controlled Vital Signs Assessment: post-procedure vital signs reviewed and stable Respiratory status: patient on ventilator - see flowsheet for VS and patient remains intubated per anesthesia plan Cardiovascular status: unstable Anesthetic complications: no   No notable events documented.  Last Vitals:  Vitals:   06/24/21 2222 06/24/21 2307  BP: 110/84 112/61  Pulse: (!) 108 (!) 125  Resp: (!) 21 (!) 22  Temp:    SpO2: 100% 100%    Last Pain:  Vitals:   06/24/21 1930  TempSrc: Oral                 Nolon Nations

## 2021-06-26 ENCOUNTER — Inpatient Hospital Stay (HOSPITAL_COMMUNITY): Payer: Medicare Other

## 2021-06-26 DIAGNOSIS — J9601 Acute respiratory failure with hypoxia: Secondary | ICD-10-CM | POA: Diagnosis not present

## 2021-06-26 DIAGNOSIS — R4182 Altered mental status, unspecified: Secondary | ICD-10-CM

## 2021-06-26 DIAGNOSIS — I63511 Cerebral infarction due to unspecified occlusion or stenosis of right middle cerebral artery: Secondary | ICD-10-CM | POA: Diagnosis not present

## 2021-06-26 LAB — BASIC METABOLIC PANEL
Anion gap: 9 (ref 5–15)
BUN: 18 mg/dL (ref 8–23)
CO2: 22 mmol/L (ref 22–32)
Calcium: 8.2 mg/dL — ABNORMAL LOW (ref 8.9–10.3)
Chloride: 105 mmol/L (ref 98–111)
Creatinine, Ser: 0.78 mg/dL (ref 0.44–1.00)
GFR, Estimated: >60 mL/min (ref >60)
Glucose, Bld: 100 mg/dL — ABNORMAL HIGH (ref 70–99)
Potassium: 3.8 mmol/L (ref 3.5–5.1)
Sodium: 136 mmol/L (ref 135–145)

## 2021-06-26 LAB — GLUCOSE, CAPILLARY
Glucose-Capillary: 86 mg/dL (ref 70–99)
Glucose-Capillary: 82 mg/dL (ref 70–99)
Glucose-Capillary: 95 mg/dL (ref 70–99)

## 2021-06-26 LAB — MAGNESIUM: Magnesium: 2.1 mg/dL (ref 1.7–2.4)

## 2021-06-26 MED ORDER — CHLORHEXIDINE GLUCONATE 0.12 % MT SOLN: 15.0000 mL | Freq: Two times a day (BID) | OROMUCOSAL | Status: DC

## 2021-06-26 MED ORDER — ASPIRIN 300 MG RE SUPP
300.0000 mg | Freq: Every day | RECTAL | Status: DC
  Administered 2021-06-26 – 2021-06-27 (×2): 300 mg via RECTAL
  Filled 2021-06-26 (×2): qty 1

## 2021-06-26 MED ORDER — ORAL CARE MOUTH RINSE
15.0000 mL | Freq: Two times a day (BID) | OROMUCOSAL | Status: DC
  Administered 2021-06-26 – 2021-07-14 (×39): 15 mL via OROMUCOSAL

## 2021-06-26 MED ORDER — DILTIAZEM HCL 25 MG/5ML IV SOLN
10.0000 mg | INTRAVENOUS | Status: AC
  Administered 2021-06-26: 10 mg via INTRAVENOUS
  Filled 2021-06-26: qty 5

## 2021-06-26 MED ORDER — MAGNESIUM SULFATE 2 GM/50ML IV SOLN
2.0000 g | Freq: Once | INTRAVENOUS | Status: AC
  Administered 2021-06-26: 2 g via INTRAVENOUS
  Filled 2021-06-26: qty 50

## 2021-06-26 NOTE — Progress Notes (Signed)
NAME:  SHERRIDAN TROXELL, MRN:  [id number], DOB:  06/22/1933, LOS: 2 ADMISSION DATE:  06/24/2021, CONSULTATION DATE:  9/13 REFERRING MD:  Dr Curly Shores, CHIEF COMPLAINT:  CVA   History of Present Illness:  Patient is encephalopathic and/or intubated. Therefore history has been obtained from chart review.  87 year old female with PMH as below, which is significant for atrial fibrillation. She is not on anticoagulation due to frequent falls. Medical history also includes dementia, HTN, hypothyroid. At baseline she is likely not oriented to time or place per son. Her last known well time is not entirely certain. Sometime between 1330 and 1530 on 9/13. She was then found on the floor with left sided weakness at around 1815 and well as laceration to the left upper extremity. EMS was called and noted twitching movements of the right upper extremity, which improved with versed. She presented to [facility] as a code stroke. Imaging consistent with R MCA acute ischemia. TPA was not given because of uncertain LKW time. She was taken to IR where TICI 3 revascularization was achieved. Post-procedurally she was extubated, but promptly decompensated, requiring re-intubation. She was transferred to ICU for ongoing care. PCCM consulted.   Pertinent  Medical History   has a past medical history of Anemia, Cancer ([place]), Hypertension, Hyponatremia, Hypothyroidism, LVH (left ventricular hypertrophy), PAF (paroxysmal atrial fibrillation) ([place]), Shingles, and Thrombocytopenia ([place]).   Significant Hospital Events: Including procedures, antibiotic start and stop dates in addition to other pertinent events   9/13 admit R MCA stroke. To IR with TICI3. Re-intubated in PACU.   Imaging: CT head > No evidence of acute large vascular territory infarct or acute hemorrhage. CT angio head > Occlusion of the proximal right M1 MCA. Large area of right MCA territory penumbra (79 mL) without evidence of core infarct. 3. Age-indeterminate  occlusion of the left vertebral artery at its origin with non opacification in the neck. 4. Severe left (difficult to quantify but likely greater than 80%) and approximately 70% right proximal ICA stenosis in the neck due to atherosclerosis. Echo 9/14 > EF 60-65%  Interim History / Subjective:  Extubated yesterday successfully. DNR established.  Objective   Blood pressure (!) 149/88, pulse 73, temperature 98.3 F (36.8 C), temperature source Axillary, resp. rate 13, height 4' 11.5" (1.511 m), weight 45.4 kg, SpO2 100 %.    Vent Mode: PSV;CPAP FiO2 (%):  [28 %-30 %] 28 % PEEP:  [5 cmH20] 5 cmH20 Pressure Support:  [5 cmH20-10 cmH20] 5 cmH20   Intake/Output Summary (Last 24 hours) at 06/26/2021 0755 Last data filed at 06/26/2021 0700 Gross per 24 hour  Intake 1153.96 ml  Output 300 ml  Net 853.96 ml    Filed Weights   06/24/21 1900  Weight: 45.4 kg    Examination: General: frail elderly appearing female, in NAD HENT: [place]/AT, EOMI. Lungs: CTAB, normal effort. Cardiovascular: RRR, no MRG.  Abdomen: Soft, non-distended Extremities: No acute deformity  Neuro: Answers basic questions appropriately.  Moves right arm and right leg, weak on left  Resolved Hospital Problem list     Assessment & Plan:   R MCA CVA: likely cardioembolic due to AF not on AC. S/p IR revascularization (TICI 3). MRI with superimposed petechial hemorrhage in right basal ganglia, and punctate acute cortical infarct medial posterior left frontal lobe. - Stroke management per neuro  Atrial fibrillation with RVR: not on AC at baseline due to frequent falls.  Appears resolved and currently NSR on amio maintenance gtt. -  Tele monitoring - Continue amiodarone and transition to PO once able, Metoprolol  Dementia - Supportive care  Hypothyroid - continue home synthroid  Hypokalemia - s/p repletion - Follow BMP  DNR Status - Continue supportive care  OK to transfer out of ICU from our standpoint and  nothing else from CCM end.  Best Practice (right click and "Reselect all SmartList Selections" daily)   Diet/type: NPO. SLP eval pending DVT prophylaxis: SCD GI prophylaxis: PPI Lines: N/A Foley:  N/A Code Status:  DNR Last date of multidisciplinary goals of care discussion '[ ]'$   Montey Hora, [place] [facility] For pager details, please see AMION or use Epic chat  After 1900, please call Ken [place] for cross coverage needs 06/26/2021, 7:55 AM

## 2021-06-26 NOTE — Procedures (Signed)
Patient Name: Dominique [name]  MRN: [id number]  Epilepsy Attending: Lora Havens  Referring Physician/Provider: Dr Antony Contras Date: 06/26/2021 Duration: 22.46 mins  Patient history: 87 year old female with right MCA infarct.  EEG to evaluate for seizures.  Level of alertness: Awake  AEDs during EEG study: None  Technical aspects: This EEG study was done with scalp electrodes positioned according to the 10-20 International system of electrode placement. Electrical activity was acquired at a sampling rate of '500Hz'$  and reviewed with a high frequency filter of '70Hz'$  and a low frequency filter of '1Hz'$ . EEG data were recorded continuously and digitally stored.   Description: No clear posterior dominant rhythm was seen. EEG showed continuous generalized and lateralized right hemisphere 3 to 5 Hz theta-delta slowing which at times appeared sharply contoured and rhythmic in the right hemisphere. Hyperventilation and photic stimulation were not performed.     ABNORMALITY - Continuous slow, generalized and lateralized right hemisphere  IMPRESSION: This study is suggestive of focal dysfunction in right hemisphere likely secondary to underlying stroke.  Additionally there is moderate diffuse encephalopathy, nonspecific etiology.  No seizures or definite epileptiform discharges were seen throughout the recording.  If suspicion for ictal- interictal activity remains a concern, a prolonged study can be considered.   [name] [name]

## 2021-06-26 NOTE — Progress Notes (Signed)
STROKE TEAM PROGRESS NOTE   INTERVAL HISTORY Patient was extubated yesterday afternoon and has done well from respiratory standpoint and breathing well.  She is drowsy but arousable.  Follows simple commands on the right side but has left hemip paresis.Marland Kitchen  MRI scan shows slight hemorrhagic right basal ganglia infarct and small right cortical infarct.  She remains in atrial fibrillation and is on amiodarone drip for rate control..  Blood pressure adequately controlled.  She failed swallow eval by speech therapy this morning.  No family at bedside. Vitals:   06/26/21 1300 06/26/21 1400 06/26/21 1500 06/26/21 1600  BP: (!) 134/52 110/78 121/90 113/79  Pulse: 80 (!) 153 (!) 155 (!) 153  Resp: '13 13 12 20  '$ Temp:      TempSrc:      SpO2: 95% 95% 96% 96%  Weight:      Height:       CBC:  Recent Labs  Lab 06/24/21 1914 06/24/21 1919 06/24/21 2301 06/25/21 0402  WBC 14.8*  --   --   --   NEUTROABS 12.8*  --   --   --   HGB 11.5*   < > 10.9* 10.9*  HCT 35.6*   < > 32.0* 32.0*  MCV 104.4*  --   --   --   PLT 344  --   --   --    < > = values in this interval not displayed.   Basic Metabolic Panel:  Recent Labs  Lab 06/24/21 1914 06/24/21 1919 06/24/21 2301 06/25/21 0402  NA 135 136 140 139  K 3.6 3.6 3.3* 3.3*  CL 101 101  --   --   CO2 24  --   --   --   GLUCOSE 140* 135*  --   --   BUN 12 12  --   --   CREATININE 0.71 0.60  --   --   CALCIUM 8.9  --   --   --     Lipid Panel:  Recent Labs  Lab 06/25/21 0538  CHOL 75  TRIG 46  50  HDL 49  CHOLHDL 1.5  VLDL 9  LDLCALC 17    HgbA1c:  Recent Labs  Lab 06/25/21 0538  HGBA1C 5.3   Urine Drug Screen: No results for input(s): LABOPIA, COCAINSCRNUR, LABBENZ, AMPHETMU, THCU, LABBARB in the last 168 hours.  Alcohol Level  Recent Labs  Lab 06/24/21 1914  ETH <10    IMAGING past 24 hours CT HEAD WO CONTRAST (5MM)  Result Date: 06/26/2021 CLINICAL DATA:  Stroke follow-up. Postop right MCA thrombectomy 06/24/2021  EXAM: CT HEAD WITHOUT CONTRAST TECHNIQUE: Contiguous axial images were obtained from the base of the skull through the vertex without intravenous contrast. COMPARISON:  CT head 06/24/2021.  MRI head 06/25/2021 FINDINGS: Brain: Acute infarct involving much of the right basal ganglia is best seen on diffusion-weighted imaging. There is mild local mass-effect. There is mild petechial hemorrhage in the right putamen, similar in appearance to the recent MRI but not present on the preprocedure CT. Generalized atrophy, most prominent in the temporal lobes bilaterally. Chronic microvascular ischemic changes throughout the white matter. Diffusion-weighted imaging demonstrated small areas of infarction in the right frontal and parietal cortex. CT hypodensity in the right inferior frontal lobe corresponds to this area. The right parietal infarct not well seen by CT. Vascular: Negative for hyperdense vessel Skull: Negative Sinuses/Orbits: Paranasal sinuses clear. Bilateral cataract extraction Other: None IMPRESSION: Acute right MCA infarct based on prior  diffusion-weighted imaging Petechial hemorrhage in the right putamen compatible with mild hemorrhage involving infarct basal ganglia. This is unchanged from the MRI yesterday. Additional areas of acute infarct in the right frontal and parietal lobe best seen on diffusion-weighted imaging. Electronically Signed   By: Franchot Gallo M.D.   On: 06/26/2021 07:15   EEG adult  Result Date: 06/26/2021 Lora Havens, MD     06/26/2021  2:39 PM Patient Name: Dominique [name] MRN: [id number] Epilepsy Attending: Lora Havens Referring Physician/Provider: Dr Antony Contras Date: 06/26/2021 Duration: 22.46 mins Patient history: 87 year old female with right MCA infarct.  EEG to evaluate for seizures. Level of alertness: Awake AEDs during EEG study: None Technical aspects: This EEG study was done with scalp electrodes positioned according to the 10-20 International system of electrode  placement. Electrical activity was acquired at a sampling rate of '500Hz'$  and reviewed with a high frequency filter of '70Hz'$  and a low frequency filter of '1Hz'$ . EEG data were recorded continuously and digitally stored. Description: No clear posterior dominant rhythm was seen. EEG showed continuous generalized and lateralized right hemisphere 3 to 5 Hz theta-delta slowing which at times appeared sharply contoured and rhythmic in the right hemisphere. Hyperventilation and photic stimulation were not performed.   ABNORMALITY - Continuous slow, generalized and lateralized right hemisphere IMPRESSION: This study is suggestive of focal dysfunction in right hemisphere likely secondary to underlying stroke.  Additionally there is moderate diffuse encephalopathy, nonspecific etiology.  No seizures or definite epileptiform discharges were seen throughout the recording. If suspicion for ictal- interictal activity remains a concern, a prolonged study can be considered. [place]    PHYSICAL EXAM  Mental Status: Patient is drowsy but can be easily aroused.  She follows simple commands.Eyes closed but can be aroused.  Follows simple midline and right-sided commands. Cranial Nerves: II: Visual Fields are no blink to threat left greater than right though she does appear to regard examiner at times. Pupils are equal, round, and pinpoint in the bright light  III,IV, VI: EOMI without ptosis or diploplia.  V: unable to assess  VII: prior report of left facial droop, patient now intubated  VIII: hearing is intact to voice Remainder Unable to assess secondary to patient's mental status  Motor: She has a dense left hemiplegia 1/5 strength and tone is flaccid in the LUE. Trace extensor posturing in the left upper extremity, triple flexion in the left lower extremity.  she has purposeful movements at right side Localizes with the right upper extremity and spontaneously moves the right lower extremity 2/5 and briskly  withdraws Sensory: She is equally responsive to noxious stimulation in all 4 extremities on grimace Cerebellar: Unable to assess secondary to patient's mental status     ASSESSMENT/PLAN Dominique [name] is a 87 y.o. female with history of  y.o. female with a past medical history significant for atrial fibrillation not on anticoagulation due to frequent falls, baseline dementia, hypertension, hypothyroidism, uterine cancer, prior thrombocytopenia resolved more recently.   History is primarily provided by the son and EMS as well as chart review.   Son notes that she was seen by her regular nurse practitioner the day before at which time she was essentially at her baseline that she continues to struggle with significant right upper extremity pain for which some cortisone and muscle relaxer at a low dose was recommended.  There are varying reports of when she was last seen well today and what her baseline is from EMS,  with a possible last known well of 1:30 or 3:30 PM and discovery of the patient down with left-sided weakness at 6:15 PM with a significant laceration on the left upper extremity.  EMS was concerned about twitching/tremoring movements of the right upper extremity and route and did give her 2.5 mg of Versed which alleviated these movements.  Her blood pressures for them were 140s over 100s with heart rates in the 60s to 70s.  She did speak a few words to EMS initially such as saying no when she was asked if she was in pain, but then became nonverbal   At baseline the patient is continent and walks with a walker though she does have frequent falls for which reason she has been taken off of her anticoagulation.  He does not think she would be oriented to month or year at baseline and she is typically not oriented to place either, although she is otherwise conversant and is able to feed herself.  He denies any recent medical concerns or new medications other than the cortisone/muscle relaxer  previously mentioned and notes she does take aspirin.   On chart review she had multiple pelvic fractures and right humeral fracture in December 2020 which required significant rehabilitation.  She was taken for mechanical thrombectomy of right M1, then right M2 with TICI 3 recanalization on 06/24/21. MRI brain today shows Acute right MCA territory infarcts within the right cerebral hemisphere and right basal ganglia,       Stroke:  right mca infarct embolic secondary to right MCA occlusion from atrial fibrillation not on anticoagulation due to fall risk s/p mechanical thrombectomy code Stroke  CT head No acute abnormality.  Small vessel disease. Atrophy.  ASPECTS 10.     CTA head & neck  1. Occlusion of the proximal right M1 MCA. 2. Large area of right MCA territory penumbra (79 mL) without evidence of core infarct. 3. 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 4. Severe left (difficult to quantify but likely greater than 80%) and approximately 70% right proximal ICA stenosis in the neck due to atherosclerosis. 5. Small left A1 ACA, poorly opacified proximally. 6. Small to moderate bilateral pleural effusions. 7. Debris within the trachea, placing the patient at risk for aspiration. 8. Partially visualized fibrotic change in the lung apices, better     MRI   1. Acute right MCA territory infarcts within the right cerebral hemisphere and right basal ganglia, as described. Superimposed petechial hemorrhage within the right basal ganglia (Hiedelberg classifications, HI2). Mild mass effect related to the right basal ganglia infarct with subtle partial effacement of the right lateral ventricle. No midline shift. 2. Punctate acute cortical infarct within the medial posterior left frontal lobe. 3. Background mild chronic small vessel ischemic changes within the cerebral white matter. 4. Small chronic lacunar infarct within  right cerebellar hemisphere, a new finding as compared to the brain MRI of 01/26/2019. 5. Moderate generalized cerebral atrophy. Comparatively mild cerebellar atrophy. MRA  post thrombectomy: 1. The M1 right middle cerebral artery remains patent status post mechanical thrombectomy performed 06/24/2021. 2. Atherosclerotic irregularity of the M2 and more distal right MCA vessels. Most notably, a moderate stenosis is present within a superior division proximal M2 right MCA vessel. 3. Enhancement within the mid-to-distal V4 left vertebral artery, likely due to retrograde flow (given unchanged apparent occlusion of this vessel more proximally).  2D Echo mild concentric hypertrophy with ejection fraction of 60 to  65%.  No wall motion abnormalities.   LDL 17 HgbA1c 5.3 VTE prophylaxis - scd    Diet   Diet NPO time specified   No antithrombotic prior to admission, now on No antithrombotic.   Therapy recommendations:  pending Disposition:     Hypertension Home meds:  lopressor Stable Permissive hypertension (OK if < 220/120) but gradually normalize in 5-7 days Long-term BP goal normotensive  Hyperlipidemia Home meds:  zocor '20mg'$ ,  resumed in hospital LDL 17, goal < 70  Continue statin at discharge  D  CBGs Recent Labs    06/26/21 0433 06/26/21 0805 06/26/21 1216  GLUCAP 86 82 95    SSI  Other Stroke Risk Factors  Advanced Age >/= 16   Hx stroke/TIA  Uterine cancer  Other Active Problems    Hospital day # 2 Continue close neurological monitoring and strict blood pressure control with systolic blood pressure goal between 120-160.  Mobilize out of bed.  Therapy consults.  Start aspirin but we will hold off on anticoagulation due to petechial hemorrhage for 3 to 5 days. .Son has agreed to DNR.  Repeat swallow eval tomorrow and if patient fails s may need feeding tube.3 Discussed with Dr. Tacy Learn critical care medicine. This patient is critically ill and at significant risk  of neurological worsening, death and care requires constant monitoring of vital signs, hemodynamics,respiratory and cardiac monitoring, extensive review of multiple databases, frequent neurological assessment, discussion with family, other specialists and medical decision making of high complexity.I have made any additions or clarifications directly to the above note.This critical care time does not reflect procedure time, or teaching time or supervisory time of PA/NP/Med Resident etc but could involve care discussion time.  I spent 45 minutes of neurocritical care time  in the care of  this patient.     Antony Contras, MD Medical Director [facility] St[facility] Pager: [phone] 06/26/2021 4:27 PM     To contact Stroke Continuity provider, please refer to [email or web address] After hours, contact General Neurology

## 2021-06-26 NOTE — Progress Notes (Signed)
PCCM Interval Progress Note  Called for recurrent A.fib with RVR, rates in 130's.  Amio at '30mg'$ /hr ongoing. Lopressor already ordered but has yet to be given.  Continue Amio. Give lopressor as ordered as long as BP allows.   Montey Hora, [place] [facility] For pager details, please see AMION or use Epic chat  After 1900, please call [facility] for cross coverage needs 06/26/2021, 2:45 PM

## 2021-06-26 NOTE — Evaluation (Signed)
Clinical/Bedside Swallow Evaluation Patient Details  Name: Dominique [name] MRN: [id number] Date of Birth: 1933-01-26  Today's Date: 06/26/2021 Time: SLP Start Time (ACUTE ONLY): 0959 SLP Stop Time (ACUTE ONLY): 1008 SLP Time Calculation (min) (ACUTE ONLY): 9 min  Past Medical History:  Past Medical History:  Diagnosis Date   Anemia    Cancer ([place])    uterine   Hypertension    Hyponatremia    Hypothyroidism    LVH (left ventricular hypertrophy)    PAF (paroxysmal atrial fibrillation) ([place])    a. initial dx 2012 in setting of acute illness. b. began to recur 09/2017; started on amiodarone 11/2017.   Shingles    Thrombocytopenia ([facility])    Past Surgical History:  Past Surgical History:  Procedure Laterality Date   ABDOMINAL HYSTERECTOMY     IR CT HEAD LTD  06/24/2021   IR PERCUTANEOUS ART THROMBECTOMY/INFUSION INTRACRANIAL INC DIAG ANGIO  06/24/2021   IR US GUIDE VASC ACCESS RIGHT  06/24/2021   JOINT REPLACEMENT Bilateral    knees   RADIOLOGY WITH ANESTHESIA N/A 06/24/2021   Procedure: IR WITH ANESTHESIA;  Surgeon: Luanne Bras, MD;  Location: [place];  Service: Radiology;  Laterality: N/A;   HPI:  87 yo F with PMHx significant for atrial fibrillation, baseline dementia, HTN, hypothyroidism, uterine cancer, and prior thrombocytopenia. Pt presented to ED with left-sided weakness and signifcant laceration of left upper extremity. Imaging revealed acute R MCA infarct involving right basal ganglia with mild petechial hemorrhage in the right putamen. Intubated 9/13 for angiogram with one way extubation 9/14 and swallow assessment ordered as pt is awake and following some commands.    Assessment / Plan / Recommendation  Clinical Impression  Pt was seen for a bedside swallow evaluation. Pt awake and upright in chair but very drowsy/lethargic with eyes closed throughout. SLP observed left lower facial droop and pt would not consent to remaining portions of oral mechanism exam. During oral  care, SLP observed reduced facial and lingual strength and ROM. Pt presented with wet/gurgly respirations after oral care exhibited reduced management of secretions. SLP trialed thin liquid from teaspoon. Oral phase c/b impaired labial seal resulting in anterior oral spillage. Pt exhibited immediate and delayed audible respirations, throat clearing, coughing, and slight increase in respiratory rate (12 to 22). Cough largely unproductive. SLP discontinued BSE and recommends NPO at this time. Discussed with Dr Leonie Man and plan is to continue NPO and re-evaluate over next few days initiate po's versus comfort care. (Simultaneous filing. User may not have seen previous data.) SLP Visit Diagnosis: Dysphagia, unspecified (R13.10)    Aspiration Risk  Severe aspiration risk    Diet Recommendation NPO   Medication Administration:  (TBA)    Other  Recommendations Oral Care Recommendations: Oral care QID    Recommendations for follow up therapy are one component of a multi-disciplinary discharge planning process, led by the attending physician.  Recommendations may be updated based on patient status, additional functional criteria and insurance authorization.  Follow up Recommendations Skilled Nursing facility      Frequency and Duration min 1 x/week  1 week       Prognosis Prognosis for Safe Diet Advancement: Guarded Barriers to Reach Goals: Cognitive deficits;Motivation;Severity of deficits      Swallow Study   General HPI: 87 yo F with PMHx significant for atrial fibrillation, baseline dementia, HTN, hypothyroidism, uterine cancer, and prior thrombocytopenia. Pt presented to ED with left-sided weakness and signifcant laceration of left upper extremity. Imaging revealed acute  R MCA infarct involving right basal ganglia with mild petechial hemorrhage in the right putamen. Intubated 9/13 for angiogram with one way extubation 9/14 and swallow assessment ordered as pt is awake and following some  commands. Type of Study: Bedside Swallow Evaluation Previous Swallow Assessment: n/a Diet Prior to this Study: NPO Temperature Spikes Noted: No Respiratory Status: Nasal cannula History of Recent Intubation: Yes Length of Intubations (days): 1 days Date extubated: 06/25/21 Behavior/Cognition: Lethargic/Drowsy;Uncooperative Oral Cavity Assessment: Dry Oral Care Completed by SLP: Yes Oral Cavity - Dentition: Missing dentition;Poor condition Self-Feeding Abilities: Total assist Patient Positioning: Upright in chair Baseline Vocal Quality: Low vocal intensity;Hoarse Volitional Cough: Weak;Wet Volitional Swallow: Unable to elicit    Oral/Motor/Sensory Function Overall Oral Motor/Sensory Function: Moderate impairment Facial ROM: Reduced left;Reduced right Facial Symmetry: Abnormal symmetry left Facial Strength: Reduced right;Reduced left Lingual ROM: Reduced left;Reduced right Lingual Strength: Reduced   Ice Chips Ice chips: Not tested   Thin Liquid Thin Liquid: Impaired Presentation: Spoon Oral Phase Impairments: Reduced labial seal;Poor awareness of bolus;Reduced lingual movement/coordination Oral Phase Functional Implications: Right anterior spillage;Oral holding Pharyngeal  Phase Impairments: Suspected delayed Swallow;Decreased hyoid-laryngeal movement;Wet Vocal Quality;Other (comments);Cough - Immediate;Cough - Delayed;Change in Vital Signs;Throat Clearing - Immediate;Throat Clearing - Delayed (Audible respirations)    Nectar Thick Nectar Thick Liquid: Not tested   Honey Thick Honey Thick Liquid: Not tested   Puree Puree: Not tested   Solid    Dewitt Rota, SLP-Student  Solid: Not tested      Dewitt Rota 06/26/2021,12:44 PM

## 2021-06-26 NOTE — Progress Notes (Signed)
EEG complete - results pending 

## 2021-06-26 NOTE — Evaluation (Signed)
Occupational Therapy Evaluation Patient Details Name: Dominique [name] MRN: [id number] DOB: January 22, 1933 Today's Date: 06/26/2021   History of Present Illness Dominique [name] is a 87 y.o. female found down at facility, [id number] right MCA territory infarcts within the right cerebral  hemisphere and right basal ganglia. Superimposed  petechial hemorrhage within the right basal ganglia. Mild mass effect related to the right basal ganglia infarct with subtle partial effacement of the right lateral  ventricle. Punctate acute cortical infarct within the medial posterior left frontal lobe.Background mild chronic small vessel ischemic changes within the cerebral white matter.Small chronic lacunar infarct within right cerebellar hemisphere, a new finding as compared to the brain MRI of 01/26/2019.Moderate generalized cerebral atrophy. Comparatively mild cerebellar atrophy. PHMx:Artial fibrillation not on anticoagulation due to frequent falls, baseline dementia, hypertension, hypothyroidism, uterine cancer, prior thrombocytopenia resolved more recently. significant right upper extremity pain,multiple pelvic fractures and right humeral fracture in December 2020 which required significant rehabilitation.   Clinical Impression   This 87 yo female admitted with above presents to acute OT with PLOF of being ambulatory and able to feed herself. Currently she is total A for all basic ADLs and +2 for OOB to chair. She will continue to benefit from acute OT with follow up at SNF.      Recommendations for follow up therapy are one component of a multi-disciplinary discharge planning process, led by the attending physician.  Recommendations may be updated based on patient status, additional functional criteria and insurance authorization.   Follow Up Recommendations  SNF;Supervision/Assistance - 24 hour    Equipment Recommendations  Other (comment) (TBD next venue)       Precautions / Restrictions  Precautions Precautions: Fall Precaution Comments: hx of dementia Restrictions Weight Bearing Restrictions: No      Mobility Bed Mobility Overal bed mobility: Needs Assistance Bed Mobility: Rolling;Sidelying to Sit Rolling: Max assist Sidelying to sit: Max assist       General bed mobility comments: maxA to roll L/R. MaxA to bring LEs off and trunk elevation. patient able to initiate bringing LEs off bed    Transfers Overall transfer level: Needs assistance Equipment used: 2 person hand held assist Transfers: Sit to/from [facility] Sit to Stand: Max assist;+2 physical assistance;+2 safety/equipment Stand pivot transfers: Max assist;+2 physical assistance;+2 safety/equipment       General transfer comment: maxA+2 to stand from EOB with L knee blocked. Patient able to step with R foot towards recliner on R. Overall maxA+2 to complete for balance and advancing L LE    Balance Overall balance assessment: Needs assistance Sitting-balance support: No upper extremity supported;Feet supported Sitting balance-Leahy Scale: Poor Sitting balance - Comments: requires up to maxA to maintain sitting balance at EOB.   Standing balance support: Bilateral upper extremity supported;During functional activity Standing balance-Leahy Scale: Poor Standing balance comment: maxA+2 to maintain standing                           ADL either performed or assessed with clinical judgement   ADL Overall ADL's : Needs assistance/impaired Eating/Feeding: NPO   Grooming: Wash/dry face;Bed level Grooming Details (indicate cue type and reason): Total A with RUE (limited shoulder flexion and dementia) Upper Body Bathing: Total assistance;Bed level   Lower Body Bathing: Total assistance;Bed level   Upper Body Dressing : Total assistance;Bed level   Lower Body Dressing: Total assistance;Bed level   Toilet Transfer: Maximal assistance;+2 for physical  assistance;Stand-pivot Toilet Transfer Details (  indicate cue type and reason): Bil HHA; from bed>recliner going to pt's right Toileting- Clothing Manipulation and Hygiene: Total assistance Toileting - Clothing Manipulation Details (indicate cue type and reason): max A+2 sit<>stand             Vision Baseline Vision/History: 1 Wears glasses Ability to See in Adequate Light:  (unknown)              Pertinent Vitals/Pain Pain Assessment: Faces Faces Pain Scale: Hurts little more Pain Location: "all over my body" Pain Descriptors / Indicators: Grimacing Pain Intervention(s): Monitored during session;Repositioned     Hand Dominance Right   Extremity/Trunk Assessment Upper Extremity Assessment Upper Extremity Assessment: RUE deficits/detail;LUE deficits/detail;Generalized weakness RUE Deficits / Details: Hx of humeral fx 2020,limited A/PROM shoulder RUE Coordination: decreased fine motor;decreased gross motor LUE Deficits / Details: Flaccid LUE Coordination: decreased fine motor;decreased gross motor   Lower Extremity Assessment Lower Extremity Assessment: Generalized weakness;LLE deficits/detail;Difficult to assess due to impaired cognition LLE Deficits / Details: difficult to fully assess due to impaired cognition. Difficult to discern between spontaneous or purposeful movement vs tremors. Patient with fully body tremors throughout but stating "I'm cold"   Cervical / Trunk Assessment Cervical / Trunk Assessment: Kyphotic   Communication Communication Communication: Other (comment) (mumbled, garbled speech. Difficult to understand)   Cognition Arousal/Alertness: Lethargic Behavior During Therapy: Flat affect Overall Cognitive Status: History of cognitive impairments - at baseline                                 General Comments: hx of dementia, follows commands with increased time   General Comments  patient on 2L O2 [place] on arrival with spO2 99-100%.  Removed O2 for session with spO2 maintaining >95% throughout            [place] expects to be discharged to:: Skilled nursing facility                                        Prior Functioning/Environment Level of Independence: Needs assistance  Gait / Transfers Assistance Needed: ambulates with walker at facility but with frequent falls per chart review ADL's / Homemaking Assistance Needed: able to feed self per chart review            OT Problem List: Decreased strength;Decreased range of motion;Impaired balance (sitting and/or standing);Decreased activity tolerance;Impaired vision/perception;Impaired UE functional use;Impaired tone;Pain;Decreased coordination;Decreased cognition;Decreased safety awareness      OT Treatment/Interventions: Self-care/ADL training;DME and/or AE instruction;Patient/family education;Balance training;Therapeutic exercise;Therapeutic activities    OT Goals(Current goals can be found in the care plan section) Acute Rehab OT Goals Patient Stated Goal: did not state OT Goal Formulation: Patient unable to participate in goal setting Time For Goal Achievement: 07/10/21 Potential to Achieve Goals: Fair  OT Frequency: Min 2X/week           Co-evaluation PT/OT/SLP Co-Evaluation/Treatment: Yes Reason for Co-Treatment: For patient/therapist safety;To address functional/ADL transfers PT goals addressed during session: Mobility/safety with mobility;Balance;Strengthening/ROM OT goals addressed during session: Strengthening/ROM;ADL's and self-care      AM-PAC OT "6 Clicks" Daily Activity     Outcome Measure Help from another person eating meals?: Total Help from another person taking care of personal grooming?: Total Help from another person toileting, which includes using toliet, bedpan, or urinal?: Total Help from another person bathing (including washing,  rinsing, drying)?: Total Help from another person to put on  and taking off regular upper body clothing?: Total Help from another person to put on and taking off regular lower body clothing?: Total 6 Click Score: 6   End of Session Equipment Utilized During Treatment: Gait belt Nurse Communication: Mobility status  Activity Tolerance: Patient limited by lethargy Patient left: in chair;with call bell/phone within reach;with chair alarm set  OT Visit Diagnosis: Unsteadiness on feet (R26.81);Other abnormalities of gait and mobility (R26.89);Muscle weakness (generalized) (M62.81);Hemiplegia and hemiparesis;Low vision, both eyes (H54.2) Hemiplegia - Right/Left: Left Hemiplegia - dominant/non-dominant: Non-Dominant Hemiplegia - caused by: Cerebral infarction                [id number] OT Time Calculation (min): 31 min Charges:  OT General Charges $OT Visit: 1 Visit OT Evaluation $OT Eval Moderate Complexity: 1 Mod  [place] Circle, OTR/L Acute [employer] Pager [phone] Office [phone]    Almon Register 06/26/2021, 10:42 AM

## 2021-06-26 NOTE — Progress Notes (Signed)
[facility] Progress Note Patient Name: Dominique [name] DOB: Dec 03, 1932 MRN: [id number]   Date of Service  06/26/2021  HPI/Events of Note  Patient is on amiodarone 0.'5mg'$ /min dose, had received a push of metoprolol earlier, although remains in afib/rvr in 120's to 130's.  BP currently stable.  eICU Interventions  - ordered 2g mag IV - ordered diltiazem '10mg'$  iv push - checking BMP and mag     Intervention Category Major Interventions: Arrhythmia - evaluation and management  Tilden Dome 06/26/2021, 9:16 PM

## 2021-06-26 NOTE — Progress Notes (Signed)
1345: Patient with elevated heart rates, rhythm appears to be A Fib RVR.  MD made aware.  Patient on Amiodarone gtt at 30.  Verbal to give PRN lopressor order.  PA CCM aware PRN given.

## 2021-06-26 NOTE — Evaluation (Signed)
Physical Therapy Evaluation Patient Details Name: Dominique [name] MRN: [id number] DOB: [birth year] Today's Date: 06/26/2021  History of Present Illness  Delberta Pillion Losier is a 87 y.o. female found down at facility, [id number] right MCA territory infarcts within the right cerebral  hemisphere and right basal ganglia. Superimposed  petechial hemorrhage within the right basal ganglia. Mild mass effect related to the right basal ganglia infarct with subtle partial effacement of the right lateral  ventricle. Punctate acute cortical infarct within the medial posterior left frontal lobe.Background mild chronic small vessel ischemic changes within the cerebral white matter.Small chronic lacunar infarct within right cerebellar hemisphere, a new finding as compared to the brain MRI of 01/26/2019.Moderate generalized cerebral atrophy. Comparatively mild cerebellar atrophy. PHMx:Artial fibrillation not on anticoagulation due to frequent falls, baseline dementia, hypertension, hypothyroidism, uterine cancer, prior thrombocytopenia resolved more recently. significant right upper extremity pain,multiple pelvic fractures and right humeral fracture in December 2020 which required significant rehabilitation.  Clinical Impression  Patient from SNF where she resides. At baseline per chart review, patient ambulatory with RW however with frequent falls. Patient with hx of dementia and oriented to self. Patient presents with generalized weakness (L>R), L neglect, impaired cognition, decreased activity tolerance, and impaired functional mobility. Patient requires maxA for bed mobility and maxA+2 for transfers. Patient able to step towards R with R LE during stand pivot transfer. Patient will benefit from skilled PT services during acute stay to address listed deficits. Recommend return to SNF and initiate therapy services to assist with maximizing functional mobility and safety.        Recommendations for follow up therapy are one  component of a multi-disciplinary discharge planning process, led by the attending physician.  Recommendations may be updated based on patient status, additional functional criteria and insurance authorization.  Follow Up Recommendations SNF    Equipment Recommendations  None recommended by PT    Recommendations for Other Services       Precautions / Restrictions Precautions Precautions: Fall Precaution Comments: hx of dementia Restrictions Weight Bearing Restrictions: No      Mobility  Bed Mobility Overal bed mobility: Needs Assistance Bed Mobility: Rolling;Sidelying to Sit Rolling: Max assist Sidelying to sit: Max assist       General bed mobility comments: maxA to roll L/R. MaxA to bring LEs off and trunk elevation. patient able to initiate bringing LEs off bed    Transfers Overall transfer level: Needs assistance Equipment used: 2 person hand held assist Transfers: Sit to/from [facility] Sit to Stand: Max assist;+2 physical assistance;+2 safety/equipment Stand pivot transfers: Max assist;+2 physical assistance;+2 safety/equipment       General transfer comment: maxA+2 to stand from EOB with L knee blocked. Patient able to step with R foot towards recliner on R. Overall maxA+2 to complete for balance and advancing L LE  Ambulation/Gait             General Gait Details: unable  Stairs            Wheelchair Mobility    Modified Rankin (Stroke Patients Only) Modified Rankin (Stroke Patients Only) Pre-Morbid Rankin Score: Moderately severe disability Modified Rankin: Severe disability     Balance Overall balance assessment: Needs assistance Sitting-balance support: No upper extremity supported;Feet supported Sitting balance-Leahy Scale: Poor Sitting balance - Comments: requires up to maxA to maintain sitting balance at EOB.   Standing balance support: Bilateral upper extremity supported;During functional activity Standing  balance-Leahy Scale: Poor Standing balance comment: maxA+2 to maintain standing  Pertinent Vitals/Pain Pain Assessment: Faces Faces Pain Scale: Hurts little more Pain Location: "all over my body" Pain Descriptors / Indicators: Grimacing Pain Intervention(s): Monitored during session;Repositioned    Home Living Family/patient expects to be discharged to:: Skilled nursing facility                      Prior Function Level of Independence: Needs assistance   Gait / Transfers Assistance Needed: ambulates with Caniyah Murley at facility but with frequent falls per chart review  ADL's / Homemaking Assistance Needed: able to feed self per chart review        Hand Dominance        Extremity/Trunk Assessment   Upper Extremity Assessment Upper Extremity Assessment: Defer to OT evaluation    Lower Extremity Assessment Lower Extremity Assessment: Generalized weakness;LLE deficits/detail;Difficult to assess due to impaired cognition LLE Deficits / Details: difficult to fully assess due to impaired cognition. Difficult to discern between spontaneous or purposeful movement vs tremors. Patient with fully body tremors throughout but stating "I'm cold"    Cervical / Trunk Assessment Cervical / Trunk Assessment: Kyphotic  Communication   Communication: Other (comment) (mumbled, garbled speech. Difficult to understand)  Cognition Arousal/Alertness: Lethargic Behavior During Therapy: Flat affect Overall Cognitive Status: History of cognitive impairments - at baseline                                 General Comments: hx of dementia, follows commands with increased time      General Comments General comments (skin integrity, edema, etc.): patient on 2L O2 [place] on arrival with spO2 99-100%. Removed O2 for session with spO2 maintaining >95% throughout    Exercises     Assessment/Plan    PT Assessment Patient needs continued PT  services  PT Problem List Decreased strength;Decreased activity tolerance;Decreased mobility;Decreased balance;Decreased coordination;Decreased cognition;Decreased safety awareness       PT Treatment Interventions DME instruction;Gait training;Functional mobility training;Therapeutic activities;Therapeutic exercise;Balance training;Patient/family education;Wheelchair mobility training    PT Goals (Current goals can be found in the Care Plan section)  Acute Rehab PT Goals Patient Stated Goal: did not state PT Goal Formulation: Patient unable to participate in goal setting Time For Goal Achievement: 07/10/21 Potential to Achieve Goals: Fair    Frequency Min 3X/week   Barriers to discharge        Co-evaluation PT/OT/SLP Co-Evaluation/Treatment: Yes Reason for Co-Treatment: For patient/therapist safety;To address functional/ADL transfers PT goals addressed during session: Mobility/safety with mobility;Balance;Strengthening/ROM OT goals addressed during session: Strengthening/ROM;ADL's and self-care       AM-PAC PT "6 Clicks" Mobility  Outcome Measure Help needed turning from your back to your side while in a flat bed without using bedrails?: A Lot Help needed moving from lying on your back to sitting on the side of a flat bed without using bedrails?: A Lot Help needed moving to and from a bed to a chair (including a wheelchair)?: Total Help needed standing up from a chair using your arms (e.g., wheelchair or bedside chair)?: Total Help needed to walk in hospital room?: Total Help needed climbing 3-5 steps with a railing? : Total 6 Click Score: 8    End of Session Equipment Utilized During Treatment: Gait belt Activity Tolerance: Patient tolerated treatment well Patient left: in chair;with call bell/phone within reach;with chair alarm set Nurse Communication: Mobility status PT Visit Diagnosis: Unsteadiness on feet (R26.81);Muscle weakness (generalized) (M62.81);Repeated falls  (R29.6);Difficulty  in walking, not elsewhere classified (R26.2)    Time: [id number] PT Time Calculation (min) (ACUTE ONLY): 32 min   Charges:   PT Evaluation $PT Eval Moderate Complexity: 1 Mod PT Treatments $Therapeutic Activity: 8-22 mins        Anahy Esh A. Gilford Rile PT, DPT Acute Rehabilitation Services Pager [phone] Office [phone]   Linna Hoff 06/26/2021, 10:00 AM

## 2021-06-26 NOTE — Progress Notes (Signed)
Patient ID: Dominique [name], female   DOB: April 01, 1933, 87 y.o.   MRN: [id number]    Referring Physician(s): Code Stroke Bhagat,S  Supervising Physician: Corrie Mckusick  Patient Status:  [facility] - In-pt  Chief Complaint: Left sided weakness R MCA occlusion   Subjective: Patient extubated yesterday.  Maintaining airway and O2 levels. Weak, but following commands. Sitting up in chair with support.  No family at bedside.    Allergies: Codeine, Darvocet [propoxyphene n-acetaminophen], Demerol, Percocet [oxycodone-acetaminophen], and Prednisone  Medications: Prior to Admission medications   Medication Sig Start Date End Date Taking? Authorizing Provider  acetaminophen (TYLENOL) 500 MG tablet Take 500 mg by mouth every 6 (six) hours as needed for mild pain or headache.     [provider]  Amino Acids-Protein Hydrolys (FEEDING SUPPLEMENT, PRO-STAT SUGAR FREE 64,) LIQD Take 30 mLs by mouth 2 (two) times daily.    [provider]  cephALEXin (KEFLEX) 500 MG capsule Take 1 capsule (500 mg total) by mouth 4 (four) times daily. 10/01/20   Mesner, Corene Cornea, MD  clotrimazole (MYCELEX) 10 MG troche Take 10 mg by mouth 5 (five) times daily as needed (thrush).     [provider]  donepezil (ARICEPT) 5 MG tablet Take 5 mg by mouth daily. 01/25/19   [provider]  feeding supplement, ENSURE ENLIVE, (ENSURE ENLIVE) LIQD Take 237 mLs by mouth 2 (two) times daily between meals. Patient not taking: Reported on 09/25/2019 01/30/19   Eugenie Filler, MD  levothyroxine (SYNTHROID, LEVOTHROID) 50 MCG tablet Take 50 mcg by mouth daily before breakfast.    [provider]  metoprolol tartrate (LOPRESSOR) 50 MG tablet Take 50 mg by mouth daily.    [provider]  Multiple Vitamin (MULTIVITAMIN WITH MINERALS) TABS tablet Take 1 tablet by mouth daily. 01/31/19   Eugenie Filler, MD  simvastatin (ZOCOR) 20 MG tablet Take 20 mg by mouth daily.    [provider]     Vital Signs: BP (!) 124/96   Pulse 72   Temp 98.6 F (37 C) (Axillary)   Resp 12   Ht 4' 11.5" (1.511 m)   Wt 100 lb (45.4 kg)   SpO2 94%   BMI 19.86 kg/m   Physical Exam  NAD, alert Neuro:  intubated, following few commands, pupils equal.  No verbalization during assessment. No sig LUE movement; access site rt CFA soft, no sig hematoma, intact distal pulses; tremors RUE noted  Imaging: CT HEAD WO CONTRAST (5MM)  Result Date: 06/26/2021 CLINICAL DATA:  Stroke follow-up. Postop right MCA thrombectomy 06/24/2021 EXAM: CT HEAD WITHOUT CONTRAST TECHNIQUE: Contiguous axial images were obtained from the base of the skull through the vertex without intravenous contrast. COMPARISON:  CT head 06/24/2021.  MRI head 06/25/2021 FINDINGS: Brain: Acute infarct involving much of the right basal ganglia is best seen on diffusion-weighted imaging. There is mild local mass-effect. There is mild petechial hemorrhage in the right putamen, similar in appearance to the recent MRI but not present on the preprocedure CT. Generalized atrophy, most prominent in the temporal lobes bilaterally. Chronic microvascular ischemic changes throughout the white matter. Diffusion-weighted imaging demonstrated small areas of infarction in the right frontal and parietal cortex. CT hypodensity in the right inferior frontal lobe corresponds to this area. The right parietal infarct not well seen by CT. Vascular: Negative for hyperdense vessel Skull: Negative Sinuses/Orbits: Paranasal sinuses clear. Bilateral cataract extraction Other: Dominique [name] IMPRESSION: Acute right MCA infarct based on prior diffusion-weighted imaging Petechial hemorrhage  in the right putamen compatible with mild hemorrhage involving infarct basal ganglia. This is unchanged from the MRI yesterday. Additional areas of acute infarct in the right frontal and parietal lobe best seen on diffusion-weighted imaging. Electronically Signed   By: Franchot Gallo M.D.   On: 06/26/2021 07:15   MR ANGIO HEAD WO CONTRAST  Result Date: 06/25/2021 CLINICAL DATA:  Stroke, follow-up. EXAM: MRI HEAD WITHOUT CONTRAST MRA HEAD WITHOUT CONTRAST TECHNIQUE: Multiplanar, multi-echo pulse sequences of the brain and surrounding structures were acquired without intravenous contrast. Angiographic images of the Circle of Willis were acquired using MRA technique without intravenous contrast. COMPARISON:  Report from mechanical thrombectomy 06/24/2021. Noncontrast head CT 06/24/2021, CT angiogram head/neck and CT perfusion 06/24/2021. brain MRI 01/26/2019. FINDINGS: MRI HEAD FINDINGS Brain: Cerebral volume is normal. Moderate generalized cerebral atrophy. Comparatively mild cerebellar atrophy. Acute infarct within the right basal ganglia involving much of the right caudate and lentiform nuclei. Superimposed petechial hemorrhage within the right basal ganglia (Hiedelberg classifications, HI2). Mild local mass effect related to the infarction with subtle partial effacement of the right lateral ventricle. There are additional patchy small to moderate sized acute cortically-based infarcts within the right MCA vascular territory with involvement of the right frontal, parietal, posterior temporal and lateral occipital lobes. Notably, a small acute infarct involves the right motor strip. Additional punctate acute infarct within the right parietal lobe white matter. Punctate acute cortical infarct within the medial posterior left frontal lobe (precentral gyrus (series 5, image 95). Background mild multifocal T2/FLAIR hyperintensity within the cerebral white matter, nonspecific but compatible with chronic small vessel ischemic disease. Small chronic lacunar infarct within the right cerebellar hemisphere, new from the brain MRI of 01/26/2019 (series 10, image 5). No evidence of an intracranial mass. No extra-axial fluid collection. No midline shift. Vascular: Maintained flow voids within the  proximal large arterial vessels. Skull and upper cervical spine: No focal suspicious marrow lesion. Incompletely assessed cervical spondylosis. Sinuses/Orbits: Visualized orbits show no acute finding. Bilateral lens replacements. No significant paranasal sinus disease. MRA HEAD FINDINGS Anterior circulation: The intracranial internal carotid arteries are patent. The M1 right middle cerebral artery is now patent. No right M2 proximal branch occlusion is identified. Atherosclerotic irregularity of the right M2 and more distal right MCA vessels. Most notably, there is a moderate stenosis within a superior division proximal M2 right MCA vessel (series 1053, image 16). The M1 left middle cerebral artery is patent. No left M2 proximal branch occlusion or high-grade proximal stenosis. The anterior cerebral arteries are patent. Redemonstrated hypoplastic left A1 segment. No intracranial aneurysm is identified. Posterior circulation: Unchanged from the CTA head/neck of 06/24/2021, the left vertebral artery is occluded at the visualized distal cervical levels and at the proximal V4 level. There is enhancement within the mid and distal V4 left vertebral artery, likely due to retrograde flow. The V4 right vertebral artery is patent. The basilar artery is patent. The posterior cerebral arteries are patent. Posterior communicating arteries are present bilaterally. Anatomic variants: As described. IMPRESSION: MRI brain: 1. Acute right MCA territory infarcts within the right cerebral hemisphere and right basal ganglia, as described. Superimposed petechial hemorrhage within the right basal ganglia (Hiedelberg classifications, HI2). Mild mass effect related to the right basal ganglia infarct with subtle partial effacement of the right lateral ventricle. No midline shift. 2. Punctate acute cortical infarct within the medial posterior left frontal lobe. 3. Background mild chronic small vessel ischemic changes within the cerebral white  matter. 4. Small chronic lacunar infarct  within right cerebellar hemisphere, a new finding as compared to the brain MRI of 01/26/2019. 5. Moderate generalized cerebral atrophy. Comparatively mild cerebellar atrophy. MRA head: 1. The M1 right middle cerebral artery remains patent status post mechanical thrombectomy performed 06/24/2021. 2. Atherosclerotic irregularity of the M2 and more distal right MCA vessels. Most notably, a moderate stenosis is present within a superior division proximal M2 right MCA vessel. 3. Enhancement within the mid-to-distal V4 left vertebral artery, likely due to retrograde flow (given unchanged apparent occlusion of this vessel more proximally). Electronically Signed   By: Kellie Simmering D.O.   On: 06/25/2021 11:01   MR BRAIN WO CONTRAST  Result Date: 06/25/2021 CLINICAL DATA:  Stroke, follow-up. EXAM: MRI HEAD WITHOUT CONTRAST MRA HEAD WITHOUT CONTRAST TECHNIQUE: Multiplanar, multi-echo pulse sequences of the brain and surrounding structures were acquired without intravenous contrast. Angiographic images of the Circle of Willis were acquired using MRA technique without intravenous contrast. COMPARISON:  Report from mechanical thrombectomy 06/24/2021. Noncontrast head CT 06/24/2021, CT angiogram head/neck and CT perfusion 06/24/2021. brain MRI 01/26/2019. FINDINGS: MRI HEAD FINDINGS Brain: Cerebral volume is normal. Moderate generalized cerebral atrophy. Comparatively mild cerebellar atrophy. Acute infarct within the right basal ganglia involving much of the right caudate and lentiform nuclei. Superimposed petechial hemorrhage within the right basal ganglia (Hiedelberg classifications, HI2). Mild local mass effect related to the infarction with subtle partial effacement of the right lateral ventricle. There are additional patchy small to moderate sized acute cortically-based infarcts within the right MCA vascular territory with involvement of the right frontal, parietal, posterior  temporal and lateral occipital lobes. Notably, a small acute infarct involves the right motor strip. Additional punctate acute infarct within the right parietal lobe white matter. Punctate acute cortical infarct within the medial posterior left frontal lobe (precentral gyrus (series 5, image 95). Background mild multifocal T2/FLAIR hyperintensity within the cerebral white matter, nonspecific but compatible with chronic small vessel ischemic disease. Small chronic lacunar infarct within the right cerebellar hemisphere, new from the brain MRI of 01/26/2019 (series 10, image 5). No evidence of an intracranial mass. No extra-axial fluid collection. No midline shift. Vascular: Maintained flow voids within the proximal large arterial vessels. Skull and upper cervical spine: No focal suspicious marrow lesion. Incompletely assessed cervical spondylosis. Sinuses/Orbits: Visualized orbits show no acute finding. Bilateral lens replacements. No significant paranasal sinus disease. MRA HEAD FINDINGS Anterior circulation: The intracranial internal carotid arteries are patent. The M1 right middle cerebral artery is now patent. No right M2 proximal branch occlusion is identified. Atherosclerotic irregularity of the right M2 and more distal right MCA vessels. Most notably, there is a moderate stenosis within a superior division proximal M2 right MCA vessel (series 1053, image 16). The M1 left middle cerebral artery is patent. No left M2 proximal branch occlusion or high-grade proximal stenosis. The anterior cerebral arteries are patent. Redemonstrated hypoplastic left A1 segment. No intracranial aneurysm is identified. Posterior circulation: Unchanged from the CTA head/neck of 06/24/2021, the left vertebral artery is occluded at the visualized distal cervical levels and at the proximal V4 level. There is enhancement within the mid and distal V4 left vertebral artery, likely due to retrograde flow. The V4 right vertebral artery is  patent. The basilar artery is patent. The posterior cerebral arteries are patent. Posterior communicating arteries are present bilaterally. Anatomic variants: As described. IMPRESSION: MRI brain: 1. Acute right MCA territory infarcts within the right cerebral hemisphere and right basal ganglia, as described. Superimposed petechial hemorrhage within the right basal ganglia (Hiedelberg  classifications, HI2). Mild mass effect related to the right basal ganglia infarct with subtle partial effacement of the right lateral ventricle. No midline shift. 2. Punctate acute cortical infarct within the medial posterior left frontal lobe. 3. Background mild chronic small vessel ischemic changes within the cerebral white matter. 4. Small chronic lacunar infarct within right cerebellar hemisphere, a new finding as compared to the brain MRI of 01/26/2019. 5. Moderate generalized cerebral atrophy. Comparatively mild cerebellar atrophy. MRA head: 1. The M1 right middle cerebral artery remains patent status post mechanical thrombectomy performed 06/24/2021. 2. Atherosclerotic irregularity of the M2 and more distal right MCA vessels. Most notably, a moderate stenosis is present within a superior division proximal M2 right MCA vessel. 3. Enhancement within the mid-to-distal V4 left vertebral artery, likely due to retrograde flow (given unchanged apparent occlusion of this vessel more proximally). Electronically Signed   By: Kellie Simmering D.O.   On: 06/25/2021 11:01   IR CT Head Ltd  Result Date: 06/25/2021 INDICATION: 87 year old female presents with acute right MCA syndrome, for mechanical thrombectomy EXAM: ULTRASOUND-GUIDED ACCESS RIGHT COMMON FEMORAL ARTERY CERVICAL AND CEREBRAL ANGIOGRAM MECHANICAL THROMBECTOMY RIGHT MCA ANGIO-SEAL FOR HEMOSTASIS COMPARISON:  CT imaging of the same day MEDICATIONS: Dominique [name] ANESTHESIA/SEDATION: The anesthesia team was present to provide general endotracheal tube anesthesia and for patient monitoring  during the procedure. Intubation was performed in neuro IR biplane room. Left radial arterial line was performed by the anesthesia team. Interventional neuro radiology nursing staff was also present. CONTRAST:  75 cc FLUOROSCOPY TIME:  Fluoroscopy Time: 20 minutes 24 seconds (1154 mGy). COMPLICATIONS: Dominique [name] TECHNIQUE: Informed written consent was obtained from the patient's family after a thorough discussion of the procedural risks, benefits and alternatives. Specific risks discussed include: Bleeding, infection, contrast reaction, kidney injury/failure, need for further procedure/surgery, arterial injury or dissection, embolization to new territory, intracranial hemorrhage (10-15% risk), neurologic deterioration, cardiopulmonary collapse, death. All questions were addressed. Maximal Sterile Barrier Technique was utilized including during the procedure including caps, mask, sterile gowns, sterile gloves, sterile drape, hand hygiene and skin antiseptic. A timeout was performed prior to the initiation of the procedure. The anesthesia team was present to provide general endotracheal tube anesthesia and for patient monitoring during the procedure. Interventional neuro radiology nursing staff was also present. FINDINGS: Initial Findings: Right common carotid artery: Significant tortuosity of the common carotid artery at the base, and within the lower neck. No significant stenosis with mild atherosclerotic changes. Right external carotid artery: Patent with antegrade flow. Right internal carotid artery: Moderate tortuosity of the cervical ICA after the origin, with a single 90 degree angulated curvature just after the origin. Mixed calcified and soft plaque at the origin contributes to approximately 65% luminal narrowing by NASCET criteria. Right MCA: MCA is occluded at the origin, proximal M1 on the initial angiogram. There are significant leptomeningeal collaterals via the right-sided ACA territory, with back filling of  the cortical vessels. There is a relative paucity of perfusion in the basal ganglia despite the presence of the collaterals. Right ACA: A 1 segment patent. A 2 segment perfuses the right territory. Leptomeningeal collaterals via the anterior cerebral artery Completion Findings: Right MCA: After the first pass of mechanical thrombectomy there is restoration of flow within the proximal segment of the M1. This restored flow into the inferior division, with restoration of flow of the temporal lobe. There remained a loss of 50% of the territory in the superior division, frontal and parietal regions after the first pass. After the second pass  of mechanical thrombectomy, there was restoration of flow of both the inferior division and superior division, with complete reperfusion of the MCA territory. The perceived slow flow within the cortical vessels is secondary to occlusive balloon guide within the cervical ICA, and the robust collateral perfusion, with the competing collateral flow of unopacified blood. TICI 3: Complete perfusion of the territory Flat panel CT performed in the room demonstrates no evidence of hemorrhage, midline shift, or mass effect. There is contrast staining within the basal ganglia of the right, compatible with early basal ganglia infarct. PROCEDURE: The anesthesia team was present to provide general endotracheal tube anesthesia and for patient monitoring during the procedure. Intubation was performed in negative pressure [place] in neuro IR holding. Interventional neuro radiology nursing staff was also present. Ultrasound survey of the right inguinal region was performed with images stored and sent to PACs. 11 blade scalpel was used to make a small incision. Blunt dissection was performed with US guidance. A micropuncture needle was used access the right common femoral artery under ultrasound. With excellent arterial blood flow returned, an .018 micro wire was passed through the needle, observed to  enter the abdominal aorta under fluoroscopy. The needle was removed, and a micropuncture sheath was placed over the wire. The inner dilator and wire were removed, and an 035 wire was advanced under fluoroscopy into the abdominal aorta. The sheath was removed and a 25cm 64F straight vascular sheath was placed. The dilator was removed and the sheath was flushed. Sheath was attached to pressurized and heparinized saline bag for constant forward flow. JB 1 glide cath was advanced on the diagnostic catheter into the proximal descending thoracic aorta. Wire was removed and a double flush was performed. The catheter was then used to select the innominate artery, and a Glidewire navigated the catheter into the proximal cervical ICA. Wire was removed and angiogram was performed. Combination of the glide cath and the Glidewire were then used to navigate the catheter into the distal cervical ICA. Wire was removed and a double flush was performed. Rose in wire was then passed through the glide catheter. A coaxial system was then advanced over the [employer] wire. This included a 95cm 087 "Walrus" balloon guide with coaxial 125cm Berenstein diagnostic catheter. This was advanced to the carotid bifurcation. A telescoping technique was then necessary to navigate the parents teen catheter and the balloon guide through the 65% stenosis of the carotid bifurcation, alternating push on the parents teen catheter and the balloon guide. Ultimately the coaxial system was advanced into the distal cervical ICA. Wire and catheter were gently removed with adequate flush at the hub of the balloon guide. Formal angiogram was performed. Road map function was used once the occluded vessel was identified. Copious back flush was performed and the balloon catheter was attached to heparinized and pressurized saline bag for forward flow. A second coaxial system was then advanced through the balloon catheter, which included the selected intermediate  catheter, microcatheter, and microwire. In this scenario, the set up included a 115cm CAT-5 intermediate catheter, a 150cm Trevo [id number] microcatheter, and 014 synchro soft wire. This system was advanced through the balloon guide catheter under the road-map function, with adequate back-flush at the rotating hemostatic valve at that back end of the balloon guide. Microcatheter and the intermediate catheter system were advanced through the terminal ICA and MCA to the level of the occlusion. The micro wire was then carefully advanced through the occluded segment with a loop configuration. Microcatheter was  then manipulated through the occluded segment and the wire was removed with saline drip at the hub. Blood was then aspirated through the hub of the microcatheter, and a gentle contrast injection was performed confirming intraluminal position. A rotating hemostatic valve was then attached to the back end of the microcatheter, and a pressurized and heparinized saline bag was attached to the catheter. 4 x 40 solitaire device was then selected. Back flush was achieved at the rotating hemostatic valve, and then the device was gently advanced through the microcatheter to the distal end. The retriever was then unsheathed by withdrawing the microcatheter under fluoroscopy. Once the retriever was completely unsheathed, the microcatheter was carefully stripped from the delivery device. CT 5 catheter aspiration was initiated and then the catheter was gently advanced on the stem of the retriever until cessation of flow was observed. A 3 minute time interval was observed. The balloon at the balloon guide catheter was not inflated, given there was proximal flow arrest secondary to the occlusive nature across the plaque at the carotid bifurcation. Constant aspiration using the proprietary engine was performed at the intermediate catheter, as the retriever was gently and slowly withdrawn with fluoroscopic observation. Once the  retriever was "corked" within the tip of the intermediate catheter, both were removed from the system. Free aspiration was confirmed at the hub of the balloon guide catheter, with free blood return confirmed. Control angiogram was performed. Restoration of flow in the inferior division was confirmed. Superior division remained occluded. Roadmap angiogram was then performed. The tree Vo microcatheter and a synchro soft wire were then loaded coaxial within a zoom 55 aspiration catheter. This coaxial system was advanced through the balloon guide rotating hemostatic valve, advanced under roadmap angiogram to the division of the M1 into the superior and inferior division. Once the zoom catheter was at this division, the microcatheter microwire were removed. Aspiration was initiated on the zoom catheter, which was then advanced under fluoroscopic guidance into the occlusive M2 segment. Cessation of flow was observed. Once the catheter had been advanced approximately 1-2 cm into the M2 segment, the catheter was gently removed. Spontaneous flow returned once the catheter was withdrawn into the carotid siphon. Zoom catheter was completely removed from the system, the balloon guide was aspirated, and a control angiogram was performed. Complete restoration of flow was confirmed. Balloon guide was then removed. The skin at the puncture site was then cleaned with Chlorhexidine. The 8 French sheath was removed and an 34F angioseal was deployed. Flat panel CT was performed. At this point, we attempted extubation of the patient. During the cessation of anesthesia, hemodynamic instability was encountered. We first observed a hypertensive episode with systolic blood pressures in [id number] systolic range, tachycardia in the 120-150 range, and then subsequently hypotension, with a range of 70s-105 by the arterial pressure recording. Once the patient had been relatively stabilized, she was transported, intubated, to the [facility] ICU. No  complications were encountered. Estimated blood loss: 100 cc IMPRESSION: Status post ultrasound guided access right common femoral artery for right-sided cervical/cerebral angiogram and mechanical thrombectomy of proximal right M1 occlusion, achieving TICI 3 perfusion with 2 passes combination solitaire/aspiration, and direct aspiration. Angio-Seal deployed for hemostasis Signed, Dulcy Fanny. Dellia Nims, RPVI [facility] PLAN: The patient will remain intubated, given her hemodynamic instability upon attempted extubation ICU status Target systolic blood pressure of 120-140 Right hip straight time 6 hours Frequent neurovascular checks Repeat neurologic imaging with CT and/MRI at the discretion  of neurology team Electronically Signed   By: Corrie Mckusick D.O.   On: 06/25/2021 08:35   IR US Guide Vasc Access Right  Result Date: 06/25/2021 INDICATION: 87 year old female presents with acute right MCA syndrome, for mechanical thrombectomy EXAM: ULTRASOUND-GUIDED ACCESS RIGHT COMMON FEMORAL ARTERY CERVICAL AND CEREBRAL ANGIOGRAM MECHANICAL THROMBECTOMY RIGHT MCA ANGIO-SEAL FOR HEMOSTASIS COMPARISON:  CT imaging of the same day MEDICATIONS: Dominique [name] ANESTHESIA/SEDATION: The anesthesia team was present to provide general endotracheal tube anesthesia and for patient monitoring during the procedure. Intubation was performed in neuro IR biplane room. Left radial arterial line was performed by the anesthesia team. Interventional neuro radiology nursing staff was also present. CONTRAST:  75 cc FLUOROSCOPY TIME:  Fluoroscopy Time: 20 minutes 24 seconds (1154 mGy). COMPLICATIONS: Dominique [name] TECHNIQUE: Informed written consent was obtained from the patient's family after a thorough discussion of the procedural risks, benefits and alternatives. Specific risks discussed include: Bleeding, infection, contrast reaction, kidney injury/failure, need for further procedure/surgery, arterial  injury or dissection, embolization to new territory, intracranial hemorrhage (10-15% risk), neurologic deterioration, cardiopulmonary collapse, death. All questions were addressed. Maximal Sterile Barrier Technique was utilized including during the procedure including caps, mask, sterile gowns, sterile gloves, sterile drape, hand hygiene and skin antiseptic. A timeout was performed prior to the initiation of the procedure. The anesthesia team was present to provide general endotracheal tube anesthesia and for patient monitoring during the procedure. Interventional neuro radiology nursing staff was also present. FINDINGS: Initial Findings: Right common carotid artery: Significant tortuosity of the common carotid artery at the base, and within the lower neck. No significant stenosis with mild atherosclerotic changes. Right external carotid artery: Patent with antegrade flow. Right internal carotid artery: Moderate tortuosity of the cervical ICA after the origin, with a single 90 degree angulated curvature just after the origin. Mixed calcified and soft plaque at the origin contributes to approximately 65% luminal narrowing by NASCET criteria. Right MCA: MCA is occluded at the origin, proximal M1 on the initial angiogram. 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 There is a relative paucity of perfusion in the basal ganglia despite the presence of the collaterals. Right ACA: A 1 segment patent. A 2 segment perfuses the right territory. Leptomeningeal collaterals via the anterior cerebral artery Completion Findings: Right MCA: After the first pass of mechanical thrombectomy there is restoration of flow within the proximal segment of the M1. This restored flow into the inferior division, with restoration of flow of the temporal lobe. There remained a loss of 50% of the territory in the superior division, frontal and parietal regions  after the first pass. After the second pass of mechanical thrombectomy, there was restoration of flow of both the inferior division and superior division, with complete reperfusion of the MCA territory. The perceived slow flow within the cortical vessels is secondary to occlusive balloon guide within the cervical ICA, and the robust collateral perfusion, with the competing collateral flow of unopacified blood. TICI 3: Complete perfusion of the territory Flat panel CT performed in the room demonstrates no evidence of hemorrhage, midline shift, or mass effect. There is contrast staining within the basal ganglia of the right, compatible with early basal ganglia infarct. PROCEDURE: The anesthesia team was present to provide general endotracheal tube anesthesia and for patient monitoring during the procedure. Intubation was performed in negative pressure [place] in neuro IR holding. Interventional neuro radiology nursing staff was also present. Ultrasound survey of the right inguinal region was  performed with images stored and sent to PACs. 11 blade scalpel was used to make a small incision. Blunt dissection was performed with US guidance. A micropuncture needle was used access the right common femoral artery under ultrasound. With excellent arterial blood flow returned, an .018 micro wire was passed through the needle, observed to enter the abdominal aorta under fluoroscopy. The needle was removed, and a micropuncture sheath was placed over the wire. The inner dilator and wire were removed, and an 035 wire was advanced under fluoroscopy into the abdominal aorta. The sheath was removed and a 25cm 27F straight vascular sheath was placed. The dilator was removed and the sheath was flushed. Sheath was attached to pressurized and heparinized saline bag for constant forward flow. JB 1 glide cath was advanced on the diagnostic catheter into the proximal descending thoracic aorta. Wire was removed and a double flush was performed.  The catheter was then used to select the innominate artery, and a Glidewire navigated the catheter into the proximal cervical ICA. Wire was removed and angiogram was performed. Combination of the glide cath and the Glidewire were then used to navigate the catheter into the distal cervical ICA. Wire was removed and a double flush was performed. Rose in wire was then passed through the glide catheter. A coaxial system was then advanced over the [employer] wire. This included a 95cm 087 "Walrus" balloon guide with coaxial 125cm Berenstein diagnostic catheter. This was advanced to the carotid bifurcation. A telescoping technique was then necessary to navigate the parents teen catheter and the balloon guide through the 65% stenosis of the carotid bifurcation, alternating push on the parents teen catheter and the balloon guide. Ultimately the coaxial system was advanced into the distal cervical ICA. Wire and catheter were gently removed with adequate flush at the hub of the balloon guide. Formal angiogram was performed. Road map function was used once the occluded vessel was identified. Copious back flush was performed and the balloon catheter was attached to heparinized and pressurized saline bag for forward flow. A second coaxial system was then advanced through the balloon catheter, which included the selected intermediate catheter, microcatheter, and microwire. In this scenario, the set up included a 115cm CAT-5 intermediate catheter, a 150cm Trevo [id number] microcatheter, and 014 synchro soft wire. This system was advanced through the balloon guide catheter under the road-map function, with adequate back-flush at the rotating hemostatic valve at that back end of the balloon guide. Microcatheter and the intermediate catheter system were advanced through the terminal ICA and MCA to the level of the occlusion. The micro wire was then carefully advanced through the occluded segment with a loop configuration. Microcatheter  was then manipulated through the occluded segment and the wire was removed with saline drip at the hub. Blood was then aspirated through the hub of the microcatheter, and a gentle contrast injection was performed confirming intraluminal position. A rotating hemostatic valve was then attached to the back end of the microcatheter, and a pressurized and heparinized saline bag was attached to the catheter. 4 x 40 solitaire device was then selected. Back flush was achieved at the rotating hemostatic valve, and then the device was gently advanced through the microcatheter to the distal end. The retriever was then unsheathed by withdrawing the microcatheter under fluoroscopy. Once the retriever was completely unsheathed, the microcatheter was carefully stripped from the delivery device. CT 5 catheter aspiration was initiated and then the catheter was gently advanced on the stem of the retriever until cessation  of flow was observed. A 3 minute time interval was observed. The balloon at the balloon guide catheter was not inflated, given there was proximal flow arrest secondary to the occlusive nature across the plaque at the carotid bifurcation. Constant aspiration using the proprietary engine was performed at the intermediate catheter, as the retriever was gently and slowly withdrawn with fluoroscopic observation. Once the retriever was "corked" within the tip of the intermediate catheter, both were removed from the system. Free aspiration was confirmed at the hub of the balloon guide catheter, with free blood return confirmed. Control angiogram was performed. Restoration of flow in the inferior division was confirmed. Superior division remained occluded. Roadmap angiogram was then performed. The tree Vo microcatheter and a synchro soft wire were then loaded coaxial within a zoom 55 aspiration catheter. This coaxial system was advanced through the balloon guide rotating hemostatic valve, advanced under roadmap angiogram to  the division of the M1 into the superior and inferior division. Once the zoom catheter was at this division, the microcatheter microwire were removed. Aspiration was initiated on the zoom catheter, which was then advanced under fluoroscopic guidance into the occlusive M2 segment. Cessation of flow was observed. Once the catheter had been advanced approximately 1-2 cm into the M2 segment, the catheter was gently removed. Spontaneous flow returned once the catheter was withdrawn into the carotid siphon. Zoom catheter was completely removed from the system, the balloon guide was aspirated, and a control angiogram was performed. Complete restoration of flow was confirmed. Balloon guide was then removed. The skin at the puncture site was then cleaned with Chlorhexidine. The 8 French sheath was removed and an 45F angioseal was deployed. Flat panel CT was performed. At this point, we attempted extubation of the patient. During the cessation of anesthesia, hemodynamic instability was encountered. We first observed a hypertensive episode with systolic blood pressures in [id number] systolic range, tachycardia in the 120-150 range, and then subsequently hypotension, with a range of 70s-105 by the arterial pressure recording. Once the patient had been relatively stabilized, she was transported, intubated, to the [facility]. No complications were encountered. Estimated blood loss: 100 cc IMPRESSION: Status post ultrasound guided access right common femoral artery for right-sided cervical/cerebral angiogram and mechanical thrombectomy of proximal right M1 occlusion, achieving TICI 3 perfusion with 2 passes combination solitaire/aspiration, and direct aspiration. Angio-Seal deployed for hemostasis Signed, Dulcy Fanny. Dellia Nims, RPVI [facility] Radiology PLAN: The patient will remain intubated, given her hemodynamic instability upon attempted extubation ICU status Target systolic blood  pressure of 120-140 Right hip straight time 6 hours Frequent neurovascular checks Repeat neurologic imaging with CT and/MRI at the discretion of neurology team Electronically Signed   By: Corrie Mckusick D.O.   On: 06/25/2021 08:35   CT C-SPINE NO CHARGE  Result Date: 06/24/2021 CLINICAL DATA:  Neck trauma. EXAM: CT CERVICAL SPINE WITHOUT CONTRAST TECHNIQUE: Multidetector CT imaging of the cervical spine was performed without intravenous contrast. Multiplanar CT image reconstructions were also generated. COMPARISON:  Cervical spine CT 01/25/2019. CTA head and neck 06/24/2021. FINDINGS: Alignment: There is levoconvex curvature of the cervical spine. There is 3 mm of anterolisthesis at C7-T1 which is favored as degenerative. Alignment is otherwise anatomic. This is unchanged from the prior examination. Skull base and vertebrae: No acute fracture. No primary bone lesion or focal pathologic process. The bones are diffusely osteopenic. Soft tissues and spinal canal: No prevertebral fluid or swelling. No visible canal hematoma. Please see CTA angiogram  head and neck performed same day for further description vascularity/arterial structures. Disc levels: There is disc space narrowing, endplate osteophyte formation and sclerosis from C3 through C7 compatible with degenerative change. Multilevel neural foraminal stenosis, left greater than right, appears unchanged secondary to uncovertebral spurring. There is no severe central canal stenosis at any level. Upper chest: There is scarring in both lung apices. Other: Dominique [name]. IMPRESSION: No acute fracture or traumatic malalignment of the cervical spine. Electronically Signed   By: Ronney Asters M.D.   On: 06/24/2021 20:47   DG CHEST PORT 1 VIEW  Result Date: 06/24/2021 CLINICAL DATA:  Respiratory distress EXAM: PORTABLE CHEST 1 VIEW COMPARISON:  10/01/2020 FINDINGS: Endotracheal tube tip is at the level of the clavicular heads. There is right hilar prominence, likely an  enlarged pulmonary artery. No focal airspace consolidation or pulmonary edema. No sizable pleural effusion. IMPRESSION: 1. Endotracheal tube tip at the level of the clavicular heads. 2. Right hilar prominence, likely enlarged pulmonary artery. Electronically Signed   By: Ulyses Jarred M.D.   On: 06/24/2021 23:10   ECHOCARDIOGRAM COMPLETE  Result Date: 06/25/2021    ECHOCARDIOGRAM REPORT   Patient Name:   DENIQUA SHROFF [place] Date of Exam: 06/25/2021 Medical Rec #:  [id number]     Height:       59.5 in Accession #:    [id number]    Weight:       100.0 lb Date of Birth:  1933/06/25    BSA:          1.382 m Patient Age:    35 years      BP:           179/80 mmHg Patient Gender: F             HR:           54 bpm. Exam Location:  Inpatient Procedure: 3D Echo, 2D Echo, Cardiac Doppler and Color Doppler Indications:    Stroke  History:        Patient has prior history of Echocardiogram examinations, most                 recent 09/30/2017. Abnormal ECG, Stroke, Arrythmias:Atrial                 Fibrillation; Signs/Symptoms:Dyspnea, Shortness of Breath,                 Altered Mental Status, Alzheimer's and                 Dizziness/Lightheadedness.  Sonographer:    Roseanna Rainbow RDCS Referring Phys: [id number] [place]  Sonographer Comments: Echo performed with patient supine and on artificial respirator. IMPRESSIONS  1. Moderate hyeprtrophy of the basal septum with otherwise mild concentric LVH. Left ventricular ejection fraction, by estimation, is 60 to 65%. The left ventricle has normal function. The left ventricle has no regional wall motion abnormalities. There is moderate left ventricular hypertrophy. Left ventricular diastolic parameters are indeterminate. Elevated left ventricular end-diastolic pressure.  2. Right ventricular systolic function is normal. The right ventricular size is normal. There is mildly elevated pulmonary artery systolic pressure.  3. The mitral valve is normal in structure. Trivial mitral valve  regurgitation. No evidence of mitral stenosis. Moderate mitral annular calcification.  4. Tricuspid valve regurgitation is mild to moderate.  5. The aortic valve is calcified. There is severe calcifcation of the aortic valve. There is severe thickening of the aortic valve. Aortic valve regurgitation is mild. Mild  to moderate aortic valve sclerosis/calcification is present, without any evidence of aortic stenosis.  6. The inferior vena cava is normal in size with <50% respiratory variability, suggesting right atrial pressure of 8 mmHg. Comparison(s): Echo was compared side by side with the echo from 09/2017. There is now severe thickening and calcification of the aortic valve. There is also an echodense (1.0 x 1.0 cm) partially mobile density in the LVOT between the anterior mitral valve leaflet and the aortic valve that was not present in 2018. Recommend TEE to better evaluate. FINDINGS  Left Ventricle: Moderate hyeprtrophy of the basal septum with otherwise mild concentric LVH. Left ventricular ejection fraction, by estimation, is 60 to 65%. The left ventricle has normal function. The left ventricle has no regional wall motion abnormalities. The left ventricular internal cavity size was normal in size. There is moderate left ventricular hypertrophy. Left ventricular diastolic parameters are indeterminate. Elevated left ventricular end-diastolic pressure. Right Ventricle: The right ventricular size is normal. No increase in right ventricular wall thickness. Right ventricular systolic function is normal. There is mildly elevated pulmonary artery systolic pressure. The tricuspid regurgitant velocity is 2.84  m/s, and with an assumed right atrial pressure of 8 mmHg, the estimated right ventricular systolic pressure is 0000000 mmHg. Left Atrium: Left atrial size was normal in size. Right Atrium: Right atrial size was normal in size. Pericardium: There is no evidence of pericardial effusion. Mitral Valve: The mitral valve  is normal in structure. Moderate mitral annular calcification. Trivial mitral valve regurgitation. No evidence of mitral valve stenosis. MV peak gradient, 6.0 mmHg. The mean mitral valve gradient is 1.0 mmHg. Tricuspid Valve: The tricuspid valve is normal in structure. Tricuspid valve regurgitation is mild to moderate. No evidence of tricuspid stenosis. Aortic Valve: The aortic valve is calcified. There is severe calcifcation of the aortic valve. There is severe thickening of the aortic valve. Aortic valve regurgitation is mild. Mild to moderate aortic valve sclerosis/calcification is present, without any evidence of aortic stenosis. Aortic valve mean gradient measures 7.7 mmHg. Aortic valve peak gradient measures 13.5 mmHg. Aortic valve area, by VTI measures 1.33 cm. Pulmonic Valve: The pulmonic valve was normal in structure. Pulmonic valve regurgitation is not visualized. No evidence of pulmonic stenosis. Aorta: The aortic root is normal in size and structure. Venous: The inferior vena cava is normal in size with less than 50% respiratory variability, suggesting right atrial pressure of 8 mmHg. IAS/Shunts: No atrial level shunt detected by color flow Doppler.  LEFT VENTRICLE PLAX 2D LVIDd:         3.10 cm     Diastology LVIDs:         2.00 cm     LV e' medial:    4.61 cm/s LV PW:         1.10 cm     LV E/e' medial:  23.9 LV IVS:        1.40 cm     LV e' lateral:   5.68 cm/s LVOT diam:     2.00 cm     LV E/e' lateral: 19.4 LV SV:         56 LV SV Index:   40 LVOT Area:     3.14 cm                             3D Volume EF: LV Volumes (MOD)           3D EF:  55 % LV vol d, MOD A2C: 46.2 ml LV EDV:       66 ml LV vol d, MOD A4C: 36.8 ml LV ESV:       30 ml LV vol s, MOD A2C: 13.1 ml LV SV:        37 ml LV vol s, MOD A4C: 15.6 ml LV SV MOD A2C:     33.1 ml LV SV MOD A4C:     36.8 ml LV SV MOD BP:      28.9 ml RIGHT VENTRICLE            IVC RV S prime:     9.23 cm/s  IVC diam: 1.60 cm TAPSE (M-mode): 1.2 cm LEFT  ATRIUM           Index       RIGHT ATRIUM           Index LA diam:      3.70 cm 2.68 cm/m  RA Area:     14.00 cm LA Vol (A2C): 35.8 ml 25.91 ml/m RA Volume:   34.20 ml  24.75 ml/m LA Vol (A4C): 76.0 ml 55.00 ml/m  AORTIC VALVE                    PULMONIC VALVE AV Area (Vmax):    1.24 cm     PR End Diast Vel: 1.99 msec AV Area (Vmean):   1.31 cm AV Area (VTI):     1.33 cm AV Vmax:           183.67 cm/s AV Vmean:          128.000 cm/s AV VTI:            0.421 m AV Peak Grad:      13.5 mmHg AV Mean Grad:      7.7 mmHg LVOT Vmax:         72.50 cm/s LVOT Vmean:        53.400 cm/s LVOT VTI:          0.178 m LVOT/AV VTI ratio: 0.42  AORTA Ao Root diam: 3.40 cm Ao Asc diam:  3.10 cm MITRAL VALVE                TRICUSPID VALVE MV Area (PHT): 4.39 cm     TR Peak grad:   32.3 mmHg MV Area VTI:   1.54 cm     TR Vmax:        284.00 cm/s MV Peak grad:  6.0 mmHg MV Mean grad:  1.0 mmHg     SHUNTS MV Vmax:       1.22 m/s     Systemic VTI:  0.18 m MV Vmean:      50.9 cm/s    Systemic Diam: 2.00 cm MV Decel Time: 173 msec MV E velocity: 110.00 cm/s MV A velocity: 38.10 cm/s MV E/A ratio:  2.89 Skeet Latch MD Electronically signed by Skeet Latch MD Signature Date/Time: 06/25/2021/3:42:19 PM    Final    IR PERCUTANEOUS ART THROMBECTOMY/INFUSION INTRACRANIAL INC DIAG ANGIO  Result Date: 06/25/2021 INDICATION: 87 year old female presents with acute right MCA syndrome, for mechanical thrombectomy EXAM: ULTRASOUND-GUIDED ACCESS RIGHT COMMON FEMORAL ARTERY CERVICAL AND CEREBRAL ANGIOGRAM MECHANICAL THROMBECTOMY RIGHT MCA ANGIO-SEAL FOR HEMOSTASIS COMPARISON:  CT imaging of the same day MEDICATIONS: Dominique [name] ANESTHESIA/SEDATION: The anesthesia team was present to provide general endotracheal tube anesthesia and for patient monitoring during the procedure. Intubation was performed in neuro IR biplane  room. Left radial arterial line was performed by the anesthesia team. Interventional neuro radiology nursing staff was also  present. CONTRAST:  75 cc FLUOROSCOPY TIME:  Fluoroscopy Time: 20 minutes 24 seconds (1154 mGy). COMPLICATIONS: Dominique [name] TECHNIQUE: Informed written consent was obtained from the patient's family after a thorough discussion of the procedural risks, benefits and alternatives. Specific risks discussed include: Bleeding, infection, contrast reaction, kidney injury/failure, need for further procedure/surgery, arterial injury or dissection, embolization to new territory, intracranial hemorrhage (10-15% risk), neurologic deterioration, cardiopulmonary collapse, death. All questions were addressed. Maximal Sterile Barrier Technique was utilized including during the procedure including caps, mask, sterile gowns, sterile gloves, sterile drape, hand hygiene and skin antiseptic. A timeout was performed prior to the initiation of the procedure. The anesthesia team was present to provide general endotracheal tube anesthesia and for patient monitoring during the procedure. Interventional neuro radiology nursing staff was also present. FINDINGS: Initial Findings: Right common carotid artery: Significant tortuosity of the common carotid artery at the base, and within the lower neck. No significant stenosis with mild atherosclerotic changes. Right external carotid artery: Patent with antegrade flow. Right internal carotid artery: Moderate tortuosity of the cervical ICA after the origin, with a single 90 degree angulated curvature just after the origin. Mixed calcified and soft plaque at the origin contributes to approximately 65% luminal narrowing by NASCET criteria. Right MCA: MCA is occluded at the origin, proximal M1 on the initial angiogram. 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 There is a relative paucity of perfusion in the basal ganglia despite the presence of the collaterals. Right ACA: A 1 segment patent. A 2 segment perfuses the right  territory. Leptomeningeal collaterals via the anterior cerebral artery Completion Findings: Right MCA: After the first pass of mechanical thrombectomy there is restoration of flow within the proximal segment of the M1. This restored flow into the inferior division, with restoration of flow of the temporal lobe. There remained a loss of 50% of the territory in the superior division, frontal and parietal regions after the first pass. After the second pass of mechanical thrombectomy, there was restoration of flow of both the inferior division and superior division, with complete reperfusion of the MCA territory. The perceived slow flow within the cortical vessels is secondary to occlusive balloon guide within the cervical ICA, and the robust collateral perfusion, with the competing collateral flow of unopacified blood. TICI 3: Complete perfusion of the territory Flat panel CT performed in the room demonstrates no evidence of hemorrhage, midline shift, or mass effect. There is contrast staining within the basal ganglia of the right, compatible with early basal ganglia infarct. PROCEDURE: The anesthesia team was present to provide general endotracheal tube anesthesia and for patient monitoring during the procedure. Intubation was performed in negative pressure [place] in neuro IR holding. Interventional neuro radiology nursing staff was also present. Ultrasound survey of the right inguinal region was performed with images stored and sent to PACs. 11 blade scalpel was used to make a small incision. Blunt dissection was performed with US guidance. A micropuncture needle was used access the right common femoral artery under ultrasound. With excellent arterial blood flow returned, an .018 micro wire was passed through the needle, observed to enter the abdominal aorta under fluoroscopy. The needle was removed, and a micropuncture sheath was placed over the wire. The inner dilator and wire were removed, and an 035 wire was  advanced under fluoroscopy into the abdominal aorta. The  sheath was removed and a 25cm 71F straight vascular sheath was placed. The dilator was removed and the sheath was flushed. Sheath was attached to pressurized and heparinized saline bag for constant forward flow. JB 1 glide cath was advanced on the diagnostic catheter into the proximal descending thoracic aorta. Wire was removed and a double flush was performed. The catheter was then used to select the innominate artery, and a Glidewire navigated the catheter into the proximal cervical ICA. Wire was removed and angiogram was performed. Combination of the glide cath and the Glidewire were then used to navigate the catheter into the distal cervical ICA. Wire was removed and a double flush was performed. Rose in wire was then passed through the glide catheter. A coaxial system was then advanced over the [employer] wire. This included a 95cm 087 "Walrus" balloon guide with coaxial 125cm Berenstein diagnostic catheter. This was advanced to the carotid bifurcation. A telescoping technique was then necessary to navigate the parents teen catheter and the balloon guide through the 65% stenosis of the carotid bifurcation, alternating push on the parents teen catheter and the balloon guide. Ultimately the coaxial system was advanced into the distal cervical ICA. Wire and catheter were gently removed with adequate flush at the hub of the balloon guide. Formal angiogram was performed. Road map function was used once the occluded vessel was identified. Copious back flush was performed and the balloon catheter was attached to heparinized and pressurized saline bag for forward flow. A second coaxial system was then advanced through the balloon catheter, which included the selected intermediate catheter, microcatheter, and microwire. In this scenario, the set up included a 115cm CAT-5 intermediate catheter, a 150cm Trevo [id number] microcatheter, and 014 synchro soft wire. This  system was advanced through the balloon guide catheter under the road-map function, with adequate back-flush at the rotating hemostatic valve at that back end of the balloon guide. Microcatheter and the intermediate catheter system were advanced through the terminal ICA and MCA to the level of the occlusion. The micro wire was then carefully advanced through the occluded segment with a loop configuration. Microcatheter was then manipulated through the occluded segment and the wire was removed with saline drip at the hub. Blood was then aspirated through the hub of the microcatheter, and a gentle contrast injection was performed confirming intraluminal position. A rotating hemostatic valve was then attached to the back end of the microcatheter, and a pressurized and heparinized saline bag was attached to the catheter. 4 x 40 solitaire device was then selected. Back flush was achieved at the rotating hemostatic valve, and then the device was gently advanced through the microcatheter to the distal end. The retriever was then unsheathed by withdrawing the microcatheter under fluoroscopy. Once the retriever was completely unsheathed, the microcatheter was carefully stripped from the delivery device. CT 5 catheter aspiration was initiated and then the catheter was gently advanced on the stem of the retriever until cessation of flow was observed. A 3 minute time interval was observed. The balloon at the balloon guide catheter was not inflated, given there was proximal flow arrest secondary to the occlusive nature across the plaque at the carotid bifurcation. Constant aspiration using the proprietary engine was performed at the intermediate catheter, as the retriever was gently and slowly withdrawn with fluoroscopic observation. Once the retriever was "corked" within the tip of the intermediate catheter, both were removed from the system. Free aspiration was confirmed at the hub of the balloon guide catheter, with free  blood return confirmed. Control angiogram was performed. Restoration of flow in the inferior division was confirmed. Superior division remained occluded. Roadmap angiogram was then performed. The tree Vo microcatheter and a synchro soft wire were then loaded coaxial within a zoom 55 aspiration catheter. This coaxial system was advanced through the balloon guide rotating hemostatic valve, advanced under roadmap angiogram to the division of the M1 into the superior and inferior division. Once the zoom catheter was at this division, the microcatheter microwire were removed. Aspiration was initiated on the zoom catheter, which was then advanced under fluoroscopic guidance into the occlusive M2 segment. Cessation of flow was observed. Once the catheter had been advanced approximately 1-2 cm into the M2 segment, the catheter was gently removed. Spontaneous flow returned once the catheter was withdrawn into the carotid siphon. Zoom catheter was completely removed from the system, the balloon guide was aspirated, and a control angiogram was performed. Complete restoration of flow was confirmed. Balloon guide was then removed. The skin at the puncture site was then cleaned with Chlorhexidine. The 8 French sheath was removed and an 28F angioseal was deployed. Flat panel CT was performed. At this point, we attempted extubation of the patient. During the cessation of anesthesia, hemodynamic instability was encountered. We first observed a hypertensive episode with systolic blood pressures in [id number] systolic range, tachycardia in the 120-150 range, and then subsequently hypotension, with a range of 70s-105 by the arterial pressure recording. Once the patient had been relatively stabilized, she was transported, intubated, to the [facility] ICU. No complications were encountered. Estimated blood loss: 100 cc IMPRESSION: Status post ultrasound guided access right common femoral artery for right-sided cervical/cerebral angiogram and  mechanical thrombectomy of proximal right M1 occlusion, achieving TICI 3 perfusion with 2 passes combination solitaire/aspiration, and direct aspiration. Angio-Seal deployed for hemostasis Signed, Dulcy Fanny. Dellia Nims, RPVI [facility] Radiology PLAN: The patient will remain intubated, given her hemodynamic instability upon attempted extubation ICU status Target systolic blood pressure of 120-140 Right hip straight time 6 hours Frequent neurovascular checks Repeat neurologic imaging with CT and/MRI at the discretion of neurology team Electronically Signed   By: Corrie Mckusick D.O.   On: 06/25/2021 08:35   CT HEAD CODE STROKE WO CONTRAST  Result Date: 06/24/2021 CLINICAL DATA:  Code stroke.  Neuro deficit, acute, stroke suspected EXAM: CT HEAD WITHOUT CONTRAST TECHNIQUE: Contiguous axial images were obtained from the base of the skull through the vertex without intravenous contrast. COMPARISON:  CT head 10/01/2020. FINDINGS: Brain: No evidence of acute large vascular territory infarction, hemorrhage, hydrocephalus, extra-axial collection or mass lesion/mass effect. Mild patchy white matter hypodensities, nonspecific but compatible with chronic microvascular ischemic disease. Chronic basal ganglia mineralization. Vascular: No hyperdense vessel identified. Skull: No acute fracture. Sinuses/Orbits: Clear sinuses.  No acute orbital finding. Other: No mastoid effusions. ASPECTS [facility] Stroke Program Early CT Score) Total score (0-10 with 10 being normal): 10. IMPRESSION: 1. No evidence of acute large vascular territory infarct or acute hemorrhage. 2. ASPECTS is 10. Code stroke imaging results were communicated on 06/24/2021 at 7:27 pm to provider Dr. Curly Shores Via secure text paging. Electronically Signed   By: Margaretha Sheffield M.D.   On: 06/24/2021 19:28   CT ANGIO HEAD NECK W WO CM W PERF (CODE STROKE)  Result Date: 06/24/2021 CLINICAL DATA:  Neuro deficit, acute,  stroke suspected EXAM: CT ANGIOGRAPHY HEAD AND NECK CT PERFUSION BRAIN TECHNIQUE: Multidetector CT imaging of the head and neck was performed using  the standard protocol during bolus administration of intravenous contrast. Multiplanar CT image reconstructions and MIPs were obtained to evaluate the vascular anatomy. Carotid stenosis measurements (when applicable) are obtained utilizing NASCET criteria, using the distal internal carotid diameter as the denominator. Multiphase CT imaging of the brain was performed following IV bolus contrast injection. Subsequent parametric perfusion maps were calculated using RAPID software. CONTRAST:  136m OMNIPAQUE IOHEXOL 350 MG/ML SOLN COMPARISON:  Dominique [name]. FINDINGS: CTA NECK FINDINGS Aortic arch: Vessel origins are patent. Right carotid system: Mixed calcific and noncalcific atherosclerosis of the carotid bifurcation with approximately 60-70% stenosis of the proximal ICA. Left carotid system: Calcific and noncalcific atherosclerosis at the carotid bifurcation with resulting severe stenosis of the proximal ICA, difficult to quantify but likely greater than 80%. Vertebral arteries: Occlusion of the left vertebral artery at its origin with non opacification of the neck. Right vertebral artery is patent without significant (greater than 50%) stenosis. Skeleton: Moderate to severe multilevel degenerative change in the cervical spine. Other neck: No acute abnormality. Upper chest: Small to moderate bilateral layering pleural effusions. Partially imaged fibrotic change, better characterized on prior CT chest from 2019. Debris within the trachea, placing the patient at risk for aspiration. Review of the MIP images confirms the above findings CTA HEAD FINDINGS Anterior circulation: Bilateral intracranial ICAs are patent without significant stenosis. Abrupt occlusion of the proximal right M1 MCA. There are some opacified distal right MCA branches, asymmetrically diminished compared to the  left. Right A1 ACA is patent. Left M1 MCA and proximal left anterior MCA branches are patent. Small left A1 ACA, poorly opacified proximally. Bilateral A2 ACAS are patent without significant stenosis. No aneurysm identified. Posterior circulation: Reconstitution of the left intradural vertebral artery. Right vertebral artery and basilar artery are patent without hemodynamically significant stenosis. Somewhat dysplastic appearance of the basilar artery without discrete aneurysm. Bilateral posterior communicating arteries. Bilateral posterior cerebral arteries are patent without proximal hemodynamically significant stenosis. Venous sinuses: Nondiagnostic evaluation due to arterial timing. Anatomic variants: See above. Review of the MIP images confirms the above findings CT Brain Perfusion Findings: ASPECTS: 10 CBF (<30%) Volume: 067mPerfusion (Tmax>6.0s) volume: 7959mismatch Volume: 38m78mfarction Location:Dominique [name] IMPRESSION: 1. Occlusion of the proximal right M1 MCA. 2. Large area of right MCA territory penumbra (79 mL) without evidence of core infarct. 3. 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 4. Severe left (difficult to quantify but likely greater than 80%) and approximately 70% right proximal ICA stenosis in the neck due to atherosclerosis. 5. Small left A1 ACA, poorly opacified proximally. 6. Small to moderate bilateral pleural effusions. 7. Debris within the trachea, placing the patient at risk for aspiration. 8. Partially visualized fibrotic change in the lung apices, better characterized on prior CT chest from 2019. Critical findings were communicated on 06/24/2021 at 8:00 pm to provider Dr. BhagMaurine Minister telephone, who verbally acknowledged these results. Electronically Signed   By: FredMargaretha Sheffield.   On: 06/24/2021 20:20    Labs:  CBC: Recent Labs    10/01/20 0100 06/24/21 1914 06/24/21 1919 06/24/21 2301 06/25/21 0402  WBC  9.1 14.8*  --   --   --   HGB 11.9* 11.5* 12.9 10.9* 10.9*  HCT 36.0 35.6* 38.0 32.0* 32.0*  PLT 371 344  --   --   --      COAGS: Recent Labs    06/24/21 1914  INR 1.3*  APTT 25     BMP:  Recent Labs    10/01/20 0100 06/24/21 1914 06/24/21 1919 06/24/21 2301 06/25/21 0402  NA 135 135 136 140 139  K 4.0 3.6 3.6 3.3* 3.3*  CL 101 101 101  --   --   CO2 27 24  --   --   --   GLUCOSE 82 140* 135*  --   --   BUN '12 12 12  '$ --   --   CALCIUM 8.9 8.9  --   --   --   CREATININE 0.63 0.71 0.60  --   --   GFRNONAA >60 >60  --   --   --      LIVER FUNCTION TESTS: Recent Labs    10/01/20 0100 06/24/21 1914  BILITOT 0.5 0.8  AST 24 39  ALT 14 20  ALKPHOS 62 57  PROT 7.0 6.2*  ALBUMIN 3.5 3.1*     Assessment and Plan: Pt with hx afib (not on anticoagulation due to frequent falls), dementia, HTN, hypothyroidism, uterine cancer, admitted 9/13 with rt MCA CVA (left sided weakness); s/p cerebral arteriogram with mech thrombectomy R M1/M2 9/13 Extubated, stable on room air. Family changed code status to DNR.  Patient alert, minimally interactive, following commands.  Stable from procedure.  Groin site remains intact without issue.  Angioseal dressing may be removed.  NIR remains available as needed.    Electronically Signed: Docia Barrier, PA 06/26/2021, 12:01 PM   I spent a total of 15 minutes at the the patient's bedside AND on the patient's hospital floor or unit, greater than 50% of which was counseling/coordinating care for cerebral arteriogram with endovascular intervention

## 2021-06-27 ENCOUNTER — Inpatient Hospital Stay (HOSPITAL_COMMUNITY): Payer: Medicare Other

## 2021-06-27 DIAGNOSIS — F0390 Unspecified dementia without behavioral disturbance: Secondary | ICD-10-CM | POA: Diagnosis not present

## 2021-06-27 DIAGNOSIS — J9601 Acute respiratory failure with hypoxia: Secondary | ICD-10-CM | POA: Diagnosis not present

## 2021-06-27 DIAGNOSIS — I63511 Cerebral infarction due to unspecified occlusion or stenosis of right middle cerebral artery: Secondary | ICD-10-CM | POA: Diagnosis not present

## 2021-06-27 DIAGNOSIS — I4891 Unspecified atrial fibrillation: Secondary | ICD-10-CM

## 2021-06-27 LAB — GLUCOSE, CAPILLARY
Glucose-Capillary: 113 mg/dL — ABNORMAL HIGH (ref 70–99)
Glucose-Capillary: 114 mg/dL — ABNORMAL HIGH (ref 70–99)
Glucose-Capillary: 146 mg/dL — ABNORMAL HIGH (ref 70–99)
Glucose-Capillary: 83 mg/dL (ref 70–99)

## 2021-06-27 LAB — MAGNESIUM: Magnesium: 2.3 mg/dL (ref 1.7–2.4)

## 2021-06-27 LAB — PHOSPHORUS: Phosphorus: 2.9 mg/dL (ref 2.5–4.6)

## 2021-06-27 MED ORDER — MUPIROCIN 2 % EX OINT
1.0000 "application " | TOPICAL_OINTMENT | Freq: Two times a day (BID) | CUTANEOUS | Status: DC
  Administered 2021-06-27 – 2021-06-29 (×5): 1 via TOPICAL

## 2021-06-27 MED ORDER — OSMOLITE 1.2 CAL PO LIQD
1000.0000 mL | ORAL | Status: DC
  Administered 2021-06-27 – 2021-07-08 (×10): 1000 mL
  Filled 2021-06-27 (×2): qty 1000

## 2021-06-27 MED ORDER — PANTOPRAZOLE 2 MG/ML SUSPENSION
40.0000 mg | Freq: Every day | ORAL | Status: DC
  Administered 2021-06-28 – 2021-07-12 (×15): 40 mg
  Filled 2021-06-27 (×12): qty 20

## 2021-06-27 MED ORDER — METOPROLOL TARTRATE 5 MG/5ML IV SOLN
2.5000 mg | Freq: Three times a day (TID) | INTRAVENOUS | Status: DC
  Administered 2021-06-27: 2.5 mg via INTRAVENOUS
  Filled 2021-06-27: qty 5

## 2021-06-27 MED ORDER — PROSOURCE TF PO LIQD
45.0000 mL | Freq: Two times a day (BID) | ORAL | Status: DC
  Administered 2021-06-27 – 2021-07-11 (×29): 45 mL
  Filled 2021-06-27 (×29): qty 45

## 2021-06-27 MED ORDER — ADULT MULTIVITAMIN W/MINERALS CH
1.0000 | ORAL_TABLET | Freq: Every day | ORAL | Status: DC
  Administered 2021-06-27 – 2021-07-11 (×15): 1
  Filled 2021-06-27 (×15): qty 1

## 2021-06-27 MED ORDER — DILTIAZEM HCL 25 MG/5ML IV SOLN
10.0000 mg | INTRAVENOUS | Status: AC
  Administered 2021-06-27: 10 mg via INTRAVENOUS
  Filled 2021-06-27: qty 5

## 2021-06-27 MED ORDER — ASPIRIN 325 MG PO TABS
325.0000 mg | ORAL_TABLET | Freq: Every day | ORAL | Status: DC
  Filled 2021-06-27: qty 1

## 2021-06-27 MED ORDER — METOPROLOL TARTRATE 25 MG PO TABS
25.0000 mg | ORAL_TABLET | Freq: Two times a day (BID) | ORAL | Status: DC
  Administered 2021-06-27 (×2): 25 mg via ORAL
  Filled 2021-06-27 (×2): qty 1

## 2021-06-27 MED FILL — Medication: Qty: 1 | Status: AC

## 2021-06-27 NOTE — Progress Notes (Signed)
Initial Nutrition Assessment  DOCUMENTATION CODES:   Not applicable  INTERVENTION:   Initiate tube feeding via Cortrak tube: Osmolite 1.2 at 30 ml/h and increase by 10 ml every 8 hours to goal rate of 50 ml/h (1200 ml per day) Prosource TF 45 ml BID  Provides 1480 kcal, 88 gm protein, 973 ml free water daily  MVI with minerals daily   Monitor magnesium and phosphorus every 12 hours x 4 occurrences, MD to replete as needed, as pt is at risk for refeeding syndrome given hx of dementia.   NUTRITION DIAGNOSIS:   Inadequate oral intake related to inability to eat as evidenced by NPO status.  GOAL:   Patient will meet greater than or equal to 90% of their needs  MONITOR:   Diet advancement, TF tolerance, Labs  REASON FOR ASSESSMENT:   Consult Enteral/tube feeding initiation and management  ASSESSMENT:   Pt with HTN, dementia, and PAF not on anticoagulation due to frequent falls found down at home with L elbow laceration dx with R MCA stroke s/p IR for TICI 3 revascularization.   Spoke with RN. Plan for short term nutrition support with cortrak tube.  Suspect poor nutrition status with hx of dementia.  CCM ok to start TF today.   9/13 s/p IR, re-intubated in PACU 9/14 extubated  9/15 failed swallow eval 9/16 cortrak placed; tip gastric   Medications reviewed and include: colace, protonix, miralax Amiodarone  Labs reviewed    NUTRITION - FOCUSED PHYSICAL EXAM:  Remote  Diet Order:   Diet Order             Diet NPO time specified  Diet effective now                   EDUCATION NEEDS:   No education needs have been identified at this time  Skin:  Skin Assessment: Skin Integrity Issues: (L elbow laceration) Skin Integrity Issues:: Stage II Stage II: sacrum  Last BM:  unknown  Height:   Ht Readings from Last 1 Encounters:  06/24/21 4' 11.5" (1.511 m)    Weight:   Wt Readings from Last 1 Encounters:  06/24/21 45.4 kg    BMI:  Body mass  index is 19.86 kg/m.  Estimated Nutritional Needs:   Kcal:  1400-1600  Protein:  70-85 grams  Fluid:  >1.5 L/day  Lockie Pares., RD, LDN, CNSC See AMiON for contact information

## 2021-06-27 NOTE — Progress Notes (Signed)
Physical Therapy Treatment Patient Details Name: Dominique [name] MRN: [id number] DOB: May 23, 1933 Today's Date: 06/27/2021   History of Present Illness [name] Dominique [name] is a 87 y.o. female found down at facility, [id number] right MCA territory infarcts within the right cerebral  hemisphere and right basal ganglia. Superimposed  petechial hemorrhage within the right basal ganglia. Mild mass effect related to the right basal ganglia infarct with subtle partial effacement of the right lateral  ventricle. Punctate acute cortical infarct within the medial posterior left frontal lobe.Background mild chronic small vessel ischemic changes within the cerebral white matter.Small chronic lacunar infarct within right cerebellar hemisphere, a new finding as compared to the brain MRI of 01/26/2019.Moderate generalized cerebral atrophy. Comparatively mild cerebellar atrophy. PHMx:Artial fibrillation not on anticoagulation due to frequent falls, baseline dementia, hypertension, hypothyroidism, uterine cancer, prior thrombocytopenia resolved more recently. significant right upper extremity pain,multiple pelvic fractures and right humeral fracture in December 2020 which required significant rehabilitation.    PT Comments    Patient lethargic on arrival but arousal increased with movement. Patient required modA for bed mobility and modA+2 for sit to stand and stand pivot transfer to recliner on R. Patient with L lateral lean in sitting and standing and requires assistance to correct. Continue to recommend SNF for ongoing Physical Therapy.       Recommendations for follow up therapy are one component of a multi-disciplinary discharge planning process, led by the attending physician.  Recommendations may be updated based on patient status, additional functional criteria and insurance authorization.  Follow Up Recommendations  SNF     Equipment Recommendations  None recommended by PT    Recommendations for Other Services        Precautions / Restrictions Precautions Precautions: Fall Precaution Comments: cortrak, hx of dementia Restrictions Weight Bearing Restrictions: No     Mobility  Bed Mobility Overal bed mobility: Needs Assistance Bed Mobility: Rolling;Sidelying to Sit Rolling: Mod assist Sidelying to sit: Mod assist       General bed mobility comments: patient able to initiate bringing LEs off bed and trunk elevation. Required modA to complete both    Transfers Overall transfer level: Needs assistance Equipment used: 2 person hand held assist Transfers: Sit to/from [facility] Sit to Stand: Mod assist;+2 physical assistance;+2 safety/equipment Stand pivot transfers: Mod assist;+2 physical assistance;+2 safety/equipment       General transfer comment: modA+2 to stand from EOB with L knee blocked. ModA+2 to complete stand pivot transfer with patient able to take steps bilaterally  Ambulation/Gait                 Stairs             Wheelchair Mobility    Modified Rankin (Stroke Patients Only) Modified Rankin (Stroke Patients Only) Pre-Morbid Rankin Score: Moderately severe disability Modified Rankin: Severe disability     Balance Overall balance assessment: Needs assistance Sitting-balance support: No upper extremity supported;Feet supported Sitting balance-Leahy Scale: Poor Sitting balance - Comments: requires up to maxA to maintain sitting balance at EOB. Postural control: Left lateral lean Standing balance support: Bilateral upper extremity supported;During functional activity Standing balance-Leahy Scale: Poor Standing balance comment: maxA+2 to maintain standing                            Cognition Arousal/Alertness: Lethargic Behavior During Therapy: Flat affect Overall Cognitive Status: History of cognitive impairments - at baseline  General Comments: hx of dementia, follows  commands with increased time      Exercises      General Comments        Pertinent Vitals/Pain Pain Assessment: Faces Faces Pain Scale: No hurt Pain Intervention(s): Monitored during session    Home Living                      Prior Function            PT Goals (current goals can now be found in the care plan section) Acute Rehab PT Goals Patient Stated Goal: did not state PT Goal Formulation: Patient unable to participate in goal setting Time For Goal Achievement: 07/10/21 Potential to Achieve Goals: Fair Progress towards PT goals: Progressing toward goals    Frequency    Min 3X/week      PT Plan Current plan remains appropriate    Co-evaluation              AM-PAC PT "6 Clicks" Mobility   Outcome Measure  Help needed turning from your back to your side while in a flat bed without using bedrails?: A Lot Help needed moving from lying on your back to sitting on the side of a flat bed without using bedrails?: A Lot Help needed moving to and from a bed to a chair (including a wheelchair)?: Total Help needed standing up from a chair using your arms (e.g., wheelchair or bedside chair)?: Total Help needed to walk in hospital room?: Total Help needed climbing 3-5 steps with a railing? : Total 6 Click Score: 8    End of Session Equipment Utilized During Treatment: Gait belt Activity Tolerance: Patient tolerated treatment well Patient left: in chair;with call bell/phone within reach;with chair alarm set Nurse Communication: Mobility status PT Visit Diagnosis: Unsteadiness on feet (R26.81);Muscle weakness (generalized) (M62.81);Repeated falls (R29.6);Difficulty in walking, not elsewhere classified (R26.2)     Time: [id number] PT Time Calculation (min) (ACUTE ONLY): 24 min  Charges:  $Therapeutic Activity: 23-37 mins                     Claretta Kendra A. Gilford Rile PT, DPT Acute Rehabilitation Services Pager [phone] Office [phone]    Linna Hoff 06/27/2021, 2:30 PM

## 2021-06-27 NOTE — Progress Notes (Signed)
STROKE TEAM PROGRESS NOTE   INTERVAL HISTORY No acute events overnight.  Patient was extubated yesterday afternoon and breathing well.  She is sitting in a bedside chair. Remains hemodynamically stable. Drowsy but arousing easily to follow commands with paucity of speech noted.  She was able to state she was at [facility]. When asked how old she was she replied "Old enough". Failed swallow eval awaiting Cortrak placement. Need for feeding tube explained by Dominique. Leonie [name]. She agreed to tube placement. Dominique. Leonie [name] spoke to son via phone in the presence of bedside RN and I to give update on patient status and plan of care. He explained need for feeding tube placement if he agreed. He gave consent for placement. We discussed her plan of care  with her at the bedside as well. Her verbal responses were limited but she did appear to focus on Dominique. Leonie [name] while he was speaking with her.  Vitals:   06/27/21 0800 06/27/21 0900 06/27/21 1141 06/27/21 1200  BP: 115/72 119/67 140/85   Pulse: (!) 111 98 (!) 124   Resp: 11 10    Temp: 97.7 F (36.5 C)   98.3 F (36.8 C)  TempSrc: Axillary   Axillary  SpO2: 96% 100%    Weight:      Height:       CBC:  Recent Labs  Lab 06/24/21 1914 06/24/21 1919 06/24/21 2301 06/25/21 0402  WBC 14.8*  --   --   --   NEUTROABS 12.8*  --   --   --   HGB 11.5*   < > 10.9* 10.9*  HCT 35.6*   < > 32.0* 32.0*  MCV 104.4*  --   --   --   PLT 344  --   --   --    < > = values in this interval not displayed.   Basic Metabolic Panel:  Recent Labs  Lab 06/24/21 1914 06/24/21 1919 06/24/21 2301 06/25/21 0402 06/26/21 2226  NA 135 136   < > 139 136  K 3.6 3.6   < > 3.3* 3.8  CL 101 101  --   --  105  CO2 24  --   --   --  22  GLUCOSE 140* 135*  --   --  100*  BUN 12 12  --   --  18  CREATININE 0.71 0.60  --   --  0.78  CALCIUM 8.9  --   --   --  8.2*  MG  --   --   --   --  2.1   < > = values in this interval not displayed.    Lipid Panel:  Recent Labs  Lab  06/25/21 0538  CHOL 75  TRIG 46  50  HDL 49  CHOLHDL 1.5  VLDL 9  LDLCALC 17    HgbA1c:  Recent Labs  Lab 06/25/21 0538  HGBA1C 5.3   Urine Drug Screen: No results for input(s): LABOPIA, COCAINSCRNUR, LABBENZ, AMPHETMU, THCU, LABBARB in the last 168 hours.  Alcohol Level  Recent Labs  Lab 06/24/21 1914  ETH <10    IMAGING past 24 hours DG Abd Portable 1V  Result Date: 06/27/2021 CLINICAL DATA:  Feeding tube placement. EXAM: PORTABLE ABDOMEN - 1 VIEW COMPARISON:  None. FINDINGS: The bowel gas pattern is normal. Distal tip of feeding tube is seen in expected position of distal stomach or proximal duodenum. No radio-opaque calculi or other significant radiographic abnormality are seen. IMPRESSION: Distal  tip of feeding tube seen in expected position of distal stomach or proximal duodenum. Electronically Signed   By: Dominique [name] M.D.   On: 06/27/2021 11:47   EEG adult  Result Date: 06/26/2021 Dominique Havens, MD     06/26/2021  2:39 PM Patient Name: Dominique [name] MRN: [id number] Epilepsy Attending: Lora [name] Referring Physician/Provider: Dr Antony [name] Date: 06/26/2021 Duration: 22.46 mins Patient history: 87 year old female with right MCA infarct.  EEG to evaluate for seizures. Level of alertness: Awake AEDs during EEG study: None Technical aspects: This EEG study was done with scalp electrodes positioned according to the 10-20 International system of electrode placement. Electrical activity was acquired at a sampling rate of '500Hz'$  and reviewed with a high frequency filter of '70Hz'$  and a low frequency filter of '1Hz'$ . EEG data were recorded continuously and digitally stored. Description: No clear posterior dominant rhythm was seen. EEG showed continuous generalized and lateralized right hemisphere 3 to 5 Hz theta-delta slowing which at times appeared sharply contoured and rhythmic in the right hemisphere. Hyperventilation and photic stimulation were not performed.   ABNORMALITY -  Continuous slow, generalized and lateralized right hemisphere IMPRESSION: This study is suggestive of focal dysfunction in right hemisphere likely secondary to underlying stroke.  Additionally there is moderate diffuse encephalopathy, nonspecific etiology.  No seizures or definite epileptiform discharges were seen throughout the recording. If suspicion for ictal- interictal activity remains a concern, a prolonged study can be considered. [place]    PHYSICAL EXAM  Mental Status: Patient is drowsy but can be easily aroused.  She follows simple commands.Eyes closed but can be aroused.  Follows simple midline and right-sided commands. Cranial Nerves: II: Visual Fields are no blink to threat left greater than right though she does appear to regard examiner at times. Pupils are equal, round, and pinpoint in the bright light  III,IV, VI: EOMI without ptosis or diploplia.  V: unable to assess  VII: prior report of left facial droop, patient now intubated  VIII: hearing is intact to voice Remainder Unable to assess secondary to patient's mental status  Motor: She has a dense left hemiplegia 1/5 strength and tone is flaccid in the LUE. Trace extensor posturing in the left upper extremity, triple flexion in the left lower extremity.  she has purposeful movements at right side Localizes with the right upper extremity and spontaneously moves the right lower extremity 2/5 and briskly withdraws Sensory: She is equally responsive to noxious stimulation in all 4 extremities on grimace Cerebellar: Unable to assess secondary to patient's mental status    ASSESSMENT/PLAN Dominique [name] is a 87 y.o. female with history of  y.o. female with a past medical history significant for atrial fibrillation not on anticoagulation due to frequent falls, baseline dementia, hypertension, hypothyroidism, uterine cancer, prior thrombocytopenia resolved more recently.   History is primarily provided by the son and EMS  as well as chart review.   Son notes that she was seen by her regular nurse practitioner the day before at which time she was essentially at her baseline that she continues to struggle with significant right upper extremity pain for which some cortisone and muscle relaxer at a low dose was recommended.  There are varying reports of when she was last seen well today and what her baseline is from EMS, with a possible last known well of 1:30 or 3:30 PM and discovery of the patient down with left-sided weakness at 6:15 PM with a significant  laceration on the left upper extremity.  EMS was concerned about twitching/tremoring movements of the right upper extremity and route and did give her 2.5 mg of Versed which alleviated these movements.  Her blood pressures for them were 140s over 100s with heart rates in the 60s to 70s.  She did speak a few words to EMS initially such as saying no when she was asked if she was in pain, but then became nonverbal   At baseline the patient is continent and walks with a walker though she does have frequent falls for which reason she has been taken off of her anticoagulation.  He does not think she would be oriented to month or year at baseline and she is typically not oriented to place either, although she is otherwise conversant and is able to feed herself.  He denies any recent medical concerns or new medications other than the cortisone/muscle relaxer previously mentioned and notes she does take aspirin.   On chart review she had multiple pelvic fractures and right humeral fracture in December 2020 which required significant rehabilitation.  She was taken for mechanical thrombectomy of right M1, then right M2 with TICI 3 recanalization on 06/24/21. MRI brain today shows Acute right MCA territory infarcts within the right cerebral hemisphere and right basal ganglia,       Stroke:  right mca infarct embolic secondary to right MCA occlusion from atrial fibrillation not on  anticoagulation due to fall risk s/p mechanical thrombectomy code Stroke  CT head No acute abnormality.  Small vessel disease. Atrophy.  ASPECTS 10.     CTA head & neck  1. Occlusion of the proximal right M1 MCA. 2. Large area of right MCA territory penumbra (79 mL) without evidence of core infarct. 3. 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 4. Severe left (difficult to quantify but likely greater than 80%) and approximately 70% right proximal ICA stenosis in the neck due to atherosclerosis. 5. Small left A1 ACA, poorly opacified proximally. 6. Small to moderate bilateral pleural effusions. 7. Debris within the trachea, placing the patient at risk for aspiration. 8. Partially visualized fibrotic change in the lung apices, better     MRI   1. Acute right MCA territory infarcts within the right cerebral hemisphere and right basal ganglia, as described. Superimposed petechial hemorrhage within the right basal ganglia (Hiedelberg classifications, HI2). Mild mass effect related to the right basal ganglia infarct with subtle partial effacement of the right lateral ventricle. No midline shift. 2. Punctate acute cortical infarct within the medial posterior left frontal lobe. 3. Background mild chronic small vessel ischemic changes within the cerebral white matter. 4. Small chronic lacunar infarct within right cerebellar hemisphere, a new finding as compared to the brain MRI of 01/26/2019. 5. Moderate generalized cerebral atrophy. Comparatively mild cerebellar atrophy. MRA  post thrombectomy: 1. The M1 right middle cerebral artery remains patent status post mechanical thrombectomy performed 06/24/2021. 2. Atherosclerotic irregularity of the M2 and more distal right MCA vessels. Most notably, a moderate stenosis is present within a superior division proximal M2 right MCA vessel. 3. Enhancement within the mid-to-distal V4  left vertebral artery, likely due to retrograde flow (given unchanged apparent occlusion of this vessel more proximally).  2D Echo mild concentric hypertrophy with ejection fraction of 60 to 65%.  No wall motion abnormalities. LDL 17 HgbA1c 5.3 VTE prophylaxis - scd    Diet   Diet NPO time specified  No antithrombotic prior to admission, now on ASA by suppository. Therapy recommendations:  pending Disposition:   TBD (resided at SNF prior to admission)  Atrial Fibrillation  Not on [facility] prior to admission due to falls  Will update head CT in am to guide decision making for anticoagulation start date by Dominique. Erlinda Hong Pending Remains on amiodarone drip for RVR until po regimen can be established Continue cardiac monitoring   Dsyphagia Awaiting cortrak placement today TF regimen per RD   Hypertension Home meds:  Lopressor Stable  Permissive hypertension (OK if < 220/120) but gradually normalize in 5-7 days Long-term BP goal normotensive  Hyperlipidemia Home meds:  zocor '20mg'$ ,  resumed in hospital LDL 17, goal < 70  Continue statin at discharge  D  CBGs Recent Labs    06/26/21 0805 06/26/21 1216 06/27/21 1154  GLUCAP 82 95 83    SSI  Other Stroke Risk Factors  Advanced Age >/= 78   Hx stroke/TIA  Uterine cancer  Other [facility] day # 3  This patient was seen and evaluated with Dominique. Leonie [name]. He directed the plan of care.  Charlene Brooke, NP-C   I have personally obtained history,examined this patient, reviewed notes, independently viewed imaging studies, participated in medical decision making and plan of care.ROS completed by me personally and pertinent positives fully documented  I have made any additions or clarifications directly to the above note. Agree with note above.  Continue mobilization out of bed and ongoing therapies.  Patient unlikely to swallow hence will insert core track tube for nutrition.  Repeat CT head in a.m. and if  hemorrhagic transformation is improved will start on IV heparin for stroke prevention for A. fib.  Continue amiodarone drip for rate control and switch to oral amiodarone once she has a feeding tube.  No family available at the bedside for discussion.  I spoke to patient's son over the phone and gave him an update and answered questions.  He agrees to feeding tube This patient is critically ill and at significant risk of neurological worsening, death and care requires constant monitoring of vital signs, hemodynamics,respiratory and cardiac monitoring, extensive review of multiple databases, frequent neurological assessment, discussion with family, other specialists and medical decision making of high complexity.I have made any additions or clarifications directly to the above note.This critical care time does not reflect procedure time, or teaching time or supervisory time of PA/NP/Med Resident etc but could involve care discussion time.  I spent 30 minutes of neurocritical care time  in the care of  this patient.      Antony Contras, MD Medical Director [place] Pager: [phone] 06/27/2021 4:41 PM   To contact Stroke Continuity provider, please refer to [email or web address] After hours, contact General Neurology

## 2021-06-27 NOTE — Progress Notes (Addendum)
NAME:  Dominique [name], MRN:  [id number], DOB:  [birth year], LOS: 3 ADMISSION DATE:  06/24/2021, CONSULTATION DATE:  9/13 REFERRING MD:  Dr Curly [place], CHIEF COMPLAINT:  CVA   History of Present Illness:  Patient is encephalopathic and/or intubated. Therefore history has been obtained from chart review.    87 year old female with PMH as below, which is significant for atrial fibrillation. She is not on anticoagulation due to frequent falls. Medical history also includes dementia, HTN, hypothyroid. At baseline she is likely not oriented to time or place per son. Her last known well time is not entirely certain. Sometime between 1330 and 1530 on 9/13. She was then found on the floor with left sided weakness at around 1815 and well as laceration to the left upper extremity. EMS was called and noted twitching movements of the right upper extremity, which improved with versed. She presented to [facility] as a code stroke. Imaging consistent with R MCA acute ischemia. TPA was not given because of uncertain LKW time. She was taken to IR where TICI 3 revascularization was achieved. Post-procedurally she was extubated, but promptly decompensated, requiring re-intubation. She was transferred to ICU for ongoing care. PCCM consulted.   Pertinent  Medical History   has a past medical history of Anemia, Cancer ([place]), Hypertension, Hyponatremia, Hypothyroidism, LVH (left ventricular hypertrophy), PAF (paroxysmal atrial fibrillation) ([place]), Shingles, and Thrombocytopenia ([place]).  Significant Hospital Events:  9/13 admit R MCA stroke. To IR with TICI3. Re-intubated in PACU.  9/16 no acute events overnight, remains in A-fib with rate in the low one-teens. Able to follow intermittent commands   Imaging: CT head > No evidence of acute large vascular territory infarct or acute hemorrhage.  CT angio head > Occlusion of the proximal right M1 MCA. Large area of right MCA territory penumbra (79 mL) without evidence of core  infarct. 3. 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 4. Severe left (difficult to quantify but likely greater than 80%) and approximately 70% right proximal ICA stenosis in the neck due to atherosclerosis. Echo 9/14 > EF 60-65%  Interim History / Subjective:  Non-verbal this am but able to squeeze with right hand Remains in A-fib   Objective   Blood pressure 99/67, pulse 96, temperature 98.9 F (37.2 C), temperature source Axillary, resp. rate 11, height 4' 11.5" (1.511 m), weight 45.4 kg, SpO2 100 %.        Intake/Output Summary (Last 24 hours) at 06/27/2021 0718 Last data filed at 06/27/2021 0503 Gross per 24 hour  Intake 410.48 ml  Output 250 ml  Net 160.48 ml    Filed Weights   06/24/21 1900  Weight: 45.4 kg    Examination: General: Acute on chronically deconditioned thin ill appearing elderly female lying in bed, in NAD HEENT: [place]/AT, MM pink/moist, PERRL,  Neuro: Will open eyes to verbal and physical stimuli, able to squeeze to right hand but unable to follow any other commands  CV: s1s2 regular rate and rhythm, no murmur, rubs, or gallops,  PULM:  Clear to ascultation bilaterally, oxygen sats appropriate on 3L [place] GI: soft, bowel sounds active in all 4 quadrants, non-tender, non-distended Extremities: warm/dry, no edema  Skin: no rashes or lesions  Resolved Hospital Problem list   Hypokalemia   Assessment & Plan:   R MCA CVA -Likely cardioembolic due to AF not on AC. S/p IR revascularization (TICI 3). MRI with superimposed petechial hemorrhage in right basal  ganglia, and punctate acute cortical infarct medial posterior left frontal lobe. P: Management per neurology  Maintain neuro protective measures; goal for eurothermia, euglycemia, eunatermia, normoxia, and PCO2 goal of 35-40 Nutrition and bowel regiment  Seizure precautions  Aspirations precautions  Supportive care   Atrial fibrillation with  RVR -Not on AC at baseline due to frequent falls.  Appears resolved and currently NSR on amio maintenance gtt. P: Continuous telemetry  Continue Amio drip  Schedule low dose IV beta blocker to facilitate better rate control   Dementia P: Supportive care  Delirium precautions  Hypothyroid P: Continue home Synthroid   DNR Status Continue supportive care  At risk malnutrition  P: Order for Cortrak placed  Start TF when able   Best Practice   Diet/type: NPO. SLP eval pending DVT prophylaxis: SCD GI prophylaxis: PPI Lines: N/A Foley:  N/A Code Status:  DNR Last date of multidisciplinary goals of care discussion: Per primary    Whitney D. Kenton Kingfisher, NP-C [facility] Personal contact information can be found on Amion  06/27/2021, 7:25 AM  I agree with the Advanced Practitioner's note, impression, and recommendations as outlined. I have taken an independent interval history, reviewed the chart and examined the patient.  My medical decision making is as follows:   Subjective: Fatigued, AF with RVR on monitor. No family at bedside.   Objective: Vitals:   06/27/21 1141 06/27/21 1200  BP: 140/85 125/90  Pulse: (!) 124 (!) 131  Resp:  13  Temp:  98.3 F (36.8 C)  SpO2:  100%    Gen:       Acutely and chronically ill appearing, thin, elderly woman laying in bed Lungs:    Clear to auscultation bilaterally; normal respiratory effort, on nasal cannula CV:         irregularly irregular HR in 120s-130s Abd:      soft Ext:    No edema; adequate peripheral perfusion Skin:       Warm and dry; no rash Neuro:    nonverbal, does not consistently follow commands for me.   Labs/Imaging: Na 136, K 3.8, Cr 0.78  Assessment and Plan:  Acute Right MCA Embolic Stroke AF with RVR Dementia  Plan discussed with neurology. Patient is to continue all supportive measures up until requirement of PEG tube. Cortrak order placed. Continue amiodarone gtt and will add  metoprolol per tube for rate control. Starting tube feeds. Keep electrolytes optimized in the setting of arrhythmia K>4, Mg>2.  The patient is critically ill due to SVT, stroke.  Critical care was necessary to treat or prevent imminent or life-threatening deterioration.  Critical care was time spent personally by me on the following activities: development of treatment plan with patient and/or surrogate as well as nursing, discussions with consultants, evaluation of patient's response to treatment, examination of patient, obtaining history from patient or surrogate, ordering and performing treatments and interventions, ordering and review of laboratory studies, ordering and review of radiographic studies, pulse oximetry, re-evaluation of patient's condition and participation in multidisciplinary rounds.   Critical Care Time devoted to patient care services described in this note is 35 minutes. This time reflects time of care of this Brent . This critical care time does not reflect separately billable procedures or procedure time, teaching time or supervisory time of PA/NP/Med student/Med Resident etc but could involve care discussion time.       [facility] 06/27/2021 1:43 PM  Pager: see AMION  If no response to pager, please call critical care on call (see AMION) until 7pm After 7:00 pm call [place]

## 2021-06-27 NOTE — Procedures (Signed)
Cortrak  Person Inserting Tube:  Hetvi Shawhan, RD Tube Type:  Cortrak - 43 inches Tube Size:  10 Tube Location:  Right nare Initial Placement:  Stomach Secured by: Bridle Technique Used to Measure Tube Placement:  Marking at nare/corner of mouth Cortrak Secured At:  64 cm Procedure Comments:  Cortrak Tube Team Note:  Consult received to place a Cortrak feeding tube.   X-ray is required, abdominal x-ray has been ordered by the Cortrak team. Please confirm tube placement before using the Cortrak tube.   If the tube becomes dislodged please keep the tube and contact the Cortrak team at [email or web address] ([email or web address]) for replacement.  If after hours and replacement cannot be delayed, place a NG tube and confirm placement with an abdominal x-ray.   Mariana Single MS, RD, LDN, CNSC Clinical Nutrition Pager listed in [place]

## 2021-06-27 NOTE — Consult Note (Signed)
[place] Nurse Consult Note: Patient receiving care in [place]. Primary RN present at conclusion of my assessment. Reason for Consult: left elbow wound Wound type: trauma injury  Pressure Injury POA: Yes/No/NA Measurement: 2 cm x 4 cm x 0.6 cm Wound bed: pink Drainage (amount, consistency, odor) none Periwound: intact Dressing procedure/placement/frequency: Mupirocin ointment, apply to left elbow wound, cover with small foam dressing.  Monitor the wound area(s) for worsening of condition such as: Signs/symptoms of infection,  Increase in size,  Development of or worsening of odor, Development of pain, or increased pain at the affected locations.  Notify the medical team if any of these develop.  Thank you for the consult.  Discussed plan of care with the bedside nurse.  [place] nurse will not follow at this time.  Please re-consult the [place] team if needed.  Val Riles, RN, MSN, CWOCN, CNS-BC, pager [phone]

## 2021-06-28 ENCOUNTER — Inpatient Hospital Stay (HOSPITAL_COMMUNITY): Payer: Medicare Other

## 2021-06-28 DIAGNOSIS — I4891 Unspecified atrial fibrillation: Secondary | ICD-10-CM | POA: Diagnosis not present

## 2021-06-28 DIAGNOSIS — I63511 Cerebral infarction due to unspecified occlusion or stenosis of right middle cerebral artery: Secondary | ICD-10-CM | POA: Diagnosis not present

## 2021-06-28 DIAGNOSIS — I639 Cerebral infarction, unspecified: Secondary | ICD-10-CM | POA: Diagnosis not present

## 2021-06-28 DIAGNOSIS — J9622 Acute and chronic respiratory failure with hypercapnia: Secondary | ICD-10-CM

## 2021-06-28 DIAGNOSIS — E039 Hypothyroidism, unspecified: Secondary | ICD-10-CM

## 2021-06-28 DIAGNOSIS — J9621 Acute and chronic respiratory failure with hypoxia: Secondary | ICD-10-CM

## 2021-06-28 DIAGNOSIS — R1312 Dysphagia, oropharyngeal phase: Secondary | ICD-10-CM

## 2021-06-28 LAB — BASIC METABOLIC PANEL
Anion gap: 10 (ref 5–15)
BUN: 27 mg/dL — ABNORMAL HIGH (ref 8–23)
CO2: 23 mmol/L (ref 22–32)
Calcium: 8.3 mg/dL — ABNORMAL LOW (ref 8.9–10.3)
Chloride: 104 mmol/L (ref 98–111)
Creatinine, Ser: 0.63 mg/dL (ref 0.44–1.00)
GFR, Estimated: >60 mL/min (ref >60)
Glucose, Bld: 131 mg/dL — ABNORMAL HIGH (ref 70–99)
Potassium: 3.4 mmol/L — ABNORMAL LOW (ref 3.5–5.1)
Sodium: 137 mmol/L (ref 135–145)

## 2021-06-28 LAB — GLUCOSE, CAPILLARY
Glucose-Capillary: 129 mg/dL — ABNORMAL HIGH (ref 70–99)
Glucose-Capillary: 164 mg/dL — ABNORMAL HIGH (ref 70–99)
Glucose-Capillary: 119 mg/dL — ABNORMAL HIGH (ref 70–99)
Glucose-Capillary: 106 mg/dL — ABNORMAL HIGH (ref 70–99)
Glucose-Capillary: 146 mg/dL — ABNORMAL HIGH (ref 70–99)

## 2021-06-28 LAB — PHOSPHORUS
Phosphorus: 3.5 mg/dL (ref 2.5–4.6)
Phosphorus: 2.5 mg/dL (ref 2.5–4.6)
Phosphorus: 1.9 mg/dL — ABNORMAL LOW (ref 2.5–4.6)

## 2021-06-28 LAB — CBC
HCT: 33.9 % — ABNORMAL LOW (ref 36.0–46.0)
Hemoglobin: 11.1 g/dL — ABNORMAL LOW (ref 12.0–15.0)
MCH: 33 pg (ref 26.0–34.0)
MCHC: 32.7 g/dL (ref 30.0–36.0)
MCV: 100.9 fL — ABNORMAL HIGH (ref 80.0–100.0)
Platelets: 329 10*3/uL (ref 150–400)
RBC: 3.36 MIL/uL — ABNORMAL LOW (ref 3.87–5.11)
RDW: 15.4 % (ref 11.5–15.5)
WBC: 14.1 10*3/uL — ABNORMAL HIGH (ref 4.0–10.5)
nRBC: 0 % (ref 0.0–0.2)

## 2021-06-28 LAB — METHYLMALONIC ACID, SERUM: Methylmalonic Acid, Quantitative: 168 nmol/L (ref 0–378)

## 2021-06-28 LAB — MAGNESIUM
Magnesium: 2.3 mg/dL (ref 1.7–2.4)
Magnesium: 2 mg/dL (ref 1.7–2.4)
Magnesium: 1.9 mg/dL (ref 1.7–2.4)

## 2021-06-28 MED ORDER — SENNOSIDES-DOCUSATE SODIUM 8.6-50 MG PO TABS: 1.0000 | ORAL_TABLET | Freq: Every evening | ORAL | Status: DC | PRN

## 2021-06-28 MED ORDER — POTASSIUM CHLORIDE 20 MEQ PO PACK
20.0000 meq | PACK | ORAL | Status: AC
Start: 2021-06-28 — End: 2021-06-28
  Administered 2021-06-28 (×2): 20 meq
  Filled 2021-06-28 (×2): qty 1

## 2021-06-28 MED ORDER — METOPROLOL TARTRATE 50 MG PO TABS
50.0000 mg | ORAL_TABLET | Freq: Two times a day (BID) | ORAL | Status: DC
  Administered 2021-06-28 – 2021-07-12 (×27): 50 mg
  Filled 2021-06-28 (×28): qty 1

## 2021-06-28 MED ORDER — AMIODARONE HCL 200 MG PO TABS: 400.0000 mg | ORAL_TABLET | Freq: Every day | ORAL | Status: DC

## 2021-06-28 MED ORDER — HEPARIN SODIUM (PORCINE) 5000 UNIT/ML IJ SOLN
5000.0000 [IU] | Freq: Two times a day (BID) | INTRAMUSCULAR | Status: DC
  Administered 2021-06-28 – 2021-07-11 (×26): 5000 [IU] via SUBCUTANEOUS
  Filled 2021-06-28 (×25): qty 1

## 2021-06-28 MED ORDER — ASPIRIN 325 MG PO TABS
325.0000 mg | ORAL_TABLET | Freq: Every day | ORAL | Status: DC
  Administered 2021-06-28 – 2021-07-11 (×14): 325 mg
  Filled 2021-06-28 (×13): qty 1

## 2021-06-28 MED ORDER — LEVOTHYROXINE SODIUM 50 MCG PO TABS
50.0000 ug | ORAL_TABLET | Freq: Every day | ORAL | Status: DC
  Administered 2021-06-29 – 2021-07-10 (×12): 50 ug
  Filled 2021-06-28 (×12): qty 1

## 2021-06-28 MED ORDER — POTASSIUM CHLORIDE 20 MEQ PO PACK: 20.0000 meq | PACK | ORAL | Status: DC

## 2021-06-28 NOTE — Progress Notes (Signed)
SLP Cancellation Note  Patient Details Name: Dominique [name] MRN: [id number] DOB: 1933-02-27   Cancelled treatment:       Reason Eval/Treat Not Completed: Fatigue/lethargy limiting ability to participate  Pt remains unable to participate in PO intake due to mental status. Communicated with RN. SLP service to follow up as appropriate. NGT in place for nutrition.  Astra Gregg P. Nichola Warren, M.S., CCC-SLP Speech-Language Pathologist Acute Rehabilitation Services Pager: [place] 06/28/2021, 11:13 AM

## 2021-06-28 NOTE — Progress Notes (Addendum)
STROKE TEAM PROGRESS NOTE   INTERVAL HISTORY No acute events overnight.  Patient very lethargic, lying in bed, barely following commands, intangible words, still has left sided weakness, not blinking to visual threat on the left. CT repeat showed unchanged right MCA Infarct with petechial hemorrhage. Still has afib with intermittent RVR. On amiodarone drip. Will continue ASA at this time.   Vitals:   06/28/21 1051 06/28/21 1100 06/28/21 1200 06/28/21 1300  BP: 121/77 134/89 107/66 128/73  Pulse: (!) 115 (!) 101 100 97  Resp:  14 14 (!) 26  Temp:   98.7 F (37.1 C)   TempSrc:   Axillary   SpO2:  97% 96% 97%  Weight:      Height:       CBC:  Recent Labs  Lab 06/24/21 1914 06/24/21 1919 06/25/21 0402 06/28/21 0602  WBC 14.8*  --   --  14.1*  NEUTROABS 12.8*  --   --   --   HGB 11.5*   < > 10.9* 11.1*  HCT 35.6*   < > 32.0* 33.9*  MCV 104.4*  --   --  100.9*  PLT 344  --   --  329   < > = values in this interval not displayed.   Basic Metabolic Panel:  Recent Labs  Lab 06/26/21 2226 06/27/21 1302 06/27/21 1803 06/28/21 0602  NA 136  --   --  137  K 3.8  --   --  3.4*  CL 105  --   --  104  CO2 22  --   --  23  GLUCOSE 100*  --   --  131*  BUN 18  --   --  27*  CREATININE 0.78  --   --  0.63  CALCIUM 8.2*  --   --  8.3*  MG 2.1   < > 2.3 2.0  PHOS  --    < > 2.9 2.5   < > = values in this interval not displayed.    Lipid Panel:  Recent Labs  Lab 06/25/21 0538  CHOL 75  TRIG 46  50  HDL 49  CHOLHDL 1.5  VLDL 9  LDLCALC 17    HgbA1c:  Recent Labs  Lab 06/25/21 0538  HGBA1C 5.3   Urine Drug Screen: No results for input(s): LABOPIA, COCAINSCRNUR, LABBENZ, AMPHETMU, THCU, LABBARB in the last 168 hours.  Alcohol Level  Recent Labs  Lab 06/24/21 1914  ETH <10    IMAGING past 24 hours CT HEAD WO CONTRAST (5MM)  Result Date: 06/28/2021 CLINICAL DATA:  Stroke follow-up EXAM: CT HEAD WITHOUT CONTRAST TECHNIQUE: Contiguous axial images were obtained from  the base of the skull through the vertex without intravenous contrast. COMPARISON:  Two days ago FINDINGS: Brain: Right MCA distribution infarcts scattered along the cortex and at the basal ganglia with petechial hemorrhage at the level of the basal ganglia. Extent is underestimated relative to prior MRI. Stable local swelling. No evidence of new infarction. Generalized brain atrophy. No hydrocephalus or collection Vascular: No hyperdense vessel or unexpected calcification. Skull: Negative Sinuses/Orbits: Negative IMPRESSION: Unchanged right MCA distribution infarcts with petechial hemorrhage at the basal ganglia. Electronically Signed   By: Jorje Guild M.D.   On: 06/28/2021 05:02     PHYSICAL EXAM  Temp:  [97.5 F (36.4 C)-99.5 F (37.5 C)] 98.7 F (37.1 C) (09/17 1200) Pulse Rate:  [82-122] 97 (09/17 1300) Resp:  [10-26] 26 (09/17 1300) BP: (93-137)/(59-110) 128/73 (09/17 1300) SpO2:  [  96 %-100 %] 97 % (09/17 1300)  General - cachectic, well developed, lethargic but eyes open. Right elbow edema with small blisters. Left elbow posterior deep wound on dressing.   Ophthalmologic - fundi not visualized due to noncooperation.  Cardiovascular - irregularly irregular heart rate and rhythm with intermittent RVR  Neuro - very lethargic but eyes open, severe dysarthria with intangible words, barely understood that she was able to tell me her name but not able to understand any other answers. Able to follow limited central commands and peripheral commands on the right hand, paucity of speech, severe dysarthria. Did not name or repeat. No gaze palsy, tracking bilaterally, however, not blinking to visual threat on the left, but blinking on the right. Left facial droop. Tongue protrusion not cooperative. Right UE 3/5 bicep and in flexed position. LUE flaccid. RLE 2/5 on pain. LLE slight withdraw to pain. Sensation, coordination and gait not tested.     ASSESSMENT/PLAN Dominique [name] is a 87 y.o.  female with history of atrial fibrillation not on anticoagulation due to frequent falls, baseline dementia, hypertension, hypothyroidism, uterine cancer, prior thrombocytopenia resolved more recently admitted for left-sided weakness and aphasia.  Stroke:  right MCA scattered moderate infarcts with hemorrhagic conversion at right BG due to right MCA occlusion s/p IR with TICI3, embolic secondary to AF not on AC due to fall risk  CT head No acute abnormality.  ASPECTS 10.     CTA head & neck  1. Occlusion of the proximal right M1 MCA. 2. Large area of right MCA territory penumbra (79 mL) without evidence of core infarct. 3. 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 4. Severe left (difficult to quantify but likely greater than 80%) and approximately 70% right proximal ICA stenosis in the neck due to atherosclerosis. MRI   1. Acute right MCA territory infarcts within the right cerebral hemisphere and right basal ganglia, as described. Superimposed petechial hemorrhage within the right basal ganglia (Hiedelberg classifications, HI2). 2. Punctate acute cortical infarct within the medial posterior left frontal lobe.  MRA The M1 right middle cerebral artery remains patent status post mechanical thrombectomy performed 06/24/2021. 2. Atherosclerotic irregularity of the M2 and more distal right MCA vessels. Most notably, a moderate stenosis is present within a superior division proximal M2 right MCA vessel. CT repeat 9/17 Unchanged right MCA distribution infarcts with petechial hemorrhage at the basal ganglia. 2D Echo EF 60 to 65%.  No wall motion abnormalities. LDL 17 HgbA1c 5.3 VTE prophylaxis - heparin subq No antithrombotic prior to admission, now on ASA 325. Will consider DOAC once right BG hemorrhage resolves.  Therapy recommendations:  SNF Disposition:   pending   Atrial Fibrillation  Not on AC prior to admission due to falls  CT repeat  9/17 Unchanged right MCA distribution infarcts with petechial hemorrhage at the basal ganglia. Remains on amiodarone drip  On metoprolol 25 bid Continue cardiac monitoring  now on ASA 325. Will consider DOAC once right BG hemorrhage resolves.   Respiratory failure Intubated for procedure Extubated -> re-intubated Now re-extubated, tolerating so far CCM on board Close monitoring  Dsyphagia Speech on board On RF @ 50  Hypertension Home meds:  Lopressor Stable  Long-term BP goal normotensive  Hyperlipidemia Home meds:  zocor '20mg'$ ,  resumed in hospital LDL 17, goal < 70 Off statin due to extremely low LDL  Other Stroke Risk Factors  Advanced Age >/= 81   Hx stroke/TIA  Uterine cancer  Other Active Problems Right elbow blister Left elbow wound - wound care on board  Hospital day # 4  This patient is critically ill due to right MCA stroke due to right M1 occlusion, A. fib RVR, respiratory failure, dysphagia, hemorrhagic transformation and at significant risk of neurological worsening, death form cerebral edema, brain herniation, recurrent stroke, heart failure, respiratory failure, sepsis, seizure. This patient's care requires constant monitoring of vital signs, hemodynamics, respiratory and cardiac monitoring, review of multiple databases, neurological assessment, discussion with family, other specialists and medical decision making of high complexity. I spent 45 minutes of neurocritical care time in the care of this patient.  [name], MD PhD Stroke Neurology 06/28/2021 9:18 PM    To contact Stroke Continuity provider, please refer to [email or web address] After hours, contact General Neurology

## 2021-06-28 NOTE — Progress Notes (Signed)
NAME:  Dominique [name], MRN:  [id number], DOB:  1933-02-07, LOS: 4 ADMISSION DATE:  06/24/2021, CONSULTATION DATE:  9/13 REFERRING MD:  Dr Curly [place], CHIEF COMPLAINT:  CVA   History of Present Illness:  Patient is encephalopathic and/or intubated. Therefore history has been obtained from chart review.    87 year old female with PMH as below, which is significant for atrial fibrillation. She is not on anticoagulation due to frequent falls. Medical history also includes dementia, HTN, hypothyroid. At baseline she is likely not oriented to time or place per son. Her last known well time is not entirely certain. Sometime between 1330 and 1530 on 9/13. She was then found on the floor with left sided weakness at around 1815 and well as laceration to the left upper extremity. EMS was called and noted twitching movements of the right upper extremity, which improved with versed. She presented to [facility] as a code stroke. Imaging consistent with R MCA acute ischemia. TPA was not given because of uncertain LKW time. She was taken to IR where TICI 3 revascularization was achieved. Post-procedurally she was extubated, but promptly decompensated, requiring re-intubation. She was transferred to ICU for ongoing care. PCCM consulted.   Pertinent  Medical History   has a past medical history of Anemia, Cancer ([place]), Hypertension, Hyponatremia, Hypothyroidism, LVH (left ventricular hypertrophy), PAF (paroxysmal atrial fibrillation) ([place]), Shingles, and Thrombocytopenia ([place]).  Significant Hospital Events:  9/13 admit R MCA stroke. To IR with TICI3. Re-intubated in PACU.  9/16 no acute events overnight, remains in A-fib with rate in the low one-teens. Able to follow intermittent commands. Cortrak placed  Imaging: CT head > No evidence of acute large vascular territory infarct or acute hemorrhage.  CT angio head > Occlusion of the proximal right M1 MCA. Large area of right MCA territory penumbra (79 mL) without  evidence of core infarct. 3. 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 4. Severe left (difficult to quantify but likely greater than 80%) and approximately 70% right proximal ICA stenosis in the neck due to atherosclerosis. Echo 9/14 > EF 60-65%  Interim History / Subjective:  Resting comfortably, in AF  Objective   Blood pressure 120/86, pulse 92, temperature 99.5 F (37.5 C), temperature source Axillary, resp. rate 15, height 4' 11.5" (1.511 m), weight 45.4 kg, SpO2 96 %.        Intake/Output Summary (Last 24 hours) at 06/28/2021 0949 Last data filed at 06/28/2021 0500 Gross per 24 hour  Intake 573.5 ml  Output 440 ml  Net 133.5 ml   Filed Weights   06/24/21 1900  Weight: 45.4 kg    Examination: General: Acute on chronically deconditioned thin ill appearing elderly female lying in bed, in NAD HEENT: pale, mmm Neuro: somnolent but arousable to verbal and tactile stimuli CV: tachycardic, irregular PULM:  on [place] 3L, no wheezes or crackles GI: soft, nt, nd Extremities: warm/dry, no edema  Skin: no rashes or lesions  Resolved Hospital Problem list   Hypokalemia   Assessment & Plan:   R MCA CVA -Likely cardioembolic due to AF not on AC. S/p IR revascularization (TICI 3). MRI with superimposed petechial hemorrhage in right basal ganglia, and punctate acute cortical infarct medial posterior left frontal lobe. P: Management per neurology. Repeat imaging pending to determine need for future AC.  Maintain neuro protective measures; goal for eurothermia, euglycemia, eunatermia, normoxia, and PCO2 goal of 35-40 Nutrition and bowel regiment  Seizure  precautions  Aspirations precautions  Supportive care   Atrial fibrillation with RVR -Not on AC at baseline due to frequent falls.  Appears resolved and currently NSR on amio maintenance gtt. P: Continuous telemetry  Stop amio gtt Increase metoprolol  dosing  Dementia P: Supportive care  Delirium precautions  Hypothyroid P: Continue home Synthroid   DNR Status Continue supportive care  At risk malnutrition  P: Cortrak placed  TF at goal  Best Practice   Diet/type: NPO. TF DVT prophylaxis: SCD GI prophylaxis: PPI Lines: N/A Foley:  N/A Code Status:  DNR Last date of multidisciplinary goals of care discussion: Per primary   The patient is critically ill due to atrial fibrillation.  Critical care was necessary to treat or prevent imminent or life-threatening deterioration.  Critical care was time spent personally by me on the following activities: development of treatment plan with patient and/or surrogate as well as nursing, discussions with consultants, evaluation of patient's response to treatment, examination of patient, obtaining history from patient or surrogate, ordering and performing treatments and interventions, ordering and review of laboratory studies, ordering and review of radiographic studies, pulse oximetry, re-evaluation of patient's condition and participation in multidisciplinary rounds.   Critical Care Time devoted to patient care services described in this note is 31 minutes. This time reflects time of care of this [place] . This critical care time does not reflect separately billable procedures or procedure time, teaching time or supervisory time of PA/NP/Med student/Med Resident etc but could involve care discussion time.       Spero Geralds [place] [facility] 06/28/2021 9:50 AM  Pager: see AMION  If no response to pager , please call critical care on call (see AMION) until 7pm After 7:00 pm call Elink

## 2021-06-28 NOTE — Plan of Care (Signed)
  Problem: Self-Care: Goal: Verbalization of feelings and concerns over difficulty with self-care will improve Outcome: Progressing Goal: Ability to communicate needs accurately will improve Outcome: Progressing   Problem: Nutrition: Goal: Adequate nutrition will be maintained Outcome: Progressing   Problem: Elimination: Goal: Will not experience complications related to bowel motility Outcome: Progressing Goal: Will not experience complications related to urinary retention Outcome: Progressing   Problem: Pain Managment: Goal: General experience of comfort will improve Outcome: Progressing

## 2021-06-29 DIAGNOSIS — I4891 Unspecified atrial fibrillation: Secondary | ICD-10-CM | POA: Diagnosis not present

## 2021-06-29 DIAGNOSIS — Z7189 Other specified counseling: Secondary | ICD-10-CM

## 2021-06-29 DIAGNOSIS — I6389 Other cerebral infarction: Secondary | ICD-10-CM

## 2021-06-29 DIAGNOSIS — Z515 Encounter for palliative care: Secondary | ICD-10-CM | POA: Diagnosis not present

## 2021-06-29 DIAGNOSIS — R1312 Dysphagia, oropharyngeal phase: Secondary | ICD-10-CM | POA: Diagnosis not present

## 2021-06-29 DIAGNOSIS — J9601 Acute respiratory failure with hypoxia: Secondary | ICD-10-CM | POA: Diagnosis not present

## 2021-06-29 DIAGNOSIS — I63511 Cerebral infarction due to unspecified occlusion or stenosis of right middle cerebral artery: Secondary | ICD-10-CM | POA: Diagnosis not present

## 2021-06-29 DIAGNOSIS — I639 Cerebral infarction, unspecified: Secondary | ICD-10-CM | POA: Diagnosis not present

## 2021-06-29 LAB — CBC
HCT: 33 % — ABNORMAL LOW (ref 36.0–46.0)
Hemoglobin: 11.1 g/dL — ABNORMAL LOW (ref 12.0–15.0)
MCH: 33.7 pg (ref 26.0–34.0)
MCHC: 33.6 g/dL (ref 30.0–36.0)
MCV: 100.3 fL — ABNORMAL HIGH (ref 80.0–100.0)
Platelets: 338 10*3/uL (ref 150–400)
RBC: 3.29 MIL/uL — ABNORMAL LOW (ref 3.87–5.11)
RDW: 15.3 % (ref 11.5–15.5)
WBC: 14 10*3/uL — ABNORMAL HIGH (ref 4.0–10.5)
nRBC: 0 % (ref 0.0–0.2)

## 2021-06-29 LAB — BASIC METABOLIC PANEL
Anion gap: 5 (ref 5–15)
BUN: 24 mg/dL — ABNORMAL HIGH (ref 8–23)
CO2: 26 mmol/L (ref 22–32)
Calcium: 8.3 mg/dL — ABNORMAL LOW (ref 8.9–10.3)
Chloride: 106 mmol/L (ref 98–111)
Creatinine, Ser: 0.63 mg/dL (ref 0.44–1.00)
GFR, Estimated: >60 mL/min (ref >60)
Glucose, Bld: 139 mg/dL — ABNORMAL HIGH (ref 70–99)
Potassium: 4.3 mmol/L (ref 3.5–5.1)
Sodium: 137 mmol/L (ref 135–145)

## 2021-06-29 LAB — GLUCOSE, CAPILLARY
Glucose-Capillary: 116 mg/dL — ABNORMAL HIGH (ref 70–99)
Glucose-Capillary: 133 mg/dL — ABNORMAL HIGH (ref 70–99)
Glucose-Capillary: 136 mg/dL — ABNORMAL HIGH (ref 70–99)
Glucose-Capillary: 140 mg/dL — ABNORMAL HIGH (ref 70–99)
Glucose-Capillary: 146 mg/dL — ABNORMAL HIGH (ref 70–99)
Glucose-Capillary: 155 mg/dL — ABNORMAL HIGH (ref 70–99)

## 2021-06-29 MED ORDER — DIGOXIN 125 MCG PO TABS
0.1250 mg | ORAL_TABLET | Freq: Every day | ORAL | Status: DC
  Administered 2021-06-30 – 2021-07-12 (×13): 0.125 mg
  Filled 2021-06-29 (×13): qty 1

## 2021-06-29 MED ORDER — DIGOXIN 0.25 MG/ML IJ SOLN
0.2500 mg | Freq: Four times a day (QID) | INTRAMUSCULAR | Status: AC
  Administered 2021-06-29 – 2021-06-30 (×4): 0.25 mg via INTRAVENOUS
  Filled 2021-06-29 (×4): qty 2

## 2021-06-29 MED ORDER — DIGOXIN 0.25 MG/ML IJ SOLN: 0.2500 mg | Freq: Four times a day (QID) | INTRAMUSCULAR | Status: DC

## 2021-06-29 NOTE — Progress Notes (Signed)
STROKE TEAM PROGRESS NOTE   INTERVAL HISTORY No acute events overnight.  RN at the bedside. Dominique [name] still very lethargic, lying in bed, still has afib RVR, amiodarone has discontinued and put on digoxin. On ASA, still has left hemiplegia. Dominique [name] has increased oral/throat secretions with difficult clearance and suctioning. I had long discussion with [name] over the phone over the phone, updated Dominique [name] current condition, treatment plan and potential prognosis, and answered all the questions. He stated that Dominique [name] has no living wills due to dementia and did not do one before dementia, but he is in agreement to talk to palliative care team. Consult placed.    Vitals:   06/29/21 0900 06/29/21 0942 06/29/21 1000 06/29/21 1100  BP: 134/72 128/83 (!) 138/92 139/85  Pulse: (!) 133 (!) 130 (!) 130 100  Resp: (!) '21  19 19  '$ Temp:      TempSrc:      SpO2: 96%  97% 96%  Weight:      Height:       CBC:  Recent Labs  Lab 06/24/21 1914 06/24/21 1919 06/28/21 0602 06/29/21 0301  WBC 14.8*  --  14.1* 14.0*  NEUTROABS 12.8*  --   --   --   HGB 11.5*   < > 11.1* 11.1*  HCT 35.6*   < > 33.9* 33.0*  MCV 104.4*  --  100.9* 100.3*  PLT 344  --  329 338   < > = values in this interval not displayed.   Basic Metabolic Panel:  Recent Labs  Lab 06/28/21 0602 06/28/21 1629 06/29/21 0301  NA 137  --  137  K 3.4*  --  4.3  CL 104  --  106  CO2 23  --  26  GLUCOSE 131*  --  139*  BUN 27*  --  24*  CREATININE 0.63  --  0.63  CALCIUM 8.3*  --  8.3*  MG 2.0 1.9  --   PHOS 2.5 1.9*  --     Lipid Panel:  Recent Labs  Lab 06/25/21 0538  CHOL 75  TRIG 46  50  HDL 49  CHOLHDL 1.5  VLDL 9  LDLCALC 17    HgbA1c:  Recent Labs  Lab 06/25/21 0538  HGBA1C 5.3   Urine Drug Screen: No results for input(s): LABOPIA, COCAINSCRNUR, LABBENZ, AMPHETMU, THCU, LABBARB in the last 168 hours.  Alcohol Level  Recent Labs  Lab 06/24/21 1914  ETH <10    IMAGING past 24 hours No results found.   PHYSICAL  EXAM  Temp:  [97.7 F (36.5 C)-98.8 F (37.1 C)] 98 F (36.7 C) (09/18 0408) Pulse Rate:  [100-135] 100 (09/18 1100) Resp:  [13-29] 19 (09/18 1100) BP: (113-164)/(53-145) 139/85 (09/18 1100) SpO2:  [94 %-98 %] 96 % (09/18 1100)  General - cachectic, well developed, very lethargic but eyes open. Right elbow edema with small blisters. Left elbow posterior deep wound on dressing.   Ophthalmologic - fundi not visualized due to noncooperation.  Cardiovascular - irregularly irregular heart rate and rhythm with RVR  Neuro - very lethargic but eyes open, severe dysarthria with intangible words, only can understand when Dominique [name] tole me her name but not able to understand any other answers. Able to follow limited central commands and peripheral commands on the right hand, paucity of speech, severe dysarthria. Did not name or repeat. No gaze palsy, tracking bilaterally, however, not blinking to visual threat on the left, but blinking on the right. Left facial droop.  Tongue protrusion not cooperative. Right UE 3/5 bicep and in flexed position. LUE flaccid. RLE 2/5 on pain. LLE slight withdraw to pain. Sensation, coordination and gait not tested.     ASSESSMENT/PLAN Dominique [name] is a 87 y.o. female with history of atrial fibrillation not on anticoagulation due to frequent falls, baseline dementia, hypertension, hypothyroidism, uterine cancer, prior thrombocytopenia resolved more recently admitted for left-sided weakness and aphasia.  Stroke:  right MCA scattered moderate infarcts with hemorrhagic conversion at right BG due to right MCA occlusion s/p IR with TICI3, embolic secondary to AF not on AC due to fall risk  CT head No acute abnormality.  ASPECTS 10.     CTA head & neck  1. Occlusion of the proximal right M1 MCA. 2. Large area of right MCA territory penumbra (79 mL) without evidence of core infarct. 3. Age-indeterminate occlusion of the left vertebral artery at its origin with non opacification  in the neck. 4. Severe left (difficult to quantify but likely greater than 80%) and approximately 70% right proximal ICA stenosis in the neck due to atherosclerosis. MRI   1. Acute right MCA territory infarcts within the right cerebral hemisphere and right basal ganglia, as described. Superimposed petechial hemorrhage within the right basal ganglia (Hiedelberg classifications, HI2). 2. Punctate acute cortical infarct within the medial posterior left frontal lobe.  MRA The M1 right middle cerebral artery remains patent status post mechanical thrombectomy performed 06/24/2021. 2. Atherosclerotic irregularity of the M2 and more distal right MCA vessels. Most notably, a moderate stenosis is present within a superior division proximal M2 right MCA vessel. CT repeat 9/17 Unchanged right MCA distribution infarcts with petechial hemorrhage at the basal ganglia. 2D Echo EF 60 to 65%.  No wall motion abnormalities. LDL 17 HgbA1c 5.3 VTE prophylaxis - heparin subq No antithrombotic prior to admission, now on ASA 325. Will consider DOAC once right BG hemorrhage resolves if still aggressive care.  Therapy recommendations:  SNF Disposition:   pending, given her premorbid condition, left hemiplegia, difficulty swallowing and clearing secretions, prognosis is poor. I had long discussion with son and he is in agreement to talk to palliative care for [place] discussion. Consult placed.  Atrial Fibrillation with RVR Not on [facility] prior to admission due to falls  CT repeat 9/17 Unchanged right MCA distribution infarcts with petechial hemorrhage at the basal ganglia. On amiodarone drip -> off 9/18 On metoprolol 25 bid CCM put on digoxin Continue cardiac monitoring  now on ASA 325. Will consider DOAC once right BG hemorrhage resolves if aggressive care.   Respiratory failure Intubated for procedure Extubated -> re-intubated Now re-extubated -> still has difficulty with secretions CCM on board Close  monitoring  Dsyphagia Speech on board On TF @ 11 Likely need PEG before back to SNF  Hypertension Home meds:  Lopressor Stable  Long-term BP goal normotensive  Hyperlipidemia Home meds:  zocor '20mg'$ ,  resumed in hospital LDL 17, goal < 70 Off statin due to extremely low LDL  Other Stroke Risk Factors  Advanced Age >/= 28   Hx stroke/TIA  Uterine cancer  Other Active Problems Right elbow blister Left elbow wound - wound care on board  Hospital day # 5  This patient is critically ill due to large right MCA stroke status post thrombectomy with hemorrhagic conversion, A. fib RVR, respiratory distress with difficulty clearing secretions, dysphagia, left hemiplegia and at significant risk of neurological worsening, death form recurrent stroke, hemorrhagic conversion, brain herniation, heart failure, respiratory failure,  sepsis, seizure. This patient's care requires constant monitoring of vital signs, hemodynamics, respiratory and cardiac monitoring, review of multiple databases, neurological assessment, discussion with family, other specialists and medical decision making of high complexity. I spent 45 minutes of neurocritical care time in the care of this patient. I had long discussion with son over the phone, updated Dominique [name] current condition, treatment plan and potential prognosis, and answered all the questions.  He expressed understanding and appreciation.   [name], MD PhD Stroke Neurology 06/29/2021 1:06 PM    To contact Stroke Continuity provider, please refer to [email or web address] After hours, contact General Neurology

## 2021-06-29 NOTE — Progress Notes (Signed)
NAME:  Dominique [name], MRN:  [id number], DOB:  [birth year], LOS: 5 ADMISSION DATE:  06/24/2021, CONSULTATION DATE:  9/13 REFERRING MD:  Dr Curly [place], CHIEF COMPLAINT:  CVA   History of Present Illness:  Patient is encephalopathic and/or intubated. Therefore history has been obtained from chart review.    87 year old female with PMH as below, which is significant for atrial fibrillation. She is not on anticoagulation due to frequent falls. Medical history also includes dementia, HTN, hypothyroid. At baseline she is likely not oriented to time or place per son. Her last known well time is not entirely certain. Sometime between 1330 and 1530 on 9/13. She was then found on the floor with left sided weakness at around 1815 and well as laceration to the left upper extremity. EMS was called and noted twitching movements of the right upper extremity, which improved with versed. She presented to [facility] as a code stroke. Imaging consistent with R MCA acute ischemia. TPA was not given because of uncertain LKW time. She was taken to IR where TICI 3 revascularization was achieved. Post-procedurally she was extubated, but promptly decompensated, requiring re-intubation. She was transferred to ICU for ongoing care. PCCM consulted.   Pertinent  Medical History   has a past medical history of Anemia, Cancer ([place]), Hypertension, Hyponatremia, Hypothyroidism, LVH (left ventricular hypertrophy), PAF (paroxysmal atrial fibrillation) ([place]), Shingles, and Thrombocytopenia ([place]).  Significant Hospital Events:  9/13 admit R MCA stroke. To IR with TICI3. Re-intubated in PACU.  9/16 no acute events overnight, remains in A-fib with rate in the low one-teens. Able to follow intermittent commands. Cortrak placed  Imaging: CT head > No evidence of acute large vascular territory infarct or acute hemorrhage.  CT angio head > Occlusion of the proximal right M1 MCA. Large area of right MCA territory penumbra (79 mL) without  evidence of core infarct. 3. 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 4. Severe left (difficult to quantify but likely greater than 80%) and approximately 70% right proximal ICA stenosis in the neck due to atherosclerosis. Echo 9/14 > EF 60-65%  Interim History / Subjective:  In uncontrolled AF overnight.   Objective   Blood pressure 136/83, pulse (!) 119, temperature 98 F (36.7 C), temperature source Axillary, resp. rate (!) 29, height 4' 11.5" (1.511 m), weight 45.4 kg, SpO2 96 %.        Intake/Output Summary (Last 24 hours) at 06/29/2021 0826 Last data filed at 06/28/2021 1900 Gross per 24 hour  Intake 550 ml  Output 250 ml  Net 300 ml   Filed Weights   06/24/21 1900  Weight: 45.4 kg    Examination: General: acutely and chronically ill appearing, no distress HEENT: pale, mmm Neuro: somnolent but arousable to verbal and tactile stimuli CV: irregularly irregular, HR in 150s PULM:  no increased wob, no respiratory distress GI: soft, nt, nd Extremities: warm/dry, no edema   Resolved Hospital Problem list   Hypokalemia   Assessment & Plan:   R MCA CVA -Likely cardioembolic due to AF not on AC. S/p IR revascularization (TICI 3). MRI with superimposed petechial hemorrhage in right basal ganglia, and punctate acute cortical infarct medial posterior left frontal lobe. P: Management per neurology. Repeat imaging pending to determine need for future AC.  Maintain neuro protective measures; goal for eurothermia, euglycemia, eunatermia, normoxia, and PCO2 goal of 35-40 Nutrition and bowel regiment  Seizure precautions  Aspirations precautions  Supportive care  Atrial fibrillation with RVR, not well controlled -Not on AC at baseline due to frequent falls.   P: Continuous telemetry  Continue metoprolol. did not convert with amiodarone.  Will add digoxin today for rate control. Renal function adequate.  - K>4.  Mg>2. Reviewed today  Dementia P: Supportive care  Delirium precautions  Hypothyroid P: Continue home Synthroid   DNR Status Continue supportive care  At risk malnutrition  P: Cortrak placed  TF at goal  Best Practice   Diet/type: NPO. TF DVT prophylaxis: SCD GI prophylaxis: PPI Lines: N/A Foley:  N/A Code Status:  DNR Last date of multidisciplinary goals of care discussion: Per primary   The patient is critically ill due to AF with RVR.  Critical care was necessary to treat or prevent imminent or life-threatening deterioration.  Critical care was time spent personally by me on the following activities: development of treatment plan with patient and/or surrogate as well as nursing, discussions with consultants, evaluation of patient's response to treatment, examination of patient, obtaining history from patient or surrogate, ordering and performing treatments and interventions, ordering and review of laboratory studies, ordering and review of radiographic studies, pulse oximetry, re-evaluation of patient's condition and participation in multidisciplinary rounds.   Critical Care Time devoted to patient care services described in this note is 31 minutes. This time reflects time of care of this [place] . This critical care time does not reflect separately billable procedures or procedure time, teaching time or supervisory time of PA/NP/Med student/Med Resident etc but could involve care discussion time.       Spero Geralds [facility] 06/29/2021 8:26 AM  Pager: see AMION  If no response to pager , please call critical care on call (see AMION) until 7pm After 7:00 pm call Elink

## 2021-06-29 NOTE — Consult Note (Signed)
WOC Nurse Consult Note: Reason for Consult:Requested by Bedside RN to reassess left elbow wound. Wound type:trauma Pressure Injury POA: Yes/No/NA Measurement:Per Thursday Wound bed:red Drainage (amount, consistency, odor) moderate Periwound:macerated Dressing procedure/placement/frequency: The mupirocin ointment may be adding excessive moisture to the wound. I will add an absorbant antimicrobial (Aquacel Ag+ Advantage) to the wound care plan of care with daily changed. Securement will be with a few turns of Kerlix roll gauze/paper tape.  [place] nursing team will not follow, but will remain available to this patient, the nursing and medical teams.  Please re-consult if needed. Thanks, Maudie Flakes, MSN, RN, [place], Arther Abbott  Pager# [phone]

## 2021-06-29 NOTE — Consult Note (Addendum)
Palliative Medicine Inpatient Consult Note  Consulting Provider: [name], MD  Reason for consult:   [facility] Consult  Reason for Consult? GOC discussion   HPI:  Per intake H&P --> Dominique [name] is a 87 y.o. female with a past medical history significant for atrial fibrillation not on anticoagulation due to frequent falls, baseline dementia, hypertension, hypothyroidism, uterine cancer, prior thrombocytopenia resolved more recently.  Palliative care was asked to get involved in the setting of a right MCA infarction to discussion goals of care.  Clinical Assessment/Goals of Care:  *Please note that this is a verbal dictation therefore any spelling or grammatical errors are due to the "[place]" system interpretation.  I have reviewed medical records including EPIC notes, labs and imaging, received report from bedside RN, assessed the patient who is lying in bed gurgling, she [place].    I met with Dominique [name] (son) to further discuss diagnosis prognosis, GOC, EOL wishes, disposition and options.   I introduced Palliative Medicine as specialized medical care for people living with serious illness. It focuses on providing relief from the symptoms and stress of a serious illness. The goal is to improve quality of life for both the patient and the family.  Dominique [name] moved all around throughout her life. She settled on [place] to be closer to her son, Dominique [name]. She has been married twice. She has two sons and two grandchildren. Her other son lives in [place], [place]. 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 She is a woman who loved arts/crafts, pottery. 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 She is religious and of the [place] denomination.   Prior to hospitalization baseline of the patient was continent and able to walks with a walker. She  able to feed herself though her son arranged her  pills. She was oriented to self only at [facility] and had no short term recall.   A detailed discussion was had today regarding advanced directives, patient has no living will though has two son(s), her son who lives locally, Dominique [name] has been her Air traffic controller.    Concepts specific to code status, artifical feeding and hydration, continued IV antibiotics and rehospitalization was had.  Patient is DNAR/DNI code status. She is presently getting fed via a core-track though patients son realizes this is not a long term solution. He shares high hopes for her to be able to swallow on her own through working with speech therapy. He is more resistant towards the idea of a G-tube presently.   A brief review of patients medical conditions was had. We reviewed Dominique [name]'s h/o dementia, Afib had not been on [facility] d/t frequent falls. Discussed patients current R MCA infarction and the impact that this is likely to have on her life moving forward given that she is now hemiplegic and has severe dysphagia. We reviewed the concerns associated with her dysphagia as this is something that may not improve in time and we may see an ongoing cycle of aspiration events. We also discussed that with her present degree of debility she may end up in a situation where she is most confined to the bed/chair.   The difference between a aggressive medical intervention path  and a palliative comfort care path for this patient at this time was had. We discussed at length that is Dominique [name]'s condition worsens as opposed to improves that comfort care and hospice should be  considered. Dominique [name] is familiar with hospice from his step father and he shares that was a positive experience. We reviewed in brief the death and dying process and discussed the question of hunger at the end of life.   Ken's goals are to see how well his mother can improve at this time. He wants to see if her body can make strides overall. He is aware that she is very frail and has a  high risk for decline but he remains hopeful for improvements.   Discussed the importance of continued conversation with family and their  medical providers regarding overall plan of care and treatment options, ensuring decisions are within the context of the patients values and GOCs.  Provided "Hard Choices for [employer]" booklet.   Decision Maker: Dominique [name] (son) [phone]  SUMMARY OF RECOMMENDATIONS   DNAR/DNI  Will place a GOLD DNR form on the chart  Patients son, Dominique [name] remains hopeful for improvements at this time. Dominique [name] is cautiously optimistic, he is aware of how fragile Dominique [name] is overall and her poor likelihood for a robust recovery  Ongoing Palliative support  Code Status/Advance Care Planning: DNAR   Symptom Management:  Dysphagia: - Speech Therapy Involved - NPO Presently  - Core-track providing supplemental nutrition - Suction PRN   Muscular Weakness: - Physical Therapy Evaluation - Occupational Therapy Evaluation   Palliative Prophylaxis:  Oral Care, Mobility, Delirium  Additional Recommendations (Limitations, Scope, Preferences): Treat what is treatable   Psycho-social/Spiritual:  Desire for further Chaplaincy support: No Additional Recommendations: Education on acute stroke(s) and the prolonged period of recovery   Prognosis:   Discharge Planning:   Vitals:   06/29/21 1400 06/29/21 1500  BP: 119/77 122/77  Pulse: 96 (!) 105  Resp: (!) 21 16  Temp:    SpO2: 92% 95%    Intake/Output Summary (Last 24 hours) at 06/29/2021 1536 Last data filed at 06/29/2021 1000 Gross per 24 hour  Intake 200 ml  Output 100 ml  Net 100 ml   Last Weight  Most recent update: 06/24/2021  7:18 PM    Weight  45.4 kg (100 lb)            Gen:  Elderly F in NAD HEENT: Dry mucous membranes, (+) Coretrack CV: Irregular rate and rhythm  PULM: On RA, (+) notable sounds (+) gurgling  ABD: soft/nontender  EXT: No edema Neuro: Alert and oriented x1  PPS:  10%   This conversation/these recommendations were discussed with patient primary care team, Dr. Erlinda Hong  Time In: 1630 Time Out: 1754 Total Time: 84 Greater than 50%  of this time was spent counseling and coordinating care related to the above assessment and plan.  [place] Team Team Cell Phone: [phone] Please utilize secure chat with additional questions, if there is no response within 30 minutes please call the above phone number  Palliative Medicine Team providers are available by phone from 7am to 7pm daily and can be reached through the team cell phone.  Should this patient require assistance outside of these hours, please call the patient's attending physician.

## 2021-06-29 NOTE — Progress Notes (Signed)
SLP Cancellation Note  Patient Details Name: Dominique [name] MRN: [id number] DOB: [birth year]   Cancelled treatment:       Reason Eval/Treat Not Completed: Fatigue/lethargy limiting ability to participate. Per nursing, patient continues to be very lethargic. SLP will continue to follow for readiness for BSE.  Sonia Baller, MA, CCC-SLP Speech Therapy

## 2021-06-30 DIAGNOSIS — I63511 Cerebral infarction due to unspecified occlusion or stenosis of right middle cerebral artery: Secondary | ICD-10-CM | POA: Diagnosis not present

## 2021-06-30 DIAGNOSIS — Z66 Do not resuscitate: Secondary | ICD-10-CM | POA: Diagnosis not present

## 2021-06-30 DIAGNOSIS — Z515 Encounter for palliative care: Secondary | ICD-10-CM | POA: Diagnosis not present

## 2021-06-30 DIAGNOSIS — I639 Cerebral infarction, unspecified: Secondary | ICD-10-CM | POA: Diagnosis not present

## 2021-06-30 DIAGNOSIS — J9601 Acute respiratory failure with hypoxia: Secondary | ICD-10-CM | POA: Diagnosis not present

## 2021-06-30 DIAGNOSIS — I4891 Unspecified atrial fibrillation: Secondary | ICD-10-CM | POA: Diagnosis not present

## 2021-06-30 LAB — BASIC METABOLIC PANEL WITH GFR
Anion gap: 7 (ref 5–15)
BUN: 23 mg/dL (ref 8–23)
CO2: 26 mmol/L (ref 22–32)
Calcium: 8.7 mg/dL — ABNORMAL LOW (ref 8.9–10.3)
Chloride: 103 mmol/L (ref 98–111)
Creatinine, Ser: 0.56 mg/dL (ref 0.44–1.00)
GFR, Estimated: >60 mL/min
Glucose, Bld: 122 mg/dL — ABNORMAL HIGH (ref 70–99)
Potassium: 4.9 mmol/L (ref 3.5–5.1)
Sodium: 136 mmol/L (ref 135–145)

## 2021-06-30 LAB — GLUCOSE, CAPILLARY
Glucose-Capillary: 103 mg/dL — ABNORMAL HIGH (ref 70–99)
Glucose-Capillary: 117 mg/dL — ABNORMAL HIGH (ref 70–99)
Glucose-Capillary: 118 mg/dL — ABNORMAL HIGH (ref 70–99)
Glucose-Capillary: 135 mg/dL — ABNORMAL HIGH (ref 70–99)
Glucose-Capillary: 135 mg/dL — ABNORMAL HIGH (ref 70–99)
Glucose-Capillary: 139 mg/dL — ABNORMAL HIGH (ref 70–99)
Glucose-Capillary: 141 mg/dL — ABNORMAL HIGH (ref 70–99)

## 2021-06-30 LAB — CBC
HCT: 36.7 % (ref 36.0–46.0)
Hemoglobin: 12.5 g/dL (ref 12.0–15.0)
MCH: 34.5 pg — ABNORMAL HIGH (ref 26.0–34.0)
MCHC: 34.1 g/dL (ref 30.0–36.0)
MCV: 101.4 fL — ABNORMAL HIGH (ref 80.0–100.0)
Platelets: 347 10*3/uL (ref 150–400)
RBC: 3.62 MIL/uL — ABNORMAL LOW (ref 3.87–5.11)
RDW: 15.5 % (ref 11.5–15.5)
WBC: 15.4 10*3/uL — ABNORMAL HIGH (ref 4.0–10.5)
nRBC: 0 % (ref 0.0–0.2)

## 2021-06-30 MED ORDER — CHLORHEXIDINE GLUCONATE CLOTH 2 % EX PADS
6.0000 | MEDICATED_PAD | Freq: Every day | CUTANEOUS | Status: DC
  Administered 2021-06-30 – 2021-07-11 (×10): 6 via TOPICAL

## 2021-06-30 NOTE — Progress Notes (Signed)
Speech Language Pathology Treatment: Dysphagia  Patient Details Name: Dominique [name] MRN: [id number] DOB: [birth year] Today's Date: 06/30/2021 Time: [id number] SLP Time Calculation (min) (ACUTE ONLY): 8 min  Assessment / Plan / Recommendation Clinical Impression  Pt in bed with open mouth posture. She is awake, responding with decreased intelligibility to majority of questions. Limited lingual and labial movement; She has decreased saliva management and in addition to suspected added penetration after oral care. Vocal quality wet with reflexive throat clearing present. A wet spoon was presented resulting in minimal labial closure. PO's not given due to lack of ability to orally control and risk of aspiration in this deconditioned patient. Pt stated "no" when asked if she wanted a sip of water. Family not present. Her prognosis for swallow function in guarded. Palliative care involved and if she improves, is able orally manipulate po's and pt/family desires small amount of moisture, nursing can attempt small amount of water. At present time ST will sign off. Please reconsult if needed.    HPI HPI: 87 yo F with PMHx significant for atrial fibrillation, baseline dementia, HTN, hypothyroidism, uterine cancer, and prior thrombocytopenia. Pt presented to ED with left-sided weakness and signifcant laceration of left upper extremity. Imaging revealed acute R MCA infarct involving right basal ganglia with mild petechial hemorrhage in the right putamen. Intubated 9/13 for angiogram with one way extubation 9/14 and swallow assessment ordered as pt is awake and following some commands.      SLP Plan  Discharge SLP treatment due to (comment) (no progress toward goals)      Recommendations for follow up therapy are one component of a multi-disciplinary discharge planning process, led by the attending physician.  Recommendations may be updated based on patient status, additional functional criteria and insurance  authorization.    Recommendations  Diet recommendations: NPO Medication Administration: Via alternative means                Oral Care Recommendations: Oral care QID SLP Visit Diagnosis: Dysphagia, unspecified (R13.10) Plan: Discharge SLP treatment due to (comment) (no progress toward goals)                         06/30/2021, 1:49 PM  Orbie Pyo Floree Zuniga M.Ed Risk analyst [phone] Office [phone]

## 2021-06-30 NOTE — Progress Notes (Signed)
STROKE TEAM PROGRESS NOTE   INTERVAL HISTORY No family at the bedside. Pt lying in bed, no acute event overnight. HR low 60s but in NSR. Now on digoxin. More secretions in the throat and hard to cough it out. Severe dysarthria. Palliative care on board, pt DNR and DNI at this time. On TF.    Vitals:   06/30/21 0700 06/30/21 0800 06/30/21 0900 06/30/21 1057  BP: (!) 156/76  (!) 137/55   Pulse: (!) 58  (!) 58 63  Resp: 20  16   Temp:  98.7 F (37.1 C)    TempSrc:  Axillary    SpO2: 96%  96%   Weight:      Height:       CBC:  Recent Labs  Lab 06/24/21 1914 06/24/21 1919 06/29/21 0301 06/30/21 0309  WBC 14.8*   < > 14.0* 15.4*  NEUTROABS 12.8*  --   --   --   HGB 11.5*   < > 11.1* 12.5  HCT 35.6*   < > 33.0* 36.7  MCV 104.4*   < > 100.3* 101.4*  PLT 344   < > 338 347   < > = values in this interval not displayed.   Basic Metabolic Panel:  Recent Labs  Lab 06/28/21 0602 06/28/21 1629 06/29/21 0301 06/30/21 0550  NA 137  --  137 136  K 3.4*  --  4.3 4.9  CL 104  --  106 103  CO2 23  --  26 26  GLUCOSE 131*  --  139* 122*  BUN 27*  --  24* 23  CREATININE 0.63  --  0.63 0.56  CALCIUM 8.3*  --  8.3* 8.7*  MG 2.0 1.9  --   --   PHOS 2.5 1.9*  --   --     Lipid Panel:  Recent Labs  Lab 06/25/21 0538  CHOL 75  TRIG 46  50  HDL 49  CHOLHDL 1.5  VLDL 9  LDLCALC 17    HgbA1c:  Recent Labs  Lab 06/25/21 0538  HGBA1C 5.3   Urine Drug Screen: No results for input(s): LABOPIA, COCAINSCRNUR, LABBENZ, AMPHETMU, THCU, LABBARB in the last 168 hours.  Alcohol Level  Recent Labs  Lab 06/24/21 1914  ETH <10    IMAGING past 24 hours No results found.   PHYSICAL EXAM  Temp:  [97.9 F (36.6 C)-98.8 F (37.1 C)] 98.7 F (37.1 C) (09/19 0800) Pulse Rate:  [55-110] 63 (09/19 1057) Resp:  [15-32] 16 (09/19 0900) BP: (92-169)/(45-80) 137/55 (09/19 0900) SpO2:  [92 %-97 %] 96 % (09/19 0900) Weight:  [51.3 kg] 51.3 kg (09/19 0411)  General - cachectic, well  developed, lethargic but eyes open. Right elbow edema with small blisters. Left elbow posterior deep wound on dressing.   Ophthalmologic - fundi not visualized due to noncooperation.  Cardiovascular - regular heart rate and rhythm today, not in afib  Neuro - lethargic but eyes open on voice, severe dysarthria with intangible words. Able to follow limited central commands and peripheral commands on the right hand, paucity of speech, severe dysarthria. Did not name or repeat. No gaze palsy, tracking bilaterally, however, not blinking to visual threat on the left, but blinking on the right. Left facial droop. Tongue protrusion not cooperative. Right UE 3/5 bicep and in flexed position. LUE flaccid. RLE 2/5 proximal and distal toe DF/PF 3/5. LLE proximal 1/5 and distal toe DF/PF 2/5. Sensation, coordination not cooperative and gait not tested.  ASSESSMENT/PLAN Ms. TATYANAH FRANE is a 87 y.o. female with history of atrial fibrillation not on anticoagulation due to frequent falls, baseline dementia, hypertension, hypothyroidism, uterine cancer, prior thrombocytopenia resolved more recently admitted for left-sided weakness and aphasia.  Stroke:  right MCA scattered moderate infarcts with hemorrhagic conversion at right BG due to right MCA occlusion s/p IR with TICI3, embolic secondary to AF not on AC due to fall risk  CT head No acute abnormality.  ASPECTS 10.     CTA head & neck  1. Occlusion of the proximal right M1 MCA. 2. Large area of right MCA territory penumbra (79 mL) without evidence of core infarct. 3. 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 4. Severe left (difficult to quantify but likely greater than 80%) and approximately 70% right proximal ICA stenosis in the neck due to atherosclerosis. MRI   1. Acute right MCA territory infarcts within the right cerebral hemisphere and right basal ganglia, as described. Superimposed  petechial hemorrhage within the right basal ganglia (Hiedelberg classifications, HI2). 2. Punctate acute cortical infarct within the medial posterior left frontal lobe.  MRA The M1 right middle cerebral artery remains patent status post mechanical thrombectomy performed 06/24/2021. 2. Atherosclerotic irregularity of the M2 and more distal right MCA vessels. Most notably, a moderate stenosis is present within a superior division proximal M2 right MCA vessel. CT repeat 9/17 Unchanged right MCA distribution infarcts with petechial hemorrhage at the basal ganglia. 2D Echo EF 60 to 65%.  No wall motion abnormalities. LDL 17 HgbA1c 5.3 VTE prophylaxis - heparin subq No antithrombotic prior to admission, now on ASA 325. Will consider DOAC once right BG hemorrhage resolves if still aggressive care.  Therapy recommendations:  SNF Disposition:   pending, palliative care on board, currently DNR/DNI  Atrial Fibrillation with RVR Not on [facility] prior to admission due to falls  CT repeat 9/17 Unchanged right MCA distribution infarcts with petechial hemorrhage at right BG. On amiodarone drip -> off 9/18 On metoprolol 25 bid On digoxin Continue cardiac monitoring -> NSR today with HR 60s now on ASA 325. Will consider DOAC once right BG hemorrhage resolves if aggressive care.   Respiratory failure Intubated for procedure Extubated -> re-intubated Now re-extubated -> still has difficulty with secretions CCM signed off DNR/DNI Close monitoring  Dsyphagia Speech on board On TF @ 77 Likely need PEG before back to SNF  Hypertension Home meds:  Lopressor Stable  Long-term BP goal normotensive  Hyperlipidemia Home meds:  zocor '20mg'$ ,  resumed in hospital LDL 17, goal < 70 Off statin due to extremely low LDL  Other Stroke Risk Factors  Advanced Age >/= 55   Hx stroke/TIA  Uterine cancer  Other Active Problems Right elbow blister Left elbow wound - wound care on board  Hospital day # 6  This  patient is critically ill due to large right MCA stroke status post thrombectomy, right BG hemorrhagic conversion, A. fib RVR, respiratory distress with difficulty handling secretions, dysphagia and at significant risk of neurological worsening, death form recurrent stroke, hemorrhagic conversion, cerebral edema, heart failure, respiratory failure, sepsis. This patient's care requires constant monitoring of vital signs, hemodynamics, respiratory and cardiac monitoring, review of multiple databases, neurological assessment, discussion with family, other specialists and medical decision making of high complexity. I spent 40 minutes of neurocritical care time in the care of this patient.  I discussed with Dr. Tacy Learn CCM.   [name], MD PhD  Stroke Neurology 06/30/2021 11:20 AM    To contact Stroke Continuity provider, please refer to [email or web address] After hours, contact General Neurology

## 2021-06-30 NOTE — Progress Notes (Signed)
   Palliative Medicine Inpatient Follow Up Note  Consulting Provider: [name], MD   Reason for consult:   [place] Palliative Medicine Consult  Reason for Consult? GOC discussion    HPI:  Per intake H&P --> Dominique [name] is a 87 y.o. female with a past medical history significant for atrial fibrillation not on anticoagulation due to frequent falls, baseline dementia, hypertension, hypothyroidism, uterine cancer, prior thrombocytopenia resolved more recently.   Palliative care was asked to get involved in the setting of a right MCA infarction to discussion goals of care.  Today's Discussion (06/30/2021):  *Please note that this is a verbal dictation therefore any spelling or grammatical errors are due to the "[employer]" system interpretation.  Chart reviewed. HR appears to be under better control today.   Per secure chat with the nursing staff there are not any significant changes and Dominique [name] remains steadily unwell.   I met with Dominique [name] at bedside. She is alert to self and able to lip a few indiscernible words. She still sounds rhonchorus and has great secretion burden. She otherwise exemplifies no signs of distress as per nonverbal indicators.   There is no family present at bedside upon my time of assessment.   Questions and concerns addressed   Objective Assessment: Vital Signs Vitals:   06/30/21 1100 06/30/21 1200  BP: (!) 147/66 (!) 123/53  Pulse: 64 61  Resp: (!) 23 19  Temp:  98.3 F (36.8 C)  SpO2: 96% 96%    Intake/Output Summary (Last 24 hours) at 06/30/2021 1409 Last data filed at 06/30/2021 1100 Gross per 24 hour  Intake 1350 ml  Output 800 ml  Net 550 ml   Last Weight  Most recent update: 06/30/2021  4:11 AM    Weight  51.3 kg (113 lb 1.5 oz)            Gen:  Elderly F unwell appearing HEENT: Dry mucous membranes, (+) Coretrack CV: Irregular rate and rhythm  PULM: On RA, (+) notable sounds (+) gurgling  ABD:  soft/nontender  EXT: No edema Neuro: A+O x1  SUMMARY OF RECOMMENDATIONS   DNAR/DNI   Will place a GOLD DNR form on the chart   Patients son, Dominique [name] remains hopeful for improvements at this time. Dominique [name] is cautiously optimistic, he is aware of how fragile Dominique [name] is overall and her poor likelihood for a robust recovery   We will shadow the chart and provide palliative support in the oncoming days   Code Status/Advance Care Planning: DNAR   Symptom Management:  Dysphagia: - Speech Therapy Involved - NPO Presently  - Core-track providing supplemental nutrition - Suction PRN   Muscular Weakness: - Physical Therapy Evaluation - Occupational Therapy Evaluation  Time Spent: 25 Greater than 50% of the time was spent in counseling and coordination of care ______________________________________________________________________________________ [facility] Team Team Cell Phone: [phone] Please utilize secure chat with additional questions, if there is no response within 30 minutes please call the above phone number  Palliative Medicine Team providers are available by phone from 7am to 7pm daily and can be reached through the team cell phone.  Should this patient require assistance outside of these hours, please call the patient's attending physician.

## 2021-06-30 NOTE — Progress Notes (Signed)
NAME:  UBAH RADKE, MRN:  [id number], DOB:  [birth year], LOS: 6 ADMISSION DATE:  06/24/2021, CONSULTATION DATE:  9/13 REFERRING MD:  Dr Curly [place], CHIEF COMPLAINT:  CVA   History of Present Illness:  Patient is encephalopathic and/or intubated. Therefore history has been obtained from chart review.    87 year old female with PMH as below, which is significant for atrial fibrillation. She is not on anticoagulation due to frequent falls. Medical history also includes dementia, HTN, hypothyroid. At baseline she is likely not oriented to time or place per son. Her last known well time is not entirely certain. Sometime between 1330 and 1530 on 9/13. She was then found on the floor with left sided weakness at around 1815 and well as laceration to the left upper extremity. EMS was called and noted twitching movements of the right upper extremity, which improved with versed. She presented to [facility] as a code stroke. Imaging consistent with R MCA acute ischemia. TPA was not given because of uncertain LKW time. She was taken to IR where TICI 3 revascularization was achieved. Post-procedurally she was extubated, but promptly decompensated, requiring re-intubation. She was transferred to ICU for ongoing care. PCCM consulted.   Pertinent  Medical History   has a past medical history of Anemia, Cancer ([place]), Hypertension, Hyponatremia, Hypothyroidism, LVH (left ventricular hypertrophy), PAF (paroxysmal atrial fibrillation) ([place]), Shingles, and Thrombocytopenia ([place]).  Significant Hospital Events:  9/13 admit R MCA stroke. To IR with TICI3. Re-intubated in PACU.  9/16 no acute events overnight, remains in A-fib with rate in the low one-teens. Able to follow intermittent commands. Cortrak placed  Imaging: CT head > No evidence of acute large vascular territory infarct or acute hemorrhage.  CT angio head > Occlusion of the proximal right M1 MCA. Large area of right MCA territory penumbra (79 mL) without  evidence of core infarct. 3. 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 4. Severe left (difficult to quantify but likely greater than 80%) and approximately 70% right proximal ICA stenosis in the neck due to atherosclerosis. Echo 9/14 > EF 60-65%  Interim History / Subjective:  HR improved with dig, currently 60s  Minimally responsive  Objective   Blood pressure (!) 169/70, pulse 60, temperature 97.9 F (36.6 C), temperature source Axillary, resp. rate 17, height 4' 11.5" (1.511 m), weight 51.3 kg, SpO2 96 %.        Intake/Output Summary (Last 24 hours) at 06/30/2021 0732 Last data filed at 06/30/2021 0600 Gross per 24 hour  Intake 1750 ml  Output 900 ml  Net 850 ml    Filed Weights   06/24/21 1900 06/30/21 0411  Weight: 45.4 kg 51.3 kg    Examination: General: acutely and chronically ill appearing, no distress HEENT: pale, mmm Neuro: Minimally responsive, groans, withdraws on R CV: IRIR, rate 60s PULM:  Normal effort, CTAB GI: soft, nt, nd Extremities: warm/dry, no edema   Resolved Hospital Problem list   Hypokalemia   Assessment & Plan:   R MCA CVA -Likely cardioembolic due to AF not on AC. S/p IR revascularization (TICI 3). MRI with superimposed petechial hemorrhage in right basal ganglia, and punctate acute cortical infarct medial posterior left frontal lobe. P: Management per neurology. Repeat imaging pending to determine need for future AC.  Maintain neuro protective measures; goal for eurothermia, euglycemia, eunatermia, normoxia, and PCO2 goal of 35-40 Nutrition and bowel regiment  Seizure precautions  Aspirations precautions  Supportive  care   Atrial fibrillation with RVR, difficulty with rate control but improved after Dig added 9/18.  Did not maintain with amiodarone alone prior. -Not on AC at baseline due to frequent falls.   P: Continuous telemetry  Continue metoprolol, Digoxin - K>4.  Mg>2  Dementia P: Supportive care  Delirium precautions  Hypothyroid P: Continue home Synthroid   DNR Status Continue supportive care  At risk malnutrition - s/p Cortrak P: Continue TF's  DNR / DNI per discussions with palliative care. Unfortunately poor prognosis. Continue supportive care and if no improvement over next few days might need palliative vs hospice.  Rest per primary.  Nothing else to offer from our standpoint.  PCCM will sign off, please call back if we can be of further assistance.   Montey Hora, [facility] For pager details, please see AMION or use Epic chat  After 1900, please call [facility] for cross coverage needs 06/30/2021, 7:39 AM

## 2021-07-01 DIAGNOSIS — J9601 Acute respiratory failure with hypoxia: Secondary | ICD-10-CM | POA: Diagnosis not present

## 2021-07-01 DIAGNOSIS — I4891 Unspecified atrial fibrillation: Secondary | ICD-10-CM | POA: Diagnosis not present

## 2021-07-01 DIAGNOSIS — I639 Cerebral infarction, unspecified: Secondary | ICD-10-CM | POA: Diagnosis not present

## 2021-07-01 DIAGNOSIS — I63511 Cerebral infarction due to unspecified occlusion or stenosis of right middle cerebral artery: Secondary | ICD-10-CM | POA: Diagnosis not present

## 2021-07-01 LAB — GLUCOSE, CAPILLARY
Glucose-Capillary: 136 mg/dL — ABNORMAL HIGH (ref 70–99)
Glucose-Capillary: 149 mg/dL — ABNORMAL HIGH (ref 70–99)
Glucose-Capillary: 112 mg/dL — ABNORMAL HIGH (ref 70–99)
Glucose-Capillary: 131 mg/dL — ABNORMAL HIGH (ref 70–99)
Glucose-Capillary: 120 mg/dL — ABNORMAL HIGH (ref 70–99)

## 2021-07-01 LAB — CBC
HCT: 31.2 % — ABNORMAL LOW (ref 36.0–46.0)
Hemoglobin: 10.3 g/dL — ABNORMAL LOW (ref 12.0–15.0)
MCH: 33.3 pg (ref 26.0–34.0)
MCHC: 33 g/dL (ref 30.0–36.0)
MCV: 101 fL — ABNORMAL HIGH (ref 80.0–100.0)
Platelets: 352 10*3/uL (ref 150–400)
RBC: 3.09 MIL/uL — ABNORMAL LOW (ref 3.87–5.11)
RDW: 14.8 % (ref 11.5–15.5)
WBC: 14.5 10*3/uL — ABNORMAL HIGH (ref 4.0–10.5)
nRBC: 0 % (ref 0.0–0.2)

## 2021-07-01 LAB — BASIC METABOLIC PANEL
Anion gap: 8 (ref 5–15)
BUN: 24 mg/dL — ABNORMAL HIGH (ref 8–23)
CO2: 25 mmol/L (ref 22–32)
Calcium: 8.5 mg/dL — ABNORMAL LOW (ref 8.9–10.3)
Chloride: 100 mmol/L (ref 98–111)
Creatinine, Ser: 0.47 mg/dL (ref 0.44–1.00)
GFR, Estimated: >60 mL/min (ref >60)
Glucose, Bld: 128 mg/dL — ABNORMAL HIGH (ref 70–99)
Potassium: 4.6 mmol/L (ref 3.5–5.1)
Sodium: 133 mmol/L — ABNORMAL LOW (ref 135–145)

## 2021-07-01 MED ORDER — CIPROFLOXACIN HCL 0.3 % OP SOLN
1.0000 [drp] | Freq: Four times a day (QID) | OPHTHALMIC | Status: DC
  Administered 2021-07-01 – 2021-07-11 (×39): 1 [drp] via OPHTHALMIC
  Filled 2021-07-01 (×3): qty 2.5

## 2021-07-01 NOTE — Progress Notes (Signed)
Occupational Therapy Treatment Patient Details Name: Dominique [name] MRN: [id number] DOB: 09-Feb-1933 Today's Date: 07/01/2021   History of present illness Dominique [name] is a 87 y.o. female found down at facility and admitted on 06/24/21 with CVA.  MRI revealed R MCA infarcts within R cerebral hemisphere and R basal ganglia, Superimposed  petechial hemorrhage within the right basal ganglia. Mild mass effect related to the right basal ganglia infarct with subtle partial effacement of the right lateral  ventricle. Punctate acute cortical infarct within the medial posterior left frontal lobe. PHMx:Artial fibrillation not on anticoagulation due to frequent falls, baseline dementia, hypertension, hypothyroidism, uterine cancer, prior thrombocytopenia resolved more recently. significant right upper extremity pain,multiple pelvic fractures and right humeral fracture in December 2020 which required significant rehabilitation.   OT comments  Pt incontinence with total (A) for all peri care in the bed. Pt fatigued with rolling required to do hygiene. Pt transferred to chair this session and reports increased comfort. Recommending pravalon bil LE, elevation for L elbow edema and frequent repositioning due to sacral breakdown noted. Recommendation for SNf at this time.    Recommendations for follow up therapy are one component of a multi-disciplinary discharge planning process, led by the attending physician.  Recommendations may be updated based on patient status, additional functional criteria and insurance authorization.    Follow Up Recommendations  SNF;Supervision/Assistance - 24 hour    Equipment Recommendations  Wheelchair (measurements OT);Wheelchair cushion (measurements OT);Hospital bed;Other (comment) (lift)    Recommendations for Other Services      Precautions / Restrictions Precautions Precautions: Fall Precaution Comments: cortrak, hx of dementia       Mobility Bed Mobility Overal bed  mobility: Needs Assistance Bed Mobility: Rolling;Sidelying to Sit Rolling: Max assist;+2 for safety/equipment Sidelying to sit: Max assist;+2 for safety/equipment       General bed mobility comments: Rolling to both sides for cleaning (pt with loose bm); then max A to lift trunk to sit EOB    Transfers Overall transfer level: Needs assistance Equipment used: 2 person hand held assist Transfers: Sit to/from [facility] Sit to Stand: +2 physical assistance;Max assist Stand pivot transfers: Max assist;+2 physical assistance       General transfer comment: Stand pivot to R side with max x 2 using gait belt and bed pad to facilitate; pt lethargic/.fatigued after rolling to clean    Balance Overall balance assessment: Needs assistance Sitting-balance support: No upper extremity supported;Feet supported Sitting balance-Leahy Scale: Poor Sitting balance - Comments: Requring mod A at EOB, tending to lean whichever direction she was positioned, poor trunk control Postural control: Left lateral lean Standing balance support: Bilateral upper extremity supported;During functional activity Standing balance-Leahy Scale: Zero Standing balance comment: maxA+2 to maintain standing                           ADL either performed or assessed with clinical judgement   ADL Overall ADL's : Needs assistance/impaired Eating/Feeding: NPO   Grooming: Sitting;Total assistance;Wash/dry face Grooming Details (indicate cue type and reason): total (A) hand over ahnd Upper Body Bathing: Total assistance   Lower Body Bathing: Total assistance   Upper Body Dressing : Total assistance   Lower Body Dressing: Total assistance     Toilet Transfer Details (indicate cue type and reason): incontinence and lack of awareness           General ADL Comments: pt transfered to chair and expressed increased comfort. pt with  skin break down noted at buttock so recommendation back to bed  with lift at 2 hours max mark     Vision       Perception     Praxis      Cognition Arousal/Alertness: Lethargic Behavior During Therapy: Flat affect Overall Cognitive Status: No family/caregiver present to determine baseline cognitive functioning                                 General Comments: Hx of dementia; followed limited commands today with multimodal cues (lethargic)        Exercises General Exercises - Lower Extremity Long Arc Quad: AROM;Both;5 reps;Seated (minimal motion on L)   Shoulder Instructions       General Comments ON RA VSS corktrak reconnected i the chair. pt with hoyer pad for RN transfer back to bed.    Pertinent Vitals/ Pain       Pain Assessment: Faces Faces Pain Scale: Hurts little more Pain Location: generalized - during transfers Pain Descriptors / Indicators: Discomfort Pain Intervention(s): Limited activity within patient's tolerance;Repositioned;Premedicated before session  Home Living                                          Prior Functioning/Environment              Frequency  Min 2X/week        Progress Toward Goals  OT Goals(current goals can now be found in the care plan section)  Progress towards OT goals: Progressing toward goals;Goals drowngraded-see care plan  Acute Rehab OT Goals Patient Stated Goal: did not state OT Goal Formulation: Patient unable to participate in goal setting Time For Goal Achievement: 07/10/21 Potential to Achieve Goals: Fair ADL Goals Pt Will Perform Eating: with mod assist;with adaptive utensils;sitting Pt Will Perform Grooming: with max assist Pt Will Transfer to Toilet: stand pivot transfer;bedside commode;with mod assist Additional ADL Goal #1: Pt will be max A in and OOB for basic ADLs  Plan Discharge plan remains appropriate    Co-evaluation    PT/OT/SLP Co-Evaluation/Treatment: Yes Reason for Co-Treatment: Complexity of the patient's  impairments (multi-system involvement);Necessary to address cognition/behavior during functional activity;For patient/therapist safety;To address functional/ADL transfers PT goals addressed during session: Mobility/safety with mobility;Balance;Strengthening/ROM OT goals addressed during session: ADL's and self-care;Proper use of Adaptive equipment and DME;Strengthening/ROM      AM-PAC OT "6 Clicks" Daily Activity     Outcome Measure   Help from another person eating meals?: Total Help from another person taking care of personal grooming?: Total Help from another person toileting, which includes using toliet, bedpan, or urinal?: Total Help from another person bathing (including washing, rinsing, drying)?: Total Help from another person to put on and taking off regular upper body clothing?: Total Help from another person to put on and taking off regular lower body clothing?: Total 6 Click Score: 6    End of Session Equipment Utilized During Treatment: Gait belt  OT Visit Diagnosis: Unsteadiness on feet (R26.81);Other abnormalities of gait and mobility (R26.89);Muscle weakness (generalized) (M62.81);Hemiplegia and hemiparesis;Low vision, both eyes (H54.2) Hemiplegia - Right/Left: Left Hemiplegia - dominant/non-dominant: Non-Dominant Hemiplegia - caused by: Cerebral infarction   Activity Tolerance Patient tolerated treatment well   Patient Left in chair;with call bell/phone within reach;with chair alarm set   Nurse Communication Mobility status;Precautions  Time: 8337-4451 OT Time Calculation (min): 23 min  Charges: OT General Charges $OT Visit: 1 Visit OT Treatments $Self Care/Home Management : 8-22 mins   Brynn, OTR/L  Acute Rehabilitation Services Pager: [phone] Office: [phone] .   Jeri Modena 07/01/2021, 2:05 PM

## 2021-07-01 NOTE — Progress Notes (Signed)
Physical Therapy Treatment Patient Details Name: Dominique [name] MRN: [id number] DOB: November 11, 1932 Today's Date: 07/01/2021   History of Present Illness Dominique [name] is a 87 y.o. female found down at facility and admitted on 06/24/21 with CVA.  MRI revealed R MCA infarcts within R cerebral hemisphere and R basal ganglia, Superimposed  petechial hemorrhage within the right basal ganglia. Mild mass effect related to the right basal ganglia infarct with subtle partial effacement of the right lateral  ventricle. Punctate acute cortical infarct within the medial posterior left frontal lobe. PHMx:Artial fibrillation not on anticoagulation due to frequent falls, baseline dementia, hypertension, hypothyroidism, uterine cancer, prior thrombocytopenia resolved more recently. significant right upper extremity pain,multiple pelvic fractures and right humeral fracture in December 2020 which required significant rehabilitation.    PT Comments    Pt lethargic and easily fatigued. She had diarrhea at arrival and was rolled for cleaning (nursing into assist), then fatigued for EOB balance and transfer to chair with max x 2 A.  Positioned in chair with heels floating and extremities supported.     Recommendations for follow up therapy are one component of a multi-disciplinary discharge planning process, led by the attending physician.  Recommendations may be updated based on patient status, additional functional criteria and insurance authorization.  Follow Up Recommendations  SNF     Equipment Recommendations  None recommended by PT    Recommendations for Other Services       Precautions / Restrictions Precautions Precautions: Fall Precaution Comments: cortrak, hx of dementia     Mobility  Bed Mobility Overal bed mobility: Needs Assistance Bed Mobility: Rolling;Sidelying to Sit Rolling: Max assist;+2 for safety/equipment Sidelying to sit: Max assist;+2 for safety/equipment       General bed  mobility comments: Rolling to both sides for cleaning (pt with loose bm); then max A to lift trunk to sit EOB    Transfers Overall transfer level: Needs assistance Equipment used: 2 person hand held assist Transfers: Sit to/from [facility] Sit to Stand: +2 physical assistance;Max assist Stand pivot transfers: Max assist;+2 physical assistance       General transfer comment: Stand pivot to R side with max x 2 using gait belt and bed pad to facilitate; pt lethargic/.fatigued after rolling to clean  Ambulation/Gait             General Gait Details: unable   Stairs             Wheelchair Mobility    Modified Rankin (Stroke Patients Only) Modified Rankin (Stroke Patients Only) Pre-Morbid Rankin Score: Moderately severe disability Modified Rankin: Severe disability     Balance Overall balance assessment: Needs assistance Sitting-balance support: No upper extremity supported;Feet supported Sitting balance-Leahy Scale: Poor Sitting balance - Comments: Requring mod A at EOB, tending to lean whichever direction she was positioned, poor trunk control   Standing balance support: Bilateral upper extremity supported;During functional activity Standing balance-Leahy Scale: Zero Standing balance comment: maxA+2 to maintain standing                            Cognition Arousal/Alertness: Lethargic Behavior During Therapy: Flat affect Overall Cognitive Status: No family/caregiver present to determine baseline cognitive functioning                                 General Comments: Hx of dementia; followed limited commands today with multimodal  cues (lethargic)      Exercises General Exercises - Lower Extremity Long Arc Quad: AROM;Both;5 reps;Seated (minimal motion on L)    General Comments General comments (skin integrity, edema, etc.): On RA with VSS; cortrak paused for rolling and trnasfers and restarted end of session       Pertinent Vitals/Pain Pain Assessment: Faces Faces Pain Scale: Hurts little more Pain Location: generalized - during transfers Pain Descriptors / Indicators: Discomfort Pain Intervention(s): Limited activity within patient's tolerance;Monitored during session;Repositioned    Home Living                      Prior Function            PT Goals (current goals can now be found in the care plan section) Progress towards PT goals: Progressing toward goals    Frequency    Min 3X/week      PT Plan Current plan remains appropriate    Co-evaluation PT/OT/SLP Co-Evaluation/Treatment: Yes Reason for Co-Treatment: Complexity of the patient's impairments (multi-system involvement);For patient/therapist safety PT goals addressed during session: Mobility/safety with mobility;Balance;Strengthening/ROM        AM-PAC PT "6 Clicks" Mobility   Outcome Measure  Help needed turning from your back to your side while in a flat bed without using bedrails?: Total Help needed moving from lying on your back to sitting on the side of a flat bed without using bedrails?: Total Help needed moving to and from a bed to a chair (including a wheelchair)?: Total Help needed standing up from a chair using your arms (e.g., wheelchair or bedside chair)?: Total Help needed to walk in hospital room?: Total Help needed climbing 3-5 steps with a railing? : Total 6 Click Score: 6    End of Session Equipment Utilized During Treatment: Gait belt Activity Tolerance: Patient tolerated treatment well Patient left: in chair;with call bell/phone within reach;with chair alarm set (maxisky pad in place) Nurse Communication: Need for lift equipment;Mobility status PT Visit Diagnosis: Unsteadiness on feet (R26.81);Muscle weakness (generalized) (M62.81);Repeated falls (R29.6);Difficulty in walking, not elsewhere classified (R26.2)     Time: 1111-1140 PT Time Calculation (min) (ACUTE ONLY): 29  min  Charges:  $Therapeutic Activity: 8-22 mins                     Abran Richard, PT Acute Rehab Services Pager [phone] [facility] [phone]    Karlton Lemon 07/01/2021, 12:05 PM

## 2021-07-01 NOTE — Progress Notes (Addendum)
STROKE TEAM PROGRESS NOTE   INTERVAL HISTORY No family at the bedside. Pt lying in bed, no acute event overnight. HR low 60s but in NSR. Remains on digoxin.  Severe dysarthria. Palliative care on board, pt DNR and DNI at this time. On TF. Able to follow some commands.     Vitals:   07/01/21 1000 07/01/21 1100 07/01/21 1200 07/01/21 1300  BP: (!) 145/68 135/65 (!) 149/66 (!) 162/67  Pulse: 63 (!) 59 (!) 58 65  Resp: 19 (!) 22 15 18   Temp:  (!) 97.5 F (36.4 C)    TempSrc:  Axillary    SpO2: 92% 90% 94% 94%  Weight:      Height:       CBC:  Recent Labs  Lab 06/24/21 1914 06/24/21 1919 06/30/21 0309 07/01/21 0129  WBC 14.8*   < > 15.4* 14.5*  NEUTROABS 12.8*  --   --   --   HGB 11.5*   < > 12.5 10.3*  HCT 35.6*   < > 36.7 31.2*  MCV 104.4*   < > 101.4* 101.0*  PLT 344   < > 347 352   < > = values in this interval not displayed.   Basic Metabolic Panel:  Recent Labs  Lab 06/28/21 0602 06/28/21 1629 06/29/21 0301 06/30/21 0550 07/01/21 0129  NA 137  --    < > 136 133*  K 3.4*  --    < > 4.9 4.6  CL 104  --    < > 103 100  CO2 23  --    < > 26 25  GLUCOSE 131*  --    < > 122* 128*  BUN 27*  --    < > 23 24*  CREATININE 0.63  --    < > 0.56 0.47  CALCIUM 8.3*  --    < > 8.7* 8.5*  MG 2.0 1.9  --   --   --   PHOS 2.5 1.9*  --   --   --    < > = values in this interval not displayed.    Lipid Panel:  Recent Labs  Lab 06/25/21 0538  CHOL 75  TRIG 46  50  HDL 49  CHOLHDL 1.5  VLDL 9  LDLCALC 17    HgbA1c:  Recent Labs  Lab 06/25/21 0538  HGBA1C 5.3    Alcohol Level  Recent Labs  Lab 06/24/21 1914  ETH <10    IMAGING past 24 hours No results found.   PHYSICAL EXAM  Temp:  [97.5 F (36.4 C)-99.1 F (37.3 C)] 97.5 F (36.4 C) (09/20 1100) Pulse Rate:  [54-67] 65 (09/20 1300) Resp:  [14-31] 18 (09/20 1300) BP: (117-162)/(53-76) 162/67 (09/20 1300) SpO2:  [90 %-97 %] 94 % (09/20 1300) Weight:  [50.6 kg] 50.6 kg (09/20 0500)  General -  cachectic, well developed, lethargic but eyes open. Right elbow edema with small blisters. Left elbow posterior deep wound on dressing.   Ophthalmologic - fundi not visualized due to noncooperation.  Cardiovascular - regular heart rate and rhythm today, not in afib  Neuro - lethargic but eyes open on voice, severe dysarthria with intangible words. Able to follow limited central commands and peripheral commands on the right hand, severe dysarthria.  No gaze palsy, tracking bilaterally, however, not blinking to visual threat on the left, but blinking on the right. Left facial droop. . Right UE 3/5 bicep and in flexed position. LUE flaccid. RLE 2/5 proximal and  distal toe DF/PF 3/5. LLE proximal 1/5 and distal toe DF/PF 2/5. Sensation, coordination not cooperative and gait not tested.     ASSESSMENT/PLAN Dominique [name] is a 87 y.o. female with history of atrial fibrillation not on anticoagulation due to frequent falls, baseline dementia, hypertension, hypothyroidism, uterine cancer, prior thrombocytopenia resolved more recently admitted for left-sided weakness and aphasia.  Stroke:  right MCA scattered moderate infarcts with hemorrhagic conversion at right BG due to right MCA occlusion s/p IR with TICI3, embolic secondary to AF not on AC due to fall risk  CT head No acute abnormality.  ASPECTS 10.     CTA head & neck  1. Occlusion of the proximal right M1 MCA. 2. Large area of right MCA territory penumbra (79 mL) without evidence of core infarct. 3. 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 4. Severe left (difficult to quantify but likely greater than 80%) and approximately 70% right proximal ICA stenosis in the neck due to atherosclerosis. MRI   1. Acute right MCA territory infarcts within the right cerebral hemisphere and right basal ganglia, as described. Superimposed petechial hemorrhage within the right basal ganglia (Hiedelberg  classifications, HI2). 2. Punctate acute cortical infarct within the medial posterior left frontal lobe.  MRA The M1 right middle cerebral artery remains patent status post mechanical thrombectomy performed 06/24/2021. 2. Atherosclerotic irregularity of the M2 and more distal right MCA vessels. Most notably, a moderate stenosis is present within a superior division proximal M2 right MCA vessel. CT repeat 9/17 Unchanged right MCA distribution infarcts with petechial hemorrhage at the basal ganglia. 2D Echo EF 60 to 65%.  No wall motion abnormalities. LDL 17 HgbA1c 5.3 VTE prophylaxis - heparin subq No antithrombotic prior to admission, now on ASA 325. Will consider DOAC once right BG hemorrhage resolves if still aggressive care.  Therapy recommendations:  SNF Disposition:   pending, palliative care on board, currently DNR/DNI  Atrial Fibrillation with RVR Not on [facility] prior to admission due to falls  CT repeat 9/17 Unchanged right MCA distribution infarcts with petechial hemorrhage at right BG. On amiodarone drip -> off 9/18 On metoprolol 25 bid On digoxin Continue cardiac monitoring -> NSR today with HR 60s now on ASA 325. Will consider DOAC once right BG hemorrhage resolves if aggressive care.   Respiratory failure Intubated for procedure Extubated -> re-intubated Now re-extubated -> still has difficulty with secretions CCM signed off DNR/DNI Close monitoring  Dsyphagia Speech on board On TF @ 13 Likely need PEG before back to SNF  Hypertension Home meds:  Lopressor Stable  Long-term BP goal normotensive  Hyperlipidemia Home meds:  zocor 20mg ,  resumed in hospital LDL 17, goal < 70 Off statin due to extremely low LDL  Other Stroke Risk Factors  Advanced Age >/= 47   Hx stroke/TIA  Uterine cancer  Other Active Problems Right elbow blister Left elbow wound - wound care on board  Hospital day # 7  ATTENDING NOTE: I reviewed above note and agree with the assessment  and plan. Pt was seen and examined.   No family at bedside.  Patient still very lethargic, no neurological changes from yesterday, still has left hemiplegia, able to follow simple commands on the right hand.  Barely open eyes, severe dysarthria with intangible words.  Still has bilateral eye yellowish secretions, will put on Cipro eyedrops.  Still has difficulty with oral secretions but afebrile and leukocytosis improving.  Palliative care  on board and continue communicating with families.  For detailed assessment and plan, please refer to above as I have made changes wherever appropriate.   [name], MD PhD Stroke Neurology 07/01/2021 7:57 PM  This patient is critically ill due to large right MCA stroke status post thrombectomy, right BG hemorrhage conversion, A. fib RVR, respiratory distress dysphagia and at significant risk of neurological worsening, death form recurrent stroke, hemorrhagic conversion, cerebral edema, heart failure, respiratory failure and sepsis. This patient's care requires constant monitoring of vital signs, hemodynamics, respiratory and cardiac monitoring, review of multiple databases, neurological assessment, discussion with family, other specialists and medical decision making of high complexity. I spent 40 minutes of neurocritical care time in the care of this patient.     To contact Stroke Continuity provider, please refer to [email or web address] After hours, contact General Neurology

## 2021-07-01 NOTE — TOC Initial Note (Addendum)
Transition of Care [facility]) - Initial/Assessment Note    Patient Details  Name: DEMAYA [name] MRN: [id number] Date of Birth: 1933/01/19  Transition of Care [facility] CM/SW Contact:    Ella Bodo, RN Phone Number: 07/01/2021, 5:33 PM  Clinical Narrative:                 Dominique [name] is a 87 y.o. female found down at facility and admitted on 06/24/21 with CVA.  MRI revealed R MCA infarcts within R cerebral hemisphere and R basal ganglia, Superimposed  petechial hemorrhage within the right basal ganglia.  Prior to admission, patient was a long-term resident at [place] facility.  Noted patient's continued n.p.o. status and need for cortrak feeding tube.  MD: Please consider possible PEG tube placement, as speech therapy has signed off due to no progress towards goals.  TOC will continue to follow.  Expected Discharge Plan: [place] Barriers to Discharge: Continued Medical Work up   Patient Goals and CMS Choice        Expected Discharge Plan and Services Expected Discharge Plan: [place]   Discharge Planning Services: CM Consult   Living arrangements for the past 2 months: [place]                                      Prior Living Arrangements/Services Living arrangements for the past 2 months: [place] Lives with:: Facility Resident                   Activities of Daily Living      Permission Sought/Granted                  Emotional Assessment              Admission diagnosis:  Fall [W19.XXXA] Acute ischemic right MCA stroke ([place]) [I63.511] Fall, initial encounter [W19.XXXA] Altered mental status, unspecified altered mental status type [R41.82] Ischemic stroke [facility]) [I63.9] Patient Active Problem List   Diagnosis Date Noted   Pressure ulcer 06/25/2021   Altered mental status    Acute ischemic right MCA stroke ([place]) 06/24/2021   Endotracheal tube present     Acute respiratory failure with hypoxia (HCC)    Pain due to onychomycosis of toenails of both feet 03/04/2021   UTI (urinary tract infection) 09/26/2019   Multiple pelvic fractures ([place]) 09/25/2019   Closed right humeral fracture 09/25/2019   Acute lower UTI    Dementia (HCC)    E. 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  Reinaldo Raddle, RN,  BSN  Trauma/Neuro ICU Case Manager [phone]

## 2021-07-02 DIAGNOSIS — Z515 Encounter for palliative care: Secondary | ICD-10-CM | POA: Diagnosis not present

## 2021-07-02 DIAGNOSIS — I639 Cerebral infarction, unspecified: Secondary | ICD-10-CM | POA: Diagnosis not present

## 2021-07-02 DIAGNOSIS — I63511 Cerebral infarction due to unspecified occlusion or stenosis of right middle cerebral artery: Secondary | ICD-10-CM | POA: Diagnosis not present

## 2021-07-02 DIAGNOSIS — J9601 Acute respiratory failure with hypoxia: Secondary | ICD-10-CM | POA: Diagnosis not present

## 2021-07-02 DIAGNOSIS — Z4659 Encounter for fitting and adjustment of other gastrointestinal appliance and device: Secondary | ICD-10-CM

## 2021-07-02 LAB — CBC
HCT: 34.5 % — ABNORMAL LOW (ref 36.0–46.0)
Hemoglobin: 11.9 g/dL — ABNORMAL LOW (ref 12.0–15.0)
MCH: 34 pg (ref 26.0–34.0)
MCHC: 34.5 g/dL (ref 30.0–36.0)
MCV: 98.6 fL (ref 80.0–100.0)
Platelets: 476 10*3/uL — ABNORMAL HIGH (ref 150–400)
RBC: 3.5 MIL/uL — ABNORMAL LOW (ref 3.87–5.11)
RDW: 14.6 % (ref 11.5–15.5)
WBC: 25.4 10*3/uL — ABNORMAL HIGH (ref 4.0–10.5)
nRBC: 0 % (ref 0.0–0.2)

## 2021-07-02 LAB — BASIC METABOLIC PANEL
Anion gap: 9 (ref 5–15)
BUN: 24 mg/dL — ABNORMAL HIGH (ref 8–23)
CO2: 27 mmol/L (ref 22–32)
Calcium: 8.9 mg/dL (ref 8.9–10.3)
Chloride: 95 mmol/L — ABNORMAL LOW (ref 98–111)
Creatinine, Ser: 0.56 mg/dL (ref 0.44–1.00)
GFR, Estimated: >60 mL/min (ref >60)
Glucose, Bld: 207 mg/dL — ABNORMAL HIGH (ref 70–99)
Potassium: 4.8 mmol/L (ref 3.5–5.1)
Sodium: 131 mmol/L — ABNORMAL LOW (ref 135–145)

## 2021-07-02 LAB — GLUCOSE, CAPILLARY
Glucose-Capillary: 113 mg/dL — ABNORMAL HIGH (ref 70–99)
Glucose-Capillary: 116 mg/dL — ABNORMAL HIGH (ref 70–99)
Glucose-Capillary: 139 mg/dL — ABNORMAL HIGH (ref 70–99)
Glucose-Capillary: 143 mg/dL — ABNORMAL HIGH (ref 70–99)
Glucose-Capillary: 191 mg/dL — ABNORMAL HIGH (ref 70–99)
Glucose-Capillary: 237 mg/dL — ABNORMAL HIGH (ref 70–99)

## 2021-07-02 NOTE — Progress Notes (Signed)
Palliative Medicine Inpatient Follow Up Note  Consulting Provider: [name], MD   Reason for consult:   [place] Palliative Medicine Consult  Reason for Consult? GOC discussion    HPI:  Per intake H&P --> Dominique [name] is a 87 y.o. female with a past medical history significant for atrial fibrillation not on anticoagulation due to frequent falls, baseline dementia, hypertension, hypothyroidism, uterine cancer, prior thrombocytopenia resolved more recently.   Palliative care was asked to get involved in the setting of a right MCA infarction to discussion goals of care.  Today's Discussion (07/02/2021):  *Please note that this is a verbal dictation therefore any spelling or grammatical errors are due to the "[place] [employer]" system interpretation.  Chart reviewed.   From review of speech therapy notes patient is still recommended to be NPO, not progressing.  Yaniris's RN, Cheri Rous shares that there had been no significant changes from what he understood.  I met with [name] at bedside she is able to open her eyes and respond to her name. She nods yes to pain but cannot identify a location. Her nonverbal indicators and VS do not appear to indicate pain.   I shared with Dr. Erlinda Hong via secure chat that Jeena does not seem to be progressing and I am concerned about her nutritional state in the long run. He shared the same worries and stated that her son needs to be spoken to about a G-Tube.   Per conversation with patients son [name] he had not wanted Skyylar to have a G-tube and was hopeful she could progress otherwise on her own. I called him this afternoon and shared that at this point we are at a fork in the road where additional decisions will need to be made. [name] understood this though is caught up in work meetings today. He is in agreement with setting up a time to meet with the Palliative care team for tomorrow. He shares he cannot commit to a time right now but  will have clarity this evening on a meeting time.  Questions and concerns addressed   Objective Assessment: Vital Signs Vitals:   07/02/21 1000 07/02/21 1100  BP: (!) 120/46 (!) 106/44  Pulse: 62 65  Resp: (!) 32 (!) 30  Temp:    SpO2: 97% 95%    Intake/Output Summary (Last 24 hours) at 07/02/2021 1235 Last data filed at 07/02/2021 0800 Gross per 24 hour  Intake 1100 ml  Output 800 ml  Net 300 ml    Last Weight  Most recent update: 07/02/2021  5:47 AM    Weight  50.6 kg (111 lb 8.8 oz)            Gen:  Elderly F unwell appearing HEENT: Dry mucous membranes, (+) Coretrack CV: Irregular rate and rhythm  PULM: On RA, (+) notable sounds (+) gurgling  ABD: soft/nontender  EXT: No edema Neuro: A+O x1  SUMMARY OF RECOMMENDATIONS   DNAR/DNI   Will place a GOLD DNR form on the chart   Plan for meet with PMT tomorrow, patients son [name] will be able to commit to a time late this evening. We will call him in the morning for clarity.  I will not be present tomorrow though I will request that my colleague, Stephani Police follow up   Time Spent: 25 Greater than 50% of the time was spent in counseling and coordination of care ______________________________________________________________________________________ [place] Team Team Cell Phone: [phone] Please  utilize secure chat with additional questions, if there is no response within 30 minutes please call the above phone number  Palliative Medicine Team providers are available by phone from 7am to 7pm daily and can be reached through the team cell phone.  Should this patient require assistance outside of these hours, please call the patient's attending physician.

## 2021-07-02 NOTE — Progress Notes (Signed)
STROKE TEAM PROGRESS NOTE   INTERVAL HISTORY No family at the bedside. Pt lying in bed, no acute event overnight, neuro unchanged.  Eyes open to voice, severe dysarthria. Palliative care on board.  Palliative NP and I both contacted patient's son regarding further [place] discussion and possible PEG tube if DC to SNF, son would like to have face-to-face Sital meeting Friday.  Vitals:   07/02/21 1700 07/02/21 1800 07/02/21 1900 07/02/21 2000  BP: (!) 128/50 (!) 128/50 (!) 138/48   Pulse: 73 73 68   Resp: (!) 24 19 19    Temp:    99.7 F (37.6 C)  TempSrc:    Axillary  SpO2: 95% 92% 95%   Weight:      Height:       CBC:  Recent Labs  Lab 07/01/21 0129 07/02/21 0539  WBC 14.5* 25.4*  HGB 10.3* 11.9*  HCT 31.2* 34.5*  MCV 101.0* 98.6  PLT 352 379*   Basic Metabolic Panel:  Recent Labs  Lab 06/28/21 0602 06/28/21 1629 06/29/21 0301 07/01/21 0129 07/02/21 0539  NA 137  --    < > 133* 131*  K 3.4*  --    < > 4.6 4.8  CL 104  --    < > 100 95*  CO2 23  --    < > 25 27  GLUCOSE 131*  --    < > 128* 207*  BUN 27*  --    < > 24* 24*  CREATININE 0.63  --    < > 0.47 0.56  CALCIUM 8.3*  --    < > 8.5* 8.9  MG 2.0 1.9  --   --   --   PHOS 2.5 1.9*  --   --   --    < > = values in this interval not displayed.    Lipid Panel:  No results for input(s): CHOL, TRIG, HDL, CHOLHDL, VLDL, LDLCALC in the last 168 hours.   HgbA1c:  No results for input(s): HGBA1C in the last 168 hours.   Alcohol Level  No results for input(s): ETH in the last 168 hours.   IMAGING past 24 hours No results found.   PHYSICAL EXAM  Temp:  [98.4 F (36.9 C)-99.8 F (37.7 C)] 99.7 F (37.6 C) (09/21 2000) Pulse Rate:  [62-92] 68 (09/21 1900) Resp:  [14-32] 19 (09/21 1900) BP: (103-190)/(42-114) 138/48 (09/21 1900) SpO2:  [92 %-100 %] 95 % (09/21 1900) Weight:  [50.6 kg] 50.6 kg (09/21 0500)  General - cachectic, well developed, lethargic but eyes open. Right elbow edema with small blisters.  Left elbow posterior deep wound on dressing.   Ophthalmologic - fundi not visualized due to noncooperation.  Cardiovascular - regular heart rate and rhythm today, not in afib  Neuro - very lethargic, eyes open on voice, severe dysarthria with intangible words. Able to follow limited central commands and peripheral commands on the right hand, severe dysarthria. No gaze palsy, tracking bilaterally and slowly, however, not blinking to visual threat on the left, but blinking on the right. Left facial droop. . Right UE 3/5 bicep and in flexed position. LUE flaccid. RLE 2/5 proximal and distal toe DF/PF 3/5. LLE proximal 1/5 and distal toe DF/PF 2/5. Sensation, coordination not cooperative and gait not tested.     ASSESSMENT/PLAN Dominique [name] is a 87 y.o. female with history of atrial fibrillation not on anticoagulation due to frequent falls, baseline dementia, hypertension, hypothyroidism, uterine cancer, prior thrombocytopenia resolved more  recently admitted for left-sided weakness and aphasia.  Stroke:  right MCA scattered moderate infarcts with hemorrhagic conversion at right BG due to right MCA occlusion s/p IR with TICI3, embolic secondary to AF not on AC due to fall risk  CT head No acute abnormality.  ASPECTS 10.     CTA head & neck  1. Occlusion of the proximal right M1 MCA. 2. Large area of right MCA territory penumbra (79 mL) without evidence of core infarct. 3. 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 4. Severe left (difficult to quantify but likely greater than 80%) and approximately 70% right proximal ICA stenosis in the neck due to atherosclerosis. MRI   1. Acute right MCA territory infarcts within the right cerebral hemisphere and right basal ganglia, as described. Superimposed petechial hemorrhage within the right basal ganglia (Hiedelberg classifications, HI2). 2. Punctate acute cortical infarct within the medial  posterior left frontal lobe.  MRA The M1 right middle cerebral artery remains patent status post mechanical thrombectomy performed 06/24/2021. 2. Atherosclerotic irregularity of the M2 and more distal right MCA vessels. Most notably, a moderate stenosis is present within a superior division proximal M2 right MCA vessel. CT repeat 9/17 Unchanged right MCA distribution infarcts with petechial hemorrhage at the basal ganglia. 2D Echo EF 60 to 65%.  No wall motion abnormalities. LDL 17 HgbA1c 5.3 VTE prophylaxis - heparin subq No antithrombotic prior to admission, now on ASA 325. Will consider DOAC once right BG hemorrhage resolves if still aggressive care.  Therapy recommendations:  SNF Disposition:   pending, palliative care on board, currently DNR/DNI  Atrial Fibrillation with RVR Not on [facility] prior to admission due to falls  CT repeat 9/17 Unchanged right MCA distribution infarcts with petechial hemorrhage at right BG. On amiodarone drip -> off 9/18 On metoprolol 25 bid On digoxin Continue cardiac monitoring -> NSR today with HR 60s now on ASA 325. Will consider DOAC once right BG hemorrhage resolves if aggressive care.   Respiratory failure Intubated for procedure Extubated -> re-intubated Now re-extubated -> still has difficulty with secretions CCM signed off DNR/DNI Close monitoring  Dsyphagia Speech on board On TF @ 51 Likely need PEG before back to SNF  Hypertension Home meds:  Lopressor Stable  Long-term BP goal normotensive  Hyperlipidemia Home meds:  zocor 20mg ,  resumed in hospital LDL 17, goal < 70 Off statin due to extremely low LDL  Other Stroke Risk Factors  Advanced Age >/= 52   Hx stroke/TIA  Uterine cancer  Other Active Problems Right elbow blister Left elbow wound - wound care on board  Hospital day # 8  This patient is critically ill due to large right MCA stroke status post thrombectomy, right BG hemorrhage conversion, A. fib RVR, respiratory  distress dysphagia and at significant risk of neurological worsening, death form recurrent stroke, hemorrhagic conversion, cerebral edema, heart failure, respiratory failure and sepsis. This patient's care requires constant monitoring of vital signs, hemodynamics, respiratory and cardiac monitoring, review of multiple databases, neurological assessment, discussion with family, other specialists and medical decision making of high complexity. I spent 40 minutes of neurocritical care time in the care of this patient.  [name], MD PhD Stroke Neurology 07/02/2021 8:20 PM       To contact Stroke Continuity provider, please refer to [email or web address] After hours, contact General Neurology

## 2021-07-02 NOTE — Progress Notes (Signed)
Physical Therapy Treatment Patient Details Name: Dominique [name] MRN: [id number] DOB: [birth year] Today's Date: 07/02/2021   History of Present Illness Dominique [name] is a 87 y.o. female found down at facility and admitted on 06/24/21 with CVA.  MRI revealed R MCA infarcts within R cerebral hemisphere and R basal ganglia, Superimposed  petechial hemorrhage within the right basal ganglia. Mild mass effect related to the right basal ganglia infarct with subtle partial effacement of the right lateral  ventricle. Punctate acute cortical infarct within the medial posterior left frontal lobe. PHMx:Artial fibrillation not on anticoagulation due to frequent falls, baseline dementia, hypertension, hypothyroidism, uterine cancer, prior thrombocytopenia resolved more recently. significant right upper extremity pain,multiple pelvic fractures and right humeral fracture in December 2020 which required significant rehabilitation.    PT Comments    Patient with slow progress, but participative some with nodding head she was alright and performing LAQ in sitting.  She did not tolerate much ROM in cervical spine nor with R UE.  Noting significant edema and eccymosis L UE so placed on pillow.  PT to continue to follow acutely.     Recommendations for follow up therapy are one component of a multi-disciplinary discharge planning process, led by the attending physician.  Recommendations may be updated based on patient status, additional functional criteria and insurance authorization.  Follow Up Recommendations  SNF     Equipment Recommendations  None recommended by PT    Recommendations for Other Services       Precautions / Restrictions Precautions Precautions: Fall Precaution Comments: cortrak, hx of dementia, R gaze pref     Mobility  Bed Mobility Overal bed mobility: Needs Assistance Bed Mobility: Supine to Sit     Supine to sit: HOB elevated;Max assist     General bed mobility comments: assist to  bring legs off bed and lift trunk    Transfers Overall transfer level: Needs assistance   Transfers: Squat Pivot Transfers Sit to Stand: Max assist         General transfer comment: assisted to drop arm recliner using bed pad and +1 A.  She supported weight some on R during transfer  Ambulation/Gait                 Stairs             Wheelchair Mobility    Modified Rankin (Stroke Patients Only) Modified Rankin (Stroke Patients Only) Pre-Morbid Rankin Score: Moderately severe disability Modified Rankin: Severe disability     Balance Overall balance assessment: Needs assistance Sitting-balance support: Feet supported Sitting balance-Leahy Scale: Poor Sitting balance - Comments: seated EOB about 5 minutes working on midline gaze, attempted cervical ROM to L. but pt grimacing and resisting; min A mostly for balance at EOB                                    Cognition Arousal/Alertness: Lethargic Behavior During Therapy: Flat affect Overall Cognitive Status: No family/caregiver present to determine baseline cognitive functioning                                 General Comments: Hx of dementia; followed limited commands today with multimodal cues (lethargic)      Exercises General Exercises - Upper Extremity Shoulder Flexion: AAROM;Both;5 reps;Supine Shoulder Horizontal ABduction: AAROM;Left;5 reps;Supine Shoulder Horizontal ADduction: AAROM;Left;5 reps;Supine General  Exercises - Lower Extremity Long Arc Quad: AROM;Both;Seated;AAROM;10 reps    General Comments General comments (skin integrity, edema, etc.): on RA throughout with VSS, in chair restarted tube feeds and placed lift pad in chair for nursing to transfer back to bed      Pertinent Vitals/Pain Pain Assessment: Faces Faces Pain Scale: Hurts little more Pain Location: R UE with AAROM Pain Descriptors / Indicators: Grimacing Pain Intervention(s): Monitored during  session;Limited activity within patient's tolerance    Home Living                      Prior Function            PT Goals (current goals can now be found in the care plan section) Progress towards PT goals: Progressing toward goals    Frequency    Min 3X/week      PT Plan Current plan remains appropriate    Co-evaluation              AM-PAC PT "6 Clicks" Mobility   Outcome Measure  Help needed turning from your back to your side while in a flat bed without using bedrails?: Total Help needed moving from lying on your back to sitting on the side of a flat bed without using bedrails?: Total Help needed moving to and from a bed to a chair (including a wheelchair)?: Total Help needed standing up from a chair using your arms (e.g., wheelchair or bedside chair)?: Total Help needed to walk in hospital room?: Total Help needed climbing 3-5 steps with a railing? : Total 6 Click Score: 6    End of Session   Activity Tolerance: Patient limited by fatigue Patient left: in chair;with call bell/phone within reach;with chair alarm set Nurse Communication: Need for lift equipment;Mobility status PT Visit Diagnosis: Unsteadiness on feet (R26.81);Muscle weakness (generalized) (M62.81);Repeated falls (R29.6);Difficulty in walking, not elsewhere classified (R26.2)     Time: 9678-9381 PT Time Calculation (min) (ACUTE ONLY): 24 min  Charges:  $Therapeutic Activity: 23-37 mins                     Magda Kiel, PT Acute Rehabilitation Services OFBPZ:[phone] Office:[phone] 07/02/2021    Reginia Naas 07/02/2021, 5:08 PM

## 2021-07-03 DIAGNOSIS — R1312 Dysphagia, oropharyngeal phase: Secondary | ICD-10-CM | POA: Diagnosis not present

## 2021-07-03 DIAGNOSIS — E43 Unspecified severe protein-calorie malnutrition: Secondary | ICD-10-CM | POA: Insufficient documentation

## 2021-07-03 DIAGNOSIS — I639 Cerebral infarction, unspecified: Secondary | ICD-10-CM | POA: Diagnosis not present

## 2021-07-03 DIAGNOSIS — I63511 Cerebral infarction due to unspecified occlusion or stenosis of right middle cerebral artery: Secondary | ICD-10-CM | POA: Diagnosis not present

## 2021-07-03 DIAGNOSIS — I4891 Unspecified atrial fibrillation: Secondary | ICD-10-CM | POA: Diagnosis not present

## 2021-07-03 LAB — GLUCOSE, CAPILLARY
Glucose-Capillary: 120 mg/dL — ABNORMAL HIGH (ref 70–99)
Glucose-Capillary: 152 mg/dL — ABNORMAL HIGH (ref 70–99)
Glucose-Capillary: 153 mg/dL — ABNORMAL HIGH (ref 70–99)
Glucose-Capillary: 154 mg/dL — ABNORMAL HIGH (ref 70–99)
Glucose-Capillary: 161 mg/dL — ABNORMAL HIGH (ref 70–99)

## 2021-07-03 MED ORDER — FREE WATER
135.0000 mL | Freq: Four times a day (QID) | Status: DC
  Administered 2021-07-03 – 2021-07-10 (×27): 135 mL

## 2021-07-03 NOTE — Progress Notes (Signed)
Occupational Therapy Treatment Patient Details Name: Dominique [name] MRN: [id number] DOB: 1933-08-15 Today's Date: 07/03/2021   History of present illness Dominique [name] is a 87 y.o. female found down at facility and admitted on 06/24/21 with CVA.  MRI revealed R MCA infarcts within R cerebral hemisphere and R basal ganglia, Superimposed  petechial hemorrhage within the right basal ganglia. Mild mass effect related to the right basal ganglia infarct with subtle partial effacement of the right lateral  ventricle. Punctate acute cortical infarct within the medial posterior left frontal lobe. PHMx:Artial fibrillation not on anticoagulation due to frequent falls, baseline dementia, hypertension, hypothyroidism, uterine cancer, prior thrombocytopenia resolved more recently. significant right upper extremity pain,multiple pelvic fractures and right humeral fracture in December 2020 which required significant rehabilitation.   OT comments  Pt presenting with decreased arousal, sitting balance, functional use of LUE, and strength. Upon arrival, pt HOB at [place] while tube feed running; breathing sounding wet. Notified RN. Pt requiring Total A for bed mobility. Focused session on sitting balance at EOB, righting reactions, and arousal level. Using music to increase engagement and arousal. Noting LUE with increased edema and warm to touch. Notified RN. Continue to recommend dc to SNF and will continue to follow acutely as admitted.    Recommendations for follow up therapy are one component of a multi-disciplinary discharge planning process, led by the attending physician.  Recommendations may be updated based on patient status, additional functional criteria and insurance authorization.    Follow Up Recommendations  SNF;Supervision/Assistance - 24 hour    Equipment Recommendations  Wheelchair (measurements OT);Wheelchair cushion (measurements OT);Hospital bed;Other (comment)    Recommendations for Other  Services Rehab consult    Precautions / Restrictions Precautions Precautions: Fall Precaution Comments: cortrak, hx of dementia, R gaze pref       Mobility Bed Mobility Overal bed mobility: Needs Assistance Bed Mobility: Supine to Sit;Sit to Supine   Sidelying to sit: Total assist;+2 for physical assistance Supine to sit: Total assist;+2 for physical assistance     General bed mobility comments: Total A for BLEs and trunk support    Transfers Overall transfer level: Needs assistance Equipment used: 2 person hand held assist Transfers: Sit to/from Stand Sit to Stand: Max assist;+2 physical assistance         General transfer comment: MaxA for power up and maintaining standing balance    Balance Overall balance assessment: Needs assistance Sitting-balance support: No upper extremity supported;Feet supported Sitting balance-Leahy Scale: Poor Sitting balance - Comments: Min guard- Min A for sitting balance   Standing balance support: Bilateral upper extremity supported;During functional activity Standing balance-Leahy Scale: Zero                             ADL either performed or assessed with clinical judgement   ADL Overall ADL's : Needs assistance/impaired     Grooming: Maximal assistance;Wash/dry face;Sitting;Oral care;Total assistance;Bed level Grooming Details (indicate cue type and reason): Total A for oral care to increase arousal in supine. Max hand over hand for bringing RUE to face - second person for sititng balance                     Toileting- Clothing Manipulation and Hygiene: +2 for physical assistance;Total assistance;Sit to/from stand Toileting - Clothing Manipulation Details (indicate cue type and reason): +2 for sit<>stand and then +3 for peri care after bowel incontinence     Functional mobility during  ADLs: Maximal assistance;+2 for physical assistance (sit<>stand) General ADL Comments: Upon arrival, pt very lethargic.  Increased arousal at EOB. Requiring Min Guard-Min A for sitting balance. As she fatigued, requiring increased assistance for sitting balance. Max-Total A for grooming tasks     Vision       Perception     Praxis      Cognition Arousal/Alertness: Lethargic Behavior During Therapy: Flat affect Overall Cognitive Status: No family/caregiver present to determine baseline cognitive functioning                                 General Comments: Hx of dementia; followed limited commands today with multimodal cues (lethargic)        Exercises     Shoulder Instructions       General Comments Upon arrival, pt HOB at 15degrees and feeding tube running. Noting pt with gargling sounding and very lethargic and fatigued. Notifying RN that tube feed was running while pt was below 30* HOB and breathing sounding "wet".    Pertinent Vitals/ Pain       Pain Assessment: Faces Faces Pain Scale: Hurts little more Pain Location: LUE Pain Descriptors / Indicators: Grimacing Pain Intervention(s): Monitored during session;Limited activity within patient's tolerance;Repositioned  Home Living                                          Prior Functioning/Environment              Frequency  Min 2X/week        Progress Toward Goals  OT Goals(current goals can now be found in the care plan section)  Progress towards OT goals: Progressing toward goals  Acute Rehab OT Goals Patient Stated Goal: did not state OT Goal Formulation: Patient unable to participate in goal setting Time For Goal Achievement: 07/10/21 Potential to Achieve Goals: Fair ADL Goals Pt Will Perform Eating: with mod assist;with adaptive utensils;sitting Pt Will Perform Grooming: with max assist Pt Will Transfer to Toilet: stand pivot transfer;bedside commode;with mod assist Additional ADL Goal #1: Pt will be max A in and OOB for basic ADLs  Plan Discharge plan remains appropriate     Co-evaluation    PT/OT/SLP Co-Evaluation/Treatment: Yes Reason for Co-Treatment: Complexity of the patient's impairments (multi-system involvement);For patient/therapist safety;To address functional/ADL transfers   OT goals addressed during session: ADL's and self-care      AM-PAC OT "6 Clicks" Daily Activity     Outcome Measure   Help from another person eating meals?: Total Help from another person taking care of personal grooming?: Total Help from another person toileting, which includes using toliet, bedpan, or urinal?: Total Help from another person bathing (including washing, rinsing, drying)?: Total Help from another person to put on and taking off regular upper body clothing?: Total Help from another person to put on and taking off regular lower body clothing?: Total 6 Click Score: 6    End of Session    OT Visit Diagnosis: Unsteadiness on feet (R26.81);Other abnormalities of gait and mobility (R26.89);Muscle weakness (generalized) (M62.81);Hemiplegia and hemiparesis;Low vision, both eyes (H54.2) Hemiplegia - Right/Left: Left Hemiplegia - dominant/non-dominant: Non-Dominant Hemiplegia - caused by: Cerebral infarction   Activity Tolerance Patient tolerated treatment well   Patient Left in bed;with call bell/phone within reach;with bed alarm set   Nurse Communication Mobility status  Time: 1519-1600 OT Time Calculation (min): 41 min  Charges: OT General Charges $OT Visit: 1 Visit OT Treatments $Self Care/Home Management : 23-37 mins  [place], OTR/L Acute Rehab Pager: [phone] Office: [place] 07/03/2021, 5:03 PM

## 2021-07-03 NOTE — Progress Notes (Signed)
Physical Therapy Treatment Patient Details Name: Dominique [name] MRN: [id number] DOB: July 19, 1933 Today's Date: 07/03/2021   History of Present Illness Dominique [name] is a 87 y.o. female found down at facility and admitted on 06/24/21 with CVA.  MRI revealed R MCA infarcts within R cerebral hemisphere and R basal ganglia, Superimposed  petechial hemorrhage within the right basal ganglia. Mild mass effect related to the right basal ganglia infarct with subtle partial effacement of the right lateral  ventricle. Punctate acute cortical infarct within the medial posterior left frontal lobe. PHMx:Artial fibrillation not on anticoagulation due to frequent falls, baseline dementia, hypertension, hypothyroidism, uterine cancer, prior thrombocytopenia resolved more recently. significant right upper extremity pain,multiple pelvic fractures and right humeral fracture in December 2020 which required significant rehabilitation.    PT Comments    Session limited by lethargy. Upon arrival, patient HOB at 15 degrees with tube feed running, breathing sounding wet and gargling, notified RN. Patient required totalA+2 for bed mobility. Patient required up to maxA to maintain sitting balance with no righting reaction noted. Patient required maxA+2 to stand from EOB and totalA for pericare. Continue to recommend SNF for ongoing Physical Therapy.       Recommendations for follow up therapy are one component of a multi-disciplinary discharge planning process, led by the attending physician.  Recommendations may be updated based on patient status, additional functional criteria and insurance authorization.  Follow Up Recommendations  SNF     Equipment Recommendations  None recommended by PT    Recommendations for Other Services       Precautions / Restrictions Precautions Precautions: Fall Precaution Comments: cortrak, hx of dementia, R gaze pref Restrictions Weight Bearing Restrictions: No     Mobility  Bed  Mobility Overal bed mobility: Needs Assistance Bed Mobility: Supine to Sit;Sit to Supine   Sidelying to sit: Total assist;+2 for physical assistance Supine to sit: Total assist;+2 for physical assistance     General bed mobility comments: Total A for BLEs and trunk support    Transfers Overall transfer level: Needs assistance Equipment used: 2 person hand held assist Transfers: Sit to/from Stand Sit to Stand: Max assist;+2 physical assistance         General transfer comment: MaxA for power up and maintaining standing balance  Ambulation/Gait                 Stairs             Wheelchair Mobility    Modified Rankin (Stroke Patients Only)       Balance Overall balance assessment: Needs assistance Sitting-balance support: No upper extremity supported;Feet supported Sitting balance-Leahy Scale: Poor Sitting balance - Comments: Min guard- Min A for sitting balance   Standing balance support: Bilateral upper extremity supported;During functional activity Standing balance-Leahy Scale: Zero                              Cognition Arousal/Alertness: Lethargic Behavior During Therapy: Flat affect Overall Cognitive Status: No family/caregiver present to determine baseline cognitive functioning                                 General Comments: Hx of dementia; followed limited commands today with multimodal cues (lethargic)      Exercises      General Comments General comments (skin integrity, edema, etc.): Upon arrival, pt HOB at [place] and  feeding tube running. Noting pt with gargling sounding and very lethargic and fatigued. Notifying RN that tube feed was running while pt was below 30* HOB and breathing sounding "wet".      Pertinent Vitals/Pain Pain Assessment: Faces Faces Pain Scale: Hurts little more Pain Location: LUE Pain Descriptors / Indicators: Grimacing Pain Intervention(s): Monitored during session    Home  Living                      Prior Function            PT Goals (current goals can now be found in the care plan section) Acute Rehab PT Goals Patient Stated Goal: did not state PT Goal Formulation: Patient unable to participate in goal setting Time For Goal Achievement: 07/10/21 Potential to Achieve Goals: Fair Progress towards PT goals: Not progressing toward goals - comment    Frequency    Min 3X/week      PT Plan Current plan remains appropriate    Co-evaluation PT/OT/SLP Co-Evaluation/Treatment: Yes Reason for Co-Treatment: Complexity of the patient's impairments (multi-system involvement);To address functional/ADL transfers;For patient/therapist safety PT goals addressed during session: Mobility/safety with mobility;Strengthening/ROM;Balance OT goals addressed during session: ADL's and self-care      AM-PAC PT "6 Clicks" Mobility   Outcome Measure  Help needed turning from your back to your side while in a flat bed without using bedrails?: Total Help needed moving from lying on your back to sitting on the side of a flat bed without using bedrails?: Total Help needed moving to and from a bed to a chair (including a wheelchair)?: Total Help needed standing up from a chair using your arms (e.g., wheelchair or bedside chair)?: Total Help needed to walk in hospital room?: Total Help needed climbing 3-5 steps with a railing? : Total 6 Click Score: 6    End of Session Equipment Utilized During Treatment: Oxygen Activity Tolerance: Patient limited by lethargy Patient left: in bed;with call bell/phone within reach Nurse Communication: Mobility status PT Visit Diagnosis: Unsteadiness on feet (R26.81);Muscle weakness (generalized) (M62.81);Repeated falls (R29.6);Difficulty in walking, not elsewhere classified (R26.2)     Time: 8032-1224 PT Time Calculation (min) (ACUTE ONLY): 36 min  Charges:  $Therapeutic Activity: 8-22 mins                     Jahmeir Geisen A.  Gilford Rile PT, DPT Acute Rehabilitation Services Pager [phone] Office [phone]    Linna Hoff 07/03/2021, 5:15 PM

## 2021-07-03 NOTE — Progress Notes (Signed)
STROKE TEAM PROGRESS NOTE   INTERVAL HISTORY No family at the bedside. Pt lying in bed, no acute event overnight, neuro unchanged.  Eyes open to voice, severe dysarthria.  Still has difficulty handling secretions, will put on NG suction as needed.  Pending family meeting tomorrow.  Vitals:   07/03/21 1201 07/03/21 1400 07/03/21 1630 07/03/21 2005  BP: (!) 130/52 (!) 108/30 108/72 112/65  Pulse:  70 70 68  Resp:  20 20 20   Temp:  98.6 F (37 C) 98 F (36.7 C)   TempSrc:  Axillary Axillary   SpO2:  100% 100% 99%  Weight:      Height:       CBC:  Recent Labs  Lab 07/01/21 0129 07/02/21 0539  WBC 14.5* 25.4*  HGB 10.3* 11.9*  HCT 31.2* 34.5*  MCV 101.0* 98.6  PLT 352 295*   Basic Metabolic Panel:  Recent Labs  Lab 06/28/21 0602 06/28/21 1629 06/29/21 0301 07/01/21 0129 07/02/21 0539  NA 137  --    < > 133* 131*  K 3.4*  --    < > 4.6 4.8  CL 104  --    < > 100 95*  CO2 23  --    < > 25 27  GLUCOSE 131*  --    < > 128* 207*  BUN 27*  --    < > 24* 24*  CREATININE 0.63  --    < > 0.47 0.56  CALCIUM 8.3*  --    < > 8.5* 8.9  MG 2.0 1.9  --   --   --   PHOS 2.5 1.9*  --   --   --    < > = values in this interval not displayed.    Lipid Panel:  No results for input(s): CHOL, TRIG, HDL, CHOLHDL, VLDL, LDLCALC in the last 168 hours.   HgbA1c:  No results for input(s): HGBA1C in the last 168 hours.   Alcohol Level  No results for input(s): ETH in the last 168 hours.   IMAGING past 24 hours No results found.   PHYSICAL EXAM  Temp:  [97.8 F (36.6 C)-99.8 F (37.7 C)] 98 F (36.7 C) (09/22 1630) Pulse Rate:  [63-84] 68 (09/22 2005) Resp:  [15-22] 20 (09/22 2005) BP: (108-157)/(30-74) 112/65 (09/22 2005) SpO2:  [83 %-100 %] 99 % (09/22 2005) Weight:  [51.3 kg] 51.3 kg (09/22 0510)  General - cachectic, well developed, lethargic but eyes open. Right elbow edema with small blisters. Left elbow posterior deep wound on dressing.   Ophthalmologic - fundi not  visualized due to noncooperation.  Cardiovascular - regular heart rate and rhythm today, not in afib  Neuro - very lethargic, eyes open on voice, severe dysarthria with intangible words. Able to follow limited central commands and peripheral commands on the right hand, severe dysarthria. No gaze palsy, tracking bilaterally and slowly, however, not blinking to visual threat on the left, but blinking on the right. Left facial droop. . Right UE 3/5 bicep and in flexed position. LUE flaccid. RLE 2/5 proximal and distal toe DF/PF 3/5. LLE proximal 1/5 and distal toe DF/PF 2/5. Sensation, coordination not cooperative and gait not tested.     ASSESSMENT/PLAN Dominique [name] is a 87 y.o. female with history of atrial fibrillation not on anticoagulation due to frequent falls, baseline dementia, hypertension, hypothyroidism, uterine cancer, prior thrombocytopenia resolved more recently admitted for left-sided weakness and aphasia.  Stroke:  right MCA scattered moderate infarcts  with hemorrhagic conversion at right BG due to right MCA occlusion s/p IR with TICI3, embolic secondary to AF not on AC due to fall risk  CT head No acute abnormality.  ASPECTS 10.     CTA head & neck  1. Occlusion of the proximal right M1 MCA. 2. Large area of right MCA territory penumbra (79 mL) without evidence of core infarct. 3. 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 4. Severe left (difficult to quantify but likely greater than 80%) and approximately 70% right proximal ICA stenosis in the neck due to atherosclerosis. MRI   1. Acute right MCA territory infarcts within the right cerebral hemisphere and right basal ganglia, as described. Superimposed petechial hemorrhage within the right basal ganglia (Hiedelberg classifications, HI2). 2. Punctate acute cortical infarct within the medial posterior left frontal lobe.  MRA The M1 right middle cerebral artery remains  patent status post mechanical thrombectomy performed 06/24/2021. 2. Atherosclerotic irregularity of the M2 and more distal right MCA vessels. Most notably, a moderate stenosis is present within a superior division proximal M2 right MCA vessel. CT repeat 9/17 Unchanged right MCA distribution infarcts with petechial hemorrhage at the basal ganglia. 2D Echo EF 60 to 65%.  No wall motion abnormalities. LDL 17 HgbA1c 5.3 VTE prophylaxis - heparin subq No antithrombotic prior to admission, now on ASA 325. Will consider DOAC once right BG hemorrhage resolves if still aggressive care.  Therapy recommendations:  SNF Disposition:   pending, palliative care on board, currently DNR/DNI  Atrial Fibrillation with RVR Not on [facility] prior to admission due to falls  CT repeat 9/17 Unchanged right MCA distribution infarcts with petechial hemorrhage at right BG. On amiodarone drip -> off 9/18 On metoprolol 25 bid On digoxin Continue cardiac monitoring -> NSR today with HR 60s now on ASA 325. Will consider DOAC once right BG hemorrhage resolves if aggressive care.   Respiratory failure Intubated for procedure Extubated -> re-intubated Now re-extubated -> still has difficulty with secretions CCM signed off DNR/DNI Close monitoring  Dsyphagia Speech on board On TF @ 56 Likely need PEG before back to SNF  Hypertension Home meds:  Lopressor Stable  Long-term BP goal normotensive  Hyperlipidemia Home meds:  zocor 20mg ,  resumed in hospital LDL 17, goal < 70 Off statin due to extremely low LDL  Other Stroke Risk Factors  Advanced Age >/= 44   Hx stroke/TIA  Uterine cancer  Other Active Problems Right elbow blister Left elbow wound - wound care on board  Hospital day # 9   [name], MD PhD Stroke Neurology 07/03/2021 9:50 PM       To contact Stroke Continuity provider, please refer to [email or web address] After hours, contact General Neurology

## 2021-07-03 NOTE — Care Management Important Message (Signed)
Important Message  Patient Details  Name: Dominique [name] MRN: [id number] Date of Birth: [birth year]   Medicare Important Message Given:  Yes     [name] 07/03/2021, 2:51 PM

## 2021-07-03 NOTE — Progress Notes (Signed)
Nutrition Follow-up  DOCUMENTATION CODES:   Severe malnutrition in context of chronic illness  INTERVENTION:   -Continue TF via cortrak:   Osmolite 1.2 @ 50 ml/hr  45 ml Prosource TF BID  135 ml free water flush every 6 hours  Regimen provides 1480 kcals, 88 grams protein, and 973 ml free water daily. Total free water: 1513 ml daily  NUTRITION DIAGNOSIS:   Severe Malnutrition related to chronic illness (dementia) as evidenced by moderate fat depletion, severe fat depletion, moderate muscle depletion, severe muscle depletion.  Ongoing  GOAL:   Patient will meet greater than or equal to 90% of their needs  Met with TF  MONITOR:   Labs, Weight trends, TF tolerance, Skin, I & O's  REASON FOR ASSESSMENT:   Consult Enteral/tube feeding initiation and management  ASSESSMENT:   Pt with HTN, dementia, and PAF not on anticoagulation due to frequent falls found down at home with L elbow laceration dx with R MCA stroke s/p IR for TICI 3 revascularization.  9/16- cortrak placed (gastric), TF initiated  Reviewed I/O's: -110 ml x 24 hours and +4.8 L since admission  UOP: 1.4 L x 24 hours   Per SLP notes, recommending continued NPO status.   Pt minimally interactive with this RD. She agreed to nutrition-focused physical exam, but asked this RD "why are you hurting me?" during exam. RD assured pt that this writer was not hurting her and purpose of exam. RD provided comfort and emotional support to pt.   Pt remains NPO and receiving TF via cortrak tube. She is tolerating feedings well.   Palliative care following; plan for family meeting today. Pt son does not desire g-tube and hopes pt will progress.  Medications reviewed and include colace and miralax.   Labs reviewed: CBGS: 116-161.   NUTRITION - FOCUSED PHYSICAL EXAM:  Flowsheet Row Most Recent Value  Orbital Region Severe depletion  Upper Arm Region Moderate depletion  Thoracic and Lumbar Region Moderate depletion   Buccal Region Severe depletion  Temple Region Severe depletion  Clavicle Bone Region Severe depletion  Clavicle and Acromion Bone Region Severe depletion  Scapular Bone Region Severe depletion  Dorsal Hand Moderate depletion  Patellar Region Moderate depletion  Anterior Thigh Region Moderate depletion  Posterior Calf Region Moderate depletion  Edema (RD Assessment) Moderate  Hair Reviewed  Eyes Reviewed  Mouth Reviewed  Skin Reviewed  Nails Reviewed       Diet Order:   Diet Order             Diet NPO time specified  Diet effective now                   EDUCATION NEEDS:   No education needs have been identified at this time  Skin:  Skin Assessment: Skin Integrity Issues: (L elbow laceration) Skin Integrity Issues:: Stage II Stage II: sacrum  Last BM:  07/03/21 (type 6)  Height:   Ht Readings from Last 1 Encounters:  06/24/21 4' 11.5" (1.511 m)    Weight:   Wt Readings from Last 1 Encounters:  07/03/21 51.3 kg   BMI:  Body mass index is 22.46 kg/m.  Estimated Nutritional Needs:   Kcal:  1400-1600  Protein:  70-85 grams  Fluid:  >1.5 L/day    Loistine Chance, RD, LDN, [place] Registered Dietitian II Certified Diabetes Care and Education Specialist Please refer to [facility] for RD and/or RD on-call/weekend/after hours pager

## 2021-07-04 DIAGNOSIS — Z7189 Other specified counseling: Secondary | ICD-10-CM | POA: Diagnosis not present

## 2021-07-04 DIAGNOSIS — I63511 Cerebral infarction due to unspecified occlusion or stenosis of right middle cerebral artery: Secondary | ICD-10-CM | POA: Diagnosis not present

## 2021-07-04 DIAGNOSIS — L89023 Pressure ulcer of left elbow, stage 3: Secondary | ICD-10-CM | POA: Diagnosis not present

## 2021-07-04 DIAGNOSIS — Z66 Do not resuscitate: Secondary | ICD-10-CM | POA: Diagnosis not present

## 2021-07-04 DIAGNOSIS — E43 Unspecified severe protein-calorie malnutrition: Secondary | ICD-10-CM

## 2021-07-04 DIAGNOSIS — I639 Cerebral infarction, unspecified: Secondary | ICD-10-CM | POA: Diagnosis not present

## 2021-07-04 DIAGNOSIS — Z515 Encounter for palliative care: Secondary | ICD-10-CM | POA: Diagnosis not present

## 2021-07-04 DIAGNOSIS — J9601 Acute respiratory failure with hypoxia: Secondary | ICD-10-CM | POA: Diagnosis not present

## 2021-07-04 LAB — GLUCOSE, CAPILLARY
Glucose-Capillary: 116 mg/dL — ABNORMAL HIGH (ref 70–99)
Glucose-Capillary: 123 mg/dL — ABNORMAL HIGH (ref 70–99)
Glucose-Capillary: 149 mg/dL — ABNORMAL HIGH (ref 70–99)
Glucose-Capillary: 158 mg/dL — ABNORMAL HIGH (ref 70–99)
Glucose-Capillary: 158 mg/dL — ABNORMAL HIGH (ref 70–99)
Glucose-Capillary: 161 mg/dL — ABNORMAL HIGH (ref 70–99)

## 2021-07-04 NOTE — Progress Notes (Signed)
Palliative Medicine Inpatient Follow Up Note  Consulting Provider: Marvel Plan, MD   Reason for consult:   Palliative Care Consult Services Palliative Medicine Consult  Reason for Consult? GOC discussion    HPI:  Per intake H&P --> Dominique [name] is a 87 y.o. female with a past medical history significant for atrial fibrillation not on anticoagulation due to frequent falls, baseline dementia, hypertension, hypothyroidism, uterine cancer, prior thrombocytopenia resolved more recently.   Palliative care was asked to get involved in the setting of a right MCA infarction to discussion goals of care.  Today's Discussion (07/04/2021):  *Please note that this is a verbal dictation therefore any spelling or grammatical errors are due to the "[employer] One" system interpretation.  Chart reviewed.   Per conversations with Dr. Margarito Courser continues to have tremendous secretions which she is unable to clear on her own.  Due to this she requires deep suctioning which appears to be very bothersome to the patient.  This evening we met with Dominique [name]'s daughter-in-law, Dominique [name] and son Dominique [name].  We reviewed Dominique [name]'s multiple comorbid conditions and her acute condition of having suffered a right MCA infarction.  We discussed that she is unfortunately at this time unable to utilize the left side of her body.  We discussed that she is also terribly dysphasic causing her tremendous issue with clearing of secretions.  We reviewed that she is at exceptionally high risk for aspiration and respiratory distress which is something that may happen rather acutely.  In regards to mobility status Dr. Roda Shutters had shared that he suspects she may be a patient who does not make great recovery in the setting of her age and her initial deficit.  He brought up that these are the 2 most important factors and considering recoverability.  We discussed the various options at this point in time which are either to continue treatment and  consider gastrostomy tube which is not recommended with [name]'s history of dementia or to transition focus to a more comfort oriented path.  Questions were answered regarding each path and what that would entail.  [name]'s family shares that they are not ready to make a decision one way or the other. We discussed that her situation right now is very tenuous and she is at exceptionally high risk for worsening.   Patient son, Dominique [name] would like to be present when PT/OT and speech work with the patient. I suggested he spend a prolonged amount of time with the patient to get a better impression as to what her day to day looks like. He was amenable to this.  Questions and concerns addressed   Objective Assessment: Vital Signs Vitals:   07/04/21 1119 07/04/21 1522  BP:  98/70  Pulse: 75 65  Resp: 19 18  Temp: 98.2 F (36.8 C)   SpO2: 98%     Intake/Output Summary (Last 24 hours) at 07/04/2021 1656 Last data filed at 07/04/2021 3823 Gross per 24 hour  Intake --  Output 1000 ml  Net -1000 ml    Last Weight  Most recent update: 07/03/2021  5:10 AM    Weight  51.3 kg (113 lb 1.5 oz)            Gen:  Elderly F unwell appearing HEENT: Dry mucous membranes, (+) Coretrack CV: Irregular rate and rhythm  PULM: On RA, (+) notable sounds (+) gurgling  ABD: soft/nontender  EXT: No edema Neuro: A+O x1  SUMMARY OF RECOMMENDATIONS   DNAR/DNI   GOLD  DNR on chart  Plan to observe in the oncoming days and have family present for interventions such as deep suctioning, speech therapy and PT/OT to get a better impression of patients present level of functioning  Ongoing support  Time Spent: 65 Greater than 50% of the time was spent in counseling and coordination of care ______________________________________________________________________________________ [facility] Team Team Cell Phone: [phone] Please utilize secure chat with additional  questions, if there is no response within 30 minutes please call the above phone number  Palliative Medicine Team providers are available by phone from 7am to 7pm daily and can be reached through the team cell phone.  Should this patient require assistance outside of these hours, please call the patient's attending physician.

## 2021-07-04 NOTE — Progress Notes (Signed)
STROKE TEAM PROGRESS NOTE   INTERVAL HISTORY No family at the bedside. Pt lying in bed, diffiiculty controlling secretion, gurgling sound in the throat. Per RN, pt received oral suctioning frequently but not effective. Will do NT suction with RT. Pt no improvement for the last several days, will have family meeting this pm.   Vitals:   07/03/21 2345 07/04/21 0800 07/04/21 1025 07/04/21 1119  BP: 135/84 (!) 122/47 (!) 200/165   Pulse: 79 72  75  Resp: (!) 22 20  19   Temp:  98.7 F (37.1 C)  (P) 98.2 F (36.8 C)  TempSrc:  Axillary  (P) Axillary  SpO2: 98%   98%  Weight:      Height:       CBC:  Recent Labs  Lab 07/01/21 0129 07/02/21 0539  WBC 14.5* 25.4*  HGB 10.3* 11.9*  HCT 31.2* 34.5*  MCV 101.0* 98.6  PLT 352 053*   Basic Metabolic Panel:  Recent Labs  Lab 06/28/21 0602 06/28/21 1629 06/29/21 0301 07/01/21 0129 07/02/21 0539  NA 137  --    < > 133* 131*  K 3.4*  --    < > 4.6 4.8  CL 104  --    < > 100 95*  CO2 23  --    < > 25 27  GLUCOSE 131*  --    < > 128* 207*  BUN 27*  --    < > 24* 24*  CREATININE 0.63  --    < > 0.47 0.56  CALCIUM 8.3*  --    < > 8.5* 8.9  MG 2.0 1.9  --   --   --   PHOS 2.5 1.9*  --   --   --    < > = values in this interval not displayed.    Lipid Panel:  No results for input(s): CHOL, TRIG, HDL, CHOLHDL, VLDL, LDLCALC in the last 168 hours.   HgbA1c:  No results for input(s): HGBA1C in the last 168 hours.   Alcohol Level  No results for input(s): ETH in the last 168 hours.   IMAGING past 24 hours No results found.   PHYSICAL EXAM  Temp:  [98 F (36.7 C)-98.7 F (37.1 C)] (P) 98.2 F (36.8 C) (09/23 1119) Pulse Rate:  [68-79] 75 (09/23 1119) Resp:  [19-22] 19 (09/23 1119) BP: (108-200)/(30-165) 200/165 (09/23 1025) SpO2:  [98 %-100 %] 98 % (09/23 1119)  General - cachectic, well developed, lethargic but eyes open. Right elbow edema with small blisters. Left elbow posterior deep wound on dressing. Gurgling sound  in throat  Ophthalmologic - fundi not visualized due to noncooperation.  Cardiovascular - regular heart rate and rhythm today, not in afib  Neuro - very lethargic, eyes open on voice, severe dysarthria with intangible words. Able to follow limited central commands and peripheral commands on the right hand, severe dysarthria. No gaze palsy, tracking bilaterally and slowly, however, not blinking to visual threat on the left, but blinking on the right. Left facial droop. . Right UE 3/5 bicep and in flexed position. LUE flaccid. RLE 2/5 proximal and distal toe DF/PF 3/5. LLE proximal 1/5 and distal toe DF/PF 2/5. Sensation, coordination not cooperative and gait not tested.     ASSESSMENT/PLAN Dominique [name] is a 87 y.o. female with history of atrial fibrillation not on anticoagulation due to frequent falls, baseline dementia, hypertension, hypothyroidism, uterine cancer, prior thrombocytopenia resolved more recently admitted for left-sided weakness and aphasia.  Stroke:  right MCA scattered moderate infarcts with hemorrhagic conversion at right BG due to right MCA occlusion s/p IR with TICI3, embolic secondary to AF not on AC due to fall risk  CT head No acute abnormality.  ASPECTS 10.     CTA head & neck  1. Occlusion of the proximal right M1 MCA. 2. Large area of right MCA territory penumbra (79 mL) without evidence of core infarct. 3. 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 4. Severe left (difficult to quantify but likely greater than 80%) and approximately 70% right proximal ICA stenosis in the neck due to atherosclerosis. MRI   1. Acute right MCA territory infarcts within the right cerebral hemisphere and right basal ganglia, as described. Superimposed petechial hemorrhage within the right basal ganglia (Hiedelberg classifications, HI2). 2. Punctate acute cortical infarct within the medial posterior left frontal lobe.  MRA The M1  right middle cerebral artery remains patent status post mechanical thrombectomy performed 06/24/2021. 2. Atherosclerotic irregularity of the M2 and more distal right MCA vessels. Most notably, a moderate stenosis is present within a superior division proximal M2 right MCA vessel. CT repeat 9/17 Unchanged right MCA distribution infarcts with petechial hemorrhage at the basal ganglia. 2D Echo EF 60 to 65%.  No wall motion abnormalities. LDL 17 HgbA1c 5.3 VTE prophylaxis - heparin subq No antithrombotic prior to admission, now on ASA 325. Will consider DOAC once right BG hemorrhage resolves if still aggressive care.  Therapy recommendations:  SNF Disposition:   pending family meeting today, palliative care on board, currently DNR/DNI  Atrial Fibrillation with RVR Not on [facility] prior to admission due to falls  CT repeat 9/17 Unchanged right MCA distribution infarcts with petechial hemorrhage at right BG. On amiodarone drip -> off 9/18 On metoprolol 25 bid On digoxin Continue cardiac monitoring -> NSR today with HR 60s now on ASA 325. Will consider DOAC once right BG hemorrhage resolves if aggressive care.   Respiratory failure Intubated for procedure Extubated -> re-intubated Now re-extubated -> still has significant difficulty with secretions -> ordered NT suctioning CCM signed off DNR/DNI Close monitoring  Dsyphagia Speech on board On TF @ 50 Likely need PEG before back to SNF  Hypertension Home meds:  Lopressor BP elevated today On labetalol PRN  Long-term BP goal normotensive  Hyperlipidemia Home meds:  zocor 20mg ,  resumed in hospital LDL 17, goal < 70 Off statin due to extremely low LDL  Other Stroke Risk Factors  Advanced Age >/= 75   Hx stroke/TIA  Uterine cancer  Other Active Problems Right elbow blister Left elbow wound - wound care on board B/l eye junctivitis - on cipro eye drop  Hospital day # 10   Dominique Hawking, MD PhD Stroke Neurology 07/04/2021 12:02  PM       To contact Stroke Continuity provider, please refer to [email or web address] After hours, contact General Neurology

## 2021-07-05 DIAGNOSIS — I63511 Cerebral infarction due to unspecified occlusion or stenosis of right middle cerebral artery: Secondary | ICD-10-CM | POA: Diagnosis not present

## 2021-07-05 DIAGNOSIS — Z515 Encounter for palliative care: Secondary | ICD-10-CM | POA: Diagnosis not present

## 2021-07-05 LAB — GLUCOSE, CAPILLARY
Glucose-Capillary: 130 mg/dL — ABNORMAL HIGH (ref 70–99)
Glucose-Capillary: 131 mg/dL — ABNORMAL HIGH (ref 70–99)
Glucose-Capillary: 139 mg/dL — ABNORMAL HIGH (ref 70–99)
Glucose-Capillary: 145 mg/dL — ABNORMAL HIGH (ref 70–99)
Glucose-Capillary: 146 mg/dL — ABNORMAL HIGH (ref 70–99)
Glucose-Capillary: 156 mg/dL — ABNORMAL HIGH (ref 70–99)
Glucose-Capillary: 157 mg/dL — ABNORMAL HIGH (ref 70–99)
Glucose-Capillary: 173 mg/dL — ABNORMAL HIGH (ref 70–99)

## 2021-07-05 NOTE — Progress Notes (Signed)
SLP Cancellation Note  Patient Details Name: Dominique [name] MRN: [id number] DOB: Dec 22, 1932   Cancelled treatment:       Reason Eval/Treat Not Completed: Other (comment) (SLP contacted patient's son to coordinate him observing patient with PT/OT/ST for GOC decisions) Patient's son(Ken) will contact SLP next date.  Sonia Baller, MA, CCC-SLP Speech Therapy

## 2021-07-05 NOTE — Plan of Care (Signed)
Patient confused and unable to participate in Care plan teachings.  No family noted at bedside at this time.  Patient requiring more frequent NT suctioning with thick white secretions that she is unable to clear on her own.  Problem: Education: Goal: Knowledge of disease or condition will improve Outcome: Not Progressing Goal: Knowledge of secondary prevention will improve Outcome: Not Progressing Goal: Knowledge of patient specific risk factors addressed and post discharge goals established will improve Outcome: Not Progressing Goal: Individualized Educational Video(s) Outcome: Not Progressing   Problem: Coping: Goal: Will verbalize positive feelings about self Outcome: Not Progressing Goal: Will identify appropriate support needs Outcome: Not Progressing   Problem: Health Behavior/Discharge Planning: Goal: Ability to manage health-related needs will improve Outcome: Not Progressing   Problem: Self-Care: Goal: Ability to participate in self-care as condition permits will improve Outcome: Not Progressing Goal: Verbalization of feelings and concerns over difficulty with self-care will improve Outcome: Not Progressing Goal: Ability to communicate needs accurately will improve Outcome: Not Progressing   Problem: Nutrition: Goal: Risk of aspiration will decrease Outcome: Not Progressing Goal: Dietary intake will improve Outcome: Not Progressing   Problem: Ischemic Stroke/TIA Tissue Perfusion: Goal: Complications of ischemic stroke/TIA will be minimized Outcome: Not Progressing   Problem: Education: Goal: Knowledge of General Education information will improve Description: Including pain rating scale, medication(s)/side effects and non-pharmacologic comfort measures Outcome: Not Progressing   Problem: Health Behavior/Discharge Planning: Goal: Ability to manage health-related needs will improve Outcome: Not Progressing   Problem: Clinical Measurements: Goal: Ability to  maintain clinical measurements within normal limits will improve Outcome: Not Progressing Goal: Will remain free from infection Outcome: Not Progressing Goal: Diagnostic test results will improve Outcome: Not Progressing Goal: Respiratory complications will improve Outcome: Not Progressing Goal: Cardiovascular complication will be avoided Outcome: Not Progressing   Problem: Activity: Goal: Risk for activity intolerance will decrease Outcome: Not Progressing   Problem: Nutrition: Goal: Adequate nutrition will be maintained Outcome: Not Progressing   Problem: Coping: Goal: Level of anxiety will decrease Outcome: Not Progressing   Problem: Elimination: Goal: Will not experience complications related to bowel motility Outcome: Not Progressing Goal: Will not experience complications related to urinary retention Outcome: Not Progressing   Problem: Pain Managment: Goal: General experience of comfort will improve Outcome: Not Progressing   Problem: Safety: Goal: Ability to remain free from injury will improve Outcome: Not Progressing   Problem: Skin Integrity: Goal: Risk for impaired skin integrity will decrease Outcome: Not Progressing

## 2021-07-05 NOTE — Progress Notes (Signed)
STROKE TEAM PROGRESS NOTE   INTERVAL HISTORY No family at the bedside. Pt lying in bed, and she continues to have diffiiculty controlling secretion, gurgling sound sin the throat.  . Pt no improvement for the last several days, we will need to have family meeting t to discuss goals of care as she will likely need PEG tube and prolonged nursing home care and family is to decide if they want place.  Vital signs are stable.  Neurological exam is unchanged Vitals:   07/05/21 0730 07/05/21 0835 07/05/21 1025 07/05/21 1204  BP:  (!) 116/53  (!) 118/44  Pulse: 80 80 84 65  Resp: 19 15  19   Temp:  98.8 F (37.1 C)  97.7 F (36.5 C)  TempSrc:  Oral  Oral  SpO2: 98% 98%  96%  Weight:      Height:       CBC:  Recent Labs  Lab 07/01/21 0129 07/02/21 0539  WBC 14.5* 25.4*  HGB 10.3* 11.9*  HCT 31.2* 34.5*  MCV 101.0* 98.6  PLT 352 416*   Basic Metabolic Panel:  Recent Labs  Lab 06/28/21 1629 06/29/21 0301 07/01/21 0129 07/02/21 0539  NA  --    < > 133* 131*  K  --    < > 4.6 4.8  CL  --    < > 100 95*  CO2  --    < > 25 27  GLUCOSE  --    < > 128* 207*  BUN  --    < > 24* 24*  CREATININE  --    < > 0.47 0.56  CALCIUM  --    < > 8.5* 8.9  MG 1.9  --   --   --   PHOS 1.9*  --   --   --    < > = values in this interval not displayed.    Lipid Panel:  No results for input(s): CHOL, TRIG, HDL, CHOLHDL, VLDL, LDLCALC in the last 168 hours.   HgbA1c:  No results for input(s): HGBA1C in the last 168 hours.   Alcohol Level  No results for input(s): ETH in the last 168 hours.   IMAGING past 24 hours No results found.   PHYSICAL EXAM  Temp:  [97.7 F (36.5 C)-98.9 F (37.2 C)] 97.7 F (36.5 C) (09/24 1204) Pulse Rate:  [65-84] 65 (09/24 1204) Resp:  [15-19] 19 (09/24 1204) BP: (71-155)/(44-132) 118/44 (09/24 1204) SpO2:  [96 %-98 %] 96 % (09/24 1204)  General - cachectic, frail elderly Caucasian lady., lethargic but eyes open. Right elbow edema with small blisters.  Left elbow posterior deep wound on dressing. Gurgling sound in throat  Ophthalmologic - fundi not visualized due to noncooperation.  Cardiovascular - regular heart rate and rhythm today, not in afib  Neuro - very lethargic, eyes open on voice, severe dysarthria with intangible words. Able to follow limited central commands and peripheral commands on the right hand, severe dysarthria. No gaze palsy, tracking bilaterally and slowly, however, not blinking to visual threat on the left, but blinking on the right. Left facial droop. . Right UE 3/5 bicep and in flexed position. LUE flaccid. RLE 2/5 proximal and distal toe DF/PF 3/5. LLE proximal 1/5 and distal toe DF/PF 2/5. Sensation, coordination not cooperative and gait not tested.     ASSESSMENT/PLAN Ms. ROSALI AUGELLO is a 87 y.o. female with history of atrial fibrillation not on anticoagulation due to frequent falls, baseline dementia, hypertension, hypothyroidism, uterine  cancer, prior thrombocytopenia resolved more recently admitted for left-sided weakness and aphasia.  Stroke:  right MCA scattered moderate infarcts with hemorrhagic conversion at right BG due to right MCA occlusion s/p IR with TICI3, embolic secondary to AF not on AC due to fall risk  CT head No acute abnormality.  ASPECTS 10.     CTA head & neck  1. Occlusion of the proximal right M1 MCA. 2. Large area of right MCA territory penumbra (79 mL) without evidence of core infarct. 3. 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 4. Severe left (difficult to quantify but likely greater than 80%) and approximately 70% right proximal ICA stenosis in the neck due to atherosclerosis. MRI   1. Acute right MCA territory infarcts within the right cerebral hemisphere and right basal ganglia, as described. Superimposed petechial hemorrhage within the right basal ganglia (Hiedelberg classifications, HI2). 2. Punctate acute cortical infarct  within the medial posterior left frontal lobe.  MRA The M1 right middle cerebral artery remains patent status post mechanical thrombectomy performed 06/24/2021. 2. Atherosclerotic irregularity of the M2 and more distal right MCA vessels. Most notably, a moderate stenosis is present within a superior division proximal M2 right MCA vessel. CT repeat 9/17 Unchanged right MCA distribution infarcts with petechial hemorrhage at the basal ganglia. 2D Echo EF 60 to 65%.  No wall motion abnormalities. LDL 17 HgbA1c 5.3 VTE prophylaxis - heparin subq No antithrombotic prior to admission, now on ASA 325. Will consider DOAC once right BG hemorrhage resolves if still aggressive care.  Therapy recommendations:  SNF Disposition:   pending family meeting today, palliative care on board, currently DNR/DNI  Atrial Fibrillation with RVR Not on [facility] prior to admission due to falls  CT repeat 9/17 Unchanged right MCA distribution infarcts with petechial hemorrhage at right BG. On amiodarone drip -> off 9/18 On metoprolol 25 bid On digoxin Continue cardiac monitoring -> NSR today with HR 60s now on ASA 325. Will consider DOAC once right BG hemorrhage resolves if aggressive care.   Respiratory failure Intubated for procedure Extubated -> re-intubated Now re-extubated -> still has significant difficulty with secretions -> ordered NT suctioning CCM signed off DNR/DNI Close monitoring  Dsyphagia Speech on board On TF @ 52 Likely need PEG before back to SNF  Hypertension Home meds:  Lopressor BP elevated today On labetalol PRN  Long-term BP goal normotensive  Hyperlipidemia Home meds:  zocor 20mg ,  resumed in hospital LDL 17, goal < 70 Off statin due to extremely low LDL  Other Stroke Risk Factors  Advanced Age >/= 80   Hx stroke/TIA  Uterine cancer  Other Active Problems Right elbow blister Left elbow wound - wound care on board B/l eye junctivitis - on cipro eye drop  Hospital day #  11 Patient continues to have difficulty protecting her airway airways gurgling sounds and is at high risk for aspiration pneumonia.  She is likely not be able to swallow for a while and will need a PEG tube and prolonged nursing home care.  Family needs to decide if they want this and hopefully make a decision soon.  Greater than 50% time during this 25-minute visit was spent in counseling and coordination of care and discussion with care team.  Antony Contras, MD  Stroke Neurology 07/05/2021 1:40 PM       To contact Stroke Continuity provider, please refer to [email or web address] After hours, contact General Neurology

## 2021-07-05 NOTE — Progress Notes (Signed)
   Palliative Medicine Inpatient Follow Up Note  Consulting Provider: [name], MD   Reason for consult:   [place] Palliative Medicine Consult  Reason for Consult? GOC discussion    HPI:  Per intake H&P --> Sydny J Beining is a 87 y.o. female with a past medical history significant for atrial fibrillation not on anticoagulation due to frequent falls, baseline dementia, hypertension, hypothyroidism, uterine cancer, prior thrombocytopenia resolved more recently.   Palliative care was asked to get involved in the setting of a right MCA infarction to discussion goals of care.  Today's Discussion (07/05/2021):  *Please note that this is a verbal dictation therefore any spelling or grammatical errors are due to the "[place] [facility]" system interpretation.  Chart reviewed.   Avika was visited at bedside this afternoon. She was alert and oriented to self. She was noted to be in no visible pain or discomfort as per nonverbal indicators.  There was no family present at bedside.  I asked speech therapy to try to coordinate a time with patients son, [name] so that he may get a better impression of Anahlia's impaired swallow.   Questions and concerns addressed   Objective Assessment: Vital Signs Vitals:   07/05/21 1025 07/05/21 1204  BP:  (!) 118/44  Pulse: 84 65  Resp:  19  Temp:  97.7 F (36.5 C)  SpO2:  96%    Intake/Output Summary (Last 24 hours) at 07/05/2021 1431 Last data filed at 07/05/2021 1100 Gross per 24 hour  Intake 2350 ml  Output 700 ml  Net 1650 ml    Last Weight  Most recent update: 07/03/2021  5:10 AM    Weight  51.3 kg (113 lb 1.5 oz)            Gen:  Elderly F unwell appearing HEENT: Dry mucous membranes, (+) Coretrack CV: Irregular rate and rhythm  PULM: On RA, (+) notable sounds (+) gurgling  ABD: soft/nontender  EXT: No edema Neuro: A+O x1  SUMMARY OF RECOMMENDATIONS   DNAR/DNI   GOLD DNR on chart  Plan to observe  in the oncoming days and have family present for interventions such as deep suctioning, speech therapy and PT/OT to get a better impression of patients present level of functioning  Ongoing support  Time Spent: 25 Greater than 50% of the time was spent in counseling and coordination of care ______________________________________________________________________________________ [place] Team Team Cell Phone: [phone] Please utilize secure chat with additional questions, if there is no response within 30 minutes please call the above phone number  Palliative Medicine Team providers are available by phone from 7am to 7pm daily and can be reached through the team cell phone.  Should this patient require assistance outside of these hours, please call the patient's attending physician.

## 2021-07-06 DIAGNOSIS — Z515 Encounter for palliative care: Secondary | ICD-10-CM | POA: Diagnosis not present

## 2021-07-06 DIAGNOSIS — I63511 Cerebral infarction due to unspecified occlusion or stenosis of right middle cerebral artery: Secondary | ICD-10-CM | POA: Diagnosis not present

## 2021-07-06 DIAGNOSIS — Z7189 Other specified counseling: Secondary | ICD-10-CM

## 2021-07-06 LAB — GLUCOSE, CAPILLARY
Glucose-Capillary: 172 mg/dL — ABNORMAL HIGH (ref 70–99)
Glucose-Capillary: 180 mg/dL — ABNORMAL HIGH (ref 70–99)
Glucose-Capillary: 130 mg/dL — ABNORMAL HIGH (ref 70–99)
Glucose-Capillary: 140 mg/dL — ABNORMAL HIGH (ref 70–99)
Glucose-Capillary: 118 mg/dL — ABNORMAL HIGH (ref 70–99)

## 2021-07-06 NOTE — Progress Notes (Addendum)
Palliative Medicine Inpatient Follow Up Note  Consulting Provider: [name], MD   Reason for consult:   [place] Palliative Medicine Consult  Reason for Consult? GOC discussion    HPI:  Per intake H&P --> Alayla J Ault is a 87 y.o. female with a past medical history significant for atrial fibrillation not on anticoagulation due to frequent falls, baseline dementia, hypertension, hypothyroidism, uterine cancer, prior thrombocytopenia resolved more recently.   Palliative care was asked to get involved in the setting of a right MCA infarction to discussion goals of care.  Today's Discussion (07/06/2021):  *Please note that this is a verbal dictation therefore any spelling or grammatical errors are due to the "[place] [facility]" system interpretation.  Chart reviewed.   Venna was visited this morning. She remains similar to yesterday.  She is awake and alert to self.  She does not exemplify any nonverbal indicators of distress.  I met with patients RN, Minette Brine. She shares that she has been suctioned Karmon 3 times.  She reviews whether or not Makai can have Robinul right now I provided bedside education that perhaps an anticholinergic at this time until goals are more clearly defined is not the best option.  She agrees with this.  I reached out to patient's son, [name] this morning.  I asked him when he was planning on coming to bedside as per our prior conversations he was going to come throughout much of the weekend to get a better impression as to how Rechel is functionally.  He states that he cannot commit to coming back today though he did arrange with the speech therapist yesterday to come into the hospital tomorrow at 11 AM.   I spoke to [place] and informed her of this.  I shared that my hope is we can coordinate care so that speech, PT, OT are all able to meet with patient's son and provide insights as to deliverance functional state.  Questions and  concerns addressed   Objective Assessment: Vital Signs Vitals:   07/05/21 2357 07/06/21 0431  BP: (!) 154/60 132/70  Pulse: 72 78  Resp: 20 17  Temp: 98.6 F (37 C) (!) 100.4 F (38 C)  SpO2: 94% 99%    Intake/Output Summary (Last 24 hours) at 07/06/2021 1122 Last data filed at 07/06/2021 0300 Gross per 24 hour  Intake 620 ml  Output 850 ml  Net -230 ml    Last Weight  Most recent update: 07/06/2021  3:43 AM    Weight  51.2 kg (112 lb 14 oz)            Gen:  Elderly F unwell appearing HEENT: Dry mucous membranes, (+) Coretrack CV: Irregular rate and rhythm  PULM: On RA, (+) notable sounds (+) gurgling  ABD: soft/nontender  EXT: No edema Neuro: A+O x1  SUMMARY OF RECOMMENDATIONS   DNAR/DNI   GOLD DNR on chart  Plan to observe in the oncoming days and have family present for interventions such as deep suctioning, speech therapy and PT/OT to get a better impression of patients present level of functioning.  Speech therapy plans to meet with patients son tomorrow at [place]  Patient's son shares that he is not willing to make a decision at this time and would like to have some additional days to verify what ever decision decision he makes is the "right one"  Ongoing support  Time Spent: 35 Greater than 50% of the time was spent in counseling and coordination  of care ______________________________________________________________________________________ [place] Team Team Cell Phone: [phone] Please utilize secure chat with additional questions, if there is no response within 30 minutes please call the above phone number  Palliative Medicine Team providers are available by phone from 7am to 7pm daily and can be reached through the team cell phone.  Should this patient require assistance outside of these hours, please call the patient's attending physician.

## 2021-07-06 NOTE — Progress Notes (Signed)
STROKE TEAM PROGRESS NOTE   INTERVAL HISTORY No family at the bedside. Pt lying in bed, and she continues to have diffiiculty controlling secretion, gurgling sounds in  the throat.  . Pt has not shown any major  improvement for the last several days, We are awaiting  family meeting t to discuss goals of care as she will likely need PEG tube and prolonged nursing home care and family is to decide if they want place.  Vital signs are stable.  Neurological exam is unchanged Vitals:   07/05/21 2101 07/05/21 2357 07/06/21 0342 07/06/21 0431  BP: (!) 166/60 (!) 154/60  132/70  Pulse: 90 72  78  Resp: 20 20  17   Temp: 98.5 F (36.9 C) 98.6 F (37 C)  (!) 100.4 F (38 C)  TempSrc: Oral Oral  Oral  SpO2: 94% 94%  99%  Weight:   51.2 kg   Height:       CBC:  Recent Labs  Lab 07/01/21 0129 07/02/21 0539  WBC 14.5* 25.4*  HGB 10.3* 11.9*  HCT 31.2* 34.5*  MCV 101.0* 98.6  PLT 352 147*   Basic Metabolic Panel:  Recent Labs  Lab 07/01/21 0129 07/02/21 0539  NA 133* 131*  K 4.6 4.8  CL 100 95*  CO2 25 27  GLUCOSE 128* 207*  BUN 24* 24*  CREATININE 0.47 0.56  CALCIUM 8.5* 8.9    Lipid Panel:  No results for input(s): CHOL, TRIG, HDL, CHOLHDL, VLDL, LDLCALC in the last 168 hours.   HgbA1c:  No results for input(s): HGBA1C in the last 168 hours.   Alcohol Level  No results for input(s): ETH in the last 168 hours.   IMAGING past 24 hours No results found.   PHYSICAL EXAM  Temp:  [97.9 F (36.6 C)-100.4 F (38 C)] 100.4 F (38 C) (09/25 0431) Pulse Rate:  [72-90] 78 (09/25 0431) Resp:  [17-22] 17 (09/25 0431) BP: (116-166)/(50-70) 132/70 (09/25 0431) SpO2:  [94 %-99 %] 99 % (09/25 0431) Weight:  [51.2 kg] 51.2 kg (09/25 0342)  General - cachectic, frail elderly Caucasian lady., lethargic but eyes open. Right elbow edema with small blisters. Left elbow posterior deep wound on dressing. Gurgling sound in throat  Ophthalmologic - fundi not visualized due to  noncooperation.  Cardiovascular - regular heart rate and rhythm today, not in afib  Neuro - very lethargic, eyes open on voice, severe dysarthria with intangible words. Able to follow limited central commands and peripheral commands on the right hand, severe dysarthria. No gaze palsy, tracking bilaterally and slowly, however, not blinking to visual threat on the left, but blinking on the right. Left facial droop. . Right UE 3/5 bicep and in flexed position. LUE flaccid. RLE 2/5 proximal and distal toe DF/PF 3/5. LLE proximal 1/5 and distal toe DF/PF 2/5. Sensation, coordination not cooperative and gait not tested.     ASSESSMENT/PLAN Dominique [name] is a 87 y.o. female with history of atrial fibrillation not on anticoagulation due to frequent falls, baseline dementia, hypertension, hypothyroidism, uterine cancer, prior thrombocytopenia resolved more recently admitted for left-sided weakness and aphasia.  Stroke:  right MCA scattered moderate infarcts with hemorrhagic conversion at right BG due to right MCA occlusion s/p IR with TICI3, embolic secondary to AF not on AC due to fall risk  CT head No acute abnormality.  ASPECTS 10.     CTA head & neck  1. Occlusion of the proximal right M1 MCA. 2. Large area of right  MCA territory penumbra (79 mL) without evidence of core infarct. 3. 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 4. Severe left (difficult to quantify but likely greater than 80%) and approximately 70% right proximal ICA stenosis in the neck due to atherosclerosis. MRI   1. Acute right MCA territory infarcts within the right cerebral hemisphere and right basal ganglia, as described. Superimposed petechial hemorrhage within the right basal ganglia (Hiedelberg classifications, HI2). 2. Punctate acute cortical infarct within the medial posterior left frontal lobe.  MRA The M1 right middle cerebral artery remains patent status post  mechanical thrombectomy performed 06/24/2021. 2. Atherosclerotic irregularity of the M2 and more distal right MCA vessels. Most notably, a moderate stenosis is present within a superior division proximal M2 right MCA vessel. CT repeat 9/17 Unchanged right MCA distribution infarcts with petechial hemorrhage at the basal ganglia. 2D Echo EF 60 to 65%.  No wall motion abnormalities. LDL 17 HgbA1c 5.3 VTE prophylaxis - heparin subq No antithrombotic prior to admission, now on ASA 325. Will consider DOAC once right BG hemorrhage resolves if still aggressive care.  Therapy recommendations:  SNF Disposition:   pending family meeting today, palliative care on board, currently DNR/DNI  Atrial Fibrillation with RVR Not on [facility] prior to admission due to falls  CT repeat 9/17 Unchanged right MCA distribution infarcts with petechial hemorrhage at right BG. On amiodarone drip -> off 9/18 On metoprolol 25 bid On digoxin Continue cardiac monitoring -> NSR today with HR 60s now on ASA 325. Will consider DOAC once right BG hemorrhage resolves if aggressive care.   Respiratory failure Intubated for procedure Extubated -> re-intubated Now re-extubated -> still has significant difficulty with secretions -> ordered NT suctioning CCM signed off DNR/DNI Close monitoring  Dsyphagia Speech on board On TF @ 52 Likely need PEG before back to SNF  Hypertension Home meds:  Lopressor BP elevated today On labetalol PRN  Long-term BP goal normotensive  Hyperlipidemia Home meds:  zocor 20mg ,  resumed in hospital LDL 17, goal < 70 Off statin due to extremely low LDL  Other Stroke Risk Factors  Advanced Age >/= 73   Hx stroke/TIA  Uterine cancer  Other Active Problems Right elbow blister Left elbow wound - wound care on board B/l eye junctivitis - on cipro eye drop  Hospital day # 12 Patient continues to have difficulty protecting her airway airways gurgling sounds and is at high risk for aspiration  pneumonia.  She is likely not be able to swallow for a while and will need a PEG tube and prolonged nursing home care.  Family needs to decide if they want this and hopefully make a decision soon.  Greater than 50% time during this 25-minute visit was spent in counseling and coordination of care and discussion with care team.  Discussed with patient and RN and hopefully will talk to the family later when they arrive.  Antony Contras, MD  Stroke Neurology 07/06/2021 12:20 PM       To contact Stroke Continuity provider, please refer to [email or web address] After hours, contact General Neurology team

## 2021-07-07 DIAGNOSIS — I63511 Cerebral infarction due to unspecified occlusion or stenosis of right middle cerebral artery: Secondary | ICD-10-CM | POA: Diagnosis not present

## 2021-07-07 LAB — GLUCOSE, CAPILLARY
Glucose-Capillary: 109 mg/dL — ABNORMAL HIGH (ref 70–99)
Glucose-Capillary: 143 mg/dL — ABNORMAL HIGH (ref 70–99)
Glucose-Capillary: 150 mg/dL — ABNORMAL HIGH (ref 70–99)
Glucose-Capillary: 150 mg/dL — ABNORMAL HIGH (ref 70–99)
Glucose-Capillary: 150 mg/dL — ABNORMAL HIGH (ref 70–99)
Glucose-Capillary: 151 mg/dL — ABNORMAL HIGH (ref 70–99)
Glucose-Capillary: 152 mg/dL — ABNORMAL HIGH (ref 70–99)

## 2021-07-07 NOTE — Progress Notes (Signed)
STROKE TEAM PROGRESS NOTE   INTERVAL HISTORY Patient is lying in bed.  She continues to have significant upper airway secretions and sounds.  She is drowsy but can be aroused speech is severely dysarthric and difficult to understand.  She follows commands with right side.  She remains plegic on the left vital signs stable.  Neurological exam unchanged.   Vitals:   07/07/21 0436 07/07/21 0811 07/07/21 1103 07/07/21 1246  BP: (!) 129/92 (!) 117/94 (!) 127/98 140/62  Pulse: 78 75 80 70  Resp: 20 20  19   Temp: 97.6 F (36.4 C)     TempSrc: Oral     SpO2: 100% 100%  95%  Weight:      Height:       CBC:  Recent Labs  Lab 07/01/21 0129 07/02/21 0539  WBC 14.5* 25.4*  HGB 10.3* 11.9*  HCT 31.2* 34.5*  MCV 101.0* 98.6  PLT 352 478*   Basic Metabolic Panel:  Recent Labs  Lab 07/01/21 0129 07/02/21 0539  NA 133* 131*  K 4.6 4.8  CL 100 95*  CO2 25 27  GLUCOSE 128* 207*  BUN 24* 24*  CREATININE 0.47 0.56  CALCIUM 8.5* 8.9    Lipid Panel:  No results for input(s): CHOL, TRIG, HDL, CHOLHDL, VLDL, LDLCALC in the last 168 hours.   HgbA1c:  No results for input(s): HGBA1C in the last 168 hours.   Alcohol Level  No results for input(s): ETH in the last 168 hours.   IMAGING past 24 hours No results found.   PHYSICAL EXAM  Temp:  [97.6 F (36.4 C)-98.5 F (36.9 C)] 97.6 F (36.4 C) (09/26 0436) Pulse Rate:  [70-86] 70 (09/26 1246) Resp:  [15-20] 19 (09/26 1246) BP: (117-167)/(54-146) 140/62 (09/26 1246) SpO2:  [95 %-100 %] 95 % (09/26 1246)  General - cachectic, frail elderly Caucasian lady., lethargic but eyes open. Right elbow edema with small blisters. Left elbow posterior deep wound on dressing. Gurgling sound in throat  Ophthalmologic - fundi not visualized due to noncooperation.  Cardiovascular - regular heart rate and rhythm today, not in afib  Neuro - very lethargic, eyes open on voice, severe dysarthria with intangible words. Able to follow limited  central commands and peripheral commands on the right hand, severe dysarthria. No gaze palsy, tracking bilaterally and slowly, however, not blinking to visual threat on the left, but blinking on the right. Left facial droop. . Right UE 3/5 bicep and in flexed position. LUE flaccid. RLE 2/5 proximal and distal toe DF/PF 3/5. LLE proximal 1/5 and distal toe DF/PF 2/5. Sensation, coordination not cooperative and gait not tested.     ASSESSMENT/PLAN Dominique [name] is a 87 y.o. female with history of atrial fibrillation not on anticoagulation due to frequent falls, baseline dementia, hypertension, hypothyroidism, uterine cancer, prior thrombocytopenia resolved more recently admitted for left-sided weakness and aphasia.  Stroke:  right MCA scattered moderate infarcts with hemorrhagic conversion at right BG due to right MCA occlusion s/p IR with TICI3, embolic secondary to AF not on AC due to fall risk  CT head No acute abnormality.  ASPECTS 10.     CTA head & neck  1. Occlusion of the proximal right M1 MCA. 2. Large area of right MCA territory penumbra (79 mL) without evidence of core infarct. 3. 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 4. Severe left (difficult to quantify but likely greater than 80%) and approximately 70%  right proximal ICA stenosis in the neck due to atherosclerosis. MRI   1. Acute right MCA territory infarcts within the right cerebral hemisphere and right basal ganglia, as described. Superimposed petechial hemorrhage within the right basal ganglia (Hiedelberg classifications, HI2). 2. Punctate acute cortical infarct within the medial posterior left frontal lobe.  MRA The M1 right middle cerebral artery remains patent status post mechanical thrombectomy performed 06/24/2021. 2. Atherosclerotic irregularity of the M2 and more distal right MCA vessels. Most notably, a moderate stenosis is present within a superior division  proximal M2 right MCA vessel. CT repeat 9/17 Unchanged right MCA distribution infarcts with petechial hemorrhage at the basal ganglia. 2D Echo EF 60 to 65%.  No wall motion abnormalities. LDL 17 HgbA1c 5.3 VTE prophylaxis - heparin subq No antithrombotic prior to admission, now on ASA 325. Will consider DOAC once right BG hemorrhage resolves if still aggressive care.  Therapy recommendations:  SNF Disposition:   pending family meeting today, palliative care on board, currently DNR/DNI  Atrial Fibrillation with RVR Not on [facility] prior to admission due to falls  CT repeat 9/17 Unchanged right MCA distribution infarcts with petechial hemorrhage at right BG. On amiodarone drip -> off 9/18 On metoprolol 25 bid On digoxin Continue cardiac monitoring -> NSR today with HR 60s now on ASA 325. Will consider DOAC once right BG hemorrhage resolves if aggressive care.   Respiratory failure Intubated for procedure Extubated -> re-intubated Now re-extubated -> still has significant difficulty with secretions -> ordered NT suctioning CCM signed off DNR/DNI Close monitoring  Dsyphagia Speech on board On TF @ 52 Likely need PEG before back to SNF  Hypertension Home meds:  Lopressor BP elevated today On labetalol PRN  Long-term BP goal normotensive  Hyperlipidemia Home meds:  Zocor 20mg ,  resumed in hospital LDL 17, goal < 70 Off statin due to extremely low LDL  Other Stroke Risk Factors  Advanced Age >/= 49   Hx stroke/TIA  Uterine cancer  Other Active Problems Right elbow blister Left elbow wound - wound care on board B/l eye junctivitis - on cipro eye drop  Goals of Care The patient's son is planning to meet with the speech therapist today. The  Nurse Practitioner from [place] recommended family meet with the different team members to get a better idea of the patient's current level of functioning. The pt is currently a "Gold DNR". Family working on decisions.  Hospital  day # 13  Patient has not shown significant improvement and continues to have severe dysarthria or dysphagia and dense left hemiplegia and is unlikely to be able to swallow safely anytime soon will probably need a PEG tube if family chooses to continue aggressive care.  I spoke with the patient's son over the phone and he is needs more time to discuss with other family members but states that we will make a decision soon for fluorography raised.  Greater than 50% time during this 25-minute visit was spent on counseling and coordination of care and discussion with care team and family and answering questions  Antony Contras MD      To contact Stroke Continuity provider, please refer to [email or web address] After hours, contact General Neurology team

## 2021-07-07 NOTE — Progress Notes (Signed)
Speech Language Pathology Treatment: Dysphagia  Patient Details Name: Dominique [name] MRN: [id number] DOB: 11/15/1932 Today's Date: 07/07/2021 Time: 1105-1130 SLP Time Calculation (min) (ACUTE ONLY): 25 min  Assessment / Plan / Recommendation Clinical Impression  SLP colleague contacted pt's son to coordinate observation of pt during therapy sessions with ST/OT/PT and Palliative care re: pt's abilities, prognosis and goals of care relating to swallowing. Inspection of the oral cavity revealed dried, adhered  secretions on roof of mouth d/t continuous open-mouth posture removed through implementation of oral care. Voice/respiratory quality at baseline very wet and hoarse s/p significantly impaired management of secretions. SLP used oral care to elicit cough and partially loosen secretions. SLP trialed thin liquid c/b reduced labial closure, bolus awareness and oral holding; pt unable to initiate a swallow this date. Briefly discussed/educated family on prognosis of swallow abilities based on lack of improvement thus far and Palliative care to meet with them following therapies.  ST will follow up for any change in current status, and education re: goals of care set after Palliative meeting (comfort/comfort feeds). Continue NPO with Cortrak.    HPI HPI: 87 yo F with PMHx significant for atrial fibrillation, baseline dementia, HTN, hypothyroidism, uterine cancer, and prior thrombocytopenia. Pt presented to ED with left-sided weakness and signifcant laceration of left upper extremity. Imaging revealed acute R MCA infarct involving right basal ganglia with mild petechial hemorrhage in the right putamen. Intubated 9/13 for angiogram with one way extubation 9/14 and swallow assessment ordered as pt is awake and following some commands.      SLP Plan  Continue with current plan of care      Recommendations for follow up therapy are one component of a multi-disciplinary discharge planning process, led by the  attending physician.  Recommendations may be updated based on patient status, additional functional criteria and insurance authorization.    Recommendations  Diet recommendations: NPO;Other(comment) (oral swabs for comfort) Medication Administration: Via alternative means                Oral Care Recommendations: Oral care QID Follow up Recommendations: Skilled Nursing facility SLP Visit Diagnosis: Dysphagia, unspecified (R13.10) Plan: Continue with current plan of care       GO              Dewitt Rota, SLP-Student   Dewitt Rota  07/07/2021, 12:32 PM

## 2021-07-07 NOTE — Progress Notes (Signed)
Occupational Therapy Treatment Patient Details Name: Dominique [name] MRN: [id number] DOB: 06-26-1933 Today's Date: 07/07/2021   History of present illness Dominique [name] is a 87 y.o. female found down at facility and admitted on 06/24/21 with CVA.  MRI revealed R MCA infarcts within R cerebral hemisphere and R basal ganglia, Superimposed  petechial hemorrhage within the right basal ganglia. Mild mass effect related to the right basal ganglia infarct with subtle partial effacement of the right lateral  ventricle. Punctate acute cortical infarct within the medial posterior left frontal lobe. PHMx:Artial fibrillation not on anticoagulation due to frequent falls, baseline dementia, hypertension, hypothyroidism, uterine cancer, prior thrombocytopenia resolved more recently. significant right upper extremity pain,multiple pelvic fractures and right humeral fracture in December 2020 which required significant rehabilitation.   OT comments  Pt continues to present with decreased arousal, balance, strength, functional use of LUE, and activity tolerance. Despite fatigue, pt agreeable to therapy and OOB activity. Pt's son and daughter-in-law present during session. Pt participating in [place] while supine. Once at EOB, pt with increased arousal and following of commands. Facilitating PROM of cervical spine and trunk while sitting at EOB. Max A +2 for stand pivot to recliner. Continue to recommend dc to SNF and will continue to follow acutely as admitted.    Recommendations for follow up therapy are one component of a multi-disciplinary discharge planning process, led by the attending physician.  Recommendations may be updated based on patient status, additional functional criteria and insurance authorization.    Follow Up Recommendations  SNF;Supervision/Assistance - 24 hour    Equipment Recommendations  Wheelchair (measurements OT);Wheelchair cushion (measurements OT);Hospital bed;Other (comment)     Recommendations for Other Services      Precautions / Restrictions Precautions Precautions: Fall Precaution Comments: cortrak, hx of dementia, R gaze pref       Mobility Bed Mobility Overal bed mobility: Needs Assistance Bed Mobility: Supine to Sit   Sidelying to sit: Total assist;+2 for physical assistance       General bed mobility comments: Total A for BLEs and trunk support    Transfers Overall transfer level: Needs assistance Equipment used: 2 person hand held assist Transfers: Sit to/from Stand Sit to Stand: Max assist;+2 physical assistance Stand pivot transfers: Max assist;+2 physical assistance       General transfer comment: MaxA for power up and maintaining standing balance    Balance Overall balance assessment: Needs assistance Sitting-balance support: No upper extremity supported;Feet supported Sitting balance-Leahy Scale: Poor Sitting balance - Comments: Min guard- Min A for sitting balance Postural control: Left lateral lean Standing balance support: Bilateral upper extremity supported;During functional activity Standing balance-Leahy Scale: Zero Standing balance comment: maxA+2 to maintain standing                           ADL either performed or assessed with clinical judgement   ADL Overall ADL's : Needs assistance/impaired                         Toilet Transfer: Maximal assistance;+2 for physical assistance;Stand-pivot Toilet Transfer Details (indicate cue type and reason): Max A +2 for stand pivot to recliner         Functional mobility during ADLs: Maximal assistance;+2 for physical assistance (stand pivot) General ADL Comments: Upon arrival, pt very lethargic. Increased arousal at EOB. Requiring Min Guard-Min A for sitting balance. As she fatigued, requiring increased assistance for sitting balance.  Max-Total A for grooming tasks     Vision       Perception     Praxis      Cognition Arousal/Alertness:  Lethargic Behavior During Therapy: Flat affect Overall Cognitive Status: No family/caregiver present to determine baseline cognitive functioning                                 General Comments: Hx of dementia. More alert when at EOB. Answering simple questions.        Exercises Exercises: General Upper Extremity;General Lower Extremity;Other exercises General Exercises - Upper Extremity Shoulder Flexion: PROM;Left;15 reps;AAROM;Right;10 reps;Supine Shoulder Horizontal ABduction: PROM;Left;15 reps;AAROM;Right;10 reps;Supine Shoulder Horizontal ADduction: PROM;Left;15 reps;AAROM;Right;10 reps;Supine Elbow Flexion: PROM;Left;15 reps;AAROM;Right;10 reps;Supine Elbow Extension: PROM;Left;15 reps;AAROM;Right;10 reps;Supine Wrist Flexion: PROM;Left;15 reps;AAROM;Right;10 reps;Supine Wrist Extension: PROM;Left;15 reps;AAROM;Right;10 reps;Supine Other Exercises Other Exercises: Cervical PROM; 4x L and R. Grimacing with L. Seated EOB Other Exercises: Trunk rotation. 4x L and R. seated at EOB Other Exercises: Trunk extension. 4x. seated R EOB   Shoulder Instructions       General Comments VSS on RA.    Pertinent Vitals/ Pain       Pain Assessment: Faces Faces Pain Scale: Hurts little more Pain Location: cervical PROM to L Pain Descriptors / Indicators: Grimacing Pain Intervention(s): Monitored during session;Repositioned  Home Living                                          Prior Functioning/Environment              Frequency  Min 2X/week        Progress Toward Goals  OT Goals(current goals can now be found in the care plan section)  Progress towards OT goals: Not progressing toward goals - comment (Fatigues)  Acute Rehab OT Goals Patient Stated Goal: did not state OT Goal Formulation: Patient unable to participate in goal setting Time For Goal Achievement: 07/10/21 Potential to Achieve Goals: Fair ADL Goals Pt Will Perform Eating:  with mod assist;with adaptive utensils;sitting Pt Will Perform Grooming: with max assist Pt Will Transfer to Toilet: stand pivot transfer;bedside commode;with mod assist Pt Will Perform Toileting - Clothing Manipulation and hygiene:  (Pt will be able to stand with min A to A with these tasks) Additional ADL Goal #1: Pt will be max A in and OOB for basic ADLs  Plan Discharge plan remains appropriate    Co-evaluation    PT/OT/SLP Co-Evaluation/Treatment: Yes Reason for Co-Treatment: For patient/therapist safety;To address functional/ADL transfers   OT goals addressed during session: ADL's and self-care      AM-PAC OT "6 Clicks" Daily Activity     Outcome Measure   Help from another person eating meals?: Total Help from another person taking care of personal grooming?: Total Help from another person toileting, which includes using toliet, bedpan, or urinal?: Total Help from another person bathing (including washing, rinsing, drying)?: Total Help from another person to put on and taking off regular upper body clothing?: Total Help from another person to put on and taking off regular lower body clothing?: Total 6 Click Score: 6    End of Session    OT Visit Diagnosis: Unsteadiness on feet (R26.81);Other abnormalities of gait and mobility (R26.89);Muscle weakness (generalized) (M62.81);Hemiplegia and hemiparesis;Low vision, both eyes (H54.2) Hemiplegia - Right/Left: Left Hemiplegia - dominant/non-dominant: Non-Dominant  Hemiplegia - caused by: Cerebral infarction   Activity Tolerance Patient tolerated treatment well   Patient Left with call bell/phone within reach;in chair;with chair alarm set;with family/visitor present   Nurse Communication Mobility status        Time: 8592-9244 OT Time Calculation (min): 53 min  Charges: OT General Charges $OT Visit: 1 Visit OT Treatments $Self Care/Home Management : 8-22 mins $Therapeutic Activity: 8-22 mins  Hommer Cunliffe MSOT,  OTR/L Acute Rehab Pager: [phone] Office: [place] 07/07/2021, 1:43 PM

## 2021-07-07 NOTE — Progress Notes (Signed)
Physical Therapy Treatment Patient Details Name: Dominique [name] MRN: [id number] DOB: 12/04/1932 Today's Date: 07/07/2021   History of Present Illness Dominique [name] is a 87 y.o. female found down at facility and admitted on 06/24/21 with CVA.  MRI revealed R MCA infarcts within R cerebral hemisphere and R basal ganglia, Superimposed  petechial hemorrhage within the right basal ganglia. Mild mass effect related to the right basal ganglia infarct with subtle partial effacement of the right lateral  ventricle. Punctate acute cortical infarct within the medial posterior left frontal lobe. PHMx:Artial fibrillation not on anticoagulation due to frequent falls, baseline dementia, hypertension, hypothyroidism, uterine cancer, prior thrombocytopenia resolved more recently. significant right upper extremity pain,multiple pelvic fractures and right humeral fracture in December 2020 which required significant rehabilitation.    PT Comments    Pt lethargic on arrival, but as we progressed to sitting EOB and getting to the chair, she perked up and participated.  Emphasis on warm up exercise with stress on L side, transition to EOB, sitting balance with work on truncal rotation/extension and stretches, sit to stand and transfer to the chair.  Pt's son and dtr in law were present for som family education prior to meeting with palliative medicine.   Recommendations for follow up therapy are one component of a multi-disciplinary discharge planning process, led by the attending physician.  Recommendations may be updated based on patient status, additional functional criteria and insurance authorization.  Follow Up Recommendations  SNF     Equipment Recommendations  None recommended by PT;Other (comment) (TBD`)    Recommendations for Other Services       Precautions / Restrictions Precautions Precautions: Fall Precaution Comments: cortrak, hx of dementia, R gaze pref     Mobility  Bed Mobility Overal bed  mobility: Needs Assistance Bed Mobility: Supine to Sit   Sidelying to sit: Total assist;+2 for physical assistance       General bed mobility comments: Total A for BLEs and trunk support    Transfers Overall transfer level: Needs assistance Equipment used: 2 person hand held assist Transfers: Sit to/from Stand Sit to Stand: Max assist;+2 physical assistance Stand pivot transfers: Max assist;+2 physical assistance       General transfer comment: MaxA for power up and maintaining standing balance  Ambulation/Gait                 Stairs             Wheelchair Mobility    Modified Rankin (Stroke Patients Only) Modified Rankin (Stroke Patients Only) Modified Rankin: Severe disability     Balance Overall balance assessment: Needs assistance Sitting-balance support: No upper extremity supported;Feet supported Sitting balance-Leahy Scale: Poor Sitting balance - Comments: Min guard- Min A for sitting balance Postural control: Left lateral lean Standing balance support: Bilateral upper extremity supported;During functional activity Standing balance-Leahy Scale: Zero Standing balance comment: maxA+2 to maintain standing                            Cognition Arousal/Alertness: Lethargic Behavior During Therapy: Flat affect Overall Cognitive Status: No family/caregiver present to determine baseline cognitive functioning                                 General Comments: Hx of dementia. More alert when at EOB. Answering simple questions.      Exercises General Exercises - Upper Extremity  Shoulder Flexion: PROM;Left;15 reps;AAROM;Right;10 reps;Supine Shoulder Horizontal ABduction: PROM;Left;15 reps;AAROM;Right;10 reps;Supine Shoulder Horizontal ADduction: PROM;Left;15 reps;AAROM;Right;10 reps;Supine Elbow Flexion: PROM;Left;15 reps;AAROM;Right;10 reps;Supine Elbow Extension: PROM;Left;15 reps;AAROM;Right;10 reps;Supine Wrist Flexion:  PROM;Left;15 reps;AAROM;Right;10 reps;Supine Wrist Extension: PROM;Left;15 reps;AAROM;Right;10 reps;Supine Other Exercises Other Exercises: Cervical PROM; 4x L and R. Grimacing with L. Seated EOB Other Exercises: Trunk rotation. 4x L and R. seated at EOB Other Exercises: Trunk extension. 4x. seated R EOB Other Exercises: hip/knee flexion/ext P to graded resistance L/R LE    General Comments General comments (skin integrity, edema, etc.): VSS on RA.      Pertinent Vitals/Pain Pain Assessment: Faces Faces Pain Scale: Hurts little more Pain Location: cervical PROM to L Pain Descriptors / Indicators: Grimacing Pain Intervention(s): Monitored during session    Home Living                      Prior Function            PT Goals (current goals can now be found in the care plan section) Acute Rehab PT Goals Patient Stated Goal: did not state PT Goal Formulation: Patient unable to participate in goal setting Time For Goal Achievement: 07/10/21 Potential to Achieve Goals: Fair Progress towards PT goals: Progressing toward goals    Frequency    Min 3X/week      PT Plan Current plan remains appropriate    Co-evaluation PT/OT/SLP Co-Evaluation/Treatment: Yes Reason for Co-Treatment: For patient/therapist safety PT goals addressed during session: Mobility/safety with mobility OT goals addressed during session: ADL's and self-care      AM-PAC PT "6 Clicks" Mobility   Outcome Measure  Help needed turning from your back to your side while in a flat bed without using bedrails?: Total Help needed moving from lying on your back to sitting on the side of a flat bed without using bedrails?: Total Help needed moving to and from a bed to a chair (including a wheelchair)?: Total Help needed standing up from a chair using your arms (e.g., wheelchair or bedside chair)?: Total Help needed to walk in hospital room?: Total Help needed climbing 3-5 steps with a railing? :  Total 6 Click Score: 6    End of Session Equipment Utilized During Treatment: Oxygen Activity Tolerance: Patient limited by lethargy;Patient limited by fatigue Patient left: in bed;with call bell/phone within reach Nurse Communication: Mobility status PT Visit Diagnosis: Unsteadiness on feet (R26.81);Other abnormalities of gait and mobility (R26.89);Other symptoms and signs involving the nervous system (R29.898)     Time: 5993-5701 PT Time Calculation (min) (ACUTE ONLY): 56 min  Charges:  $Therapeutic Activity: 8-22 mins $Neuromuscular Re-education: 8-22 mins                     07/07/2021  Ginger Carne., PT Acute Rehabilitation Services [phone]  (pager) [phone]  (office)   Tessie Fass Kaysey Berndt 07/07/2021, 2:27 PM

## 2021-07-08 DIAGNOSIS — I63511 Cerebral infarction due to unspecified occlusion or stenosis of right middle cerebral artery: Secondary | ICD-10-CM | POA: Diagnosis not present

## 2021-07-08 DIAGNOSIS — R1312 Dysphagia, oropharyngeal phase: Secondary | ICD-10-CM | POA: Diagnosis not present

## 2021-07-08 DIAGNOSIS — R627 Adult failure to thrive: Secondary | ICD-10-CM

## 2021-07-08 DIAGNOSIS — I639 Cerebral infarction, unspecified: Secondary | ICD-10-CM | POA: Diagnosis not present

## 2021-07-08 LAB — GLUCOSE, CAPILLARY
Glucose-Capillary: 128 mg/dL — ABNORMAL HIGH (ref 70–99)
Glucose-Capillary: 147 mg/dL — ABNORMAL HIGH (ref 70–99)
Glucose-Capillary: 112 mg/dL — ABNORMAL HIGH (ref 70–99)
Glucose-Capillary: 136 mg/dL — ABNORMAL HIGH (ref 70–99)
Glucose-Capillary: 113 mg/dL — ABNORMAL HIGH (ref 70–99)

## 2021-07-08 NOTE — Progress Notes (Addendum)
STROKE TEAM PROGRESS NOTE   INTERVAL HISTORY Patient is lying in bed.   Neurological exam unchanged. CC being discussed with family. Palliative care is already involved with care of patient.  Vital signs are stable.  Neurological exam is unchanged.  She continues to have oral secretions.  Vitals:   07/08/21 0500 07/08/21 0853 07/08/21 1100 07/08/21 1127  BP:  (!) 128/46 (!) 136/59 (!) 142/69  Pulse:  70 75 67  Resp:  18  16  Temp:  98.2 F (36.8 C)  99.1 F (37.3 C)  TempSrc:  Axillary  Axillary  SpO2:    100%  Weight: 50 kg     Height:       CBC:  Recent Labs  Lab 07/02/21 0539  WBC 25.4*  HGB 11.9*  HCT 34.5*  MCV 98.6  PLT 413*   Basic Metabolic Panel:  Recent Labs  Lab 07/02/21 0539  NA 131*  K 4.8  CL 95*  CO2 27  GLUCOSE 207*  BUN 24*  CREATININE 0.56  CALCIUM 8.9    No new imaging   PHYSICAL EXAM  Temp:  [98.2 F (36.8 C)-99.1 F (37.3 C)] 99.1 F (37.3 C) (09/27 1127) Pulse Rate:  [67-84] 67 (09/27 1127) Resp:  [16-20] 16 (09/27 1127) BP: (106-153)/(46-69) 142/69 (09/27 1127) SpO2:  [95 %-100 %] 100 % (09/27 1127) Weight:  [50 kg] 50 kg (09/27 0500)  General - cachectic, frail elderly Caucasian lady., lethargic but eyes open. Right elbow edema with small blisters. Left elbow posterior deep wound on dressing. Gurgling sound in throat  Ophthalmologic - fundi not visualized due to noncooperation.  Cardiovascular - regular heart rate and rhythm today, not in afib  Neuro - very lethargic, eyes open on voice, severe dysarthria with intangible words. Able to follow limited central commands and peripheral commands on the right hand, severe dysarthria. No gaze palsy, tracking bilaterally and slowly, however, not blinking to visual threat on the left, but blinking on the right. Left facial droop. . Right UE 3/5 bicep and in flexed position. LUE flaccid. RLE 2/5 proximal and distal toe DF/PF 3/5. LLE proximal 1/5 and distal toe DF/PF 2/5. Sensation,  coordination not cooperative and gait not tested.     ASSESSMENT/PLAN Ms. TYMESHA DITMORE is a 87 y.o. female with history of atrial fibrillation not on anticoagulation due to frequent falls, baseline dementia, hypertension, hypothyroidism, uterine cancer, prior thrombocytopenia resolved more recently admitted for left-sided weakness and aphasia.  Stroke:  right MCA scattered moderate infarcts with hemorrhagic conversion at right BG due to right MCA occlusion s/p IR with TICI3, embolic secondary to AF not on AC due to fall risk  CT head No acute abnormality.  ASPECTS 10.     CTA head & neck  1. Occlusion of the proximal right M1 MCA. 2. Large area of right MCA territory penumbra (79 mL) without evidence of core infarct. 3. 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 4. Severe left (difficult to quantify but likely greater than 80%) and approximately 70% right proximal ICA stenosis in the neck due to atherosclerosis. MRI   1. Acute right MCA territory infarcts within the right cerebral hemisphere and right basal ganglia, as described. Superimposed petechial hemorrhage within the right basal ganglia (Hiedelberg classifications, HI2). 2. Punctate acute cortical infarct within the medial posterior left frontal lobe.  MRA The M1 right middle cerebral artery remains patent status post mechanical thrombectomy performed 06/24/2021. 2. Atherosclerotic  irregularity of the M2 and more distal right MCA vessels. Most notably, a moderate stenosis is present within a superior division proximal M2 right MCA vessel. CT repeat 9/17 Unchanged right MCA distribution infarcts with petechial hemorrhage at the basal ganglia. 2D Echo EF 60 to 65%.  No wall motion abnormalities. LDL 17 HgbA1c 5.3 VTE prophylaxis - heparin subq No antithrombotic prior to admission, now on ASA 325. Will consider DOAC once right BG hemorrhage resolves if still aggressive care.   Therapy recommendations:  SNF Disposition:   pending family meeting today, palliative care on board, currently DNR/DNI  Atrial Fibrillation with RVR Not on [facility] prior to admission due to falls  CT repeat 9/17 Unchanged right MCA distribution infarcts with petechial hemorrhage at right BG. On amiodarone drip -> off 9/18 On metoprolol 25 bid On digoxin Continue cardiac monitoring -> NSR today with HR 60s now on ASA 325. Will consider DOAC once right BG hemorrhage resolves if aggressive care.   Respiratory failure Intubated for procedure Extubated -> re-intubated Now re-extubated -> still has significant difficulty with secretions -> ordered NT suctioning CCM signed off DNR/DNI Close monitoring  Dsyphagia Speech on board On TF @ 50 Likely need PEG before back to SNF  Hypertension Home meds:  Lopressor BP elevated today On labetalol PRN  Long-term BP goal normotensive  Hyperlipidemia Home meds:  Zocor 20mg ,  resumed in hospital LDL 17, goal < 70 Off statin due to extremely low LDL  Other Stroke Risk Factors  Advanced Age >/= 28   Hx stroke/TIA  Uterine cancer  Other Active Problems Right elbow blister Left elbow wound - wound care on board B/l eye junctivitis - on cipro eye drop  Goals of Care The patient's son is planning to meet with the speech therapist today. The  Nurse Practitioner from [facility] recommended family meet with the different team members to get a better idea of the patient's current level of functioning. The pt is currently a "Gold DNR". Family working on decisions.  Hospital day # Amelia, MSN, NP-C [facility] [phone]  Patient condition remains unchanged without significant improvement in her speech or swallowing.  I spoke to the patient's son yesterday over the phone family is still struggling to make decisions about comfort care versus PEG tube and will let us know soon.  Patient remains DNR/DNI.  Antony Contras MD   To contact Stroke Continuity provider, please refer to [email or web address] After hours, contact General Neurology team

## 2021-07-08 NOTE — TOC CAGE-AID Note (Signed)
Transition of Care [facility]) - CAGE-AID Screening   Patient Details  Name: Dominique [name] MRN: [id number] Date of Birth: Aug 25, 1933  Transition of [facility]) CM/SW Contact:    Takiesha Mcdevitt C Tarpley-Carter, [place] Phone Number: 07/08/2021, 12:45 PM   Clinical Narrative: Pt is unable to participate in Cage Aid.  Pt not appropriate for assessment.  Smt. Loder Tarpley-Carter, MSW, LCSW-A Pronouns:  She/Her/Hers [facility] Clinical Social Worker Direct Number:  [phone] [email or web address]   CAGE-AID Screening: Substance Abuse Screening unable to be completed due to: : Patient unable to participate (Pt not appropriate for assessment.)             Substance Abuse Education Offered: No

## 2021-07-08 NOTE — Progress Notes (Signed)
Patient ID: Dominique [name], female   DOB: 01-18-1933, 87 y.o.   MRN: [id number]    Progress Note from the Palliative Medicine Team at [facility]   Patient Name: Dominique [name]        Date: 07/08/2021 DOB: 06/01/1933  Age: 87 y.o. MRN#: [id number] Attending Physician: Stroke, Md, MD Primary Care Physician: Dominique Solian, MD Admit Date: 06/24/2021   Medical records reviewed, discussed with treatment team  87 y.o. female with a past medical history significant for atrial fibrillation not on anticoagulation due to frequent falls, baseline dementia, hypertension, hypothyroidism, uterine cancer, prior thrombocytopenia resolved more recently.  Admitted on  .06/24/2021   Brain MRI from 06/25/2021 significant impression:  IMPRESSION:     MRI brain:   1. Acute right MCA territory infarcts within the right cerebral hemisphere and right basal ganglia, as described. Superimposed petechial hemorrhage within the right basal ganglia (Hiedelberg classifications, HI2). Mild mass effect related to the right basal ganglia infarct with subtle partial effacement of the right lateral ventricle. No midline shift. 2. Punctate acute cortical infarct within the medial posterior left frontal lobe. 3. Background mild chronic small vessel ischemic changes within the cerebral white matter. 4. Small chronic lacunar infarct within right cerebellar hemisphere, a new finding as compared to the brain MRI of 01/26/2019. 5. Moderate generalized cerebral atrophy. Comparatively mild cerebellar atrophy.  Today is day 13 of this hospitalization.  Patient with severe dysphagia, non verbal, unable to follow commands.  Palliative medicine consulted help in establishing goals of care.  Initial consult completed on 06/29/2021.  Family continue to face advanced directives decisions and anticipatory care needs.  This NP visited patient at the bedside as a follow up for palliative medicine needs and emotional support.  Patient  has not shown any significant improvement and continues with severe dysarthria and severe dysphagia.  She remains n.p.o. with a core track.  She is nonverbal and unable to follow commands.  This NP made attempts to have conversation with patient's son Dominique [name] yesterday unsuccessfully.   However today I was able to speak with her son by telephone.  Education offered on current medical situation and the associated long-term poor prognosis.  Discussion was had regarding diagnosis, prognosis, GOC, EOL wishes disposition and options.   Discussion was had today regarding advanced directives.  Concepts specific to code status, artifical feeding and hydration, continued IV antibiotics and rehospitalization was had.  The difference between a aggressive medical intervention path  and a palliative comfort care path for this patient at this time was had.  Values and goals of care important to patient and family were attempted to be elicited.  Education offered on residential hospice benefit, education offered regarding the difference between aggressive medical intervention path and a palliative comfort path for this patient at this time in this situation was offered.    Created space and opportunity for patient  and family to explore thoughts and feelings regarding current medical situation.  Dominique [name] verbalizes an understanding of the seriousness of his mother situation and shares that his family is leaning towards a comfort path allowing a natural death.    However at this time he is requesting several more days before making that final decision.  He spoke today with his brother who lives in [place] and would like to "give him time to process"  Family is in agreement to meet with the palliative medicine team on Friday, September, 30 2022 at 3 PM for further clarification of goals of  care.    I encouraged Dominique [name]  to continue this conversation with Dominique [name] to  make timely decisions for this patient regarding overall  plan of care , ensuring that decisions are within the context of the patient's values and best interest.   Dominique [name]/son asked that phone calls  be limited to changes in patient's status for his mother not  for questions regarding EOL decisions until scheduled appointment on Friday with PMT.   Questions and concerns addressed.  Family encouraged to call with questions or concerns.     PMT will continue to support holistically.   Questions and concerns addressed   Discussed with  Dr Leonie Man  Total time spent on the unit was 65 minutes  Greater than 50% of the time was spent in counseling and coordination of care  Wadie Lessen NP  Palliative Medicine Team Team Phone # [phone] Pager [phone]

## 2021-07-09 DIAGNOSIS — I63511 Cerebral infarction due to unspecified occlusion or stenosis of right middle cerebral artery: Secondary | ICD-10-CM | POA: Diagnosis not present

## 2021-07-09 LAB — GLUCOSE, CAPILLARY
Glucose-Capillary: 137 mg/dL — ABNORMAL HIGH (ref 70–99)
Glucose-Capillary: 147 mg/dL — ABNORMAL HIGH (ref 70–99)
Glucose-Capillary: 160 mg/dL — ABNORMAL HIGH (ref 70–99)
Glucose-Capillary: 150 mg/dL — ABNORMAL HIGH (ref 70–99)
Glucose-Capillary: 176 mg/dL — ABNORMAL HIGH (ref 70–99)
Glucose-Capillary: 144 mg/dL — ABNORMAL HIGH (ref 70–99)

## 2021-07-09 MED ORDER — GLYCOPYRROLATE 0.2 MG/ML IJ SOLN
0.2000 mg | Freq: Four times a day (QID) | INTRAMUSCULAR | Status: DC | PRN
  Administered 2021-07-09 – 2021-07-11 (×3): 0.2 mg via INTRAVENOUS
  Filled 2021-07-09 (×3): qty 1

## 2021-07-09 NOTE — Progress Notes (Signed)
Physical Therapy Treatment Patient Details Name: Dominique [name] MRN: [id number] DOB: January 31, 1933 Today's Date: 07/09/2021   History of Present Illness Dominique [name] is a 87 y.o. female found down at facility and admitted on 06/24/21 with CVA.  MRI revealed R MCA infarcts within R cerebral hemisphere and R basal ganglia, Superimposed  petechial hemorrhage within the right basal ganglia. Mild mass effect related to the right basal ganglia infarct with subtle partial effacement of the right lateral  ventricle. Punctate acute cortical infarct within the medial posterior left frontal lobe. PHMx:Artial fibrillation not on anticoagulation due to frequent falls, baseline dementia, hypertension, hypothyroidism, uterine cancer, prior thrombocytopenia resolved more recently. significant right upper extremity pain, multiple pelvic fractures and right humeral fracture in December 2020 which required significant rehabilitation.    PT Comments    Pt received in supine, alert and participatory in therapy session as able, pt aphasic and difficult to understand, complicated by increased gurgling sound in throat when breathing/speaking, RN aware. Pt performed bed mobility and transfers with +2 maxA to totalA due to RUE hypertonicity and LUE hypotonicity as well as L lean and poor to zero standing balance. Pt needing physical assist for seated balance and weight shifting but using BLE slightly for balance correction. Pt continues to benefit from PT services to progress toward functional mobility goals. Of note, cortrak feed paused during mobility and remained off at end of session due to pt increased gurgling when restarted at end of session with pt seated upright in chair. RN notified that pt needs to be assessed. (per chart review pt has been gurgling the past few days but pt clearly states "I feel like I'm drowning" during session).   Recommendations for follow up therapy are one component of a multi-disciplinary discharge  planning process, led by the attending physician.  Recommendations may be updated based on patient status, additional functional criteria and insurance authorization.  Follow Up Recommendations  SNF     Equipment Recommendations  None recommended by PT;Other (comment) (TBD)    Recommendations for Other Services       Precautions / Restrictions Precautions Precautions: Fall Precaution Comments: cortrak, hx of dementia, R gaze pref Restrictions Weight Bearing Restrictions: No     Mobility  Bed Mobility Overal bed mobility: Needs Assistance Bed Mobility: Supine to Sit     Supine to sit: Total assist;+2 for physical assistance     General bed mobility comments: Total A for BLEs and trunk support, pt unable to physically assist    Transfers Overall transfer level: Needs assistance Equipment used: 2 person hand held assist Transfers: Sit to/from [employer] Transfers Sit to Stand: Max assist;+2 physical assistance Stand pivot transfers: Max assist;+2 physical assistance       General transfer comment: MaxA for power up and maintaining standing balance, x2 transfer pad assist and gait belt for stability. Heavy posterior lean but pt able to bear some weight through BLE while pivoting   Modified Rankin (Stroke Patients Only) Modified Rankin (Stroke Patients Only) Pre-Morbid Rankin Score: Moderately severe disability Modified Rankin: Severe disability     Balance Overall balance assessment: Needs assistance Sitting-balance support: No upper extremity supported;Feet supported Sitting balance-Leahy Scale: Poor Sitting balance - Comments: min to modA for sitting balance Postural control: Left lateral lean Standing balance support: Bilateral upper extremity supported;During functional activity Standing balance-Leahy Scale: Zero Standing balance comment: maxA+2 to maintain standing, pt with some R buckling with fatigue while pivoting and posterior/L lean  Cognition Arousal/Alertness: Awake/alert Behavior During Therapy: Flat affect Overall Cognitive Status: No family/caregiver present to determine baseline cognitive functioning   General Comments: Hx of dementia. More alert when at EOB.      Exercises General Exercises - Upper Extremity Elbow Flexion: PROM;Right;10 reps;Seated;AAROM Elbow Extension: Right;10 reps;AAROM;Seated    General Comments General comments (skin integrity, edema, etc.): Pt with significant LUE edema/dark coloration and dressing covering forearm/partially covering L elbow lesion site; pt with gurgling sound when breathing which improved slightly once seated EOB, but got worse once up in chair and tube feed turned back on so paused cortrak tube feed for safety when staff leaving room and RN notified. VSS on RA throughout. Suctioning did not improve gurgling when RN present in room and beginning of session and pt unable to cough up any phlegm throughout. Pt with significant RUE hypertonia and difficulty straightening arm and LUE hypotonia.      Pertinent Vitals/Pain Pain Assessment: Faces Faces Pain Scale: Hurts little more Pain Location: LUE and generalized discomfort Pain Descriptors / Indicators: Grimacing;Discomfort Pain Intervention(s): Limited activity within patient's tolerance;Monitored during session;Repositioned     PT Goals (current goals can now be found in the care plan section) Acute Rehab PT Goals Patient Stated Goal: did not state PT Goal Formulation: Patient unable to participate in goal setting Time For Goal Achievement: 07/10/21 Potential to Achieve Goals: Fair Progress towards PT goals: Progressing toward goals    Frequency    Min 3X/week      PT Plan Current plan remains appropriate       AM-PAC PT "6 Clicks" Mobility   Outcome Measure  Help needed turning from your back to your side while in a flat bed without using bedrails?: Total Help needed moving from lying on your back  to sitting on the side of a flat bed without using bedrails?: Total Help needed moving to and from a bed to a chair (including a wheelchair)?: Total Help needed standing up from a chair using your arms (e.g., wheelchair or bedside chair)?: Total Help needed to walk in hospital room?: Total Help needed climbing 3-5 steps with a railing? : Total 6 Click Score: 6    End of Session Equipment Utilized During Treatment: Gait belt Activity Tolerance: Patient tolerated treatment well;Patient limited by fatigue Patient left: with call bell/phone within reach;in chair;with chair alarm set;Other (comment) (mechanical lift pad under pt in chair and pillows/padding in place to prevent discomfort on her skin) Nurse Communication: Mobility status;Other (comment) (cortrak feed paused due to pt increased gurgling throat sounds and c/o feeling as if she was "drowning") PT Visit Diagnosis: Unsteadiness on feet (R26.81);Other abnormalities of gait and mobility (R26.89);Other symptoms and signs involving the nervous system (R29.898)     Time: 9741-6384 PT Time Calculation (min) (ACUTE ONLY): 27 min  Charges:  $Therapeutic Activity: 23-37 mins                     Mike Hamre P., PTA Acute Rehabilitation Services Pager: [phone] Office: [place] 07/09/2021, 2:20 PM

## 2021-07-09 NOTE — Progress Notes (Signed)
STROKE TEAM PROGRESS NOTE   INTERVAL HISTORY Patient is lying in bed.  She continues to have significant oral secretions.  She is trying to speak with speech is very dysarthric and difficult to understand.  Neurological exam unchanged.  I spoke to the patient's son over the phone and he says he has made a appointment for meeting with his brother and Perative care team to make patient full comfort care, and Friday.  Vital signs are stable.     Vitals:   07/08/21 2302 07/09/21 0323 07/09/21 0900 07/09/21 1228  BP: (!) 151/61 (!) 160/70 (!) 147/66 140/69  Pulse: 72 74  72  Resp: 18 20    Temp: 98 F (36.7 C) 98.9 F (37.2 C) (!) 97.3 F (36.3 C) 98.3 F (36.8 C)  TempSrc: Axillary Axillary Axillary Axillary  SpO2: 96% 94%  97%  Weight:      Height:       CBC:  No results for input(s): WBC, NEUTROABS, HGB, HCT, MCV, PLT in the last 168 hours.  Basic Metabolic Panel:  No results for input(s): NA, K, CL, CO2, GLUCOSE, BUN, CREATININE, CALCIUM, MG, PHOS in the last 168 hours.   No new imaging   PHYSICAL EXAM  Temp:  [97.3 F (36.3 C)-98.9 F (37.2 C)] 98.3 F (36.8 C) (09/28 1228) Pulse Rate:  [72-93] 72 (09/28 1228) Resp:  [18-21] 20 (09/28 0323) BP: (129-160)/(53-70) 140/69 (09/28 1228) SpO2:  [94 %-97 %] 97 % (09/28 1228)  General - cachectic, frail elderly Caucasian lady., lethargic but eyes open. Right elbow edema with small blisters. Left elbow posterior deep wound on dressing. Gurgling sound in throat  Ophthalmologic - fundi not visualized due to noncooperation.  Cardiovascular - regular heart rate and rhythm today, not in afib  Neuro - very lethargic, eyes open on voice, severe dysarthria with intangible words. Able to follow limited central commands and peripheral commands on the right hand, severe dysarthria. No gaze palsy, tracking bilaterally and slowly, however, not blinking to visual threat on the left, but blinking on the right. Left facial droop. . Right UE 3/5  bicep and in flexed position. LUE flaccid. RLE 2/5 proximal and distal toe DF/PF 3/5. LLE proximal 1/5 and distal toe DF/PF 2/5. Sensation, coordination not cooperative and gait not tested.     ASSESSMENT/PLAN [name] is a 87 y.o. female with history of atrial fibrillation not on anticoagulation due to frequent falls, baseline dementia, hypertension, hypothyroidism, uterine cancer, prior thrombocytopenia resolved more recently admitted for left-sided weakness and aphasia.  Stroke:  right MCA scattered moderate infarcts with hemorrhagic conversion at right BG due to right MCA occlusion s/p IR with TICI3, embolic secondary to AF not on AC due to fall risk  CT head No acute abnormality.  ASPECTS 10.     CTA head & neck  1. Occlusion of the proximal right M1 MCA. 2. Large area of right MCA territory penumbra (79 mL) without evidence of core infarct. 3. 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 4. Severe left (difficult to quantify but likely greater than 80%) and approximately 70% right proximal ICA stenosis in the neck due to atherosclerosis. MRI   1. Acute right MCA territory infarcts within the right cerebral hemisphere and right basal ganglia, as described. Superimposed petechial hemorrhage within the right basal ganglia (Hiedelberg classifications, HI2). 2. Punctate acute cortical infarct within the medial posterior left frontal lobe.  MRA The M1 right  middle cerebral artery remains patent status post mechanical thrombectomy performed 06/24/2021. 2. Atherosclerotic irregularity of the M2 and more distal right MCA vessels. Most notably, a moderate stenosis is present within a superior division proximal M2 right MCA vessel. CT repeat 9/17 Unchanged right MCA distribution infarcts with petechial hemorrhage at the basal ganglia. 2D Echo EF 60 to 65%.  No wall motion abnormalities. LDL 17 HgbA1c 5.3 VTE prophylaxis - heparin  subq No antithrombotic prior to admission, now on ASA 325. Will consider DOAC once right BG hemorrhage resolves if still aggressive care.  Therapy recommendations:  SNF Disposition:   pending family meeting today, palliative care on board, currently DNR/DNI  Atrial Fibrillation with RVR Not on [facility] prior to admission due to falls  CT repeat 9/17 Unchanged right MCA distribution infarcts with petechial hemorrhage at right BG. On amiodarone drip -> off 9/18 On metoprolol 25 bid On digoxin Continue cardiac monitoring -> NSR today with HR 60s now on ASA 325. Will consider DOAC once right BG hemorrhage resolves if aggressive care.   Respiratory failure Intubated for procedure Extubated -> re-intubated Now re-extubated -> still has significant difficulty with secretions -> ordered NT suctioning CCM signed off DNR/DNI Close monitoring  Dsyphagia Speech on board On TF @ 50 Likely need PEG before back to SNF  Hypertension Home meds:  Lopressor BP elevated today On labetalol PRN  Long-term BP goal normotensive  Hyperlipidemia Home meds:  Zocor 20mg ,  resumed in hospital LDL 17, goal < 70 Off statin due to extremely low LDL  Other Stroke Risk Factors  Advanced Age >/= 49   Hx stroke/TIA  Uterine cancer  Other Active Problems Right elbow blister Left elbow wound - wound care on board B/l eye junctivitis - on cipro eye drop  Goals of Care The patient's son is planning to meet with the speech therapist today. The  Nurse Practitioner from [place] recommended family meet with the different team members to get a better idea of the patient's current level of functioning. The pt is currently a "Gold DNR". Family working on decisions.  Hospital day # 15     Patient condition remains unchanged without significant improvement in her speech or swallowing.  I spoke to the patient's son today over the phone family is leaning towards comfort care and not PEG tube and have planned  a family meeting on Friday to make the final decision.  Patient remains DNR/DNI.  Greater than 50% time during this 25-minute visit was spent on counseling and coordination of care and discussion with care team and answering questions.  Antony Contras MD   To contact Stroke Continuity provider, please refer to [email or web address] After hours, contact General Neurology team

## 2021-07-09 NOTE — Plan of Care (Signed)
  Problem: Clinical Measurements: Goal: Will remain free from infection Outcome: Progressing Goal: Cardiovascular complication will be avoided Outcome: Progressing   Problem: Coping: Goal: Level of anxiety will decrease Outcome: Progressing   Problem: Elimination: Goal: Will not experience complications related to bowel motility Outcome: Progressing Goal: Will not experience complications related to urinary retention Outcome: Progressing   Problem: Safety: Goal: Ability to remain free from injury will improve Outcome: Progressing

## 2021-07-09 NOTE — Progress Notes (Signed)
CBG 137

## 2021-07-09 NOTE — Progress Notes (Signed)
Machine not transferring data - CBG of 147

## 2021-07-10 ENCOUNTER — Inpatient Hospital Stay (HOSPITAL_COMMUNITY): Payer: Medicare Other

## 2021-07-10 DIAGNOSIS — I63511 Cerebral infarction due to unspecified occlusion or stenosis of right middle cerebral artery: Secondary | ICD-10-CM | POA: Diagnosis not present

## 2021-07-10 LAB — GLUCOSE, CAPILLARY
Glucose-Capillary: 178 mg/dL — ABNORMAL HIGH (ref 70–99)
Glucose-Capillary: 129 mg/dL — ABNORMAL HIGH (ref 70–99)
Glucose-Capillary: 146 mg/dL — ABNORMAL HIGH (ref 70–99)
Glucose-Capillary: 138 mg/dL — ABNORMAL HIGH (ref 70–99)
Glucose-Capillary: 110 mg/dL — ABNORMAL HIGH (ref 70–99)
Glucose-Capillary: 110 mg/dL — ABNORMAL HIGH (ref 70–99)

## 2021-07-10 MED ORDER — FREE WATER
125.0000 mL | Freq: Four times a day (QID) | Status: DC
  Administered 2021-07-11: 125 mL

## 2021-07-10 MED ORDER — METOPROLOL TARTRATE 5 MG/5ML IV SOLN
5.0000 mg | Freq: Once | INTRAVENOUS | Status: AC
  Administered 2021-07-10: 5 mg via INTRAVENOUS
  Filled 2021-07-10: qty 5

## 2021-07-10 NOTE — Progress Notes (Signed)
Patient continues to have significant amount of oral secretions. RN performed nasal and oral suction. Patient tolerated fairly well. Oral secretions appearance similar to tube feedings. Feedings are off for now. Dr. Leonie Man made aware.

## 2021-07-10 NOTE — Progress Notes (Signed)
Speech Language Pathology Treatment: Dysphagia  Patient Details Name: Dominique [name] MRN: [id number] DOB: 05/03/1933 Today's Date: 07/10/2021 Time: 1438-8875 SLP Time Calculation (min) (ACUTE ONLY): 20 min  Assessment / Plan / Recommendation Clinical Impression  Pt seen for brief swallowing tx to re-assess swallow function and provide aggressive oral care d/t significant xerostomia in setting of open mouth posturing/NPO status; extremely dry oral mucosa, tongue cracked with secretions adhering to palate; oral care improved hydration and pt mouthed "that feels good" in response to oral care completion.  Pt able to follow simple oral commands with mod-max verbal/visual cueing such as "open mouth", "stick out tongue" and "press lips together" during session.  Pt able to mouth name, DOB and answers to simple questions via head nod/shake inconsistently with 50% accuracy.  Wet, congested weak attempts at coughing noted after ice chips presentation, but no swallow initiated during session.  Continue NPO status with non-oral feeding continued to sustain hydration/nutrition. ST will continue to f/u for PO readiness and speech/language/cognitive needs.    HPI HPI: 87 yo F with PMHx significant for atrial fibrillation, baseline dementia, HTN, hypothyroidism, uterine cancer, and prior thrombocytopenia. Pt presented to ED with left-sided weakness and signifcant laceration of left upper extremity. Imaging revealed acute R MCA infarct involving right basal ganglia with mild petechial hemorrhage in the right putamen. Intubated 9/13 for angiogram with one way extubation 9/14 and swallow assessment ordered as pt is awake and following some commands.      SLP Plan  Continue with current plan of care      Recommendations for follow up therapy are one component of a multi-disciplinary discharge planning process, led by the attending physician.  Recommendations may be updated based on patient status, additional  functional criteria and insurance authorization.    Recommendations  Diet recommendations: NPO;Other(comment) (oral swabs or comfort) Medication Administration: Via alternative means                Oral Care Recommendations: Oral care QID Follow up Recommendations: Skilled Nursing facility SLP Visit Diagnosis: Dysphagia, unspecified (R13.10) Plan: Continue with current plan of care                       Elvina Sidle, M.S., CCC-SLP  07/10/2021, 1:10 PM

## 2021-07-10 NOTE — Progress Notes (Signed)
Nutrition Follow-up  DOCUMENTATION CODES:  Severe malnutrition in context of chronic illness  INTERVENTION:  Continue TF via Cortrak:  -Osmolite 1.2 @ 50 ml/hr -45 ml Prosource TF BID -125 ml free water flush every 6 hours  Provides 1480 kcals, 88 grams protein, and 973 ml free water daily. Total free water: 1473 ml daily  NUTRITION DIAGNOSIS:  Severe Malnutrition related to chronic illness (dementia) as evidenced by moderate fat depletion, severe fat depletion, moderate muscle depletion, severe muscle depletion. -- ongoing  GOAL:  Patient will meet greater than or equal to 90% of their needs -- met with TF  MONITOR:  Labs, Weight trends, TF tolerance, Skin, I & O's  REASON FOR ASSESSMENT:  Consult Enteral/tube feeding initiation and management  ASSESSMENT:  Pt with HTN, dementia, and PAF not on anticoagulation due to frequent falls found down at home with L elbow laceration dx with R MCA stroke s/p IR for TICI 3 revascularization.  9/16- cortrak placed (gastric), TF initiated  Pt remains minimally interactive, NPO, and receiving TF via Cortrak. Tolerating TF well per RN, though noted to have significant oral secretions. Current TF regimen: Osmolite 1.2 @ 50 ml/hr with 45 ml Prosource TF BID and 135 ml free water flush every 6 hours. PMT following with plans for another [facility] meeting tomorrow, Friday, 9/30 to decide between PEG vs comfort.   Medications: colace, mvi with minerals, protonix, miralax Labs: Na 131 (L) CBGs 129-178 x 24 hours  UOP: 837m x24 hours I/O: +2.8L since admit  Diet Order:   Diet Order             Diet NPO time specified  Diet effective now                  EDUCATION NEEDS:  No education needs have been identified at this time  Skin:  Skin Assessment: Skin Integrity Issues: (L elbow laceration) Skin Integrity Issues:: Stage II Stage II: sacrum  Last BM:  9/28  Height:  Ht Readings from Last 1 Encounters:  06/24/21 4' 11.5" (1.511 m)    Weight:  Wt Readings from Last 1 Encounters:  07/10/21 47.2 kg   BMI:  Body mass index is 20.67 kg/m.  Estimated Nutritional Needs:  Kcal:  1400-1600 Protein:  70-85 grams Fluid:  >1.5 L/day   ALarkin Ina MS, RD, LDN (she/her/hers) RD pager number and weekend/on-call pager number located in [place]

## 2021-07-10 NOTE — Progress Notes (Signed)
STROKE TEAM PROGRESS NOTE   INTERVAL HISTORY Patient is lying in bed.  She continues to have significant oral secretions and gurgling sounds..  She is trying to speak with speech is very dysarthric and difficult to understand.  Neurological exam unchanged.   She is quite tachycardic today with heart rate in the 150-160s.  She was given extra dose of IV metoprolol.  Respiratory therapist was contacted to perform NT suction.  Vitals:   07/10/21 0801 07/10/21 1130 07/10/21 1131 07/10/21 1201  BP: (!) 132/56   101/72  Pulse: 77 (!) 161 (!) 121 (!) 120  Resp: 19 (!) 32 (!) 21 (!) 23  Temp: 99.1 F (37.3 C)   99.4 F (37.4 C)  TempSrc: Axillary   Axillary  SpO2: 98% 95% 95% 97%  Weight:      Height:       CBC:  No results for input(s): WBC, NEUTROABS, HGB, HCT, MCV, PLT in the last 168 hours.  Basic Metabolic Panel:  No results for input(s): NA, K, CL, CO2, GLUCOSE, BUN, CREATININE, CALCIUM, MG, PHOS in the last 168 hours.   No new imaging   PHYSICAL EXAM  Temp:  [97.6 F (36.4 C)-100.5 F (38.1 C)] 99.4 F (37.4 C) (09/29 1201) Pulse Rate:  [77-161] 120 (09/29 1201) Resp:  [19-32] 23 (09/29 1201) BP: (101-132)/(56-72) 101/72 (09/29 1201) SpO2:  [92 %-98 %] 97 % (09/29 1201) Weight:  [47.2 kg] 47.2 kg (09/29 0500)  General - cachectic, frail elderly Caucasian lady., lethargic but eyes open. Right elbow edema with small blisters. Left elbow posterior deep wound on dressing. Gurgling sound in throat  Ophthalmologic - fundi not visualized due to noncooperation.  Cardiovascular - regular heart rate and rhythm today, not in afib  Neuro - very lethargic, eyes open on voice, severe dysarthria with intangible words. Able to follow limited central commands and peripheral commands on the right hand, severe dysarthria. No gaze palsy, tracking bilaterally and slowly, however, not blinking to visual threat on the left, but blinking on the right. Left facial droop. . Right UE 3/5 bicep and  in flexed position. LUE flaccid. RLE 2/5 proximal and distal toe DF/PF 3/5. LLE proximal 1/5 and distal toe DF/PF 2/5. Sensation, coordination not cooperative and gait not tested.     ASSESSMENT/PLAN Ms. HILDE CHURCHMAN is a 87 y.o. female with history of atrial fibrillation not on anticoagulation due to frequent falls, baseline dementia, hypertension, hypothyroidism, uterine cancer, prior thrombocytopenia resolved more recently admitted for left-sided weakness and aphasia.  Stroke:  right MCA scattered moderate infarcts with hemorrhagic conversion at right BG due to right MCA occlusion s/p IR with TICI3, embolic secondary to AF not on AC due to fall risk  CT head No acute abnormality.  ASPECTS 10.     CTA head & neck  1. Occlusion of the proximal right M1 MCA. 2. Large area of right MCA territory penumbra (79 mL) without evidence of core infarct. 3. 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 4. Severe left (difficult to quantify but likely greater than 80%) and approximately 70% right proximal ICA stenosis in the neck due to atherosclerosis. MRI   1. Acute right MCA territory infarcts within the right cerebral hemisphere and right basal ganglia, as described. Superimposed petechial hemorrhage within the right basal ganglia (Hiedelberg classifications, HI2). 2. Punctate acute cortical infarct within the medial posterior left frontal lobe.  MRA The M1 right middle cerebral artery remains  patent status post mechanical thrombectomy performed 06/24/2021. 2. Atherosclerotic irregularity of the M2 and more distal right MCA vessels. Most notably, a moderate stenosis is present within a superior division proximal M2 right MCA vessel. CT repeat 9/17 Unchanged right MCA distribution infarcts with petechial hemorrhage at the basal ganglia. 2D Echo EF 60 to 65%.  No wall motion abnormalities. LDL 17 HgbA1c 5.3 VTE prophylaxis - heparin subq No  antithrombotic prior to admission, now on ASA 325. Will consider DOAC once right BG hemorrhage resolves if still aggressive care.  Therapy recommendations:  SNF Disposition:   pending family meeting today, palliative care on board, currently DNR/DNI  Atrial Fibrillation with RVR Not on [facility] prior to admission due to falls  CT repeat 9/17 Unchanged right MCA distribution infarcts with petechial hemorrhage at right BG. On amiodarone drip -> off 9/18 On metoprolol 25 bid On digoxin Continue cardiac monitoring -> NSR today with HR 60s now on ASA 325. Will consider DOAC once right BG hemorrhage resolves if aggressive care.   Respiratory failure Intubated for procedure Extubated -> re-intubated Now re-extubated -> still has significant difficulty with secretions -> ordered NT suctioning CCM signed off DNR/DNI Close monitoring  Dsyphagia Speech on board On TF @ 50 Likely need PEG before back to SNF  Hypertension Home meds:  Lopressor BP elevated today On labetalol PRN  Long-term BP goal normotensive  Hyperlipidemia Home meds:  Zocor 20mg ,  resumed in hospital LDL 17, goal < 70 Off statin due to extremely low LDL  Other Stroke Risk Factors  Advanced Age >/= 48   Hx stroke/TIA  Uterine cancer  Other Active Problems Right elbow blister Left elbow wound - wound care on board B/l eye junctivitis - on cipro eye drop  Goals of Care The patient's son is planning to meet with the speech therapist today. The  Nurse Practitioner from [place] recommended family meet with the different team members to get a better idea of the patient's current level of functioning. The pt is currently a "Gold DNR". Family working on decisions.  Hospital day # 16     Patient condition remains unchanged without significant improvement in her speech or swallowing.  I spoke to the patient's son yesterday y over the phone family is leaning towards comfort care and not PEG tube and have planned a  family meeting on Friday to make the final decision.  Patient was NT suctioned by respiratory therapist and copious secretions were aspirated but without significant improvement in patient's gurgling sounds.  Patient remains DNR/DNI.  Greater than 50% time during this 25-minute visit was spent on counseling and coordination of care and discussion with care team and answering questions.  Antony Contras MD   To contact Stroke Continuity provider, please refer to [email or web address] After hours, contact General Neurology team

## 2021-07-10 NOTE — Progress Notes (Signed)
Upon arrival to pt room, gurgling sound heard from pt. Pt NT suctioned per physician order. Pt allowed RT to go down left nare x2. Copious thick yellow secretions suctioned from pt. Pt tolerated fair. HR increased from 126-161. SpO2 on 3L [place] 95%. Pt continues to have gurgling sounds with minimal improvement from NTS. RT will continue to monitor and be available as needed.

## 2021-07-10 NOTE — Progress Notes (Signed)
Occupational Therapy Treatment Patient Details Name: KAMIAH FITE MRN: [id number] DOB: 28-Feb-1933 Today's Date: 07/10/2021   History of present illness VERLIE LIOTTA is a 87 y.o. female found down at facility and admitted on 06/24/21 with CVA.  MRI revealed R MCA infarcts within R cerebral hemisphere and R basal ganglia, Superimposed  petechial hemorrhage within the right basal ganglia. Mild mass effect related to the right basal ganglia infarct with subtle partial effacement of the right lateral  ventricle. Punctate acute cortical infarct within the medial posterior left frontal lobe. PHMx:Artial fibrillation not on anticoagulation due to frequent falls, baseline dementia, hypertension, hypothyroidism, uterine cancer, prior thrombocytopenia resolved more recently. significant right upper extremity pain, multiple pelvic fractures and right humeral fracture in December 2020 which required significant rehabilitation.   OT comments  Pt not making progress this session due to increased lethargy and decreased ability to follow simple commands. Session focused on bed mobility and grooming bed level, all of which requires total assist at this time. OT recs remain the same and OT will continue to follow acutely.    Recommendations for follow up therapy are one component of a multi-disciplinary discharge planning process, led by the attending physician.  Recommendations may be updated based on patient status, additional functional criteria and insurance authorization.    Follow Up Recommendations  SNF;Supervision/Assistance - 24 hour    Equipment Recommendations  Wheelchair (measurements OT);Wheelchair cushion (measurements OT);Hospital bed;Other (comment)    Recommendations for Other Services      Precautions / Restrictions Precautions Precautions: Fall Precaution Comments: cortrak, hx of dementia, R gaze pref Restrictions Weight Bearing Restrictions: No       Mobility Bed Mobility Overal bed  mobility: Needs Assistance Bed Mobility: Rolling Rolling: Total assist;+2 for safety/equipment         General bed mobility comments: Total A due to difficulty following commands and pt complainng of pain    Transfers                 General transfer comment: Deferred due to confusion and pain.    Balance                                           ADL either performed or assessed with clinical judgement   ADL Overall ADL's : Needs assistance/impaired Eating/Feeding: NPO Eating/Feeding Details (indicate cue type and reason): Peg tube in place but turned off. Grooming: Total assistance;Wash/dry hands;Wash/dry face;Oral care;Bed level Grooming Details (indicate cue type and reason): Pt not following commands this session, even with washcloth placed in hand and OT using hand over hand technique to complete grooming.                               General ADL Comments: Session focused on repositioning in bed and grooming. Limited due to pt lethargy and difficulty following commands.     Vision       Perception     Praxis      Cognition Arousal/Alertness: Lethargic Behavior During Therapy: Flat affect Overall Cognitive Status: History of cognitive impairments - at baseline                                 General Comments: Hx of dementia  Exercises     Shoulder Instructions       General Comments HR up at 130 throughout sessoin, with afib as HR went from 111-140.    Pertinent Vitals/ Pain       Pain Assessment: Faces Faces Pain Scale: Hurts little more Pain Location: generalized; pt stated "everywhere" Pain Descriptors / Indicators: Discomfort;Grimacing Pain Intervention(s): Limited activity within patient's tolerance;Monitored during session;Repositioned  Home Living                                          Prior Functioning/Environment              Frequency  Min 2X/week         Progress Toward Goals  OT Goals(current goals can now be found in the care plan section)  Progress towards OT goals: Progressing toward goals  Acute Rehab OT Goals Patient Stated Goal: did not state OT Goal Formulation: Patient unable to participate in goal setting Time For Goal Achievement: 07/24/21 Potential to Achieve Goals: Fair ADL Goals Pt Will Perform Eating: with mod assist;with adaptive utensils;sitting Pt Will Perform Grooming: with max assist Pt Will Transfer to Toilet: stand pivot transfer;bedside commode;with mod assist Pt Will Perform Toileting - Clothing Manipulation and hygiene: with max assist;with caregiver independent in assisting Additional ADL Goal #1: Pt will be max A in and OOB for basic ADLs  Plan Discharge plan remains appropriate    Co-evaluation                 AM-PAC OT "6 Clicks" Daily Activity     Outcome Measure   Help from another person eating meals?: Total Help from another person taking care of personal grooming?: Total Help from another person toileting, which includes using toliet, bedpan, or urinal?: Total Help from another person bathing (including washing, rinsing, drying)?: Total Help from another person to put on and taking off regular upper body clothing?: Total Help from another person to put on and taking off regular lower body clothing?: Total 6 Click Score: 6    End of Session    OT Visit Diagnosis: Unsteadiness on feet (R26.81);Other abnormalities of gait and mobility (R26.89);Muscle weakness (generalized) (M62.81);Hemiplegia and hemiparesis;Low vision, both eyes (H54.2) Hemiplegia - Right/Left: Left Hemiplegia - dominant/non-dominant: Non-Dominant Hemiplegia - caused by: Cerebral infarction   Activity Tolerance Patient tolerated treatment well   Patient Left in bed;with call bell/phone within reach;with bed alarm set   Nurse Communication Mobility status        Time: 2426-8341 OT Time Calculation  (min): 9 min  Charges: OT General Charges $OT Visit: 1 Visit OT Treatments $Self Care/Home Management : 8-22 mins  Briauna Gilmartin H., OTR/L Acute Rehabilitation  Naarah Borgerding Elane Yolanda Bonine 07/10/2021, 5:48 PM

## 2021-07-11 DIAGNOSIS — Z66 Do not resuscitate: Secondary | ICD-10-CM

## 2021-07-11 DIAGNOSIS — W19XXXA Unspecified fall, initial encounter: Secondary | ICD-10-CM

## 2021-07-11 DIAGNOSIS — I63511 Cerebral infarction due to unspecified occlusion or stenosis of right middle cerebral artery: Secondary | ICD-10-CM | POA: Diagnosis not present

## 2021-07-11 LAB — GLUCOSE, CAPILLARY
Glucose-Capillary: 93 mg/dL (ref 70–99)
Glucose-Capillary: 88 mg/dL (ref 70–99)
Glucose-Capillary: 88 mg/dL (ref 70–99)
Glucose-Capillary: 75 mg/dL (ref 70–99)

## 2021-07-11 MED ORDER — DOCUSATE SODIUM 50 MG/5ML PO LIQD: 100.0000 mg | Freq: Two times a day (BID) | ORAL | Status: DC | PRN

## 2021-07-11 MED ORDER — POLYVINYL ALCOHOL 1.4 % OP SOLN: 1.0000 [drp] | Freq: Four times a day (QID) | OPHTHALMIC | Status: DC | PRN

## 2021-07-11 MED ORDER — GLYCOPYRROLATE 0.2 MG/ML IJ SOLN
0.3000 mg | INTRAMUSCULAR | Status: DC | PRN
  Administered 2021-07-12: 0.3 mg via INTRAVENOUS
  Filled 2021-07-11: qty 2

## 2021-07-11 MED ORDER — LORAZEPAM 2 MG/ML IJ SOLN: 1.0000 mg | INTRAMUSCULAR | Status: DC | PRN

## 2021-07-11 MED ORDER — BIOTENE DRY MOUTH MT LIQD: 15.0000 mL | OROMUCOSAL | Status: DC | PRN

## 2021-07-11 MED ORDER — GLYCOPYRROLATE 0.2 MG/ML IJ SOLN
0.3000 mg | Freq: Two times a day (BID) | INTRAMUSCULAR | Status: DC
  Administered 2021-07-11 – 2021-07-12 (×3): 0.3 mg via INTRAVENOUS
  Filled 2021-07-11 (×3): qty 2

## 2021-07-11 MED ORDER — DIPHENHYDRAMINE HCL 50 MG/ML IJ SOLN: 12.5000 mg | INTRAMUSCULAR | Status: DC | PRN

## 2021-07-11 MED ORDER — FENTANYL CITRATE PF 50 MCG/ML IJ SOSY
25.0000 ug | PREFILLED_SYRINGE | INTRAMUSCULAR | Status: DC | PRN
  Administered 2021-07-12: 25 ug via INTRAVENOUS
  Filled 2021-07-11: qty 1

## 2021-07-11 MED ORDER — ONDANSETRON HCL 4 MG/2ML IJ SOLN: 4.0000 mg | Freq: Four times a day (QID) | INTRAMUSCULAR | Status: DC | PRN

## 2021-07-11 NOTE — Progress Notes (Signed)
Physical Therapy Treatment Patient Details Name: Dominique [name] MRN: [id number] DOB: 1933/04/13 Today's Date: 07/11/2021   History of Present Illness Dominique [name] is a 87 y.o. female found down at facility and admitted on 06/24/21 with CVA.  MRI revealed R MCA infarcts within R cerebral hemisphere and R basal ganglia, Superimposed  petechial hemorrhage within the right basal ganglia. Mild mass effect related to the right basal ganglia infarct with subtle partial effacement of the right lateral  ventricle. Punctate acute cortical infarct within the medial posterior left frontal lobe. PHMx:Artial fibrillation not on anticoagulation due to frequent falls, baseline dementia, hypertension, hypothyroidism, uterine cancer, prior thrombocytopenia resolved more recently. significant right upper extremity pain, multiple pelvic fractures and right humeral fracture in December 2020 which required significant rehabilitation.    PT Comments    Pt received in supine, alert and confused, participatory as able. Pt repositioned for comfort and edema reduction (in LUE) and pressure relief. After repositioning, pt able to cough and with notable decrease in gurgling/wet breath sounds. Pt reports comfort with RUE AAROM and LUE PROM for comfort. Pt given soft stuffed animal for RUE as it had been restless and pt smiling/appreciative. Of note, after session but before note submitted, MD changed order to comfort care, will sign off acute PT please reconsult if further mobility needs arise.  Recommendations for follow up therapy are one component of a multi-disciplinary discharge planning process, led by the attending physician.  Recommendations may be updated based on patient status, additional functional criteria and insurance authorization.  Follow Up Recommendations  SNF (pending family decision, debating comfort care per chart review)     Equipment Recommendations  None recommended by PT;Other (comment) (currently  hospital bed, mechanical lift (pending progress))    Recommendations for Other Services       Precautions / Restrictions Precautions Precautions: Fall Precaution Comments: cortrak, hx of dementia, R gaze pref, LUE edema Restrictions Weight Bearing Restrictions: No     Mobility  Bed Mobility Overal bed mobility: Needs Assistance Bed Mobility: Rolling Rolling: Total assist         General bed mobility comments: Total A due to difficulty following commands; gently repositioned for pressure relief with only partial roll    Transfers                 General transfer comment: Deferred due to pt confusion and lethargy  Ambulation/Gait                 Stairs             Wheelchair Mobility    Modified Rankin (Stroke Patients Only) Modified Rankin (Stroke Patients Only) Pre-Morbid Rankin Score: Moderately severe disability Modified Rankin: Severe disability     Balance                                            Cognition Arousal/Alertness: Lethargic Behavior During Therapy: Flat affect Overall Cognitive Status: History of cognitive impairments - at baseline                                 General Comments: Hx of dementia; pt more drowsy this date      Exercises General Exercises - Upper Extremity Shoulder Flexion: AAROM;Right;10 reps;Supine Elbow Flexion: PROM;Right;10 reps;AAROM;Left;Supine (PROM on LUE, AA on RUE)  Elbow Extension: Right;10 reps;AAROM;Seated Wrist Flexion: PROM;Left;AAROM;Right;10 reps;Supine (PROM on LUE) Wrist Extension: PROM;Left;AAROM;Right;10 reps;Supine (PROM on LUE) Digit Composite Flexion: AROM;AAROM;Right;10 reps;Supine General Exercises - Lower Extremity Ankle Circles/Pumps: PROM;AROM;Both;15 reps;Supine Heel Slides: PROM;AAROM;Both;10 reps;Supine (PROM on LLE) Hip ABduction/ADduction: AAROM;PROM;Both;10 reps;Supine (PROM on LLE) Straight Leg Raises: AAROM;Both;5 reps;Supine     General Comments General comments (skin integrity, edema, etc.): HR 80's-105 bpm; some gurgling but pt able to cough once and breathing sounds improved slightly      Pertinent Vitals/Pain Pain Assessment: Faces Faces Pain Scale: Hurts little more Pain Location: neck when pt repositioned to midline cervical position (pt had been keeping neck rotated to R side) Pain Descriptors / Indicators: Discomfort;Grimacing Pain Intervention(s): Limited activity within patient's tolerance;Monitored during session;Repositioned    Home Living                      Prior Function            PT Goals (current goals can now be found in the care plan section) Acute Rehab PT Goals Patient Stated Goal: did not state PT Goal Formulation: Patient unable to participate in goal setting Potential to Achieve Goals: Poor Progress towards PT goals: Not progressing toward goals - comment (functional decline)    Frequency    Min 3X/week      PT Plan Current plan remains appropriate    Co-evaluation              AM-PAC PT "6 Clicks" Mobility   Outcome Measure  Help needed turning from your back to your side while in a flat bed without using bedrails?: Total Help needed moving from lying on your back to sitting on the side of a flat bed without using bedrails?: Total Help needed moving to and from a bed to a chair (including a wheelchair)?: Total Help needed standing up from a chair using your arms (e.g., wheelchair or bedside chair)?: Total Help needed to walk in hospital room?: Total Help needed climbing 3-5 steps with a railing? : Total 6 Click Score: 6    End of Session   Activity Tolerance: Patient limited by lethargy Patient left: in bed;with call bell/phone within reach;with bed alarm set Nurse Communication: Mobility status PT Visit Diagnosis: Unsteadiness on feet (R26.81);Other abnormalities of gait and mobility (R26.89);Other symptoms and signs involving the nervous system  (R29.898)     Time: 2841-3244 PT Time Calculation (min) (ACUTE ONLY): 17 min  Charges:  $Therapeutic Activity: 8-22 mins                     Saidi Santacroce P., PTA Acute Rehabilitation Services Pager: [phone] Office: [place] 07/11/2021, 3:31 PM

## 2021-07-11 NOTE — Progress Notes (Signed)
STROKE TEAM PROGRESS NOTE   INTERVAL HISTORY Patient is lying in bed.  She appears more sleepy today.  She can be aroused with some difficulty.  There appeared to be decreased secretions and gurgling sounds..  She is trying to speak with speech is very dysarthric and difficult to understand.  Neurological exam unchanged.   She is less tachycardic today with heart rate in the 90s-100s.  Nurse has been performing regular borderline nasotracheal suctioning.  No family at the bedside.  Vital signs stable.  Chest x-ray yesterday shows only chronic interstitial disease no acute pulmonary findings. Vitals:   07/11/21 0314 07/11/21 0500 07/11/21 0903 07/11/21 1322  BP: (!) 118/58  (!) 110/53 (!) 82/58  Pulse: 92  99 91  Resp: (!) 21  20 16   Temp: 97.7 F (36.5 C)  98.8 F (37.1 C) 99.1 F (37.3 C)  TempSrc: Axillary  Axillary Axillary  SpO2: 100%  100% 100%  Weight:  42 kg    Height:       CBC:  No results for input(s): WBC, NEUTROABS, HGB, HCT, MCV, PLT in the last 168 hours.  Basic Metabolic Panel:  No results for input(s): NA, K, CL, CO2, GLUCOSE, BUN, CREATININE, CALCIUM, MG, PHOS in the last 168 hours.   No new imaging   PHYSICAL EXAM  Temp:  [97.7 F (36.5 C)-99.8 F (37.7 C)] 99.1 F (37.3 C) (09/30 1322) Pulse Rate:  [80-136] 91 (09/30 1322) Resp:  [16-27] 16 (09/30 1322) BP: (82-119)/(53-66) 82/58 (09/30 1322) SpO2:  [98 %-100 %] 100 % (09/30 1322) Weight:  [42 kg] 42 kg (09/30 0500)  General - cachectic, frail elderly Caucasian lady., lethargic but eyes open. Right elbow edema with small blisters. Left elbow posterior deep wound on dressing. Gurgling sound in throat  Ophthalmologic - fundi not visualized due to noncooperation.  Cardiovascular - regular heart rate and rhythm today, not in afib  Neuro - very lethargic, eyes open on voice, severe dysarthria with intangible words. Able to follow limited central commands and peripheral commands on the right hand, severe  dysarthria. No gaze palsy, tracking bilaterally and slowly, however, not blinking to visual threat on the left, but blinking on the right. Left facial droop. . Right UE 3/5 bicep and in flexed position. LUE flaccid. RLE 2/5 proximal and distal toe DF/PF 3/5. LLE proximal 1/5 and distal toe DF/PF 2/5. Sensation, coordination not cooperative and gait not tested.     ASSESSMENT/PLAN [name] is a 87 y.o. female with history of atrial fibrillation not on anticoagulation due to frequent falls, baseline dementia, hypertension, hypothyroidism, uterine cancer, prior thrombocytopenia resolved more recently admitted for left-sided weakness and aphasia.  Stroke:  right MCA scattered moderate infarcts with hemorrhagic conversion at right BG due to right MCA occlusion s/p IR with TICI3, embolic secondary to AF not on AC due to fall risk  CT head No acute abnormality.  ASPECTS 10.     CTA head & neck  1. Occlusion of the proximal right M1 MCA. 2. Large area of right MCA territory penumbra (79 mL) without evidence of core infarct. 3. 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 4. Severe left (difficult to quantify but likely greater than 80%) and approximately 70% right proximal ICA stenosis in the neck due to atherosclerosis. MRI   1. Acute right MCA territory infarcts within the right cerebral hemisphere and right basal ganglia, as described. Superimposed petechial hemorrhage within the  right basal ganglia (Hiedelberg classifications, HI2). 2. Punctate acute cortical infarct within the medial posterior left frontal lobe.  MRA The M1 right middle cerebral artery remains patent status post mechanical thrombectomy performed 06/24/2021. 2. Atherosclerotic irregularity of the M2 and more distal right MCA vessels. Most notably, a moderate stenosis is present within a superior division proximal M2 right MCA vessel. CT repeat 9/17 Unchanged right MCA  distribution infarcts with petechial hemorrhage at the basal ganglia. 2D Echo EF 60 to 65%.  No wall motion abnormalities. LDL 17 HgbA1c 5.3 VTE prophylaxis - heparin subq No antithrombotic prior to admission, now on ASA 325. Will consider DOAC once right BG hemorrhage resolves if still aggressive care.  Therapy recommendations:  SNF Disposition:   pending family meeting today, palliative care on board, currently DNR/DNI  Atrial Fibrillation with RVR Not on [facility] prior to admission due to falls  CT repeat 9/17 Unchanged right MCA distribution infarcts with petechial hemorrhage at right BG. On amiodarone drip -> off 9/18 On metoprolol 25 bid On digoxin Continue cardiac monitoring -> NSR today with HR 60s now on ASA 325. Will consider DOAC once right BG hemorrhage resolves if aggressive care.   Respiratory failure Intubated for procedure Extubated -> re-intubated Now re-extubated -> still has significant difficulty with secretions -> ordered NT suctioning CCM signed off DNR/DNI Close monitoring  Dsyphagia Speech on board On TF @ 52 Likely need PEG before back to SNF  Hypertension Home meds:  Lopressor BP elevated today On labetalol PRN  Long-term BP goal normotensive  Hyperlipidemia Home meds:  Zocor 20mg ,  resumed in hospital LDL 17, goal < 70 Off statin due to extremely low LDL  Other Stroke Risk Factors  Advanced Age >/= 45   Hx stroke/TIA  Uterine cancer  Other Active Problems Right elbow blister Left elbow wound - wound care on board B/l eye junctivitis - on cipro eye drop  Goals of Care The patient's son is planning to meet with the speech therapist today. The  Nurse Practitioner from [place] recommended family meet with the different team members to get a better idea of the patient's current level of functioning. The pt is currently a "Gold DNR". Family working on decisions.  Hospital day # 17     Patient condition remains unchanged without  significant improvement in her speech or swallowing.  I await family's decision about transition to comfort care or PEG tube hopefully today .  Patient remains DNR/DNI.     Antony Contras MD   To contact Stroke Continuity provider, please refer to [email or web address] After hours, contact General Neurology team

## 2021-07-11 NOTE — Progress Notes (Signed)
Palliative Medicine Inpatient Follow Up Note     Chart Reviewed. Patient assessed at the bedside.   Patient lethargic in bed. Does open eyes and attempts to speak. No acute distress noted. Secretions being managed by Robinul. Hypotensive.   I met with family at the bedside Prudencio Burly and Fraser Din). Updates provided. We discussed at length patient's poor prognosis and quality of life. Son shares he does not wish for patient to suffer and knows she would not wish to live in current state.   He shares she has been a resident at [facility] for several years.   [name] shares he would like to focus on comfort. He has been reviewing [facility] and is requesting consideration for her to spend what time she has left there.   Detailed education provided on comfort focused care while hospitalized and outpatient at hospice.  Family is aware patient would no longer receive medical interventions such as continuous vital signs, lab work, radiology testing, or medications not focused on comfort. All care would focus on how the patient is looking and feeling. This would include management of any symptoms that may cause discomfort, pain, shortness of breath, cough, nausea, agitation, anxiety, and/or secretions etc. Symptoms would be managed with medications and other non-pharmacological interventions such as spiritual support if requested, repositioning, music therapy, or therapeutic listening. Family verbalized understanding and appreciation.   Family verbalized understanding confirming request to transition care to focus on comfort. Education also provided on referral process to [facility].    Questions addressed and support provided.    Objective Assessment: Vital Signs Vitals:   07/11/21 0903 07/11/21 1322  BP: (!) 110/53 (!) 82/58  Pulse: 99 91  Resp: 20 16  Temp: 98.8 F (37.1 C) 99.1 F (37.3 C)  SpO2: 100% 100%    Intake/Output Summary (Last 24 hours) at 07/11/2021 1547 Last data  filed at 07/10/2021 2100 Gross per 24 hour  Intake --  Output 400 ml  Net -400 ml   Last Weight  Most recent update: 07/11/2021  5:03 AM    Weight  42 kg (92 lb 9.5 oz)            Gen:  NAD, ill appearing  HEENT: moist mucous membranes CV: Regular rate and rhythm, no murmurs rubs or gallops PULM: coarse  ABD: soft/nontender/nondistended/normal bowel sounds EXT: No edema Neuro: lethargic, will open eyes to voice, dysarthria, left facial droop  SUMMARY OF RECOMMENDATIONS   DNR/DNI Family requesting all care to focus on comfort. Education provided. Medications reviewed.  Fentanyl PRN for pain/air hunger/comfort (son states patient has allergy to codeine/oxy unsure of reaction) Robinul PRN for excessive secretions Ativan PRN for agitation/anxiety Zofran PRN for nausea Liquifilm tears PRN for dry eyes Haldol PRN for agitation/anxiety May have comfort feeding Comfort cart for family Unrestricted visitations in the setting of EOL (per policy) Oxygen PRN 2L or less for comfort. No escalation.   Family is requesting residential hospice with preferred choice of [facility]. (TOC referral placed)  PMT will continue to support and follow on as needed basis. Please secure chat for urgent needs.   Discussed with Dr. Leonie Man via secure chat.   Time Total: 50 min   Visit consisted of counseling and education dealing with the complex and emotionally intense issues of symptom management and palliative care in the setting of serious and potentially life-threatening illness.Greater than 50%  of this time was spent counseling and coordinating care related to the above assessment and plan.  Alda Lea, AGPCNP-BC  Palliative Medicine Team [phone]  Palliative Medicine Team providers are available by phone from 7am to 7pm daily and can be reached through the team cell phone. Should this patient require assistance outside of these hours, please call the patient's attending  physician.

## 2021-07-12 DIAGNOSIS — Z515 Encounter for palliative care: Secondary | ICD-10-CM

## 2021-07-12 MED ORDER — SENNOSIDES-DOCUSATE SODIUM 8.6-50 MG PO TABS: 1.0000 | ORAL_TABLET | Freq: Every evening | ORAL | Status: DC | PRN

## 2021-07-12 MED ORDER — METOPROLOL TARTRATE 50 MG PO TABS: 50.0000 mg | ORAL_TABLET | Freq: Two times a day (BID) | ORAL | Status: DC

## 2021-07-12 MED ORDER — DOCUSATE SODIUM 50 MG/5ML PO LIQD: 100.0000 mg | Freq: Two times a day (BID) | ORAL | Status: DC | PRN

## 2021-07-12 MED ORDER — PANTOPRAZOLE 2 MG/ML SUSPENSION: 40.0000 mg | Freq: Every day | ORAL | Status: DC

## 2021-07-12 MED ORDER — DIGOXIN 125 MCG PO TABS: 0.1250 mg | ORAL_TABLET | Freq: Every day | ORAL | Status: DC

## 2021-07-12 NOTE — Progress Notes (Addendum)
[facility]) Hospital Liaison Note  Received request from Coralee Pesa, SW with [facility] for family interest in [facility]. Spoke with son Doha Boling and Mr. Mitchener's significant other to confirm interest and explain services. Hospice eligibility confirmed.  Unfortunately, [facility] is not able to offer a room today. Family and TOC aware hospital liaison will follow up tomorrow or sooner if room becomes available.    Please call with any hospice related questions or concerns.  Thank you. Margaretmary Eddy, BSN, RN [facility] Liaison [phone]

## 2021-07-12 NOTE — TOC Progression Note (Signed)
Transition of Care [facility]) - Progression Note    Patient Details  Name: Dominique [name] MRN: [id number] Date of Birth: 05-09-1933  Transition of Care [facility]) CM/SW [place], [place] Phone Number: 07/12/2021, 10:27 AM  Clinical Narrative:    CSW made referral to [facility] per families request. CSW was updated there would be no beds today. CSW updated care team TOC will continue to follow.   Expected Discharge Plan: [place] Barriers to Discharge: Continued Medical Work up  Expected Discharge Plan and Services Expected Discharge Plan: [place]   Discharge Planning Services: CM Consult   Living arrangements for the past 2 months: [place]                                       Social Determinants of Health (SDOH) Interventions    Readmission Risk Interventions No flowsheet data found.

## 2021-07-12 NOTE — Progress Notes (Signed)
STROKE TEAM PROGRESS NOTE   INTERVAL HISTORY Patient is lying in bed.  Eyes open, seems more awake alert, able to follow simple commands, still has oral secretions and gurgling sound, left hemiplegia.  Palliative care on board, patient now comfort care measures.  Pending transfer to [facility] place.  However, patient still has cortrak in place.   Vitals:   07/11/21 2042 07/12/21 0353 07/12/21 0745 07/12/21 1632  BP: 90/62 (!) 114/56 (!) 113/57 117/71  Pulse: 92 (!) 50 90 94  Resp: 20 16 14 14   Temp: 97.8 F (36.6 C) 98.4 F (36.9 C) 98 F (36.7 C) 99.4 F (37.4 C)  TempSrc: Oral Oral Oral Oral  SpO2: 100% 100% 100% 98%  Weight:      Height:       CBC:  No results for input(s): WBC, NEUTROABS, HGB, HCT, MCV, PLT in the last 168 hours.  Basic Metabolic Panel:  No results for input(s): NA, K, CL, CO2, GLUCOSE, BUN, CREATININE, CALCIUM, MG, PHOS in the last 168 hours.   No new imaging   PHYSICAL EXAM  Temp:  [97.8 F (36.6 C)-99.4 F (37.4 C)] 99.4 F (37.4 C) (10/01 1632) Pulse Rate:  [50-94] 94 (10/01 1632) Resp:  [14-20] 14 (10/01 1632) BP: (90-117)/(56-71) 117/71 (10/01 1632) SpO2:  [98 %-100 %] 98 % (10/01 1632)  General - cachectic, frail elderly Caucasian lady. lethargic but eyes open. Right elbow edema with small blisters. Left elbow posterior deep wound on dressing. Gurgling sound in throat  Ophthalmologic - fundi not visualized due to noncooperation.  Cardiovascular - regular heart rate and rhythm today, not in afib  Neuro - lethargic, eyes open spontaneously, severe dysarthria with intangible words.  However, able to follow limited central commands and peripheral commands on the right hand, severe dysarthria. No gaze palsy, tracking bilaterally and slowly, however, not blinking to visual threat on the left, but blinking on the right. Left facial droop. Right UE 3/5 bicep. LUE flaccid. RLE 2+/5 proximal and distal toe DF/PF 3/5. LLE proximal 1+5 and distal toe DF/PF  2/5. Sensation, coordination not cooperative and gait not tested.     ASSESSMENT/PLAN Ms. KEONI HAVEY is a 87 y.o. female with history of atrial fibrillation not on anticoagulation due to frequent falls, baseline dementia, hypertension, hypothyroidism, uterine cancer, prior thrombocytopenia resolved more recently admitted for left-sided weakness and aphasia.  Stroke:  right MCA scattered moderate infarcts with hemorrhagic conversion at right BG due to right MCA occlusion s/p IR with TICI3, embolic secondary to AF not on AC due to fall risk  CT head No acute abnormality.  ASPECTS 10.     CTA head & neck  1. Occlusion of the proximal right M1 MCA. 2. Large area of right MCA territory penumbra (79 mL) without evidence of core infarct. 3. 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 4. Severe left (difficult to quantify but likely greater than 80%) and approximately 70% right proximal ICA stenosis in the neck due to atherosclerosis. MRI   1. Acute right MCA territory infarcts within the right cerebral hemisphere and right basal ganglia, as described. Superimposed petechial hemorrhage within the right basal ganglia (Hiedelberg classifications, HI2). 2. Punctate acute cortical infarct within the medial posterior left frontal lobe.  MRA The M1 right middle cerebral artery remains patent status post mechanical thrombectomy performed 06/24/2021. 2. Atherosclerotic irregularity of the M2 and more distal right MCA vessels. Most notably, a moderate stenosis is present  within a superior division proximal M2 right MCA vessel. CT repeat 9/17 Unchanged right MCA distribution infarcts with petechial hemorrhage at the basal ganglia. 2D Echo EF 60 to 65%.  No wall motion abnormalities. LDL 17 HgbA1c 5.3 Palliative care on board, now on comfort care measures per family request  Atrial Fibrillation with RVR Not on [facility] prior to admission due to falls  CT  repeat 9/17 Unchanged right MCA distribution infarcts with petechial hemorrhage at right BG. Was on amiodarone drip -> off 9/18 On metoprolol 25 bid On digoxin  Respiratory failure Intubated for procedure Extubated -> re-intubated Now re-extubated -> still has significant difficulty with secretions -> NT suctioning -> comfort care measures  Dsyphagia Comfort feeds Will remove cortrak  Hypertension Home meds:  Lopressor BP stable  Hyperlipidemia Home meds:  Zocor 20mg ,  resumed in hospital LDL 17, goal < 70  Other Stroke Risk Factors  Advanced Age >/= 30   Hx stroke/TIA  Uterine cancer  Other Active Problems Right elbow blister Left elbow wound - wound care   Hospital day # 18  [name], MD PhD Stroke Neurology 07/12/2021 7:06 PM   To contact Stroke Continuity provider, please refer to [email or web address] After hours, contact General Neurology team

## 2021-07-12 NOTE — Progress Notes (Signed)
Feeding tube removed per MD order for comfort. Pt tolerated well. Tip intact. Will continue to monitor.

## 2021-07-12 NOTE — Plan of Care (Signed)

## 2021-07-12 NOTE — Progress Notes (Signed)
   Palliative Medicine Inpatient Follow Up Note   Subjective:  Chart Reviewed. Patient assessed at the bedside. She appears comfortable and denies pain or distress; requests a sip of water. Remains lethargic. No family present at the bedside.   Objective Assessment: Vital Signs Vitals:   07/12/21 0353 07/12/21 0745  BP: (!) 114/56 (!) 113/57  Pulse: (!) 50 90  Resp: 16 14  Temp: 98.4 F (36.9 C) 98 F (36.7 C)  SpO2: 100% 100%    Intake/Output Summary (Last 24 hours) at 07/12/2021 3299 Last data filed at 07/12/2021 0400 Gross per 24 hour  Intake --  Output 250 ml  Net -250 ml    Last Weight  Most recent update: 07/11/2021  5:03 AM    Weight  42 kg (92 lb 9.5 oz)            Gen:  NAD, ill appearing  HEENT: cortrak secured in place CV: Regular rate PULM: normal respiratory effort without accessory muscle use ABD: soft/nontender EXT: No edema Neuro: lethargic, will open eyes to voice, left facial droop  SUMMARY OF RECOMMENDATIONS   Continue comfort medications per MAR, no changes required today Oxygen PRN 2L or less for comfort. No escalation. Use PRNs for dyspnea. Pending residential hospice placement with preferred choice of [place] PMT will continue to support and follow on as needed basis. Please secure chat for urgent needs.   Time Total: 15 min  Greater than 50% of this time was spent in counseling and coordinating care related to the above assessment and plan.  Dorthy Cooler, PA-C Palliative Medicine Team Team phone # [phone]  Thank you for allowing the Palliative Medicine Team to assist in the care of this patient. Please utilize secure chat with additional questions, if there is no response within 30 minutes please call the above phone number.  Palliative Medicine Team providers are available by phone from 7am to 7pm daily and can be reached through the team cell phone.  Should this patient require assistance outside of these hours, please  call the patient's attending physician.

## 2021-07-13 MED ORDER — GLYCOPYRROLATE 0.2 MG/ML IJ SOLN
0.4000 mg | INTRAMUSCULAR | Status: DC
  Administered 2021-07-13 – 2021-07-14 (×8): 0.4 mg via INTRAVENOUS
  Filled 2021-07-13 (×8): qty 2

## 2021-07-13 NOTE — Progress Notes (Addendum)
STROKE TEAM PROGRESS NOTE   INTERVAL HISTORY Made comfort care Friday afternoon 10/1. She appears comfortable today. She is non-verbal. Respirations still mildly labored and mildly wet. No distress.   Vitals:   07/12/21 0745 07/12/21 1632 07/12/21 2032 07/13/21 0443  BP: (!) 113/57 117/71 (!) 149/127 128/68  Pulse: 90 94 (!) 105 (!) 104  Resp: 14 14 16 19   Temp: 98 F (36.7 C) 99.4 F (37.4 C) 97.7 F (36.5 C) 98.4 F (36.9 C)  TempSrc: Oral Oral Oral Oral  SpO2: 100% 98% 99% 100%  Weight:      Height:       CBC:  No results for input(s): WBC, NEUTROABS, HGB, HCT, MCV, PLT in the last 168 hours.  Basic Metabolic Panel:  No results for input(s): NA, K, CL, CO2, GLUCOSE, BUN, CREATININE, CALCIUM, MG, PHOS in the last 168 hours.   No new imaging   PHYSICAL EXAM  Temp:  [97.7 F (36.5 C)-99.4 F (37.4 C)] 98.4 F (36.9 C) (10/02 0443) Pulse Rate:  [94-105] 104 (10/02 0443) Resp:  [14-19] 19 (10/02 0443) BP: (117-149)/(68-127) 128/68 (10/02 0443) SpO2:  [98 %-100 %] 100 % (10/02 0443)  General - cachectic, frail elderly Caucasian lady. lethargic but eyes open. Right elbow edema with small blisters. Left elbow posterior deep wound on dressing. Gurgling sound in throat  Cardiovascular - regular heart rate and rhythm today, not in afib  Neuro - lethargic, eyes open spontaneously, severe dysarthria with intangible words.  She is not following commands today and is non-verbal. No gaze palsy, Not tracking, Left facial droop. Limited exam in comfort care/hospice status.    ASSESSMENT/PLAN Ms. EZRA MARQUESS is a 87 y.o. female with history of atrial fibrillation not on anticoagulation due to frequent falls, baseline dementia, hypertension, hypothyroidism, uterine cancer, prior thrombocytopenia resolved more recently admitted for left-sided weakness and aphasia.  Stroke:  right MCA scattered moderate infarcts with hemorrhagic conversion at right BG due to right MCA occlusion s/p  IR with TICI3, embolic secondary to AF not on AC due to fall risk  CT head No acute abnormality.  ASPECTS 10.     CTA head & neck  1. Occlusion of the proximal right M1 MCA. 2. Large area of right MCA territory penumbra (79 mL) without evidence of core infarct. 3. 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 4. Severe left (difficult to quantify but likely greater than 80%) and approximately 70% right proximal ICA stenosis in the neck due to atherosclerosis. MRI   1. Acute right MCA territory infarcts within the right cerebral hemisphere and right basal ganglia, as described. Superimposed petechial hemorrhage within the right basal ganglia (Hiedelberg classifications, HI2). 2. Punctate acute cortical infarct within the medial posterior left frontal lobe.  MRA The M1 right middle cerebral artery remains patent status post mechanical thrombectomy performed 06/24/2021. 2. Atherosclerotic irregularity of the M2 and more distal right MCA vessels. Most notably, a moderate stenosis is present within a superior division proximal M2 right MCA vessel. CT repeat 9/17 Unchanged right MCA distribution infarcts with petechial hemorrhage at the basal ganglia. 2D Echo EF 60 to 65%.  No wall motion abnormalities. LDL 17 HgbA1c 5.3 Palliative care on board, now on comfort care measures per family request  Atrial Fibrillation with RVR Not on [facility] prior to admission due to falls  CT repeat 9/17 Unchanged right MCA distribution infarcts with petechial hemorrhage at right BG. Was on amiodarone drip ->  off 9/18 On metoprolol 25 bid On digoxin  Respiratory failure Intubated for procedure Extubated -> re-intubated Now re-extubated -> still has significant difficulty with secretions -> NT suctioning -> comfort care measures  Dsyphagia Comfort feeds Cortrak removed  Hypertension Home meds:  Lopressor BP stable  Hyperlipidemia Home meds:  Zocor 20mg ,   resumed in hospital LDL 17, goal < 70  Other Stroke Risk Factors  Advanced Age >/= 70   Hx stroke/TIA  Uterine cancer  Other Active Problems Right elbow blister Left elbow wound - wound care   Hospital day # 19 Patient seen and examined with Dr. Erlinda Hong. He directed the plan of care.   ATTENDING NOTE: I reviewed above note and agree with the assessment and plan. Pt was seen and examined.   No family at bedside.  Patient obtunded, not as weak as alert yesterday.  Not answer questions, still has intermittent coughing and gurgling sound, but not in distress.  Cortrak has been removed. Continue comfort care measures.    For detailed assessment and plan, please refer to above as I have made changes wherever appropriate.   [name], MD PhD Stroke Neurology 07/13/2021 6:18 PM    To contact Stroke Continuity provider, please refer to [email or web address] After hours, contact General Neurology team

## 2021-07-13 NOTE — Progress Notes (Addendum)
3825 Bedside shift report. Pt arousals to voice. Pt denies pain. WCTM  0836 Pt resting, easy to awaken. Peri and foley care performed, pt turned and repositioned. Pt denies needs at this time, WCTM.   1031 Pt sleeping, easy to arouse, turned and repositioned, denies needs, WCTM.  1233 Pt resting comfortably, NAD, WCTM.   99 Pt still resting comfortably, pt repositioned, NAD, nods head appropriately when asked if in pain, wants sip of water. Needs addressed, WCTM.   0539 Pt sleeping comfortably, WCTM.   1815 Family at bedside, updated with POC. Pt resting comfortably, nods her head "yes" when asked if she was cold, warm blanket applied. Pt nods head "no" when asked if she would like a sip of water and/or her mouth swabbed out, also to pain. No needs at this time, WCTM.

## 2021-07-13 NOTE — Progress Notes (Signed)
[facility]) Hospital Liaison Note   Received request from Coralee Pesa, SW with [facility] for family interest in [facility]. Hospice eligibility confirmed.   Unfortunately, [employer] [facility] is not able to offer a room today. Family and TOC aware hospital liaison will follow up tomorrow or sooner if room becomes available.      Please call with any hospice related questions or concerns.  Clementeen Hoof, BSN, [employer] [phone]

## 2021-07-13 NOTE — Plan of Care (Addendum)
Pt is accepted at hospice, currently comfort care measures only as inpatient. Awaiting room for patient at [facility].

## 2021-07-13 NOTE — Progress Notes (Signed)
   Palliative Medicine Inpatient Follow Up Note   Subjective:  Chart Reviewed. Patient assessed at the bedside. She shakes her head no to deny pain or distress. Remains lethargic. No family present at the bedside.   Objective Assessment: Vital Signs Vitals:   07/12/21 2032 07/13/21 0443  BP: (!) 149/127 128/68  Pulse: (!) 105 (!) 104  Resp: 16 19  Temp: 97.7 F (36.5 C) 98.4 F (36.9 C)  SpO2: 99% 100%    Intake/Output Summary (Last 24 hours) at 07/13/2021 1500 Last data filed at 07/13/2021 0106 Gross per 24 hour  Intake --  Output 500 ml  Net -500 ml    Last Weight  Most recent update: 07/11/2021  5:03 AM    Weight  42 kg (92 lb 9.5 oz)            Gen:  NAD, ill appearing  HEENT: normal, excessive secretions heard on exam CV: Regular rate PULM: normal respiratory effort without accessory muscle use ABD: soft/nontender EXT: No edema Neuro: lethargic, will open eyes to voice, left facial droop  SUMMARY OF RECOMMENDATIONS   Continue comfort care Robinul scheduled Q4H for excessive secretions Oxygen PRN 2L or less for comfort. No escalation. Use PRNs for dyspnea. Pending residential hospice placement with preferred choice of [place] PMT will continue to support and follow on as needed basis. Please secure chat for urgent needs.   Time Total: 15 min  Greater than 50% of this time was spent in counseling and coordinating care related to the above assessment and plan.  Dorthy Cooler, PA-C Palliative Medicine Team Team phone # [phone]  Thank you for allowing the Palliative Medicine Team to assist in the care of this patient. Please utilize secure chat with additional questions, if there is no response within 30 minutes please call the above phone number.  Palliative Medicine Team providers are available by phone from 7am to 7pm daily and can be reached through the team cell phone.  Should this patient require assistance outside of these hours, please  call the patient's attending physician.

## 2021-07-14 LAB — RESP PANEL BY RT-PCR (FLU A&B, COVID) ARPGX2
Influenza A by PCR: NEGATIVE
Influenza B by PCR: NEGATIVE
SARS Coronavirus 2 by RT PCR: NEGATIVE

## 2021-07-14 MED ORDER — ACETAMINOPHEN 650 MG RE SUPP: 650.0000 mg | RECTAL | 0 refills | Status: AC | PRN

## 2021-07-14 MED ORDER — POLYVINYL ALCOHOL 1.4 % OP SOLN: 1.0000 [drp] | Freq: Four times a day (QID) | OPHTHALMIC | 0 refills | Status: AC | PRN

## 2021-07-14 MED ORDER — ORAL CARE MOUTH RINSE: 15.0000 mL | Freq: Two times a day (BID) | OROMUCOSAL | 0 refills | Status: AC

## 2021-07-14 MED ORDER — ONDANSETRON HCL 4 MG/2ML IJ SOLN: 4.0000 mg | Freq: Four times a day (QID) | INTRAMUSCULAR | 0 refills | Status: AC | PRN

## 2021-07-14 MED ORDER — GLYCOPYRROLATE 0.2 MG/ML IJ SOLN: 0.4000 mg | INTRAMUSCULAR | Status: AC

## 2021-07-14 MED ORDER — LORAZEPAM 2 MG/ML IJ SOLN: 1.0000 mg | INTRAMUSCULAR | 0 refills | Status: AC | PRN

## 2021-07-14 MED ORDER — DIPHENHYDRAMINE HCL 50 MG/ML IJ SOLN: 12.5000 mg | INTRAMUSCULAR | 0 refills | Status: AC | PRN

## 2021-07-14 MED ORDER — FENTANYL CITRATE PF 50 MCG/ML IJ SOSY: 25.0000 ug | PREFILLED_SYRINGE | INTRAMUSCULAR | 0 refills | Status: AC | PRN

## 2021-07-14 NOTE — Progress Notes (Signed)
Report called to Tiajuana Amass, RN at [facility]. All questions answered.

## 2021-07-14 NOTE — Progress Notes (Signed)
RN spoke with [name] by phone, updated with info from CM: [facility] estimates transport around 6 pm, but may be later. Son states family would like plant and radio to transfer with patient, RN advised [facility] does not transport plants, son stated understood, will aim to pick up personal items before patient transported.

## 2021-07-14 NOTE — Progress Notes (Signed)
   Palliative Medicine Inpatient Follow Up Note     Chart Reviewed. Patient assessed at the bedside.   Patient lethargic. Non-Verbal. Appears comfortable in bed with stuffed animal in arms. No acute distress noted. Some secretions. Receiving scheduled Robinul. Care remains comfort focused. Patient scheduled to transfer to [facility] today.   Son has been updated and confirms plans.   Objective Assessment: Vital Signs Vitals:   07/13/21 0443 07/13/21 2045  BP: 128/68 (!) 149/83  Pulse: (!) 104 (!) 110  Resp: 19 19  Temp: 98.4 F (36.9 C) 98.2 F (36.8 C)  SpO2: 100% 92%    Intake/Output Summary (Last 24 hours) at 07/14/2021 1257 Last data filed at 07/13/2021 2213 Gross per 24 hour  Intake --  Output 550 ml  Net -550 ml   Last Weight  Most recent update: 07/11/2021  5:03 AM    Weight  42 kg (92 lb 9.5 oz)            Gen:  Lethargic, non-verbal, cachectic, ill appearing HEENT: moist mucous membranes CV: Tachycardic PULM: Coarse bilaterally    SUMMARY OF RECOMMENDATIONS   Continue with comfort focused care Scopolamine patch  Plans to d/c to [facility] later today.  PMT will continue to support and follow on as needed basis. Please secure chat for urgent needs.     Time Total: 25 min.   Visit consisted of counseling and education dealing with the complex and emotionally intense issues of symptom management and palliative care in the setting of serious and potentially life-threatening illness.Greater than 50%  of this time was spent counseling and coordinating care related to the above assessment and plan.  Alda Lea, AGPCNP-BC  Palliative Medicine Team [phone]  Palliative Medicine Team providers are available by phone from 7am to 7pm daily and can be reached through the team cell phone. Should this patient require assistance outside of these hours, please call the patient's attending physician.

## 2021-07-14 NOTE — Progress Notes (Signed)
Manufacturing [employer] [facility])  [facility] has a bed for Ms. Trochez today.  Spoke with son, he confirmed this is still his desire. He is meeting with our SW at 1 pm to complete necessary forms. ACC will update TOC manager once this is completed and transport can be arranged at that time.   RN staff, please call report to [phone] at any time. Room is assigned when report is called. Also, once [facility] arrives to pick her up, please call her [name] and let him know.  Thank you, Venia Carbon RN, BSN, [place] Liaison

## 2021-07-14 NOTE — TOC Transition Note (Signed)
Transition of Care [facility]) - CM/SW Discharge Note   Patient Details  Name: JIMMA ORTMAN MRN: [id number] Date of Birth: 1933/06/13  [facility] CM/SW Contact:  Pollie Friar, RN Phone Number: 07/14/2021, 12:37 PM   Clinical Narrative:    Patient is discharging to [name] today. [facility] will provide transport. D/c packet at the desk and CM will update bedside RN.  Number for report: [phone]   Final next level of care: [place] Barriers to Discharge: No Barriers Identified   Patient Goals and CMS Choice        Discharge Placement                       Discharge Plan and Services   Discharge Planning Services: CM Consult                                 Social Determinants of Health (SDOH) Interventions     Readmission Risk Interventions No flowsheet data found.

## 2021-07-14 NOTE — Plan of Care (Signed)
  Problem: Acute Rehab PT Goals(only PT should resolve) Goal: Pt Will Go Supine/Side To Sit Outcome: Adequate for Discharge Goal: Patient Will Transfer Sit To/From Stand Outcome: Adequate for Discharge Goal: Pt Will Transfer Bed To Chair/Chair To Bed Outcome: Adequate for Discharge Goal: Pt Will Ambulate Outcome: Adequate for Discharge   Problem: Acute Rehab OT Goals (only OT should resolve) Goal: Pt. Will Perform Eating Outcome: Adequate for Discharge Goal: Pt. Will Perform Grooming Outcome: Adequate for Discharge Goal: Pt. Will Transfer To Toilet Outcome: Adequate for Discharge Goal: Pt. Will Perform Toileting-Clothing Manipulation Outcome: Adequate for Discharge Goal: OT Additional ADL Goal #1 Outcome: Adequate for Discharge   Problem: Inadequate Intake (NI-2.1) Goal: Food and/or nutrient delivery Description: Individualized approach for food/nutrient provision. Outcome: Adequate for Discharge   Problem: SLP Dysphagia Goals Goal: Misc Dysphagia Goal Outcome: Adequate for Discharge

## 2021-07-14 NOTE — Discharge Summary (Addendum)
Patient ID: Dominique [name]   MRN: [id number]      DOB: Aug 14, 1933  Date of Admission: 06/24/2021 Date of Discharge: 07/14/2021  Attending Physician:  Stroke, Md, MD, Stroke MD Consultant(s):    Palliative Care Patient's PCP:  [name], MD  DISCHARGE DIAGNOSIS:  Right MCA scattered moderate infarcts with hemorrhagic conversion at right BG due to right MCA occlusion s/p IR with TICI3, embolic secondary to AF not on AC due to fall risk  Acute ischemic right MCA stroke ([place]) Acute respiratory failure with hypoxia ([place]) Pressure ulcer Protein-calorie malnutrition, severe End of life care Atrial fibrillation Hypertension Left hemiparesis Dysphagia   Past Medical History:  Diagnosis Date   Anemia    Cancer ([place])    uterine   Hypertension    Hyponatremia    Hypothyroidism    LVH (left ventricular hypertrophy)    PAF (paroxysmal atrial fibrillation) ([place])    a. initial dx 2012 in setting of acute illness. b. began to recur 09/2017; started on amiodarone 11/2017.   Shingles    Thrombocytopenia ([place])    Past Surgical History:  Procedure Laterality Date   ABDOMINAL HYSTERECTOMY     IR CT HEAD LTD  06/24/2021   IR PERCUTANEOUS ART THROMBECTOMY/INFUSION INTRACRANIAL INC DIAG ANGIO  06/24/2021   IR US GUIDE VASC ACCESS RIGHT  06/24/2021   JOINT REPLACEMENT Bilateral    knees   RADIOLOGY WITH ANESTHESIA N/A 06/24/2021   Procedure: IR WITH ANESTHESIA;  Surgeon: Luanne Bras, MD;  Location: [facility];  Service: Radiology;  Laterality: N/A;    Family History Family History  Problem Relation Age of Onset   Tuberculosis Father    Alcoholism Brother    Hypertension Son     Social History  reports that she has never smoked. She has never used smokeless tobacco. She reports that she does not drink alcohol and does not use drugs.  Allergies as of 07/14/2021       Reactions   Codeine Other (See Comments)   Unknown per son   Darvocet [propoxyphene N-acetaminophen] Other  (See Comments)   Unknown per son   Demerol Other (See Comments)   Unknown per son   Percocet [oxycodone-acetaminophen] Other (See Comments)   Unknown per son   Prednisone    Uncertain this is true. Was documented when patient was admitted 02/12/2012, but not recalled by either the patient nor her son. Tolerating it easily 08/2016.         Medication List     STOP taking these medications    acetaminophen 500 MG tablet Commonly known as: TYLENOL Replaced by: acetaminophen 650 MG suppository   aspirin 81 MG chewable tablet   calcitonin (salmon) 200 UNIT/ACT nasal spray Commonly known as: MIACALCIN/FORTICAL   [employer] Senior Tabs   clotrimazole 10 MG troche Commonly known as: MYCELEX   donepezil 10 MG tablet Commonly known as: ARICEPT   HYDROcodone-acetaminophen 5-325 MG tablet Commonly known as: NORCO/VICODIN   levothyroxine 50 MCG tablet Commonly known as: SYNTHROID   lidocaine 5 % Commonly known as: LIDODERM   metoprolol tartrate 25 MG tablet Commonly known as: LOPRESSOR   mirtazapine 7.5 MG tablet Commonly known as: REMERON   OVER THE COUNTER MEDICATION   simvastatin 20 MG tablet Commonly known as: ZOCOR   Vitamin D3 25 MCG (1000 UT) Caps       TAKE these medications    acetaminophen 650 MG suppository Commonly known as: TYLENOL Place 1 suppository (650 mg  total) rectally every 4 (four) hours as needed for mild pain (or temp > 37.5 C (99.5 F)). Replaces: acetaminophen 500 MG tablet   diphenhydrAMINE 50 MG/ML injection Commonly known as: BENADRYL Inject 0.25 mLs (12.5 mg total) into the vein every 4 (four) hours as needed for itching.   fentaNYL 50 MCG/ML injection Commonly known as: SUBLIMAZE Inject 0.5 mLs (25 mcg total) into the vein every 30 (thirty) minutes as needed for severe pain (or dyspnea).   glycopyrrolate 0.2 MG/ML injection Commonly known as: ROBINUL Inject 2 mLs (0.4 mg total) into the vein every 4 (four) hours.   LORazepam 2  MG/ML injection Commonly known as: ATIVAN Inject 0.5 mLs (1 mg total) into the vein every 4 (four) hours as needed for anxiety.   mouth rinse Liqd solution 15 mLs by Mouth Rinse route 2 times daily at 12 noon and 4 pm.   ondansetron 4 MG/2ML Soln injection Commonly known as: ZOFRAN Inject 2 mLs (4 mg total) into the vein every 6 (six) hours as needed for nausea.   polyvinyl alcohol 1.4 % ophthalmic solution Commonly known as: LIQUIFILM TEARS Place 1 drop into both eyes 4 (four) times daily as needed for dry eyes.        HOME MEDICATIONS PRIOR TO ADMISSION Medications Prior to Admission  Medication Sig Dispense Refill   acetaminophen (TYLENOL) 500 MG tablet Take 500 mg by mouth every 6 (six) hours as needed for mild pain or headache.      aspirin 81 MG chewable tablet Chew 81 mg by mouth daily.     calcitonin, salmon, (MIACALCIN/FORTICAL) 200 UNIT/ACT nasal spray Place 1 spray into alternate nostrils daily.     Cholecalciferol (VITAMIN D3) 25 MCG (1000 UT) CAPS Take 1,000 Units by mouth daily.     clotrimazole (MYCELEX) 10 MG troche Take 10 mg by mouth every 4 (four) hours as needed (oral thrush).     donepezil (ARICEPT) 10 MG tablet Take 10 mg by mouth at bedtime.     HYDROcodone-acetaminophen (NORCO/VICODIN) 5-325 MG tablet Take 1 tablet by mouth every 6 (six) hours as needed for moderate pain.     levothyroxine (SYNTHROID, LEVOTHROID) 50 MCG tablet Take 50 mcg by mouth daily before breakfast.     lidocaine (LIDODERM) 5 % Place 1 patch onto the skin daily. Apply to bilateral shoulder. Remove & Discard patch within 12 hours or as directed by MD     metoprolol tartrate (LOPRESSOR) 25 MG tablet Take 25 mg by mouth 2 (two) times daily.     mirtazapine (REMERON) 7.5 MG tablet Take 7.5 mg by mouth daily.     Multiple Vitamins-Minerals (CEROVITE SENIOR) TABS Take 1 tablet by mouth daily.     OVER THE COUNTER MEDICATION Take 120 mLs by mouth in the morning, at noon, and at bedtime. Med  Pass shake. May substitute health shakes if Med Pass unavailable.     simvastatin (ZOCOR) 20 MG tablet Take 20 mg by mouth at bedtime.       HOSPITAL MEDICATIONS  glycopyrrolate  0.4 mg Intravenous Q4H   mouth rinse  15 mL Mouth Rinse q12n4p    LABORATORY STUDIES CBC    Component Value Date/Time   WBC 25.4 (H) 07/02/2021 0539   RBC 3.50 (L) 07/02/2021 0539   HGB 11.9 (L) 07/02/2021 0539   HCT 34.5 (L) 07/02/2021 0539   HCT 31.2 (L) 01/26/2019 0935   PLT 476 (H) 07/02/2021 0539   MCV 98.6 07/02/2021 0539   MCH  34.0 07/02/2021 0539   MCHC 34.5 07/02/2021 0539   RDW 14.6 07/02/2021 0539   LYMPHSABS 1.1 06/24/2021 1914   MONOABS 0.6 06/24/2021 1914   EOSABS 0.1 06/24/2021 1914   BASOSABS 0.1 06/24/2021 1914   CMP    Component Value Date/Time   NA 131 (L) 07/02/2021 0539   NA 135 11/29/2017 1059   K 4.8 07/02/2021 0539   CL 95 (L) 07/02/2021 0539   CO2 27 07/02/2021 0539   GLUCOSE 207 (H) 07/02/2021 0539   BUN 24 (H) 07/02/2021 0539   BUN 13 11/29/2017 1059   CREATININE 0.56 07/02/2021 0539   CALCIUM 8.9 07/02/2021 0539   PROT 6.2 (L) 06/24/2021 1914   PROT 6.1 11/29/2017 1059   ALBUMIN 3.1 (L) 06/24/2021 1914   ALBUMIN 3.9 11/29/2017 1059   AST 39 06/24/2021 1914   ALT 20 06/24/2021 1914   ALKPHOS 57 06/24/2021 1914   BILITOT 0.8 06/24/2021 1914   BILITOT 0.3 11/29/2017 1059   GFRNONAA >60 07/02/2021 0539   GFRAA >60 09/25/2019 1038   COAGS Lab Results  Component Value Date   INR 1.3 (H) 06/24/2021   INR 1.2 09/25/2019   INR 1.29 02/16/2011   Lipid Panel    Component Value Date/Time   CHOL 75 06/25/2021 0538   TRIG 46 06/25/2021 0538   TRIG 50 06/25/2021 0538   HDL 49 06/25/2021 0538   CHOLHDL 1.5 06/25/2021 0538   VLDL 9 06/25/2021 0538   LDLCALC 17 06/25/2021 0538   HgbA1C  Lab Results  Component Value Date   HGBA1C 5.3 06/25/2021   Urinalysis    Component Value Date/Time   COLORURINE YELLOW 10/01/2020 0230   APPEARANCEUR HAZY (A)  10/01/2020 0230   LABSPEC 1.014 10/01/2020 0230   PHURINE 6.0 10/01/2020 0230   GLUCOSEU NEGATIVE 10/01/2020 0230   HGBUR NEGATIVE 10/01/2020 0230   BILIRUBINUR NEGATIVE 10/01/2020 0230   KETONESUR NEGATIVE 10/01/2020 0230   PROTEINUR NEGATIVE 10/01/2020 0230   UROBILINOGEN 0.2 01/14/2010 1406   NITRITE POSITIVE (A) 10/01/2020 0230   LEUKOCYTESUR TRACE (A) 10/01/2020 0230   Urine Drug Screen No results found for: LABOPIA, COCAINSCRNUR, LABBENZ, AMPHETMU, THCU, LABBARB  Alcohol Level    Component Value Date/Time   ETH <10 06/24/2021 1914     SIGNIFICANT DIAGNOSTIC STUDIES CT HEAD WO CONTRAST (5MM)  Result Date: 06/28/2021 CLINICAL DATA:  Stroke follow-up EXAM: CT HEAD WITHOUT CONTRAST TECHNIQUE: Contiguous axial images were obtained from the base of the skull through the vertex without intravenous contrast. COMPARISON:  Two days ago FINDINGS: Brain: Right MCA distribution infarcts scattered along the cortex and at the basal ganglia with petechial hemorrhage at the level of the basal ganglia. Extent is underestimated relative to prior MRI. Stable local swelling. No evidence of new infarction. Generalized brain atrophy. No hydrocephalus or collection Vascular: No hyperdense vessel or unexpected calcification. Skull: Negative Sinuses/Orbits: Negative IMPRESSION: Unchanged right MCA distribution infarcts with petechial hemorrhage at the basal ganglia. Electronically Signed   By: Jorje Guild M.D.   On: 06/28/2021 05:02   CT HEAD WO CONTRAST (5MM)  Result Date: 06/26/2021 CLINICAL DATA:  Stroke follow-up. Postop right MCA thrombectomy 06/24/2021 EXAM: CT HEAD WITHOUT CONTRAST TECHNIQUE: Contiguous axial images were obtained from the base of the skull through the vertex without intravenous contrast. COMPARISON:  CT head 06/24/2021.  MRI head 06/25/2021 FINDINGS: Brain: Acute infarct involving much of the right basal ganglia is best seen on diffusion-weighted imaging. There is mild local  mass-effect. There is mild  petechial hemorrhage in the right putamen, similar in appearance to the recent MRI but not present on the preprocedure CT. Generalized atrophy, most prominent in the temporal lobes bilaterally. Chronic microvascular ischemic changes throughout the white matter. Diffusion-weighted imaging demonstrated small areas of infarction in the right frontal and parietal cortex. CT hypodensity in the right inferior frontal lobe corresponds to this area. The right parietal infarct not well seen by CT. Vascular: Negative for hyperdense vessel Skull: Negative Sinuses/Orbits: Paranasal sinuses clear. Bilateral cataract extraction Other: None IMPRESSION: Acute right MCA infarct based on prior diffusion-weighted imaging Petechial hemorrhage in the right putamen compatible with mild hemorrhage involving infarct basal ganglia. This is unchanged from the MRI yesterday. Additional areas of acute infarct in the right frontal and parietal lobe best seen on diffusion-weighted imaging. Electronically Signed   By: Franchot Gallo M.D.   On: 06/26/2021 07:15   MR ANGIO HEAD WO CONTRAST  Result Date: 06/25/2021 CLINICAL DATA:  Stroke, follow-up. EXAM: MRI HEAD WITHOUT CONTRAST MRA HEAD WITHOUT CONTRAST TECHNIQUE: Multiplanar, multi-echo pulse sequences of the brain and surrounding structures were acquired without intravenous contrast. Angiographic images of the Circle of Willis were acquired using MRA technique without intravenous contrast. COMPARISON:  Report from mechanical thrombectomy 06/24/2021. Noncontrast head CT 06/24/2021, CT angiogram head/neck and CT perfusion 06/24/2021. brain MRI 01/26/2019. FINDINGS: MRI HEAD FINDINGS Brain: Cerebral volume is normal. Moderate generalized cerebral atrophy. Comparatively mild cerebellar atrophy. Acute infarct within the right basal ganglia involving much of the right caudate and lentiform nuclei. Superimposed petechial hemorrhage within the right basal ganglia  (Hiedelberg classifications, HI2). Mild local mass effect related to the infarction with subtle partial effacement of the right lateral ventricle. There are additional patchy small to moderate sized acute cortically-based infarcts within the right MCA vascular territory with involvement of the right frontal, parietal, posterior temporal and lateral occipital lobes. Notably, a small acute infarct involves the right motor strip. Additional punctate acute infarct within the right parietal lobe white matter. Punctate acute cortical infarct within the medial posterior left frontal lobe (precentral gyrus (series 5, image 95). Background mild multifocal T2/FLAIR hyperintensity within the cerebral white matter, nonspecific but compatible with chronic small vessel ischemic disease. Small chronic lacunar infarct within the right cerebellar hemisphere, new from the brain MRI of 01/26/2019 (series 10, image 5). No evidence of an intracranial mass. No extra-axial fluid collection. No midline shift. Vascular: Maintained flow voids within the proximal large arterial vessels. Skull and upper cervical spine: No focal suspicious marrow lesion. Incompletely assessed cervical spondylosis. Sinuses/Orbits: Visualized orbits show no acute finding. Bilateral lens replacements. No significant paranasal sinus disease. MRA HEAD FINDINGS Anterior circulation: The intracranial internal carotid arteries are patent. The M1 right middle cerebral artery is now patent. No right M2 proximal branch occlusion is identified. Atherosclerotic irregularity of the right M2 and more distal right MCA vessels. Most notably, there is a moderate stenosis within a superior division proximal M2 right MCA vessel (series 1053, image 16). The M1 left middle cerebral artery is patent. No left M2 proximal branch occlusion or high-grade proximal stenosis. The anterior cerebral arteries are patent. Redemonstrated hypoplastic left A1 segment. No intracranial aneurysm is  identified. Posterior circulation: Unchanged from the CTA head/neck of 06/24/2021, the left vertebral artery is occluded at the visualized distal cervical levels and at the proximal V4 level. There is enhancement within the mid and distal V4 left vertebral artery, likely due to retrograde flow. The V4 right vertebral artery is patent. The basilar artery is patent.  The posterior cerebral arteries are patent. Posterior communicating arteries are present bilaterally. Anatomic variants: As described. IMPRESSION: MRI brain: 1. Acute right MCA territory infarcts within the right cerebral hemisphere and right basal ganglia, as described. Superimposed petechial hemorrhage within the right basal ganglia (Hiedelberg classifications, HI2). Mild mass effect related to the right basal ganglia infarct with subtle partial effacement of the right lateral ventricle. No midline shift. 2. Punctate acute cortical infarct within the medial posterior left frontal lobe. 3. Background mild chronic small vessel ischemic changes within the cerebral white matter. 4. Small chronic lacunar infarct within right cerebellar hemisphere, a new finding as compared to the brain MRI of 01/26/2019. 5. Moderate generalized cerebral atrophy. Comparatively mild cerebellar atrophy. MRA head: 1. The M1 right middle cerebral artery remains patent status post mechanical thrombectomy performed 06/24/2021. 2. Atherosclerotic irregularity of the M2 and more distal right MCA vessels. Most notably, a moderate stenosis is present within a superior division proximal M2 right MCA vessel. 3. Enhancement within the mid-to-distal V4 left vertebral artery, likely due to retrograde flow (given unchanged apparent occlusion of this vessel more proximally). Electronically Signed   By: Kellie Simmering D.O.   On: 06/25/2021 11:01   MR BRAIN WO CONTRAST  Result Date: 06/25/2021 CLINICAL DATA:  Stroke, follow-up. EXAM: MRI HEAD WITHOUT CONTRAST MRA HEAD WITHOUT CONTRAST  TECHNIQUE: Multiplanar, multi-echo pulse sequences of the brain and surrounding structures were acquired without intravenous contrast. Angiographic images of the Circle of Willis were acquired using MRA technique without intravenous contrast. COMPARISON:  Report from mechanical thrombectomy 06/24/2021. Noncontrast head CT 06/24/2021, CT angiogram head/neck and CT perfusion 06/24/2021. brain MRI 01/26/2019. FINDINGS: MRI HEAD FINDINGS Brain: Cerebral volume is normal. Moderate generalized cerebral atrophy. Comparatively mild cerebellar atrophy. Acute infarct within the right basal ganglia involving much of the right caudate and lentiform nuclei. Superimposed petechial hemorrhage within the right basal ganglia (Hiedelberg classifications, HI2). Mild local mass effect related to the infarction with subtle partial effacement of the right lateral ventricle. There are additional patchy small to moderate sized acute cortically-based infarcts within the right MCA vascular territory with involvement of the right frontal, parietal, posterior temporal and lateral occipital lobes. Notably, a small acute infarct involves the right motor strip. Additional punctate acute infarct within the right parietal lobe white matter. Punctate acute cortical infarct within the medial posterior left frontal lobe (precentral gyrus (series 5, image 95). Background mild multifocal T2/FLAIR hyperintensity within the cerebral white matter, nonspecific but compatible with chronic small vessel ischemic disease. Small chronic lacunar infarct within the right cerebellar hemisphere, new from the brain MRI of 01/26/2019 (series 10, image 5). No evidence of an intracranial mass. No extra-axial fluid collection. No midline shift. Vascular: Maintained flow voids within the proximal large arterial vessels. Skull and upper cervical spine: No focal suspicious marrow lesion. Incompletely assessed cervical spondylosis. Sinuses/Orbits: Visualized orbits show no  acute finding. Bilateral lens replacements. No significant paranasal sinus disease. MRA HEAD FINDINGS Anterior circulation: The intracranial internal carotid arteries are patent. The M1 right middle cerebral artery is now patent. No right M2 proximal branch occlusion is identified. Atherosclerotic irregularity of the right M2 and more distal right MCA vessels. Most notably, there is a moderate stenosis within a superior division proximal M2 right MCA vessel (series 1053, image 16). The M1 left middle cerebral artery is patent. No left M2 proximal branch occlusion or high-grade proximal stenosis. The anterior cerebral arteries are patent. Redemonstrated hypoplastic left A1 segment. No intracranial aneurysm is identified. Posterior circulation:  Unchanged from the CTA head/neck of 06/24/2021, the left vertebral artery is occluded at the visualized distal cervical levels and at the proximal V4 level. There is enhancement within the mid and distal V4 left vertebral artery, likely due to retrograde flow. The V4 right vertebral artery is patent. The basilar artery is patent. The posterior cerebral arteries are patent. Posterior communicating arteries are present bilaterally. Anatomic variants: As described. IMPRESSION: MRI brain: 1. Acute right MCA territory infarcts within the right cerebral hemisphere and right basal ganglia, as described. Superimposed petechial hemorrhage within the right basal ganglia (Hiedelberg classifications, HI2). Mild mass effect related to the right basal ganglia infarct with subtle partial effacement of the right lateral ventricle. No midline shift. 2. Punctate acute cortical infarct within the medial posterior left frontal lobe. 3. Background mild chronic small vessel ischemic changes within the cerebral white matter. 4. Small chronic lacunar infarct within right cerebellar hemisphere, a new finding as compared to the brain MRI of 01/26/2019. 5. Moderate generalized cerebral atrophy.  Comparatively mild cerebellar atrophy. MRA head: 1. The M1 right middle cerebral artery remains patent status post mechanical thrombectomy performed 06/24/2021. 2. Atherosclerotic irregularity of the M2 and more distal right MCA vessels. Most notably, a moderate stenosis is present within a superior division proximal M2 right MCA vessel. 3. Enhancement within the mid-to-distal V4 left vertebral artery, likely due to retrograde flow (given unchanged apparent occlusion of this vessel more proximally). Electronically Signed   By: Kellie Simmering D.O.   On: 06/25/2021 11:01   IR CT Head Ltd  Result Date: 06/25/2021 INDICATION: 87 year old female presents with acute right MCA syndrome, for mechanical thrombectomy EXAM: ULTRASOUND-GUIDED ACCESS RIGHT COMMON FEMORAL ARTERY CERVICAL AND CEREBRAL ANGIOGRAM MECHANICAL THROMBECTOMY RIGHT MCA ANGIO-SEAL FOR HEMOSTASIS COMPARISON:  CT imaging of the same day MEDICATIONS: None ANESTHESIA/SEDATION: The anesthesia team was present to provide general endotracheal tube anesthesia and for patient monitoring during the procedure. Intubation was performed in neuro IR biplane room. Left radial arterial line was performed by the anesthesia team. Interventional neuro radiology nursing staff was also present. CONTRAST:  75 cc FLUOROSCOPY TIME:  Fluoroscopy Time: 20 minutes 24 seconds (1154 mGy). COMPLICATIONS: None TECHNIQUE: Informed written consent was obtained from the patient's family after a thorough discussion of the procedural risks, benefits and alternatives. Specific risks discussed include: Bleeding, infection, contrast reaction, kidney injury/failure, need for further procedure/surgery, arterial injury or dissection, embolization to new territory, intracranial hemorrhage (10-15% risk), neurologic deterioration, cardiopulmonary collapse, death. All questions were addressed. Maximal Sterile Barrier Technique was utilized including during the procedure including caps, mask, sterile  gowns, sterile gloves, sterile drape, hand hygiene and skin antiseptic. A timeout was performed prior to the initiation of the procedure. The anesthesia team was present to provide general endotracheal tube anesthesia and for patient monitoring during the procedure. Interventional neuro radiology nursing staff was also present. FINDINGS: Initial Findings: Right common carotid artery: Significant tortuosity of the common carotid artery at the base, and within the lower neck. No significant stenosis with mild atherosclerotic changes. Right external carotid artery: Patent with antegrade flow. Right internal carotid artery: Moderate tortuosity of the cervical ICA after the origin, with a single 90 degree angulated curvature just after the origin. Mixed calcified and soft plaque at the origin contributes to approximately 65% luminal narrowing by NASCET criteria. Right MCA: MCA is occluded at the origin, proximal M1 on the initial angiogram. 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 There  is a relative paucity of perfusion in the basal ganglia despite the presence of the collaterals. Right ACA: A 1 segment patent. A 2 segment perfuses the right territory. Leptomeningeal collaterals via the anterior cerebral artery Completion Findings: Right MCA: After the first pass of mechanical thrombectomy there is restoration of flow within the proximal segment of the M1. This restored flow into the inferior division, with restoration of flow of the temporal lobe. There remained a loss of 50% of the territory in the superior division, frontal and parietal regions after the first pass. After the second pass of mechanical thrombectomy, there was restoration of flow of both the inferior division and superior division, with complete reperfusion of the MCA territory. The perceived slow flow within the cortical vessels is secondary to occlusive balloon  guide within the cervical ICA, and the robust collateral perfusion, with the competing collateral flow of unopacified blood. TICI 3: Complete perfusion of the territory Flat panel CT performed in the room demonstrates no evidence of hemorrhage, midline shift, or mass effect. There is contrast staining within the basal ganglia of the right, compatible with early basal ganglia infarct. PROCEDURE: The anesthesia team was present to provide general endotracheal tube anesthesia and for patient monitoring during the procedure. Intubation was performed in negative pressure [place] in neuro IR holding. Interventional neuro radiology nursing staff was also present. Ultrasound survey of the right inguinal region was performed with images stored and sent to PACs. 11 blade scalpel was used to make a small incision. Blunt dissection was performed with US guidance. A micropuncture needle was used access the right common femoral artery under ultrasound. With excellent arterial blood flow returned, an .018 micro wire was passed through the needle, observed to enter the abdominal aorta under fluoroscopy. The needle was removed, and a micropuncture sheath was placed over the wire. The inner dilator and wire were removed, and an 035 wire was advanced under fluoroscopy into the abdominal aorta. The sheath was removed and a 25cm 62F straight vascular sheath was placed. The dilator was removed and the sheath was flushed. Sheath was attached to pressurized and heparinized saline bag for constant forward flow. JB 1 glide cath was advanced on the diagnostic catheter into the proximal descending thoracic aorta. Wire was removed and a double flush was performed. The catheter was then used to select the innominate artery, and a Glidewire navigated the catheter into the proximal cervical ICA. Wire was removed and angiogram was performed. Combination of the glide cath and the Glidewire were then used to navigate the catheter into the distal cervical  ICA. Wire was removed and a double flush was performed. Rose in wire was then passed through the glide catheter. A coaxial system was then advanced over the [employer] wire. This included a 95cm 087 "Walrus" balloon guide with coaxial 125cm Berenstein diagnostic catheter. This was advanced to the carotid bifurcation. A telescoping technique was then necessary to navigate the parents teen catheter and the balloon guide through the 65% stenosis of the carotid bifurcation, alternating push on the parents teen catheter and the balloon guide. Ultimately the coaxial system was advanced into the distal cervical ICA. Wire and catheter were gently removed with adequate flush at the hub of the balloon guide. Formal angiogram was performed. Road map function was used once the occluded vessel was identified. Copious back flush was performed and the balloon catheter was attached to heparinized and pressurized saline bag for forward flow. A second coaxial system was then advanced through the  balloon catheter, which included the selected intermediate catheter, microcatheter, and microwire. In this scenario, the set up included a 115cm CAT-5 intermediate catheter, a 150cm Trevo [id number] microcatheter, and 014 synchro soft wire. This system was advanced through the balloon guide catheter under the road-map function, with adequate back-flush at the rotating hemostatic valve at that back end of the balloon guide. Microcatheter and the intermediate catheter system were advanced through the terminal ICA and MCA to the level of the occlusion. The micro wire was then carefully advanced through the occluded segment with a loop configuration. Microcatheter was then manipulated through the occluded segment and the wire was removed with saline drip at the hub. Blood was then aspirated through the hub of the microcatheter, and a gentle contrast injection was performed confirming intraluminal position. A rotating hemostatic valve was then attached  to the back end of the microcatheter, and a pressurized and heparinized saline bag was attached to the catheter. 4 x 40 solitaire device was then selected. Back flush was achieved at the rotating hemostatic valve, and then the device was gently advanced through the microcatheter to the distal end. The retriever was then unsheathed by withdrawing the microcatheter under fluoroscopy. Once the retriever was completely unsheathed, the microcatheter was carefully stripped from the delivery device. CT 5 catheter aspiration was initiated and then the catheter was gently advanced on the stem of the retriever until cessation of flow was observed. A 3 minute time interval was observed. The balloon at the balloon guide catheter was not inflated, given there was proximal flow arrest secondary to the occlusive nature across the plaque at the carotid bifurcation. Constant aspiration using the proprietary engine was performed at the intermediate catheter, as the retriever was gently and slowly withdrawn with fluoroscopic observation. Once the retriever was "corked" within the tip of the intermediate catheter, both were removed from the system. Free aspiration was confirmed at the hub of the balloon guide catheter, with free blood return confirmed. Control angiogram was performed. Restoration of flow in the inferior division was confirmed. Superior division remained occluded. Roadmap angiogram was then performed. The tree Vo microcatheter and a synchro soft wire were then loaded coaxial within a zoom 55 aspiration catheter. This coaxial system was advanced through the balloon guide rotating hemostatic valve, advanced under roadmap angiogram to the division of the M1 into the superior and inferior division. Once the zoom catheter was at this division, the microcatheter microwire were removed. Aspiration was initiated on the zoom catheter, which was then advanced under fluoroscopic guidance into the occlusive M2 segment. Cessation of  flow was observed. Once the catheter had been advanced approximately 1-2 cm into the M2 segment, the catheter was gently removed. Spontaneous flow returned once the catheter was withdrawn into the carotid siphon. Zoom catheter was completely removed from the system, the balloon guide was aspirated, and a control angiogram was performed. Complete restoration of flow was confirmed. Balloon guide was then removed. The skin at the puncture site was then cleaned with Chlorhexidine. The 8 French sheath was removed and an 34F angioseal was deployed. Flat panel CT was performed. At this point, we attempted extubation of the patient. During the cessation of anesthesia, hemodynamic instability was encountered. We first observed a hypertensive episode with systolic blood pressures in 627-035 systolic range, tachycardia in the 120-150 range, and then subsequently hypotension, with a range of 70s-105 by the arterial pressure recording. Once the patient had been relatively stabilized, she was transported, intubated, to the [facility] [facility] ICU.  No complications were encountered. Estimated blood loss: 100 cc IMPRESSION: Status post ultrasound guided access right common femoral artery for right-sided cervical/cerebral angiogram and mechanical thrombectomy of proximal right M1 occlusion, achieving TICI 3 perfusion with 2 passes combination solitaire/aspiration, and direct aspiration. Angio-Seal deployed for hemostasis Signed, Dulcy Fanny. Dellia Nims, RPVI [facility] Radiology PLAN: The patient will remain intubated, given her hemodynamic instability upon attempted extubation ICU status Target systolic blood pressure of 120-140 Right hip straight time 6 hours Frequent neurovascular checks Repeat neurologic imaging with CT and/MRI at the discretion of neurology team Electronically Signed   By: Corrie Mckusick D.O.   On: 06/25/2021 08:35   IR US Guide Vasc Access Right  Result Date:  06/25/2021 INDICATION: 87 year old female presents with acute right MCA syndrome, for mechanical thrombectomy EXAM: ULTRASOUND-GUIDED ACCESS RIGHT COMMON FEMORAL ARTERY CERVICAL AND CEREBRAL ANGIOGRAM MECHANICAL THROMBECTOMY RIGHT MCA ANGIO-SEAL FOR HEMOSTASIS COMPARISON:  CT imaging of the same day MEDICATIONS: None ANESTHESIA/SEDATION: The anesthesia team was present to provide general endotracheal tube anesthesia and for patient monitoring during the procedure. Intubation was performed in neuro IR biplane room. Left radial arterial line was performed by the anesthesia team. Interventional neuro radiology nursing staff was also present. CONTRAST:  75 cc FLUOROSCOPY TIME:  Fluoroscopy Time: 20 minutes 24 seconds (1154 mGy). COMPLICATIONS: None TECHNIQUE: Informed written consent was obtained from the patient's family after a thorough discussion of the procedural risks, benefits and alternatives. Specific risks discussed include: Bleeding, infection, contrast reaction, kidney injury/failure, need for further procedure/surgery, arterial injury or dissection, embolization to new territory, intracranial hemorrhage (10-15% risk), neurologic deterioration, cardiopulmonary collapse, death. All questions were addressed. Maximal Sterile Barrier Technique was utilized including during the procedure including caps, mask, sterile gowns, sterile gloves, sterile drape, hand hygiene and skin antiseptic. A timeout was performed prior to the initiation of the procedure. The anesthesia team was present to provide general endotracheal tube anesthesia and for patient monitoring during the procedure. Interventional neuro radiology nursing staff was also present. FINDINGS: Initial Findings: Right common carotid artery: Significant tortuosity of the common carotid artery at the base, and within the lower neck. No significant stenosis with mild atherosclerotic changes. Right external carotid artery: Patent with antegrade flow. Right  internal carotid artery: Moderate tortuosity of the cervical ICA after the origin, with a single 90 degree angulated curvature just after the origin. Mixed calcified and soft plaque at the origin contributes to approximately 65% luminal narrowing by NASCET criteria. Right MCA: MCA is occluded at the origin, proximal M1 on the initial angiogram. 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 There is a relative paucity of perfusion in the basal ganglia despite the presence of the collaterals. Right ACA: A 1 segment patent. A 2 segment perfuses the right territory. Leptomeningeal collaterals via the anterior cerebral artery Completion Findings: Right MCA: After the first pass of mechanical thrombectomy there is restoration of flow within the proximal segment of the M1. This restored flow into the inferior division, with restoration of flow of the temporal lobe. There remained a loss of 50% of the territory in the superior division, frontal and parietal regions after the first pass. After the second pass of mechanical thrombectomy, there was restoration of flow of both the inferior division and superior division, with complete reperfusion of the MCA territory. The perceived slow flow within the cortical vessels is secondary to occlusive balloon guide within the cervical ICA, and  the robust collateral perfusion, with the competing collateral flow of unopacified blood. TICI 3: Complete perfusion of the territory Flat panel CT performed in the room demonstrates no evidence of hemorrhage, midline shift, or mass effect. There is contrast staining within the basal ganglia of the right, compatible with early basal ganglia infarct. PROCEDURE: The anesthesia team was present to provide general endotracheal tube anesthesia and for patient monitoring during the procedure. Intubation was performed in negative pressure [place] in neuro IR holding.  Interventional neuro radiology nursing staff was also present. Ultrasound survey of the right inguinal region was performed with images stored and sent to PACs. 11 blade scalpel was used to make a small incision. Blunt dissection was performed with US guidance. A micropuncture needle was used access the right common femoral artery under ultrasound. With excellent arterial blood flow returned, an .018 micro wire was passed through the needle, observed to enter the abdominal aorta under fluoroscopy. The needle was removed, and a micropuncture sheath was placed over the wire. The inner dilator and wire were removed, and an 035 wire was advanced under fluoroscopy into the abdominal aorta. The sheath was removed and a 25cm 91F straight vascular sheath was placed. The dilator was removed and the sheath was flushed. Sheath was attached to pressurized and heparinized saline bag for constant forward flow. JB 1 glide cath was advanced on the diagnostic catheter into the proximal descending thoracic aorta. Wire was removed and a double flush was performed. The catheter was then used to select the innominate artery, and a Glidewire navigated the catheter into the proximal cervical ICA. Wire was removed and angiogram was performed. Combination of the glide cath and the Glidewire were then used to navigate the catheter into the distal cervical ICA. Wire was removed and a double flush was performed. Rose in wire was then passed through the glide catheter. A coaxial system was then advanced over the [employer] wire. This included a 95cm 087 "Walrus" balloon guide with coaxial 125cm Berenstein diagnostic catheter. This was advanced to the carotid bifurcation. A telescoping technique was then necessary to navigate the parents teen catheter and the balloon guide through the 65% stenosis of the carotid bifurcation, alternating push on the parents teen catheter and the balloon guide. Ultimately the coaxial system was advanced into the  distal cervical ICA. Wire and catheter were gently removed with adequate flush at the hub of the balloon guide. Formal angiogram was performed. Road map function was used once the occluded vessel was identified. Copious back flush was performed and the balloon catheter was attached to heparinized and pressurized saline bag for forward flow. A second coaxial system was then advanced through the balloon catheter, which included the selected intermediate catheter, microcatheter, and microwire. In this scenario, the set up included a 115cm CAT-5 intermediate catheter, a 150cm Trevo [id number] microcatheter, and 014 synchro soft wire. This system was advanced through the balloon guide catheter under the road-map function, with adequate back-flush at the rotating hemostatic valve at that back end of the balloon guide. Microcatheter and the intermediate catheter system were advanced through the terminal ICA and MCA to the level of the occlusion. The micro wire was then carefully advanced through the occluded segment with a loop configuration. Microcatheter was then manipulated through the occluded segment and the wire was removed with saline drip at the hub. Blood was then aspirated through the hub of the microcatheter, and a gentle contrast injection was performed confirming intraluminal position. A rotating hemostatic valve was  then attached to the back end of the microcatheter, and a pressurized and heparinized saline bag was attached to the catheter. 4 x 40 solitaire device was then selected. Back flush was achieved at the rotating hemostatic valve, and then the device was gently advanced through the microcatheter to the distal end. The retriever was then unsheathed by withdrawing the microcatheter under fluoroscopy. Once the retriever was completely unsheathed, the microcatheter was carefully stripped from the delivery device. CT 5 catheter aspiration was initiated and then the catheter was gently advanced on the stem of  the retriever until cessation of flow was observed. A 3 minute time interval was observed. The balloon at the balloon guide catheter was not inflated, given there was proximal flow arrest secondary to the occlusive nature across the plaque at the carotid bifurcation. Constant aspiration using the proprietary engine was performed at the intermediate catheter, as the retriever was gently and slowly withdrawn with fluoroscopic observation. Once the retriever was "corked" within the tip of the intermediate catheter, both were removed from the system. Free aspiration was confirmed at the hub of the balloon guide catheter, with free blood return confirmed. Control angiogram was performed. Restoration of flow in the inferior division was confirmed. Superior division remained occluded. Roadmap angiogram was then performed. The tree Vo microcatheter and a synchro soft wire were then loaded coaxial within a zoom 55 aspiration catheter. This coaxial system was advanced through the balloon guide rotating hemostatic valve, advanced under roadmap angiogram to the division of the M1 into the superior and inferior division. Once the zoom catheter was at this division, the microcatheter microwire were removed. Aspiration was initiated on the zoom catheter, which was then advanced under fluoroscopic guidance into the occlusive M2 segment. Cessation of flow was observed. Once the catheter had been advanced approximately 1-2 cm into the M2 segment, the catheter was gently removed. Spontaneous flow returned once the catheter was withdrawn into the carotid siphon. Zoom catheter was completely removed from the system, the balloon guide was aspirated, and a control angiogram was performed. Complete restoration of flow was confirmed. Balloon guide was then removed. The skin at the puncture site was then cleaned with Chlorhexidine. The 8 French sheath was removed and an 57F angioseal was deployed. Flat panel CT was performed. At this point,  we attempted extubation of the patient. During the cessation of anesthesia, hemodynamic instability was encountered. We first observed a hypertensive episode with systolic blood pressures in 761-607 systolic range, tachycardia in the 120-150 range, and then subsequently hypotension, with a range of 70s-105 by the arterial pressure recording. Once the patient had been relatively stabilized, she was transported, intubated, to the [place] ICU. No complications were encountered. Estimated blood loss: 100 cc IMPRESSION: Status post ultrasound guided access right common femoral artery for right-sided cervical/cerebral angiogram and mechanical thrombectomy of proximal right M1 occlusion, achieving TICI 3 perfusion with 2 passes combination solitaire/aspiration, and direct aspiration. Angio-Seal deployed for hemostasis Signed, Dulcy Fanny. Dellia Nims, RPVI [facility] Radiology PLAN: The patient will remain intubated, given her hemodynamic instability upon attempted extubation ICU status Target systolic blood pressure of 120-140 Right hip straight time 6 hours Frequent neurovascular checks Repeat neurologic imaging with CT and/MRI at the discretion of neurology team Electronically Signed   By: Corrie Mckusick D.O.   On: 06/25/2021 08:35   CT C-SPINE NO CHARGE  Result Date: 06/24/2021 CLINICAL DATA:  Neck trauma. EXAM: CT CERVICAL SPINE WITHOUT CONTRAST TECHNIQUE: Multidetector CT imaging  of the cervical spine was performed without intravenous contrast. Multiplanar CT image reconstructions were also generated. COMPARISON:  Cervical spine CT 01/25/2019. CTA head and neck 06/24/2021. FINDINGS: Alignment: There is levoconvex curvature of the cervical spine. There is 3 mm of anterolisthesis at C7-T1 which is favored as degenerative. Alignment is otherwise anatomic. This is unchanged from the prior examination. Skull base and vertebrae: No acute fracture. No primary bone lesion or  focal pathologic process. The bones are diffusely osteopenic. Soft tissues and spinal canal: No prevertebral fluid or swelling. No visible canal hematoma. Please see CTA angiogram head and neck performed same day for further description vascularity/arterial structures. Disc levels: There is disc space narrowing, endplate osteophyte formation and sclerosis from C3 through C7 compatible with degenerative change. Multilevel neural foraminal stenosis, left greater than right, appears unchanged secondary to uncovertebral spurring. There is no severe central canal stenosis at any level. Upper chest: There is scarring in both lung apices. Other: None. IMPRESSION: No acute fracture or traumatic malalignment of the cervical spine. Electronically Signed   By: Ronney Asters M.D.   On: 06/24/2021 20:47   DG CHEST PORT 1 VIEW  Result Date: 07/10/2021 CLINICAL DATA:  87 year old female with wheezing. Shortness of breath. Chronic pulmonary interstitial disease. Code stroke presentation. EXAM: PORTABLE CHEST 1 VIEW COMPARISON:  Portable chest 06/24/2021 and earlier. FINDINGS: Portable AP upright view at 1120 hours. Extubated. Enteric feeding tube now in place, courses to the abdomen with tip not included. Increased gaseous distension of the stomach, at least moderate. Lower lung volumes. Chronic pulmonary interstitial disease with bilateral peripheral and basilar reticular opacity. No superimposed pneumothorax, pulmonary edema, pleural effusion or consolidation identified. Stable cardiac size and mediastinal contours. No acute osseous abnormality identified. Advanced chronic changes at the right shoulder. IMPRESSION: 1. Extubated. Enteric lower lung volumes. 2. Feeding tube placed and courses to the abdomen with tip not included. The stomach is at least moderately distended. Consider air swallowing. 3. Chronic pulmonary interstitial disease. No superimposed acute pulmonary finding identified. Electronically Signed   By: Genevie Ann  M.D.   On: 07/10/2021 11:53   DG CHEST PORT 1 VIEW  Result Date: 06/24/2021 CLINICAL DATA:  Respiratory distress EXAM: PORTABLE CHEST 1 VIEW COMPARISON:  10/01/2020 FINDINGS: Endotracheal tube tip is at the level of the clavicular heads. There is right hilar prominence, likely an enlarged pulmonary artery. No focal airspace consolidation or pulmonary edema. No sizable pleural effusion. IMPRESSION: 1. Endotracheal tube tip at the level of the clavicular heads. 2. Right hilar prominence, likely enlarged pulmonary artery. Electronically Signed   By: Ulyses Jarred M.D.   On: 06/24/2021 23:10   DG Abd Portable 1V  Result Date: 06/27/2021 CLINICAL DATA:  Feeding tube placement. EXAM: PORTABLE ABDOMEN - 1 VIEW COMPARISON:  None. FINDINGS: The bowel gas pattern is normal. Distal tip of feeding tube is seen in expected position of distal stomach or proximal duodenum. No radio-opaque calculi or other significant radiographic abnormality are seen. IMPRESSION: Distal tip of feeding tube seen in expected position of distal stomach or proximal duodenum. Electronically Signed   By: Marijo Conception M.D.   On: 06/27/2021 11:47   EEG adult  Result Date: 06/26/2021 Lora Havens, MD     06/26/2021  2:39 PM Patient Name: Dominique [name] MRN: [id number] Epilepsy Attending: Lora Havens Referring Physician/Provider: Dr Antony Contras Date: 06/26/2021 Duration: 22.46 mins Patient history: 87 year old female with right MCA infarct.  EEG to evaluate for seizures. Level of alertness:  Awake AEDs during EEG study: None Technical aspects: This EEG study was done with scalp electrodes positioned according to the 10-20 International system of electrode placement. Electrical activity was acquired at a sampling rate of 500Hz  and reviewed with a high frequency filter of 70Hz  and a low frequency filter of 1Hz . EEG data were recorded continuously and digitally stored. Description: No clear posterior dominant rhythm was seen. EEG showed  continuous generalized and lateralized right hemisphere 3 to 5 Hz theta-delta slowing which at times appeared sharply contoured and rhythmic in the right hemisphere. Hyperventilation and photic stimulation were not performed.   ABNORMALITY - Continuous slow, generalized and lateralized right hemisphere IMPRESSION: This study is suggestive of focal dysfunction in right hemisphere likely secondary to underlying stroke.  Additionally there is moderate diffuse encephalopathy, nonspecific etiology.  No seizures or definite epileptiform discharges were seen throughout the recording. If suspicion for ictal- interictal activity remains a concern, a prolonged study can be considered. Lora Havens   ECHOCARDIOGRAM COMPLETE  Result Date: 06/25/2021    ECHOCARDIOGRAM REPORT   Patient Name:   Dominique [name] Liaw Date of Exam: 06/25/2021 Medical Rec #:  [id number]     Height:       59.5 in Accession #:    [id number]    Weight:       100.0 lb Date of Birth:  [birth year]    BSA:          1.382 m Patient Age:    50 years      BP:           179/80 mmHg Patient Gender: F             HR:           54 bpm. Exam Location:  Inpatient Procedure: 3D Echo, 2D Echo, Cardiac Doppler and Color Doppler Indications:    Stroke  History:        Patient has prior history of Echocardiogram examinations, most                 recent 09/30/2017. Abnormal ECG, Stroke, Arrythmias:Atrial                 Fibrillation; Signs/Symptoms:Dyspnea, Shortness of Breath,                 Altered Mental Status, Alzheimer's and                 Dizziness/Lightheadedness.  Sonographer:    Roseanna Rainbow RDCS Referring Phys: [id number] [place]  Sonographer Comments: Echo performed with patient supine and on artificial respirator. IMPRESSIONS  1. Moderate hyeprtrophy of the basal septum with otherwise mild concentric LVH. Left ventricular ejection fraction, by estimation, is 60 to 65%. The left ventricle has normal function. The left ventricle has no regional wall motion  abnormalities. There is moderate left ventricular hypertrophy. Left ventricular diastolic parameters are indeterminate. Elevated left ventricular end-diastolic pressure.  2. Right ventricular systolic function is normal. The right ventricular size is normal. There is mildly elevated pulmonary artery systolic pressure.  3. The mitral valve is normal in structure. Trivial mitral valve regurgitation. No evidence of mitral stenosis. Moderate mitral annular calcification.  4. Tricuspid valve regurgitation is mild to moderate.  5. The aortic valve is calcified. There is severe calcifcation of the aortic valve. There is severe thickening of the aortic valve. Aortic valve regurgitation is mild. Mild to moderate aortic valve sclerosis/calcification is present, without any evidence of aortic stenosis.  6.  The inferior vena cava is normal in size with <50% respiratory variability, suggesting right atrial pressure of 8 mmHg. Comparison(s): Echo was compared side by side with the echo from 09/2017. There is now severe thickening and calcification of the aortic valve. There is also an echodense (1.0 x 1.0 cm) partially mobile density in the LVOT between the anterior mitral valve leaflet and the aortic valve that was not present in 2018. Recommend TEE to better evaluate. FINDINGS  Left Ventricle: Moderate hyeprtrophy of the basal septum with otherwise mild concentric LVH. Left ventricular ejection fraction, by estimation, is 60 to 65%. The left ventricle has normal function. The left ventricle has no regional wall motion abnormalities. The left ventricular internal cavity size was normal in size. There is moderate left ventricular hypertrophy. Left ventricular diastolic parameters are indeterminate. Elevated left ventricular end-diastolic pressure. Right Ventricle: The right ventricular size is normal. No increase in right ventricular wall thickness. Right ventricular systolic function is normal. There is mildly elevated pulmonary  artery systolic pressure. The tricuspid regurgitant velocity is 2.84  m/s, and with an assumed right atrial pressure of 8 mmHg, the estimated right ventricular systolic pressure is 41.7 mmHg. Left Atrium: Left atrial size was normal in size. Right Atrium: Right atrial size was normal in size. Pericardium: There is no evidence of pericardial effusion. Mitral Valve: The mitral valve is normal in structure. Moderate mitral annular calcification. Trivial mitral valve regurgitation. No evidence of mitral valve stenosis. MV peak gradient, 6.0 mmHg. The mean mitral valve gradient is 1.0 mmHg. Tricuspid Valve: The tricuspid valve is normal in structure. Tricuspid valve regurgitation is mild to moderate. No evidence of tricuspid stenosis. Aortic Valve: The aortic valve is calcified. There is severe calcifcation of the aortic valve. There is severe thickening of the aortic valve. Aortic valve regurgitation is mild. Mild to moderate aortic valve sclerosis/calcification is present, without any evidence of aortic stenosis. Aortic valve mean gradient measures 7.7 mmHg. Aortic valve peak gradient measures 13.5 mmHg. Aortic valve area, by VTI measures 1.33 cm. Pulmonic Valve: The pulmonic valve was normal in structure. Pulmonic valve regurgitation is not visualized. No evidence of pulmonic stenosis. Aorta: The aortic root is normal in size and structure. Venous: The inferior vena cava is normal in size with less than 50% respiratory variability, suggesting right atrial pressure of 8 mmHg. IAS/Shunts: No atrial level shunt detected by color flow Doppler.  LEFT VENTRICLE PLAX 2D LVIDd:         3.10 cm     Diastology LVIDs:         2.00 cm     LV e' medial:    4.61 cm/s LV PW:         1.10 cm     LV E/e' medial:  23.9 LV IVS:        1.40 cm     LV e' lateral:   5.68 cm/s LVOT diam:     2.00 cm     LV E/e' lateral: 19.4 LV SV:         56 LV SV Index:   40 LVOT Area:     3.14 cm                             3D Volume EF: LV Volumes  (MOD)           3D EF:        55 % LV vol d, MOD A2C:  46.2 ml LV EDV:       66 ml LV vol d, MOD A4C: 36.8 ml LV ESV:       30 ml LV vol s, MOD A2C: 13.1 ml LV SV:        37 ml LV vol s, MOD A4C: 15.6 ml LV SV MOD A2C:     33.1 ml LV SV MOD A4C:     36.8 ml LV SV MOD BP:      28.9 ml RIGHT VENTRICLE            IVC RV S prime:     9.23 cm/s  IVC diam: 1.60 cm TAPSE (M-mode): 1.2 cm LEFT ATRIUM           Index       RIGHT ATRIUM           Index LA diam:      3.70 cm 2.68 cm/m  RA Area:     14.00 cm LA Vol (A2C): 35.8 ml 25.91 ml/m RA Volume:   34.20 ml  24.75 ml/m LA Vol (A4C): 76.0 ml 55.00 ml/m  AORTIC VALVE                    PULMONIC VALVE AV Area (Vmax):    1.24 cm     PR End Diast Vel: 1.99 msec AV Area (Vmean):   1.31 cm AV Area (VTI):     1.33 cm AV Vmax:           183.67 cm/s AV Vmean:          128.000 cm/s AV VTI:            0.421 m AV Peak Grad:      13.5 mmHg AV Mean Grad:      7.7 mmHg LVOT Vmax:         72.50 cm/s LVOT Vmean:        53.400 cm/s LVOT VTI:          0.178 m LVOT/AV VTI ratio: 0.42  AORTA Ao Root diam: 3.40 cm Ao Asc diam:  3.10 cm MITRAL VALVE                TRICUSPID VALVE MV Area (PHT): 4.39 cm     TR Peak grad:   32.3 mmHg MV Area VTI:   1.54 cm     TR Vmax:        284.00 cm/s MV Peak grad:  6.0 mmHg MV Mean grad:  1.0 mmHg     SHUNTS MV Vmax:       1.22 m/s     Systemic VTI:  0.18 m MV Vmean:      50.9 cm/s    Systemic Diam: 2.00 cm MV Decel Time: 173 msec MV E velocity: 110.00 cm/s MV A velocity: 38.10 cm/s MV E/A ratio:  2.89 Skeet Latch MD Electronically signed by Skeet Latch MD Signature Date/Time: 06/25/2021/3:42:19 PM    Final    IR PERCUTANEOUS ART THROMBECTOMY/INFUSION INTRACRANIAL INC DIAG ANGIO  Result Date: 06/25/2021 INDICATION: 87 year old female presents with acute right MCA syndrome, for mechanical thrombectomy EXAM: ULTRASOUND-GUIDED ACCESS RIGHT COMMON FEMORAL ARTERY CERVICAL AND CEREBRAL ANGIOGRAM MECHANICAL THROMBECTOMY RIGHT MCA ANGIO-SEAL FOR  HEMOSTASIS COMPARISON:  CT imaging of the same day MEDICATIONS: None ANESTHESIA/SEDATION: The anesthesia team was present to provide general endotracheal tube anesthesia and for patient monitoring during the procedure. Intubation was performed in neuro IR biplane room. Left radial arterial line was performed  by the anesthesia team. Interventional neuro radiology nursing staff was also present. CONTRAST:  75 cc FLUOROSCOPY TIME:  Fluoroscopy Time: 20 minutes 24 seconds (1154 mGy). COMPLICATIONS: None TECHNIQUE: Informed written consent was obtained from the patient's family after a thorough discussion of the procedural risks, benefits and alternatives. Specific risks discussed include: Bleeding, infection, contrast reaction, kidney injury/failure, need for further procedure/surgery, arterial injury or dissection, embolization to new territory, intracranial hemorrhage (10-15% risk), neurologic deterioration, cardiopulmonary collapse, death. All questions were addressed. Maximal Sterile Barrier Technique was utilized including during the procedure including caps, mask, sterile gowns, sterile gloves, sterile drape, hand hygiene and skin antiseptic. A timeout was performed prior to the initiation of the procedure. The anesthesia team was present to provide general endotracheal tube anesthesia and for patient monitoring during the procedure. Interventional neuro radiology nursing staff was also present. FINDINGS: Initial Findings: Right common carotid artery: Significant tortuosity of the common carotid artery at the base, and within the lower neck. No significant stenosis with mild atherosclerotic changes. Right external carotid artery: Patent with antegrade flow. Right internal carotid artery: Moderate tortuosity of the cervical ICA after the origin, with a single 90 degree angulated curvature just after the origin. Mixed calcified and soft plaque at the origin contributes to approximately 65% luminal narrowing by  NASCET criteria. Right MCA: MCA is occluded at the origin, proximal M1 on the initial angiogram. 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 There is a relative paucity of perfusion in the basal ganglia despite the presence of the collaterals. Right ACA: A 1 segment patent. A 2 segment perfuses the right territory. Leptomeningeal collaterals via the anterior cerebral artery Completion Findings: Right MCA: After the first pass of mechanical thrombectomy there is restoration of flow within the proximal segment of the M1. This restored flow into the inferior division, with restoration of flow of the temporal lobe. There remained a loss of 50% of the territory in the superior division, frontal and parietal regions after the first pass. After the second pass of mechanical thrombectomy, there was restoration of flow of both the inferior division and superior division, with complete reperfusion of the MCA territory. The perceived slow flow within the cortical vessels is secondary to occlusive balloon guide within the cervical ICA, and the robust collateral perfusion, with the competing collateral flow of unopacified blood. TICI 3: Complete perfusion of the territory Flat panel CT performed in the room demonstrates no evidence of hemorrhage, midline shift, or mass effect. There is contrast staining within the basal ganglia of the right, compatible with early basal ganglia infarct. PROCEDURE: The anesthesia team was present to provide general endotracheal tube anesthesia and for patient monitoring during the procedure. Intubation was performed in negative pressure [place] in neuro IR holding. Interventional neuro radiology nursing staff was also present. Ultrasound survey of the right inguinal region was performed with images stored and sent to PACs. 11 blade scalpel was used to make a small incision. Blunt dissection was performed with US  guidance. A micropuncture needle was used access the right common femoral artery under ultrasound. With excellent arterial blood flow returned, an .018 micro wire was passed through the needle, observed to enter the abdominal aorta under fluoroscopy. The needle was removed, and a micropuncture sheath was placed over the wire. The inner dilator and wire were removed, and an 035 wire was advanced under fluoroscopy into the abdominal aorta. The sheath was removed and a 25cm 61F straight  vascular sheath was placed. The dilator was removed and the sheath was flushed. Sheath was attached to pressurized and heparinized saline bag for constant forward flow. JB 1 glide cath was advanced on the diagnostic catheter into the proximal descending thoracic aorta. Wire was removed and a double flush was performed. The catheter was then used to select the innominate artery, and a Glidewire navigated the catheter into the proximal cervical ICA. Wire was removed and angiogram was performed. Combination of the glide cath and the Glidewire were then used to navigate the catheter into the distal cervical ICA. Wire was removed and a double flush was performed. Rose in wire was then passed through the glide catheter. A coaxial system was then advanced over the [employer] wire. This included a 95cm 087 "Walrus" balloon guide with coaxial 125cm Berenstein diagnostic catheter. This was advanced to the carotid bifurcation. A telescoping technique was then necessary to navigate the parents teen catheter and the balloon guide through the 65% stenosis of the carotid bifurcation, alternating push on the parents teen catheter and the balloon guide. Ultimately the coaxial system was advanced into the distal cervical ICA. Wire and catheter were gently removed with adequate flush at the hub of the balloon guide. Formal angiogram was performed. Road map function was used once the occluded vessel was identified. Copious back flush was performed and the  balloon catheter was attached to heparinized and pressurized saline bag for forward flow. A second coaxial system was then advanced through the balloon catheter, which included the selected intermediate catheter, microcatheter, and microwire. In this scenario, the set up included a 115cm CAT-5 intermediate catheter, a 150cm Trevo [id number] microcatheter, and 014 synchro soft wire. This system was advanced through the balloon guide catheter under the road-map function, with adequate back-flush at the rotating hemostatic valve at that back end of the balloon guide. Microcatheter and the intermediate catheter system were advanced through the terminal ICA and MCA to the level of the occlusion. The micro wire was then carefully advanced through the occluded segment with a loop configuration. Microcatheter was then manipulated through the occluded segment and the wire was removed with saline drip at the hub. Blood was then aspirated through the hub of the microcatheter, and a gentle contrast injection was performed confirming intraluminal position. A rotating hemostatic valve was then attached to the back end of the microcatheter, and a pressurized and heparinized saline bag was attached to the catheter. 4 x 40 solitaire device was then selected. Back flush was achieved at the rotating hemostatic valve, and then the device was gently advanced through the microcatheter to the distal end. The retriever was then unsheathed by withdrawing the microcatheter under fluoroscopy. Once the retriever was completely unsheathed, the microcatheter was carefully stripped from the delivery device. CT 5 catheter aspiration was initiated and then the catheter was gently advanced on the stem of the retriever until cessation of flow was observed. A 3 minute time interval was observed. The balloon at the balloon guide catheter was not inflated, given there was proximal flow arrest secondary to the occlusive nature across the plaque at the  carotid bifurcation. Constant aspiration using the proprietary engine was performed at the intermediate catheter, as the retriever was gently and slowly withdrawn with fluoroscopic observation. Once the retriever was "corked" within the tip of the intermediate catheter, both were removed from the system. Free aspiration was confirmed at the hub of the balloon guide catheter, with free blood return confirmed. Control angiogram was performed. Restoration  of flow in the inferior division was confirmed. Superior division remained occluded. Roadmap angiogram was then performed. The tree Vo microcatheter and a synchro soft wire were then loaded coaxial within a zoom 55 aspiration catheter. This coaxial system was advanced through the balloon guide rotating hemostatic valve, advanced under roadmap angiogram to the division of the M1 into the superior and inferior division. Once the zoom catheter was at this division, the microcatheter microwire were removed. Aspiration was initiated on the zoom catheter, which was then advanced under fluoroscopic guidance into the occlusive M2 segment. Cessation of flow was observed. Once the catheter had been advanced approximately 1-2 cm into the M2 segment, the catheter was gently removed. Spontaneous flow returned once the catheter was withdrawn into the carotid siphon. Zoom catheter was completely removed from the system, the balloon guide was aspirated, and a control angiogram was performed. Complete restoration of flow was confirmed. Balloon guide was then removed. The skin at the puncture site was then cleaned with Chlorhexidine. The 8 French sheath was removed and an 64F angioseal was deployed. Flat panel CT was performed. At this point, we attempted extubation of the patient. During the cessation of anesthesia, hemodynamic instability was encountered. We first observed a hypertensive episode with systolic blood pressures in 678-938 systolic range, tachycardia in the 120-150 range,  and then subsequently hypotension, with a range of 70s-105 by the arterial pressure recording. Once the patient had been relatively stabilized, she was transported, intubated, to the [facility] ICU. No complications were encountered. Estimated blood loss: 100 cc IMPRESSION: Status post ultrasound guided access right common femoral artery for right-sided cervical/cerebral angiogram and mechanical thrombectomy of proximal right M1 occlusion, achieving TICI 3 perfusion with 2 passes combination solitaire/aspiration, and direct aspiration. Angio-Seal deployed for hemostasis Signed, Dulcy Fanny. Dellia Nims, RPVI [facility] Radiology PLAN: The patient will remain intubated, given her hemodynamic instability upon attempted extubation ICU status Target systolic blood pressure of 120-140 Right hip straight time 6 hours Frequent neurovascular checks Repeat neurologic imaging with CT and/MRI at the discretion of neurology team Electronically Signed   By: Corrie Mckusick D.O.   On: 06/25/2021 08:35   CT HEAD CODE STROKE WO CONTRAST  Result Date: 06/24/2021 CLINICAL DATA:  Code stroke.  Neuro deficit, acute, stroke suspected EXAM: CT HEAD WITHOUT CONTRAST TECHNIQUE: Contiguous axial images were obtained from the base of the skull through the vertex without intravenous contrast. COMPARISON:  CT head 10/01/2020. FINDINGS: Brain: No evidence of acute large vascular territory infarction, hemorrhage, hydrocephalus, extra-axial collection or mass lesion/mass effect. Mild patchy white matter hypodensities, nonspecific but compatible with chronic microvascular ischemic disease. Chronic basal ganglia mineralization. Vascular: No hyperdense vessel identified. Skull: No acute fracture. Sinuses/Orbits: Clear sinuses.  No acute orbital finding. Other: No mastoid effusions. ASPECTS [facility] Stroke Program Early CT Score) Total score (0-10 with 10 being normal): 10. IMPRESSION: 1. No evidence of  acute large vascular territory infarct or acute hemorrhage. 2. ASPECTS is 10. Code stroke imaging results were communicated on 06/24/2021 at 7:27 pm to provider Dr. Curly Shores Via secure text paging. Electronically Signed   By: Margaretha Sheffield M.D.   On: 06/24/2021 19:28   CT ANGIO HEAD NECK W WO CM W PERF (CODE STROKE)  Result Date: 06/24/2021 CLINICAL DATA:  Neuro deficit, acute, stroke suspected EXAM: CT ANGIOGRAPHY HEAD AND NECK CT PERFUSION BRAIN TECHNIQUE: Multidetector CT imaging of the head and neck was performed using the standard protocol during bolus administration of intravenous  contrast. Multiplanar CT image reconstructions and MIPs were obtained to evaluate the vascular anatomy. Carotid stenosis measurements (when applicable) are obtained utilizing NASCET criteria, using the distal internal carotid diameter as the denominator. Multiphase CT imaging of the brain was performed following IV bolus contrast injection. Subsequent parametric perfusion maps were calculated using RAPID software. CONTRAST:  19mL OMNIPAQUE IOHEXOL 350 MG/ML SOLN COMPARISON:  None. FINDINGS: CTA NECK FINDINGS Aortic arch: Vessel origins are patent. Right carotid system: Mixed calcific and noncalcific atherosclerosis of the carotid bifurcation with approximately 60-70% stenosis of the proximal ICA. Left carotid system: Calcific and noncalcific atherosclerosis at the carotid bifurcation with resulting severe stenosis of the proximal ICA, difficult to quantify but likely greater than 80%. Vertebral arteries: Occlusion of the left vertebral artery at its origin with non opacification of the neck. Right vertebral artery is patent without significant (greater than 50%) stenosis. Skeleton: Moderate to severe multilevel degenerative change in the cervical spine. Other neck: No acute abnormality. Upper chest: Small to moderate bilateral layering pleural effusions. Partially imaged fibrotic change, better characterized on prior CT chest  from 2019. Debris within the trachea, placing the patient at risk for aspiration. Review of the MIP images confirms the above findings CTA HEAD FINDINGS Anterior circulation: Bilateral intracranial ICAs are patent without significant stenosis. Abrupt occlusion of the proximal right M1 MCA. There are some opacified distal right MCA branches, asymmetrically diminished compared to the left. Right A1 ACA is patent. Left M1 MCA and proximal left anterior MCA branches are patent. Small left A1 ACA, poorly opacified proximally. Bilateral A2 ACAS are patent without significant stenosis. No aneurysm identified. Posterior circulation: Reconstitution of the left intradural vertebral artery. Right vertebral artery and basilar artery are patent without hemodynamically significant stenosis. Somewhat dysplastic appearance of the basilar artery without discrete aneurysm. Bilateral posterior communicating arteries. Bilateral posterior cerebral arteries are patent without proximal hemodynamically significant stenosis. Venous sinuses: Nondiagnostic evaluation due to arterial timing. Anatomic variants: See above. Review of the MIP images confirms the above findings CT Brain Perfusion Findings: ASPECTS: 10 CBF (<30%) Volume: 46mL Perfusion (Tmax>6.0s) volume: 52mL Mismatch Volume: 70mL Infarction Location:None IMPRESSION: 1. Occlusion of the proximal right M1 MCA. 2. Large area of right MCA territory penumbra (79 mL) without evidence of core infarct. 3. 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 4. Severe left (difficult to quantify but likely greater than 80%) and approximately 70% right proximal ICA stenosis in the neck due to atherosclerosis. 5. Small left A1 ACA, poorly opacified proximally. 6. Small to moderate bilateral pleural effusions. 7. Debris within the trachea, placing the patient at risk for aspiration. 8. Partially visualized fibrotic change in the lung  apices, better characterized on prior CT chest from 2019. Critical findings were communicated on 06/24/2021 at 8:00 pm to provider Dr. Maurine Minister Via telephone, who verbally acknowledged these results. Electronically Signed   By: Margaretha Sheffield M.D.   On: 06/24/2021 20:20       HISTORY OF PRESENT ILLNESS (From Dr Lyn Records H&P on 06/24/21) Dominique [name] is a 86 y.o. female with a past medical history significant for atrial fibrillation not on anticoagulation due to frequent falls, baseline dementia, hypertension, hypothyroidism, uterine cancer, and prior thrombocytopenia resolved more recently. History is primarily provided by the son and EMS as well as chart review. Son notes that she was seen by her regular nurse practitioner the day before at which time she was essentially at her baseline that she continues  to struggle with significant right upper extremity pain for which some cortisone and muscle relaxer at a low dose was recommended.  There are varying reports of when she was last seen well today and what her baseline is from EMS, with a possible last known well of 1:30 or 3:30 PM and discovery of the patient down with left-sided weakness at 6:15 PM with a significant laceration on the left upper extremity.  EMS was concerned about twitching/tremoring movements of the right upper extremity and route and did give her 2.5 mg of Versed which alleviated these movements.  Her blood pressures for them were 140s over 100s with heart rates in the 60s to 70s.  She did speak a few words to EMS initially such as saying no when she was asked if she was in pain, but then became nonverbal At baseline the patient is continent and walks with a walker though she does have frequent falls for which reason she has been taken off of her anticoagulation.  He does not think she would be oriented to month or year at baseline and she is typically not oriented to place either, although she is otherwise conversant and is able to feed  herself.  He denies any recent medical concerns or new medications other than the cortisone/muscle relaxer previously mentioned and notes she does take aspirin. On chart review she had multiple pelvic fractures and right humeral fracture in December 2020 which required significant rehabilitation. LKW: Unable to confirm, no answer despite multiple calls to facility, son last saw patient ~2 weeks prior.  tPA given?: No, due to unable to confirm LKW IA performed?: Yes Premorbid modified rankin scale:      3 - Moderate disability. Requires some help, but able to walk unassisted.   HOSPITAL COURSE  Ms. KOLBY MYUNG is a 87 y.o. female with history of atrial fibrillation not on anticoagulation due to frequent falls, baseline dementia, hypertension, hypothyroidism, uterine cancer, prior thrombocytopenia resolved more recently admitted for left-sided weakness and aphasia.   Stroke:  right MCA scattered moderate infarcts with hemorrhagic conversion at right BG due to right MCA occlusion s/p IR with TICI3, embolic secondary to AF not on AC due to fall risk  CT head No acute abnormality.  ASPECTS 10.     CTA head & neck  1. Occlusion of the proximal right M1 MCA. 2. Large area of right MCA territory penumbra (79 mL) without evidence of core infarct. 3. 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 4. Severe left (difficult to quantify but likely greater than 80%) and approximately 70% right proximal ICA stenosis in the neck due to atherosclerosis. MRI   1. Acute right MCA territory infarcts within the right cerebral hemisphere and right basal ganglia, as described. Superimposed petechial hemorrhage within the right basal ganglia (Hiedelberg classifications, HI2). 2. Punctate acute cortical infarct within the medial posterior left frontal lobe.  MRA The M1 right middle cerebral artery remains patent status post mechanical thrombectomy performed  06/24/2021. 2. Atherosclerotic irregularity of the M2 and more distal right MCA vessels. Most notably, a moderate stenosis is present within a superior division proximal M2 right MCA vessel. CT repeat 9/17 Unchanged right MCA distribution infarcts with petechial hemorrhage at the basal ganglia. 2D Echo EF 60 to 65%.  No wall motion abnormalities. LDL 17 HgbA1c 5.3 Palliative care on board, now on comfort care measures per family request   Atrial Fibrillation with RVR Not on  AC prior to admission due to falls  CT repeat 9/17 Unchanged right MCA distribution infarcts with petechial hemorrhage at right BG. Was on amiodarone drip -> off 9/18 On metoprolol 25 bid On digoxin   Respiratory failure Intubated for procedure Extubated -> re-intubated Now re-extubated -> still has significant difficulty with secretions -> NT suctioning -> comfort care measures   Dsyphagia Comfort feeds Cortrak removed   Hypertension Home meds:  Lopressor BP stable   Hyperlipidemia Home meds:  Zocor 20mg ,  resumed in hospital LDL 17, goal < 70   Other Stroke Risk Factors  Advanced Age >/= 6   Hx stroke/TIA  Uterine cancer   Other Active Problems Right elbow blister Left elbow wound - wound care    Hospital day # 20   RN Pressure Injury Documentation: Pressure Injury 01/25/19 Sacrum Mid Stage I -  Intact skin with non-blanchable redness of a localized area usually over a bony prominence. (Active)  01/25/19 1940  Location: Sacrum  Location Orientation: Mid  Staging: Stage I -  Intact skin with non-blanchable redness of a localized area usually over a bony prominence.  Wound Description (Comments):   Present on Admission: Yes     Pressure Injury 01/26/19 Deep Tissue Injury - Purple or maroon localized area of discolored intact skin or blood-filled blister due to damage of underlying soft tissue from pressure and/or shear. (Active)  01/26/19 0400  Location: Coccyx  Location Orientation: Mid   Staging: Deep Tissue Injury - Purple or maroon localized area of discolored intact skin or blood-filled blister due to damage of underlying soft tissue from pressure and/or shear.  Wound Description (Comments):   Present on Admission: Yes     Pressure Injury 06/24/21 Sacrum Left Stage 2 -  Partial thickness loss of dermis presenting as a shallow open injury with a red, pink wound bed without slough. (Active)  06/24/21 2230  Location: Sacrum  Location Orientation: Left  Staging: Stage 2 -  Partial thickness loss of dermis presenting as a shallow open injury with a red, pink wound bed without slough.  Wound Description (Comments):   Present on Admission: Yes    Malnutrition Type:  Nutrition Problem: Severe Malnutrition Etiology: chronic illness (dementia)   Malnutrition Characteristics:  Signs/Symptoms: moderate fat depletion, severe fat depletion, moderate muscle depletion, severe muscle depletion   Nutrition Interventions:  Interventions: MVI, Prostat, Tube feeding    DISCHARGE EXAM Vitals:   07/12/21 1632 07/12/21 2032 07/13/21 0443 07/13/21 2045  BP: 117/71 (!) 149/127 128/68 (!) 149/83  Pulse: 94 (!) 105 (!) 104 (!) 110  Resp: 14 16 19 19   Temp: 99.4 F (37.4 C) 97.7 F (36.5 C) 98.4 F (36.9 C) 98.2 F (36.8 C)  TempSrc: Oral Oral Oral Oral  SpO2: 98% 99% 100% 92%  Weight:      Height:       General - cachectic, frail elderly Caucasian lady. lethargic but eyes open. Right elbow edema with small blisters. Left elbow posterior deep wound on dressing. Gurgling sound in throat   Cardiovascular - regular heart rate and rhythm today, not in afib   Neuro - lethargic, eyes open spontaneously, severe dysarthria with intangible words.  She is not following commands today and is non-verbal. No gaze palsy, intermittently tracking, Left facial droop. No spontaneous movement of all extremities. Limited exam in comfort care/hospice status.   DISCHARGE PLAN Disposition:   Discharge to free standing H[facility] No antithrombotic for secondary stroke prevention. Hospice care at [facility]. Follow-up  [name], MD as needed Follow-up in [facility] as needed.  35 minutes were spent preparing discharge.  [name], MD PhD Stroke Neurology 07/14/2021 2:43 PM

## 2021-08-12 DEATH — deceased
